# Patient Record
Sex: Female | Born: 1980 | Race: White | Hispanic: No | Smoking: Current every day smoker
Health system: Southern US, Community
[De-identification: ages and names within clinical notes are randomized; demographics above are authoritative.]

## PROBLEM LIST (undated history)

## (undated) DIAGNOSIS — E785 Hyperlipidemia, unspecified: Secondary | ICD-10-CM

## (undated) DIAGNOSIS — N920 Excessive and frequent menstruation with regular cycle: Secondary | ICD-10-CM

## (undated) DIAGNOSIS — T7840XA Allergy, unspecified, initial encounter: Secondary | ICD-10-CM

## (undated) DIAGNOSIS — F32A Depression, unspecified: Secondary | ICD-10-CM

## (undated) DIAGNOSIS — J45909 Unspecified asthma, uncomplicated: Secondary | ICD-10-CM

## (undated) DIAGNOSIS — K5792 Diverticulitis of intestine, part unspecified, without perforation or abscess without bleeding: Secondary | ICD-10-CM

## (undated) DIAGNOSIS — A630 Anogenital (venereal) warts: Secondary | ICD-10-CM

## (undated) DIAGNOSIS — F419 Anxiety disorder, unspecified: Secondary | ICD-10-CM

## (undated) DIAGNOSIS — F439 Reaction to severe stress, unspecified: Secondary | ICD-10-CM

## (undated) DIAGNOSIS — K649 Unspecified hemorrhoids: Secondary | ICD-10-CM

## (undated) DIAGNOSIS — Z713 Dietary counseling and surveillance: Secondary | ICD-10-CM

## (undated) DIAGNOSIS — N946 Dysmenorrhea, unspecified: Secondary | ICD-10-CM

## (undated) DIAGNOSIS — N83209 Unspecified ovarian cyst, unspecified side: Secondary | ICD-10-CM

## (undated) DIAGNOSIS — I10 Essential (primary) hypertension: Secondary | ICD-10-CM

## (undated) DIAGNOSIS — E669 Obesity, unspecified: Secondary | ICD-10-CM

## (undated) DIAGNOSIS — M62830 Muscle spasm of back: Secondary | ICD-10-CM

## (undated) DIAGNOSIS — Z6837 Body mass index (BMI) 37.0-37.9, adult: Secondary | ICD-10-CM

## (undated) DIAGNOSIS — S61219A Laceration without foreign body of unspecified finger without damage to nail, initial encounter: Secondary | ICD-10-CM

## (undated) DIAGNOSIS — K449 Diaphragmatic hernia without obstruction or gangrene: Secondary | ICD-10-CM

## (undated) DIAGNOSIS — Z8489 Family history of other specified conditions: Secondary | ICD-10-CM

## (undated) DIAGNOSIS — K219 Gastro-esophageal reflux disease without esophagitis: Secondary | ICD-10-CM

## (undated) DIAGNOSIS — M5431 Sciatica, right side: Secondary | ICD-10-CM

## (undated) DIAGNOSIS — F329 Major depressive disorder, single episode, unspecified: Secondary | ICD-10-CM

## (undated) DIAGNOSIS — Z6836 Body mass index (BMI) 36.0-36.9, adult: Secondary | ICD-10-CM

## (undated) HISTORY — DX: Unspecified ovarian cyst, unspecified side: N83.209

## (undated) HISTORY — DX: Excessive and frequent menstruation with regular cycle: N92.0

## (undated) HISTORY — DX: Dysmenorrhea, unspecified: N94.6

## (undated) HISTORY — DX: Muscle spasm of back: M62.830

## (undated) HISTORY — PX: TUBAL LIGATION: SHX77

## (undated) HISTORY — DX: Major depressive disorder, single episode, unspecified: F32.9

## (undated) HISTORY — DX: Dietary counseling and surveillance: Z71.3

## (undated) HISTORY — DX: Reaction to severe stress, unspecified: F43.9

## (undated) HISTORY — DX: Body mass index (bmi) 36.0-36.9, adult: Z68.36

## (undated) HISTORY — PX: ESOPHAGOGASTRODUODENOSCOPY ENDOSCOPY: SHX5814

## (undated) HISTORY — DX: Hyperlipidemia, unspecified: E78.5

## (undated) HISTORY — PX: COLONOSCOPY: SHX174

## (undated) HISTORY — DX: Gastro-esophageal reflux disease without esophagitis: K21.9

## (undated) HISTORY — DX: Allergy, unspecified, initial encounter: T78.40XA

## (undated) HISTORY — DX: Obesity, unspecified: E66.9

## (undated) HISTORY — DX: Anxiety disorder, unspecified: F41.9

## (undated) HISTORY — DX: Unspecified hemorrhoids: K64.9

## (undated) HISTORY — DX: Anogenital (venereal) warts: A63.0

## (undated) HISTORY — DX: Sciatica, right side: M54.31

## (undated) HISTORY — PX: ABLATION: SHX5711

## (undated) HISTORY — DX: Body mass index (bmi) 37.0-37.9, adult: Z68.37

## (undated) HISTORY — PX: CHOLECYSTECTOMY: SHX55

## (undated) HISTORY — DX: Depression, unspecified: F32.A

---

## 2003-08-16 DIAGNOSIS — J309 Allergic rhinitis, unspecified: Secondary | ICD-10-CM | POA: Insufficient documentation

## 2007-03-27 DIAGNOSIS — F3289 Other specified depressive episodes: Secondary | ICD-10-CM | POA: Insufficient documentation

## 2007-03-27 DIAGNOSIS — F329 Major depressive disorder, single episode, unspecified: Secondary | ICD-10-CM

## 2007-03-27 HISTORY — DX: Other specified depressive episodes: F32.89

## 2007-03-27 HISTORY — DX: Major depressive disorder, single episode, unspecified: F32.9

## 2011-04-09 DIAGNOSIS — E785 Hyperlipidemia, unspecified: Secondary | ICD-10-CM

## 2011-04-09 HISTORY — DX: Hyperlipidemia, unspecified: E78.5

## 2013-04-05 DIAGNOSIS — J4530 Mild persistent asthma, uncomplicated: Secondary | ICD-10-CM | POA: Insufficient documentation

## 2013-07-07 ENCOUNTER — Encounter (HOSPITAL_COMMUNITY): Payer: Self-pay | Admitting: Emergency Medicine

## 2013-07-07 ENCOUNTER — Emergency Department (HOSPITAL_COMMUNITY): Payer: Medicaid - Out of State

## 2013-07-07 ENCOUNTER — Emergency Department (HOSPITAL_COMMUNITY)
Admission: EM | Admit: 2013-07-07 | Discharge: 2013-07-07 | Disposition: A | Discharge status: Home / Self Care | Payer: Medicaid - Out of State | Attending: Emergency Medicine | Admitting: Emergency Medicine

## 2013-07-07 DIAGNOSIS — R0789 Other chest pain: Secondary | ICD-10-CM | POA: Insufficient documentation

## 2013-07-07 DIAGNOSIS — J029 Acute pharyngitis, unspecified: Secondary | ICD-10-CM | POA: Insufficient documentation

## 2013-07-07 DIAGNOSIS — R059 Cough, unspecified: Secondary | ICD-10-CM | POA: Insufficient documentation

## 2013-07-07 DIAGNOSIS — F17200 Nicotine dependence, unspecified, uncomplicated: Secondary | ICD-10-CM | POA: Insufficient documentation

## 2013-07-07 DIAGNOSIS — I10 Essential (primary) hypertension: Secondary | ICD-10-CM | POA: Insufficient documentation

## 2013-07-07 DIAGNOSIS — J3489 Other specified disorders of nose and nasal sinuses: Secondary | ICD-10-CM | POA: Insufficient documentation

## 2013-07-07 DIAGNOSIS — R05 Cough: Secondary | ICD-10-CM | POA: Insufficient documentation

## 2013-07-07 DIAGNOSIS — R5381 Other malaise: Secondary | ICD-10-CM | POA: Insufficient documentation

## 2013-07-07 DIAGNOSIS — R509 Fever, unspecified: Secondary | ICD-10-CM | POA: Insufficient documentation

## 2013-07-07 DIAGNOSIS — R6889 Other general symptoms and signs: Secondary | ICD-10-CM | POA: Insufficient documentation

## 2013-07-07 DIAGNOSIS — J45901 Unspecified asthma with (acute) exacerbation: Secondary | ICD-10-CM | POA: Insufficient documentation

## 2013-07-07 DIAGNOSIS — Z79899 Other long term (current) drug therapy: Secondary | ICD-10-CM | POA: Insufficient documentation

## 2013-07-07 HISTORY — DX: Essential (primary) hypertension: I10

## 2013-07-07 HISTORY — DX: Unspecified asthma, uncomplicated: J45.909

## 2013-07-07 MED ORDER — ALBUTEROL SULFATE (5 MG/ML) 0.5% IN NEBU
5.0000 mg | INHALATION_SOLUTION | Freq: Once | RESPIRATORY_TRACT | Status: AC
  Administered 2013-07-07: 5 mg via RESPIRATORY_TRACT
  Filled 2013-07-07: qty 1

## 2013-07-07 MED ORDER — ALBUTEROL SULFATE (5 MG/ML) 0.5% IN NEBU
10.0000 mg | INHALATION_SOLUTION | Freq: Once | RESPIRATORY_TRACT | Status: AC
  Administered 2013-07-07: 10 mg via RESPIRATORY_TRACT

## 2013-07-07 MED ORDER — ALBUTEROL SULFATE (5 MG/ML) 0.5% IN NEBU
INHALATION_SOLUTION | RESPIRATORY_TRACT | Status: AC
  Filled 2013-07-07: qty 2

## 2013-07-07 MED ORDER — ALBUTEROL SULFATE HFA 108 (90 BASE) MCG/ACT IN AERS: 2.0000 | INHALATION_SPRAY | Freq: Once | RESPIRATORY_TRACT | Status: DC

## 2013-07-07 MED ORDER — HYDROCOD POLST-CHLORPHEN POLST 10-8 MG/5ML PO LQCR
5.0000 mL | Freq: Once | ORAL | Status: AC
  Administered 2013-07-07: 5 mL via ORAL
  Filled 2013-07-07: qty 5

## 2013-07-07 MED ORDER — ALBUTEROL SULFATE HFA 108 (90 BASE) MCG/ACT IN AERS: 1.0000 | INHALATION_SPRAY | Freq: Four times a day (QID) | RESPIRATORY_TRACT | Status: DC | PRN

## 2013-07-07 MED ORDER — PREDNISONE 10 MG PO TABS: ORAL_TABLET | ORAL | Status: DC

## 2013-07-07 MED ORDER — PREDNISONE 50 MG PO TABS
60.0000 mg | ORAL_TABLET | Freq: Once | ORAL | Status: AC
  Administered 2013-07-07: 60 mg via ORAL
  Filled 2013-07-07: qty 1

## 2013-07-07 MED ORDER — IPRATROPIUM BROMIDE 0.02 % IN SOLN
0.5000 mg | Freq: Once | RESPIRATORY_TRACT | Status: AC
  Administered 2013-07-07: 0.5 mg via RESPIRATORY_TRACT
  Filled 2013-07-07: qty 2.5

## 2013-07-07 MED ORDER — HYDROCODONE-ACETAMINOPHEN 5-325 MG PO TABS: 1.0000 | ORAL_TABLET | ORAL | Status: DC | PRN

## 2013-07-07 NOTE — ED Notes (Signed)
Pt c/o sob and dry cough x 3-4 days now. Chills/fever yesterday. Nad. Insp/exp wheezing noted throughout. No resp distress noted.

## 2013-07-07 NOTE — ED Notes (Signed)
O2 sat with ambulation 96%.

## 2013-07-07 NOTE — ED Provider Notes (Signed)
CSN: [id number]     Arrival date & time 07/07/13  1613 History     First MD Initiated Contact with Patient 07/07/13 1628     Chief Complaint  Patient presents with  . Shortness of Breath   (Consider location/radiation/quality/duration/timing/severity/associated sxs/prior Treatment) HPI Comments: Destiny Petty is a 32 y.o. Female with a 3 day history of increased wheezing,  Shortness of breath,  Fever to 102.1 last night which improved after taking dayquill,  And cough which has been productive of a clear to sometimes yellow tinged sputum.  She has a history of asthma for which she uses daily qvar and another inhaler she cannot name.  She haas moved here from [place] and has not yet established pcp.  Her symptoms started with uri type symptoms including nasal congestion with clear rhinorrhea and mild sore throat and sneezing.  She is a smoker but has only been able to smoke 1 cigarette per day since she became ill.     The history is provided by the patient.    Past Medical History  Diagnosis Date  . Asthma   . Hypertension    Past Surgical History  Procedure Laterality Date  . Cesarean section    . Tubal ligation     History reviewed. No pertinent family history. History  Substance Use Topics  . Smoking status: Current Every Day Smoker  . Smokeless tobacco: Not on file  . Alcohol Use: No   OB History   Grav Para Term Preterm Abortions TAB SAB Ect Mult Living                 Review of Systems  Constitutional: Positive for fever, chills and fatigue.  HENT: Positive for congestion, sore throat and rhinorrhea. Negative for ear pain, neck pain and sinus pressure.   Eyes: Negative.   Respiratory: Positive for cough, chest tightness, shortness of breath and wheezing.   Cardiovascular: Negative for palpitations and leg swelling.  Gastrointestinal: Negative for nausea and abdominal pain.  Genitourinary: Negative.   Musculoskeletal: Negative for joint swelling and arthralgias.   Skin: Negative.  Negative for rash and wound.  Neurological: Negative for dizziness, weakness, light-headedness, numbness and headaches.  Psychiatric/Behavioral: Negative.     Allergies  Lisinopril  Home Medications   Current Outpatient Rx  Name  Route  Sig  Dispense  Refill  . albuterol (PROVENTIL HFA;VENTOLIN HFA) 108 (90 BASE) MCG/ACT inhaler   Inhalation   Inhale 1-2 puffs into the lungs every 6 (six) hours as needed for wheezing.   1 Inhaler   0   . HYDROcodone-acetaminophen (NORCO/VICODIN) 5-325 MG per tablet   Oral   Take 1 tablet by mouth every 4 (four) hours as needed (for cough suppression).   15 tablet   0   . predniSONE (DELTASONE) 10 MG tablet      6, 5, 4, 3, 2 then 1 tablet by mouth daily for 6 days total.   21 tablet   0    BP 114/89  Pulse 101  Temp(Src) 97.8 F (36.6 C) (Oral)  Resp 20  Ht 5\' 2"  (1.575 m)  Wt 240 lb (108.863 kg)  BMI 43.89 kg/m2  SpO2 100%  LMP 07/07/2013 Physical Exam  Nursing note and vitals reviewed. Constitutional: She is oriented to person, place, and time. She appears well-developed and well-nourished. No distress.  HENT:  Head: Normocephalic and atraumatic.  Eyes: Conjunctivae are normal.  Neck: Normal range of motion.  Cardiovascular: Normal rate, regular rhythm, normal heart  sounds and intact distal pulses.   Pulmonary/Chest: Effort normal. No respiratory distress. She has wheezes.  Inspiratory and expiratory wheeze throughout all lung fields.  Prolonged respirations.  Abdominal: Soft. Bowel sounds are normal. There is no tenderness. There is no rebound and no guarding.  Musculoskeletal: Normal range of motion.  Neurological: She is alert and oriented to person, place, and time.  Skin: Skin is warm and dry.  Psychiatric: She has a normal mood and affect.    ED Course   Procedures (including critical care time)   Medications  albuterol (PROVENTIL) (5 MG/ML) 0.5% nebulizer solution 5 mg (5 mg Nebulization  Given 07/07/13 1633)  ipratropium (ATROVENT) nebulizer solution 0.5 mg (0.5 mg Nebulization Given 07/07/13 1633)  predniSONE (DELTASONE) tablet 60 mg (60 mg Oral Given 07/07/13 1643)  albuterol (PROVENTIL) (5 MG/ML) 0.5% nebulizer solution 10 mg (10 mg Nebulization Given 07/07/13 1646)  albuterol (PROVENTIL) (5 MG/ML) 0.5% nebulizer solution (  Duplicate 07/07/13 1651)  chlorpheniramine-HYDROcodone (TUSSIONEX) 10-8 MG/5ML suspension 5 mL (5 mLs Oral Given 07/07/13 1805)   Pt was given albuterol 5 mg/atrovent 0.5 mg neb without improvement in aeration and wheezing. Prednisone 60 mg po also given.  Repeated albuterol 10 mg over 1 hour.  Pt with significant improvement after this second dose of neb albuterol - she ambulated in ed without sob or desaturation.  Wheezing almost resolved with increased aeration throughout all lung fields.     Labs Reviewed - No data to display Dg Chest 2 View  07/07/2013   *RADIOLOGY REPORT*  Clinical Data: Shortness of breath.  CHEST - 2 VIEW  Comparison: None.  Findings: Lungs are somewhat hypoinflated without consolidation or effusion.  The cardiomediastinal silhouette, bones and soft tissues are unremarkable.  IMPRESSION: No acute cardiopulmonary disease.   Original Report Authenticated By: Elberta Fortis, M.D.   1. Asthma attack     MDM  Wheezing almost resolved with increased aeration throughout all lung fields.  She felt comfortable  for dc home. She was advised return here immediately for any worsened sx.  Prescribed albuterol mdi,  Prednisone 6 day taper with next dose taken tomorrow.  Also prescribed hydrocodone for cough relief, was given dose of tussionex in ed with improvement in cough, but this is not covered by her insurance.   Patients labs and/or radiological studies were viewed and considered during the medical decision making and disposition process.  No pneumonia per xray - no indication for abx needed at this time.     [name], PA-C 07/08/13 0106

## 2013-07-07 NOTE — ED Notes (Signed)
Pt seen and evaluated by EDPa for initial assessment. 

## 2013-07-08 ENCOUNTER — Encounter (HOSPITAL_COMMUNITY): Payer: Self-pay

## 2013-07-08 ENCOUNTER — Inpatient Hospital Stay (HOSPITAL_COMMUNITY)
Admission: EM | Admit: 2013-07-08 | Discharge: 2013-07-10 | DRG: 203 | Disposition: A | Discharge status: Home / Self Care | Payer: Medicaid Other | Attending: Internal Medicine | Admitting: Internal Medicine

## 2013-07-08 DIAGNOSIS — J4541 Moderate persistent asthma with (acute) exacerbation: Secondary | ICD-10-CM

## 2013-07-08 DIAGNOSIS — R0602 Shortness of breath: Secondary | ICD-10-CM | POA: Diagnosis present

## 2013-07-08 DIAGNOSIS — Z23 Encounter for immunization: Secondary | ICD-10-CM

## 2013-07-08 DIAGNOSIS — Z720 Tobacco use: Secondary | ICD-10-CM | POA: Diagnosis present

## 2013-07-08 DIAGNOSIS — J45901 Unspecified asthma with (acute) exacerbation: Principal | ICD-10-CM | POA: Diagnosis present

## 2013-07-08 DIAGNOSIS — I10 Essential (primary) hypertension: Secondary | ICD-10-CM | POA: Diagnosis present

## 2013-07-08 DIAGNOSIS — J45902 Unspecified asthma with status asthmaticus: Secondary | ICD-10-CM

## 2013-07-08 DIAGNOSIS — K219 Gastro-esophageal reflux disease without esophagitis: Secondary | ICD-10-CM | POA: Diagnosis present

## 2013-07-08 DIAGNOSIS — F17200 Nicotine dependence, unspecified, uncomplicated: Secondary | ICD-10-CM | POA: Diagnosis present

## 2013-07-08 LAB — CBC WITH DIFFERENTIAL/PLATELET
Basophils Absolute: 0 10*3/uL (ref 0.0–0.1)
Basophils Relative: 0 % (ref 0–1)
Eosinophils Absolute: 0 10*3/uL (ref 0.0–0.7)
Eosinophils Relative: 0 % (ref 0–5)
HCT: 42.1 % (ref 36.0–46.0)
Hemoglobin: 14.5 g/dL (ref 12.0–15.0)
Lymphocytes Relative: 9 % — ABNORMAL LOW (ref 12–46)
Lymphs Abs: 1 10*3/uL (ref 0.7–4.0)
MCH: 32.7 pg (ref 26.0–34.0)
MCHC: 34.4 g/dL (ref 30.0–36.0)
MCV: 94.8 fL (ref 78.0–100.0)
Monocytes Absolute: 0.4 10*3/uL (ref 0.1–1.0)
Monocytes Relative: 3 % (ref 3–12)
Neutro Abs: 10.4 10*3/uL — ABNORMAL HIGH (ref 1.7–7.7)
Neutrophils Relative %: 88 % — ABNORMAL HIGH (ref 43–77)
Platelets: 224 10*3/uL (ref 150–400)
RBC: 4.44 MIL/uL (ref 3.87–5.11)
RDW: 12.9 % (ref 11.5–15.5)
WBC: 11.8 10*3/uL — ABNORMAL HIGH (ref 4.0–10.5)

## 2013-07-08 LAB — BASIC METABOLIC PANEL
BUN: 13 mg/dL (ref 6–23)
CO2: 19 mEq/L (ref 19–32)
Calcium: 9.1 mg/dL (ref 8.4–10.5)
Chloride: 107 mEq/L (ref 96–112)
Creatinine, Ser: 0.65 mg/dL (ref 0.50–1.10)
GFR calc Af Amer: >90 mL/min (ref >90)
GFR calc non Af Amer: >90 mL/min (ref >90)
Glucose, Bld: 113 mg/dL — ABNORMAL HIGH (ref 70–99)
Potassium: 3.4 mEq/L — ABNORMAL LOW (ref 3.5–5.1)
Sodium: 139 mEq/L (ref 135–145)

## 2013-07-08 MED ORDER — FLUTICASONE PROPIONATE 50 MCG/ACT NA SUSP
NASAL | Status: AC
  Filled 2013-07-08: qty 16

## 2013-07-08 MED ORDER — IPRATROPIUM BROMIDE 0.02 % IN SOLN
0.5000 mg | RESPIRATORY_TRACT | Status: DC
  Administered 2013-07-08 – 2013-07-10 (×9): 0.5 mg via RESPIRATORY_TRACT
  Filled 2013-07-08 (×9): qty 2.5

## 2013-07-08 MED ORDER — ALBUTEROL (5 MG/ML) CONTINUOUS INHALATION SOLN
15.0000 mg/h | INHALATION_SOLUTION | RESPIRATORY_TRACT | Status: DC
  Administered 2013-07-08: 15 mg/h via RESPIRATORY_TRACT
  Filled 2013-07-08: qty 20

## 2013-07-08 MED ORDER — PREDNISONE 20 MG PO TABS
50.0000 mg | ORAL_TABLET | Freq: Every day | ORAL | Status: DC
  Administered 2013-07-09: 50 mg via ORAL
  Filled 2013-07-08: qty 2

## 2013-07-08 MED ORDER — ALBUTEROL SULFATE (5 MG/ML) 0.5% IN NEBU
2.5000 mg | INHALATION_SOLUTION | RESPIRATORY_TRACT | Status: DC
  Administered 2013-07-08 – 2013-07-10 (×9): 2.5 mg via RESPIRATORY_TRACT
  Filled 2013-07-08 (×7): qty 0.5

## 2013-07-08 MED ORDER — PREDNISONE 50 MG PO TABS
60.0000 mg | ORAL_TABLET | Freq: Once | ORAL | Status: AC
  Administered 2013-07-08: 60 mg via ORAL
  Filled 2013-07-08: qty 1

## 2013-07-08 MED ORDER — SODIUM CHLORIDE 0.9 % IV SOLN
INTRAVENOUS | Status: DC
  Administered 2013-07-08: 21:00:00 via INTRAVENOUS

## 2013-07-08 MED ORDER — ACETAMINOPHEN 325 MG PO TABS
650.0000 mg | ORAL_TABLET | Freq: Four times a day (QID) | ORAL | Status: DC | PRN
  Administered 2013-07-08 – 2013-07-09 (×4): 650 mg via ORAL
  Filled 2013-07-08 (×4): qty 2

## 2013-07-08 MED ORDER — ALBUTEROL SULFATE (5 MG/ML) 0.5% IN NEBU
5.0000 mg | INHALATION_SOLUTION | Freq: Once | RESPIRATORY_TRACT | Status: AC
  Administered 2013-07-08: 5 mg via RESPIRATORY_TRACT
  Filled 2013-07-08: qty 1

## 2013-07-08 MED ORDER — ENOXAPARIN SODIUM 40 MG/0.4ML ~~LOC~~ SOLN
40.0000 mg | SUBCUTANEOUS | Status: DC
  Administered 2013-07-08 – 2013-07-09 (×2): 40 mg via SUBCUTANEOUS
  Filled 2013-07-08 (×2): qty 0.4

## 2013-07-08 MED ORDER — IPRATROPIUM BROMIDE 0.02 % IN SOLN
1.0000 mg | Freq: Once | RESPIRATORY_TRACT | Status: AC
  Administered 2013-07-08: 1 mg via RESPIRATORY_TRACT
  Filled 2013-07-08: qty 5

## 2013-07-08 MED ORDER — DM-GUAIFENESIN ER 30-600 MG PO TB12
1.0000 | ORAL_TABLET | Freq: Two times a day (BID) | ORAL | Status: DC
  Administered 2013-07-08: 1 via ORAL
  Filled 2013-07-08: qty 1

## 2013-07-08 MED ORDER — FLUTICASONE PROPIONATE 50 MCG/ACT NA SUSP
2.0000 | Freq: Every day | NASAL | Status: DC | PRN
Start: 2013-07-08 — End: 2013-07-10
  Filled 2013-07-08: qty 16

## 2013-07-08 MED ORDER — PNEUMOCOCCAL VAC POLYVALENT 25 MCG/0.5ML IJ INJ
0.5000 mL | INJECTION | INTRAMUSCULAR | Status: AC
  Administered 2013-07-09: 0.5 mL via INTRAMUSCULAR
  Filled 2013-07-08: qty 0.5

## 2013-07-08 MED ORDER — ALBUTEROL SULFATE (5 MG/ML) 0.5% IN NEBU: 2.5000 mg | INHALATION_SOLUTION | RESPIRATORY_TRACT | Status: DC | PRN

## 2013-07-08 MED ORDER — LOSARTAN POTASSIUM 50 MG PO TABS
100.0000 mg | ORAL_TABLET | Freq: Every day | ORAL | Status: DC
  Administered 2013-07-08 – 2013-07-10 (×3): 100 mg via ORAL
  Filled 2013-07-08: qty 2
  Filled 2013-07-08 (×2): qty 1
  Filled 2013-07-08: qty 2

## 2013-07-08 NOTE — ED Notes (Signed)
Pt reports was seen here last night for asthma.  Pt says was told she could be admitted but pt didn't want to stay because she had to work today.  Pt reports became very sob at work and had to leave.  Pt has been SOb x 4 days and nonproductive cough.  Pt has been using inhaler without relief.

## 2013-07-08 NOTE — ED Notes (Signed)
While ambulating in hallway pt states breathing became more difficult. Pt's O2 sat dropped to 89%. Dr. Juleen China witnessed episode.  Pt has agreed to stay in hospital.

## 2013-07-08 NOTE — H&P (Signed)
PCP:   No primary provider on file.   Chief Complaint:  Shortness of breath  HPI:  32 -year-old female with a history of asthma came to the ED with worsening of shortness of breath. Patient was seen in the ED yesterday and was sent home today came to the hospital as patient could not breathe at work she was getting short of breath on walking. Also complains of cough but no phlegm. Patient had fever of 102 2 days ago. Chest x-ray done yesterday was negative for pneumonia. She denies fever today. She has had some diarrhea but no nausea vomiting. Does complain of some abdominal pain due to coughing. Denies dysuria urgency or frequency of urination  Allergies:   Allergies  Allergen Reactions  . Lisinopril Other (See Comments)    Cough      Past Medical History  Diagnosis Date  . Asthma   . Hypertension     Past Surgical History  Procedure Laterality Date  . Cesarean section    . Tubal ligation      Prior to Admission medications   Medication Sig Start Date End Date Taking? Authorizing Provider  albuterol (PROVENTIL HFA;VENTOLIN HFA) 108 (90 BASE) MCG/ACT inhaler Inhale 1-2 puffs into the lungs every 6 (six) hours as needed for wheezing. 07/07/13  Yes Raynelle Fanning Idol, PA-C  fluticasone (FLONASE) 50 MCG/ACT nasal spray Place 2 sprays into the nose daily as needed for rhinitis.   Yes Historical Provider, MD  losartan (COZAAR) 100 MG tablet Take 100 mg by mouth daily.   Yes Historical Provider, MD    Social History:  reports that she has been smoking.  She does not have any smokeless tobacco history on file. She reports that she does not drink alcohol or use illicit drugs.   family history: Noncontributory  All the positives are listed in BOLD  Review of Systems:  HEENT: Headache, blurred vision, runny nose, sore throat Neck: Hypothyroidism, hyperthyroidism,,lymphadenopathy Chest : Shortness of breath, history of COPD, Asthma Heart : Chest pain, history of coronary arterey disease,  hypertension  GI:  Nausea, vomiting, diarrhea, constipation, GERD GU: Dysuria, urgency, frequency of urination, hematuria Neuro: Stroke, seizures, syncope Psych: Depression, anxiety, hallucinations   Physical Exam: Blood pressure 145/74, pulse 82, temperature 97.6 F (36.4 C), temperature source Oral, resp. rate 20, height 5\' 2"  (1.575 m), weight 108.863 kg (240 lb), last menstrual period 07/07/2013, SpO2 94.00%. Constitutional:   Patient is a well-developed and well-nourished female* in no acute distress and cooperative with exam. Head: Normocephalic and atraumatic Mouth: Mucus membranes moist Eyes: PERRL, EOMI, conjunctivae normal Neck: Supple, No Thyromegaly Cardiovascular: RRR, S1 normal, S2 normal Pulmonary/Chest: bilateral wheezing auscultated  Abdominal: Soft. Non-tender, non-distended, bowel sounds are normal, no masses, organomegaly, or guarding present.  Neurological: A&O x3, Strenght is normal and symmetric bilaterally, cranial nerve II-XII are grossly intact, no focal motor deficit, sensory intact to light touch bilaterally.  Extremities : No Cyanosis, Clubbing or Edema   Labs on Admission:  No results found for this or any previous visit (from the past 48 hour(s)).  Radiological Exams on Admission: Dg Chest 2 View  07/07/2013   *RADIOLOGY REPORT*  Clinical Data: Shortness of breath.  CHEST - 2 VIEW  Comparison: None.  Findings: Lungs are somewhat hypoinflated without consolidation or effusion.  The cardiomediastinal silhouette, bones and soft tissues are unremarkable.  IMPRESSION: No acute cardiopulmonary disease.   Original Report Authenticated By: Elberta Fortis, M.D.    Assessment/Plan Active Problems:   Asthma exacerbation  HTN (hypertension)  Asthma exacerbation Patient has received prednisone 60 mg in the ED. Will start prednisone 50 mg from tomorrow morning. DuoNeb nebulizers every 4 hours scheduled. Albuterol when necessary  q. 2Hours when  necessary  Hypertension Continue losartan  DVT prophylaxis Lovenox  Code status: Full code  Family discussion: no family at bedside   Time Spent on Admission: 65 min Alverto Shedd S [facility] Pager: [phone] 07/08/2013, 7:25 PM  If 7PM-7AM, please contact night-coverage  [email or web address]  Password [email or web address]

## 2013-07-08 NOTE — ED Notes (Signed)
Pt c/o difficulty breathing and has a hx of asthma. Pt was seen here yesterday and was recommended to stay in hopsital overnight due to asthma exacerbation. Pt was unable to stay due to work. However, pt is back today due to worsening symptoms which began while at work. Pt states "my breathing got so bad I though I was going to pass out".

## 2013-07-08 NOTE — ED Provider Notes (Signed)
CSN: [id number]     Arrival date & time 07/08/13  1505 History  This chart was scribed for [name], MD by Bennett Scrape, ED Scribe. This patient was seen in room [facility]15[facility] and the patient's care was started at 3:32 PM.   First MD Initiated Contact with Patient 07/08/13 1523     Chief Complaint  Patient presents with  . Shortness of Breath    The history is provided by the patient. No language interpreter was used.    HPI Comments: Destiny Petty is a 32 y.o. female with a h/o asthma who presents to the Emergency Department complaining of 4 days of gradual onset, gradually worsening SOB described as "gasping for air" with associated cough, chest tightness and wheezing. that started this morning. She was seen last night for the same and discharged with improved symptoms after a breathing treatment. CXR performed yesterday was negative. She states that admission was discussed, but she declined due to starting a new job. She reports that she is here for evaluation, because the symptoms returned this morning. She has a fever of 102.1 yesterday, but temperature in the ED today is normal. She reports that she has tried using her inhaler with no improvement. She denies having a nebulizer at home. She was prescribed steroids and antibiotics yesterday and states that she has filled them but has not taken any doses yet. She denies CP, leg swelling or pain as associated symptoms. She denies having a h/o PE/DVTs.  Past Medical History  Diagnosis Date  . Asthma   . Hypertension    Past Surgical History  Procedure Laterality Date  . Cesarean section    . Tubal ligation     No family history on file. History  Substance Use Topics  . Smoking status: Current Every Day Smoker  . Smokeless tobacco: Not on file  . Alcohol Use: No   No OB history provided.  Review of Systems  Constitutional: Negative for fever.  HENT: Positive for sore throat (started today from coughing). Negative for ear  pain.   Respiratory: Positive for cough, chest tightness, shortness of breath and wheezing.   Cardiovascular: Negative for chest pain and leg swelling.  All other systems reviewed and are negative.    Allergies  Lisinopril  Home Medications   Current Outpatient Rx  Name  Route  Sig  Dispense  Refill  . albuterol (PROVENTIL HFA;VENTOLIN HFA) 108 (90 BASE) MCG/ACT inhaler   Inhalation   Inhale 1-2 puffs into the lungs every 6 (six) hours as needed for wheezing.   1 Inhaler   0   . fluticasone (FLONASE) 50 MCG/ACT nasal spray   Nasal   Place 2 sprays into the nose daily as needed for rhinitis.          Triage Vitals: BP 145/74  Pulse 82  Temp(Src) 97.6 F (36.4 C) (Oral)  Resp 20  Ht 5\' 2"  (1.575 m)  Wt 240 lb (108.863 kg)  BMI 43.89 kg/m2  SpO2 96%  LMP 07/07/2013  Physical Exam  Nursing note and vitals reviewed. Constitutional: She is oriented to person, place, and time. She appears well-developed and well-nourished. No distress.  HENT:  Head: Normocephalic and atraumatic.  Eyes: Conjunctivae and EOM are normal.  Neck: Normal range of motion. Neck supple. No tracheal deviation present.  Cardiovascular: Normal rate, regular rhythm and normal heart sounds.   No murmur heard. Pulmonary/Chest: Effort normal. No respiratory distress. She has wheezes (diffuse expiratory wheezing). She has no rales.  Coarse sounding cough, mild tachypnea  Abdominal: Soft. Bowel sounds are normal. There is no tenderness.  Musculoskeletal: Normal range of motion. She exhibits no edema (no ankle swelling) and no tenderness (no calf tenderness).  Neurological: She is alert and oriented to person, place, and time. No cranial nerve deficit.  Skin: Skin is warm and dry.  Psychiatric: She has a normal mood and affect. Her behavior is normal.    ED Course   Procedures (including critical care time)  Medications  albuterol (PROVENTIL,VENTOLIN) solution continuous neb (15 mg/hr Nebulization  New Bag/Given 07/08/13 1549)  ipratropium (ATROVENT) nebulizer solution 1 mg (1 mg Nebulization Given 07/08/13 1550)  predniSONE (DELTASONE) tablet 60 mg (60 mg Oral Given 07/08/13 1541)  albuterol (PROVENTIL) (5 MG/ML) 0.5% nebulizer solution 5 mg (5 mg Nebulization Given 07/08/13 1840)    DIAGNOSTIC STUDIES: Oxygen Saturation is 96% on room air, normal by my interpretation.    COORDINATION OF CARE: 3:39 PM-Discussed treatment plan which includes breathing treatement with pt at bedside and pt agreed to plan.   Labs Reviewed - No data to display  Dg Chest 2 View  07/07/2013   *RADIOLOGY REPORT*  Clinical Data: Shortness of breath.  CHEST - 2 VIEW  Comparison: None.  Findings: Lungs are somewhat hypoinflated without consolidation or effusion.  The cardiomediastinal silhouette, bones and soft tissues are unremarkable.  IMPRESSION: No acute cardiopulmonary disease.   Original Report Authenticated By: Elberta Fortis, M.D.   1. Status asthmaticus, unspecified asthma severity     MDM  31yF with acute bronchospasm. Continued significant symptoms and desat to 89% when ambulated after 15mg  albuterol and 1mg  atrovent. Repeat visit for same symptoms and has already had dose of steroids yesterday. CXR from yesterday reviewed. No acute process. W/o acute change in symptoms, repeat CXR was deferred. Given steroids. More nebs.  Will obtain basic labs. Admit for further tx.   I personally preformed the services scribed in my presence. The recorded information has been reviewed is accurate. [name], MD.    [name], MD 07/13/13 [phone]

## 2013-07-09 DIAGNOSIS — I10 Essential (primary) hypertension: Secondary | ICD-10-CM

## 2013-07-09 DIAGNOSIS — J45902 Unspecified asthma with status asthmaticus: Secondary | ICD-10-CM

## 2013-07-09 LAB — CBC
MCHC: 33.7 g/dL (ref 30.0–36.0)
Platelets: 237 10*3/uL (ref 150–400)
RDW: 13 % (ref 11.5–15.5)

## 2013-07-09 LAB — COMPREHENSIVE METABOLIC PANEL
ALT: 13 U/L (ref 0–35)
Albumin: 3.4 g/dL — ABNORMAL LOW (ref 3.5–5.2)
Alkaline Phosphatase: 84 U/L (ref 39–117)
Potassium: 4.1 mEq/L (ref 3.5–5.1)
Sodium: 140 mEq/L (ref 135–145)
Total Protein: 6.9 g/dL (ref 6.0–8.3)

## 2013-07-09 MED ORDER — GUAIFENESIN-DM 100-10 MG/5ML PO SYRP
5.0000 mL | ORAL_SOLUTION | ORAL | Status: DC | PRN
  Administered 2013-07-09 – 2013-07-10 (×5): 5 mL via ORAL
  Filled 2013-07-09 (×5): qty 5

## 2013-07-09 MED ORDER — BENZONATATE 100 MG PO CAPS
200.0000 mg | ORAL_CAPSULE | Freq: Three times a day (TID) | ORAL | Status: DC
  Administered 2013-07-09 – 2013-07-10 (×3): 200 mg via ORAL
  Filled 2013-07-09: qty 2
  Filled 2013-07-09: qty 1
  Filled 2013-07-09 (×2): qty 2

## 2013-07-09 MED ORDER — IBUPROFEN 800 MG PO TABS
800.0000 mg | ORAL_TABLET | Freq: Three times a day (TID) | ORAL | Status: DC | PRN
  Administered 2013-07-09 – 2013-07-10 (×2): 800 mg via ORAL
  Filled 2013-07-09 (×2): qty 1

## 2013-07-09 MED ORDER — METHYLPREDNISOLONE SODIUM SUCC 125 MG IJ SOLR
60.0000 mg | Freq: Three times a day (TID) | INTRAMUSCULAR | Status: DC
  Administered 2013-07-09 – 2013-07-10 (×3): 60 mg via INTRAVENOUS
  Filled 2013-07-09 (×3): qty 2

## 2013-07-09 NOTE — Progress Notes (Signed)
UR Chart Review Completed  

## 2013-07-09 NOTE — Progress Notes (Signed)
Notified Dr. Kerry Hough due to the pts c/o a cough that has not been relieved by the ordered meds.  New meds ordered and followed.

## 2013-07-09 NOTE — Care Management Note (Unsigned)
    Page 1 of 1   07/09/2013     2:12:15 PM   CARE MANAGEMENT NOTE 07/09/2013  Patient:  [facility]   Account Number:  [id number]  Date Initiated:  07/09/2013  Documentation initiated by:  Sharrie Rothman  Subjective/Objective Assessment:   Pt admitted from home with asthma. Pt just relocated to [place] from [place] and is living with her sister. Pt needs PCP once Medicaid has been moved from [place] to [place].     Action/Plan:   CM gave pt list of PCP and numbers to call once Medicaid has been transferred.Will continue to follow for CM needs.   Anticipated DC Date:  07/12/2013   Anticipated DC Plan:  HOME/SELF CARE      DC Planning Services  CM consult      Choice offered to / List presented to:             Status of service:  Completed, signed off Medicare Important Message given?   (If response is "NO", the following Medicare IM given date fields will be blank) Date Medicare IM given:   Date Additional Medicare IM given:    Discharge Disposition:    Per UR Regulation:    If discussed at Long Length of Stay Meetings, dates discussed:    Comments:  07/09/13 1410 Arlyss Queen, RN BSN CM

## 2013-07-09 NOTE — Progress Notes (Signed)
Notified Dr. Kerry Hough that the patient had been ambulated approx 250 feet (her room and around the nursing station) and stated that she was slightly weak and still wheezing mildly.  I asked her if she felt well enough to go home and she said she did. She verbalized that she wanted to go home. MD verbalized understanding.  I will continue to monitor her until she goes.

## 2013-07-09 NOTE — Progress Notes (Signed)
[facility] PROGRESS NOTE  Destiny Petty HQI:[id number] DOB: September 17, 1981 DOA: 07/08/2013 PCP: No primary provider on file.  Assessment/Plan: 1. Asthma exacerbation.  Patient has improved since admission, but continues to wheeze and does not feel that she is ready to discharge home yet.  She becomes increasingly short of breath after ambulation.  Will change steroids to iv while she is in hospital and continue nebs.  Continue current treatments.  She will need formal pfts in the outpatient setting once her exacerbation has resolved. 2. Htn, stable 3. Tobacco abuse. She smokes 1 ppd.  She was extensively counseled on the importance of tobacco cessation.  Code Status: full code Family Communication: discussed with patient Disposition Plan: discharge home, probable tomorrow   Consultants:  none  Procedures:  none  Antibiotics:  none  HPI/Subjective: Feeling better than she did on admission, but still wheezing and short of breath  Objective: Filed Vitals:   07/09/13 0204 07/09/13 0615 07/09/13 0659 07/09/13 0758  BP:  126/58    Pulse:  65    Temp:  97.4 F (36.3 C)    TempSrc:  Oral    Resp:  18    Height:      Weight:      SpO2: 93% 96% 96% 96%   No intake or output data in the 24 hours ending 07/09/13 1140 Filed Weights   07/08/13 1511 07/08/13 2100  Weight: 108.863 kg (240 lb) 112.1 kg (247 lb 2.2 oz)    Exam:   General:  NAD, obese  Cardiovascular: s1, s2, rrr  Respiratory: bilateral exp wheeze  Abdomen: soft, nt, bs+  Musculoskeletal: no edema b/l   Data Reviewed: Basic Metabolic Panel:  Recent Labs Lab 07/08/13 1925 07/09/13 0514  NA 139 140  K 3.4* 4.1  CL 107 109  CO2 19 21  GLUCOSE 113* 110*  BUN 13 13  CREATININE 0.65 0.62  CALCIUM 9.1 9.0   Liver Function Tests:  Recent Labs Lab 07/09/13 0514  AST 14  ALT 13  ALKPHOS 84  BILITOT 0.1*  PROT 6.9  ALBUMIN 3.4*   No results found for this basename: LIPASE, AMYLASE,  in  the last 168 hours No results found for this basename: AMMONIA,  in the last 168 hours CBC:  Recent Labs Lab 07/08/13 1925 07/09/13 0514  WBC 11.8* 11.0*  NEUTROABS 10.4*  --   HGB 14.5 13.8  HCT 42.1 40.9  MCV 94.8 96.0  PLT 224 237   Cardiac Enzymes: No results found for this basename: CKTOTAL, CKMB, CKMBINDEX, TROPONINI,  in the last 168 hours BNP (last 3 results) No results found for this basename: PROBNP,  in the last 8760 hours CBG: No results found for this basename: GLUCAP,  in the last 168 hours  No results found for this or any previous visit (from the past 240 hour(s)).   Studies: Dg Chest 2 View  07/07/2013   *RADIOLOGY REPORT*  Clinical Data: Shortness of breath.  CHEST - 2 VIEW  Comparison: None.  Findings: Lungs are somewhat hypoinflated without consolidation or effusion.  The cardiomediastinal silhouette, bones and soft tissues are unremarkable.  IMPRESSION: No acute cardiopulmonary disease.   Original Report Authenticated By: Elberta Fortis, M.D.    Scheduled Meds: . albuterol  2.5 mg Nebulization Q4H  . enoxaparin (LOVENOX) injection  40 mg Subcutaneous Q24H  . ipratropium  0.5 mg Nebulization Q4H  . losartan  100 mg Oral Daily  . methylPREDNISolone (SOLU-MEDROL) injection  60 mg Intravenous Q8H  .  pneumococcal 23 valent vaccine  0.5 mL Intramuscular Tomorrow-1000   Continuous Infusions:   Active Problems:   Asthma exacerbation   HTN (hypertension)    Time spent:    MEMON,JEHANZEB  [facility] Pager [phone]. If 7PM-7AM, please contact night-coverage at [email or web address], password [email or web address] 07/09/2013, 11:40 AM  LOS: 1 day

## 2013-07-10 DIAGNOSIS — F17200 Nicotine dependence, unspecified, uncomplicated: Secondary | ICD-10-CM

## 2013-07-10 DIAGNOSIS — Z720 Tobacco use: Secondary | ICD-10-CM | POA: Diagnosis present

## 2013-07-10 DIAGNOSIS — K219 Gastro-esophageal reflux disease without esophagitis: Secondary | ICD-10-CM

## 2013-07-10 MED ORDER — VARENICLINE TARTRATE 0.5 MG PO TABS: ORAL_TABLET | ORAL | Status: DC

## 2013-07-10 MED ORDER — BENZONATATE 200 MG PO CAPS: 200.0000 mg | ORAL_CAPSULE | Freq: Three times a day (TID) | ORAL | Status: DC

## 2013-07-10 MED ORDER — FLUTICASONE PROPIONATE HFA 110 MCG/ACT IN AERO: 1.0000 | INHALATION_SPRAY | Freq: Two times a day (BID) | RESPIRATORY_TRACT | Status: DC

## 2013-07-10 MED ORDER — PREDNISONE 10 MG PO TABS: ORAL_TABLET | ORAL | Status: DC

## 2013-07-10 MED ORDER — OMEPRAZOLE 20 MG PO CPDR: 20.0000 mg | DELAYED_RELEASE_CAPSULE | Freq: Every day | ORAL | Status: DC

## 2013-07-10 NOTE — Discharge Summary (Signed)
Physician Discharge Summary  Destiny Petty NWG:[id number] DOB: [birth year] DOA: 07/08/2013  PCP: No primary provider on file.  Admit date: 07/08/2013 Discharge date: 07/10/2013  Time spent: 40 minutes  Recommendations for Outpatient Follow-up:  1. Follow up with primary care doctor in 2 weeks  Discharge Diagnoses:  Active Problems:   Asthma exacerbation   HTN (hypertension)   Tobacco use disorder   GERD (gastroesophageal reflux disease)   Discharge Condition:improved  Diet recommendation: low salt  Filed Weights   07/08/13 1511 07/08/13 2100  Weight: 108.863 kg (240 lb) 112.1 kg (247 lb 2.2 oz)    History of present illness:  32 -year-old female with a history of asthma came to the ED with worsening of shortness of breath. Patient was seen in the ED yesterday and was sent home today came to the hospital as patient could not breathe at work she was getting short of breath on walking. Also complains of cough but no phlegm. Patient had fever of 102 2 days ago. Chest x-ray done yesterday was negative for pneumonia. She denies fever today. She has had some diarrhea but no nausea vomiting. Does complain of some abdominal pain due to coughing. Denies dysuria urgency or frequency of urination   Hospital Course:  Patient was admitted with asthma exacerbation.  She was given IV steroids as well as nebulizer treatments.  She does feel significantly improved and is no longer having any wheezing. She will be transitioned to oral prednisone. She already has an albuterol inhaler at home. We will start her on Flovent to help control her asthma. She will need to followup with primary care physician. She strongly been advised to quit smoking since this will significantly improve her respiratory status. She wishes to try Chantix to help smoking. We discussed possible side effects including worsening depression, nightmares. She was advised to discontinue medications if she experiences any of this. The  patient verbalized understanding. She's also been complaining of chronic cough which is worse at night. She has a history of a hiatal hernia and with her body habitus, she is certainly at risk for acid reflux. We will start her on Prilosec and observe her response. If her coughing persists, it may be reasonable to discontinue her ARB and try her on a new antihypertensive. Patient is stable for discharge home.  Procedures:  none  Consultations:  none  Discharge Exam: Filed Vitals:   07/09/13 2139 07/09/13 2335 07/10/13 0630 07/10/13 0726  BP: 169/90 153/72 153/70   Pulse: 90 86 74   Temp: 98.2 F (36.8 C) 97.5 F (36.4 C) 97.9 F (36.6 C)   TempSrc: Oral Oral Oral   Resp: 20 20 20    Height:      Weight:      SpO2: 94% 92% 94% 95%    General: NAD Cardiovascular: S1, s2 RRR Respiratory: CTA B  Discharge Instructions  Discharge Orders   Future Orders Complete By Expires     Call MD for:  difficulty breathing, headache or visual disturbances  As directed     Call MD for:  temperature >100.4  As directed     Diet - low sodium heart healthy  As directed     Increase activity slowly  As directed         Medication List         albuterol 108 (90 BASE) MCG/ACT inhaler  Commonly known as:  PROVENTIL HFA;VENTOLIN HFA  Inhale 1-2 puffs into the lungs every 6 (six) hours as needed  for wheezing.     benzonatate 200 MG capsule  Commonly known as:  TESSALON  Take 1 capsule (200 mg total) by mouth 3 (three) times daily.     fluticasone 110 MCG/ACT inhaler  Commonly known as:  FLOVENT HFA  Inhale 1 puff into the lungs 2 (two) times daily.     fluticasone 50 MCG/ACT nasal spray  Commonly known as:  FLONASE  Place 2 sprays into the nose daily as needed for rhinitis.     losartan 100 MG tablet  Commonly known as:  COZAAR  Take 100 mg by mouth daily.     omeprazole 20 MG capsule  Commonly known as:  PRILOSEC  Take 1 capsule (20 mg total) by mouth daily.     predniSONE 10  MG tablet  Commonly known as:  DELTASONE  Take 40mg  po daily for 2 days then 30mg  po daily for 2 days then 20mg  po daily for 2 days then 10mg  po daily for 2 days then stop     varenicline 0.5 MG tablet  Commonly known as:  CHANTIX  Take 0.5mg  po daily for days 1-3, then take 0.5mg  po bid for 11 weeks       Allergies  Allergen Reactions  . Lisinopril Other (See Comments)    Cough       Follow-up Information   Follow up with follow up with primary care doctor in 2 weeks.       The results of significant diagnostics from this hospitalization (including imaging, microbiology, ancillary and laboratory) are listed below for reference.    Significant Diagnostic Studies: Dg Chest 2 View  07/07/2013   *RADIOLOGY REPORT*  Clinical Data: Shortness of breath.  CHEST - 2 VIEW  Comparison: None.  Findings: Lungs are somewhat hypoinflated without consolidation or effusion.  The cardiomediastinal silhouette, bones and soft tissues are unremarkable.  IMPRESSION: No acute cardiopulmonary disease.   Original Report Authenticated By: Elberta Fortis, M.D.    Microbiology: No results found for this or any previous visit (from the past 240 hour(s)).   Labs: Basic Metabolic Panel:  Recent Labs Lab 07/08/13 1925 07/09/13 0514  NA 139 140  K 3.4* 4.1  CL 107 109  CO2 19 21  GLUCOSE 113* 110*  BUN 13 13  CREATININE 0.65 0.62  CALCIUM 9.1 9.0   Liver Function Tests:  Recent Labs Lab 07/09/13 0514  AST 14  ALT 13  ALKPHOS 84  BILITOT 0.1*  PROT 6.9  ALBUMIN 3.4*   No results found for this basename: LIPASE, AMYLASE,  in the last 168 hours No results found for this basename: AMMONIA,  in the last 168 hours CBC:  Recent Labs Lab 07/08/13 1925 07/09/13 0514  WBC 11.8* 11.0*  NEUTROABS 10.4*  --   HGB 14.5 13.8  HCT 42.1 40.9  MCV 94.8 96.0  PLT 224 237   Cardiac Enzymes: No results found for this basename: CKTOTAL, CKMB, CKMBINDEX, TROPONINI,  in the last 168  hours BNP: BNP (last 3 results) No results found for this basename: PROBNP,  in the last 8760 hours CBG: No results found for this basename: GLUCAP,  in the last 168 hours     Signed:  MEMON,JEHANZEB  [facility] 07/10/2013, 9:49 AM

## 2013-07-10 NOTE — Progress Notes (Signed)
Pt discharged with instructions, prescriptions, and care notes.  She verbalized understanding.  The patient left the floor via w/c with staff and family in stable condition.

## 2013-07-10 NOTE — ED Provider Notes (Signed)
Medical screening examination/treatment/procedure(s) were performed by non-physician practitioner and as supervising physician I was immediately available for consultation/collaboration.   Christopher J. Pollina, MD 07/10/13 1729 

## 2014-04-09 ENCOUNTER — Emergency Department (HOSPITAL_COMMUNITY): Payer: Medicaid Other

## 2014-04-09 ENCOUNTER — Emergency Department (HOSPITAL_COMMUNITY)
Admission: EM | Admit: 2014-04-09 | Discharge: 2014-04-09 | Disposition: A | Discharge status: Home / Self Care | Payer: Medicaid Other | Attending: Emergency Medicine | Admitting: Emergency Medicine

## 2014-04-09 ENCOUNTER — Encounter (HOSPITAL_COMMUNITY): Payer: Self-pay | Admitting: Emergency Medicine

## 2014-04-09 DIAGNOSIS — F17200 Nicotine dependence, unspecified, uncomplicated: Secondary | ICD-10-CM | POA: Insufficient documentation

## 2014-04-09 DIAGNOSIS — Y9389 Activity, other specified: Secondary | ICD-10-CM | POA: Insufficient documentation

## 2014-04-09 DIAGNOSIS — S39012A Strain of muscle, fascia and tendon of lower back, initial encounter: Secondary | ICD-10-CM

## 2014-04-09 DIAGNOSIS — S335XXA Sprain of ligaments of lumbar spine, initial encounter: Secondary | ICD-10-CM | POA: Insufficient documentation

## 2014-04-09 DIAGNOSIS — X500XXA Overexertion from strenuous movement or load, initial encounter: Secondary | ICD-10-CM | POA: Insufficient documentation

## 2014-04-09 DIAGNOSIS — I10 Essential (primary) hypertension: Secondary | ICD-10-CM | POA: Insufficient documentation

## 2014-04-09 DIAGNOSIS — J45909 Unspecified asthma, uncomplicated: Secondary | ICD-10-CM | POA: Insufficient documentation

## 2014-04-09 DIAGNOSIS — Y929 Unspecified place or not applicable: Secondary | ICD-10-CM | POA: Insufficient documentation

## 2014-04-09 MED ORDER — OXYCODONE-ACETAMINOPHEN 5-325 MG PO TABS
1.0000 | ORAL_TABLET | Freq: Once | ORAL | Status: AC
  Administered 2014-04-09: 1 via ORAL
  Filled 2014-04-09: qty 1

## 2014-04-09 MED ORDER — CYCLOBENZAPRINE HCL 10 MG PO TABS
10.0000 mg | ORAL_TABLET | Freq: Once | ORAL | Status: AC
  Administered 2014-04-09: 10 mg via ORAL
  Filled 2014-04-09: qty 1

## 2014-04-09 MED ORDER — CYCLOBENZAPRINE HCL 10 MG PO TABS: 10.0000 mg | ORAL_TABLET | Freq: Three times a day (TID) | ORAL | Status: DC | PRN

## 2014-04-09 MED ORDER — NAPROXEN 500 MG PO TABS
500.0000 mg | ORAL_TABLET | Freq: Two times a day (BID) | ORAL | Status: DC
Start: 2014-04-09 — End: 2014-05-21

## 2014-04-09 MED ORDER — OXYCODONE-ACETAMINOPHEN 5-325 MG PO TABS: 1.0000 | ORAL_TABLET | ORAL | Status: DC | PRN

## 2014-04-09 NOTE — ED Notes (Signed)
Pt states she bent over to put mousse in her hair. Pt states she felt a pop in her lower back.

## 2014-04-09 NOTE — Discharge Instructions (Signed)
Lumbosacral Strain Lumbosacral strain is a strain of any of the parts that make up your lumbosacral vertebrae. Your lumbosacral vertebrae are the bones that make up the lower third of your backbone. Your lumbosacral vertebrae are held together by muscles and tough, fibrous tissue (ligaments).  CAUSES  A sudden blow to your back can cause lumbosacral strain. Also, anything that causes an excessive stretch of the muscles in the low back can cause this strain. This is typically seen when people exert themselves strenuously, fall, lift heavy objects, bend, or crouch repeatedly. RISK FACTORS  Physically demanding work.  Participation in pushing or pulling sports or sports that require sudden twist of the back (tennis, golf, baseball).  Weight lifting.  Excessive lower back curvature.  Forward-tilted pelvis.  Weak back or abdominal muscles or both.  Tight hamstrings. SIGNS AND SYMPTOMS  Lumbosacral strain may cause pain in the area of your injury or pain that moves (radiates) down your leg.  DIAGNOSIS Your health care provider can often diagnose lumbosacral strain through a physical exam. In some cases, you may need tests such as X-ray exams.  TREATMENT  Treatment for your lower back injury depends on many factors that your clinician will have to evaluate. However, most treatment will include the use of anti-inflammatory medicines. HOME CARE INSTRUCTIONS   Avoid hard physical activities (tennis, racquetball, waterskiing) if you are not in proper physical condition for it. This may aggravate or create problems.  If you have a back problem, avoid sports requiring sudden body movements. Swimming and walking are generally safer activities.  Maintain good posture.  Maintain a healthy weight.  For acute conditions, you may put ice on the injured area.  Put ice in a plastic bag.  Place a towel between your skin and the bag.  Leave the ice on for 20 minutes, 2 3 times a day.  When the  low back starts healing, stretching and strengthening exercises may be recommended. SEEK MEDICAL CARE IF:  Your back pain is getting worse.  You experience severe back pain not relieved with medicines. SEEK IMMEDIATE MEDICAL CARE IF:   You have numbness, tingling, weakness, or problems with the use of your arms or legs.  There is a change in bowel or bladder control.  You have increasing pain in any area of the body, including your belly (abdomen).  You notice shortness of breath, dizziness, or feel faint.  You feel sick to your stomach (nauseous), are throwing up (vomiting), or become sweaty.  You notice discoloration of your toes or legs, or your feet get very cold. MAKE SURE YOU:   Understand these instructions.  Will watch your condition.  Will get help right away if you are not doing well or get worse. Document Released: 09/04/2005 Document Revised: 09/15/2013 Document Reviewed: 07/14/2013 [facility] Patient Information 2014 [place], [place].   [facility] [facility]    Kari BaarsEdward Hawkins MD. Specialty: Pulmonary Disease Contact information: [address]  [address]  [place] [address]  [phone]   Syliva OvermanMargaret Simpson, MD. Specialty: [facility] Contact information: [address]  [place] [address]  [phone]   Lilyan PuntScott Luking, MD. Specialty: [facility] Contact information: [address]  [address]  [place] [address]  [phone]   Avon Gullyesfaye Fanta, MD Specialty: Internal Medicine Contact information: [address]  [place] [address]  [phone]   Catalina PizzaZach Hall, MD. Specialty: Internal Medicine Contact information: [address]  [place] [address]  [phone]  Butch PennyAngus Mcinnis, MD. Specialty: Family Medicine Contact information: [address]  [place] [address]  [phone]   John GiovanniStephen Knowlton, MD. Specialty: [facility] Contact information: [address]  [address]    [place] [address]  [phone]   Carylon Perchesoy Fagan, MD. Specialty: Internal Medicine Contact information: [address]  [address]  [place] [address]  [phone]

## 2014-04-09 NOTE — ED Notes (Signed)
Pt received discharge instructions and prescriptions, verbalized understanding and has no further questions. Pt ambulated to exit in stable condition.  Advised to follow up with primary care physician.

## 2014-04-10 NOTE — ED Provider Notes (Signed)
CSN: [id number]     Arrival date & time 04/09/14  2020 History   First MD Initiated Contact with Patient 04/09/14 2037     Chief Complaint  Patient presents with  . Back Pain     (Consider location/radiation/quality/duration/timing/severity/associated sxs/prior Treatment) Patient is a 33 y.o. female presenting with back pain. The history is provided by the patient.  Back Pain Location:  Lumbar spine Quality:  Aching and stabbing Radiates to:  L posterior upper leg and R posterior upper leg Pain severity:  Moderate Pain is:  Same all the time Onset quality:  Sudden Duration:  12 hours Timing:  Constant Progression:  Worsening Context: twisting   Context: not falling   Context comment:  Bending over, felt a "pop" in her lower back Relieved by:  Nothing Worsened by:  Twisting and standing Ineffective treatments:  None tried Associated symptoms: leg pain   Associated symptoms: no abdominal pain, no abdominal swelling, no bladder incontinence, no bowel incontinence, no chest pain, no dysuria, no fever, no headaches, no numbness, no paresthesias, no pelvic pain, no perianal numbness, no tingling and no weakness     Past Medical History  Diagnosis Date  . Asthma   . Hypertension    Past Surgical History  Procedure Laterality Date  . Cesarean section    . Tubal ligation     History reviewed. No pertinent family history. History  Substance Use Topics  . Smoking status: Current Every Day Smoker  . Smokeless tobacco: Not on file  . Alcohol Use: No   OB History   Grav Para Term Preterm Abortions TAB SAB Ect Mult Living                 Review of Systems  Constitutional: Negative for fever.  Respiratory: Negative for shortness of breath.   Cardiovascular: Negative for chest pain.  Gastrointestinal: Negative for vomiting, abdominal pain, constipation and bowel incontinence.  Genitourinary: Negative for bladder incontinence, dysuria, hematuria, flank pain, decreased urine  volume, difficulty urinating and pelvic pain.       No perineal numbness or incontinence of urine or feces  Musculoskeletal: Positive for back pain. Negative for joint swelling.  Skin: Negative for rash.  Neurological: Negative for tingling, weakness, numbness, headaches and paresthesias.  All other systems reviewed and are negative.     Allergies  Lisinopril  Home Medications   Prior to Admission medications   Medication Sig Start Date End Date Taking? Authorizing Provider  cyclobenzaprine (FLEXERIL) 10 MG tablet Take 1 tablet (10 mg total) by mouth 3 (three) times daily as needed. 04/09/14   Zalayah Pizzuto L. Jovanni Rash, PA-C  naproxen (NAPROSYN) 500 MG tablet Take 1 tablet (500 mg total) by mouth 2 (two) times daily. 04/09/14   Nikiah Goin L. Keval Nam, PA-C  oxyCODONE-acetaminophen (PERCOCET/ROXICET) 5-325 MG per tablet Take 1 tablet by mouth every 4 (four) hours as needed for severe pain. 04/09/14   Ebba Goll L. Grayer Sproles, PA-C   BP 136/74  Pulse 78  Temp(Src) 97.5 F (36.4 C) (Oral)  Resp 20  SpO2 98%  LMP 04/09/2014 Physical Exam  Nursing note and vitals reviewed. Constitutional: She is oriented to person, place, and time. She appears well-developed and well-nourished. No distress.  HENT:  Head: Normocephalic and atraumatic.  Neck: Normal range of motion. Neck supple.  Cardiovascular: Normal rate, regular rhythm, normal heart sounds and intact distal pulses.   No murmur heard. Pulmonary/Chest: Effort normal and breath sounds normal. No respiratory distress. She exhibits no tenderness.  Abdominal: Soft.  She exhibits no distension. There is no tenderness. There is no rebound and no guarding.  Musculoskeletal: She exhibits tenderness. She exhibits no edema.       Lumbar back: She exhibits tenderness and pain. She exhibits normal range of motion, no swelling, no deformity, no laceration and normal pulse.  Diffuse ttp of the lumbar spine and  paraspinal muscles.    DP pulses are brisk and symmetrical.   Distal sensation intact.  Hip Flexors/Extensors are intact.  Pt has normal strength against resistance of bilateral lower extremities.     Neurological: She is alert and oriented to person, place, and time. She has normal strength. No sensory deficit. She exhibits normal muscle tone. Coordination and gait normal.  Reflex Scores:      Patellar reflexes are 2+ on the right side and 2+ on the left side.      Achilles reflexes are 2+ on the right side and 2+ on the left side. Skin: Skin is warm and dry. No rash noted.    ED Course  Procedures (including critical care time) Labs Review Labs Reviewed - No data to display  Imaging Review Dg Lumbar Spine Complete  04/09/2014   CLINICAL DATA:  Low back pain following a bending injury.  EXAM: LUMBAR SPINE - COMPLETE 4+ VIEW  COMPARISON:  None.  FINDINGS: Transitional thoracolumbar and lumbosacral vertebrae with 4 intervening non-rib-bearing lumbar vertebrae. No fractures, pars defects or subluxations.  IMPRESSION: No fracture or subluxation.   Electronically Signed   By: Gordan PaymentSteve  Reid M.D.   On: 04/09/2014 21:22     EKG Interpretation None      MDM   Final diagnoses:  Lumbar strain    Pt is feeling better after medication.  No concerning sx's for emergent neurological or infectious process.  Gait is steady, no focal neuro deficits, VSS.  Likely muscular strain.  I have advised pt to close f/u with PMD if sx's are not improving with naprosyn, flexeril and percocet.  Pt verbalized understanding and agrees to plan.  She appear stable for discharge.      Serina Nichter L. Yicel Shannon, PA-C 04/11/14 0004

## 2014-04-11 NOTE — ED Provider Notes (Signed)
Medical screening examination/treatment/procedure(s) were performed by non-physician practitioner and as supervising physician I was immediately available for consultation/collaboration.   EKG Interpretation None        Laray AngerKathleen M Janya Eveland, DO 04/11/14 1400

## 2014-05-21 ENCOUNTER — Encounter (HOSPITAL_COMMUNITY): Payer: Self-pay | Admitting: Emergency Medicine

## 2014-05-21 ENCOUNTER — Emergency Department (HOSPITAL_COMMUNITY)
Admission: EM | Admit: 2014-05-21 | Discharge: 2014-05-21 | Disposition: A | Discharge status: Home / Self Care | Payer: Medicaid Other | Attending: Emergency Medicine | Admitting: Emergency Medicine

## 2014-05-21 DIAGNOSIS — Z791 Long term (current) use of non-steroidal anti-inflammatories (NSAID): Secondary | ICD-10-CM | POA: Insufficient documentation

## 2014-05-21 DIAGNOSIS — I10 Essential (primary) hypertension: Secondary | ICD-10-CM | POA: Insufficient documentation

## 2014-05-21 DIAGNOSIS — J45909 Unspecified asthma, uncomplicated: Secondary | ICD-10-CM | POA: Insufficient documentation

## 2014-05-21 DIAGNOSIS — F17200 Nicotine dependence, unspecified, uncomplicated: Secondary | ICD-10-CM | POA: Insufficient documentation

## 2014-05-21 DIAGNOSIS — Z79899 Other long term (current) drug therapy: Secondary | ICD-10-CM | POA: Insufficient documentation

## 2014-05-21 DIAGNOSIS — R112 Nausea with vomiting, unspecified: Secondary | ICD-10-CM | POA: Insufficient documentation

## 2014-05-21 DIAGNOSIS — J029 Acute pharyngitis, unspecified: Secondary | ICD-10-CM | POA: Insufficient documentation

## 2014-05-21 LAB — RAPID STREP SCREEN (MED CTR MEBANE ONLY): STREPTOCOCCUS, GROUP A SCREEN (DIRECT): NEGATIVE

## 2014-05-21 MED ORDER — HYDROCODONE-ACETAMINOPHEN 5-325 MG PO TABS: 1.0000 | ORAL_TABLET | ORAL | Status: DC | PRN

## 2014-05-21 MED ORDER — IBUPROFEN 400 MG PO TABS
600.0000 mg | ORAL_TABLET | Freq: Once | ORAL | Status: AC
  Administered 2014-05-21: 600 mg via ORAL
  Filled 2014-05-21: qty 2

## 2014-05-21 MED ORDER — IBUPROFEN 600 MG PO TABS: 600.0000 mg | ORAL_TABLET | Freq: Four times a day (QID) | ORAL | Status: DC | PRN

## 2014-05-21 MED ORDER — ONDANSETRON HCL 4 MG PO TABS: 4.0000 mg | ORAL_TABLET | Freq: Four times a day (QID) | ORAL | Status: DC

## 2014-05-21 NOTE — Discharge Instructions (Signed)
Your strep screen today is negative. We are treating your pain and nausea. Do not take the pain medication if you are driving as it will make you sleepy. Follow up with your doctor.  Return here as needed.

## 2014-05-21 NOTE — ED Notes (Signed)
Patient with no complaints at this time. Respirations even and unlabored. Skin warm/dry. Discharge instructions reviewed with patient at this time. Patient given opportunity to voice concerns/ask questions. Patient discharged at this time and left Emergency Department with steady gait.   

## 2014-05-21 NOTE — ED Provider Notes (Signed)
CSN: [id number]     Arrival date & time 05/21/14  1701 History   First MD Initiated Contact with Patient 05/21/14 1710     Chief Complaint  Patient presents with  . Sore Throat     (Consider location/radiation/quality/duration/timing/severity/associated sxs/prior Treatment) Patient is a 33 y.o. female presenting with pharyngitis. The history is provided by the patient.  Sore Throat This is a new problem. The current episode started in the past 7 days. The problem occurs constantly. The problem has been gradually worsening. Associated symptoms include chills, a fever, nausea, a sore throat and vomiting. Pertinent negatives include no congestion or coughing. The symptoms are aggravated by swallowing. She has tried nothing for the symptoms.   Destiny Petty is a 33 y.o. female who presents to the ED with a sore throat that started 3 days ago. She has had fever and chills and green nasal drainage. She vomited x 2 this am but none since then.   Past Medical History  Diagnosis Date  . Asthma   . Hypertension    Past Surgical History  Procedure Laterality Date  . Cesarean section    . Tubal ligation     History reviewed. No pertinent family history. History  Substance Use Topics  . Smoking status: Current Every Day Smoker  . Smokeless tobacco: Not on file  . Alcohol Use: No   OB History   Grav Para Term Preterm Abortions TAB SAB Ect Mult Living                 Review of Systems  Constitutional: Positive for fever and chills.  HENT: Positive for sore throat. Negative for congestion.   Respiratory: Negative for cough.   Gastrointestinal: Positive for nausea and vomiting.  All other systems negative    Allergies  Lisinopril  Home Medications   Prior to Admission medications   Medication Sig Start Date End Date Taking? Authorizing Provider  cyclobenzaprine (FLEXERIL) 10 MG tablet Take 1 tablet (10 mg total) by mouth 3 (three) times daily as needed. 04/09/14   Tammy L.  Triplett, PA-C  naproxen (NAPROSYN) 500 MG tablet Take 1 tablet (500 mg total) by mouth 2 (two) times daily. 04/09/14   Tammy L. Triplett, PA-C  oxyCODONE-acetaminophen (PERCOCET/ROXICET) 5-325 MG per tablet Take 1 tablet by mouth every 4 (four) hours as needed for severe pain. 04/09/14   Tammy L. Triplett, PA-C   BP 137/78  Pulse 90  Temp(Src) 98.1 F (36.7 C) (Oral)  Resp 18  Ht 5\' 2"  (1.575 m)  Wt 218 lb (98.884 kg)  BMI 39.86 kg/m2  SpO2 99%  LMP 04/27/2014 Physical Exam  Nursing note and vitals reviewed. Constitutional: She is oriented to person, place, and time. She appears well-developed and well-nourished. No distress.  HENT:  Head: Normocephalic.  Right Ear: Tympanic membrane normal.  Left Ear: Tympanic membrane normal.  Nose: Rhinorrhea present.  Mouth/Throat: Uvula is midline. Posterior oropharyngeal erythema present.  Eyes: Conjunctivae and EOM are normal.  Neck: Neck supple.  Pulmonary/Chest: Effort normal.  Abdominal: Soft. There is no tenderness.  Musculoskeletal: Normal range of motion.  Neurological: She is alert and oriented to person, place, and time. No cranial nerve deficit.  Skin: Skin is warm and dry.  Psychiatric: She has a normal mood and affect. Her behavior is normal.    ED Course  Procedures (including critical care time) Labs Review Results for orders placed during the hospital encounter of 05/21/14 (from the past 24 hour(s))  RAPID STREP SCREEN  Status: None   Collection Time    05/21/14  5:22 PM      Result Value Ref Range   Streptococcus, Group A Screen (Direct) NEGATIVE  NEGATIVE    MDM  33 y.o. female with sore throat and nausea. Rapid strep screen negative. Will treat as viral illness. Stable for discharge without further screening at this time.  Discussed with the patient and all questioned fully answered. She will return if any problems arise.    Medication List    STOP taking these medications       cyclobenzaprine 10 MG tablet   Commonly known as:  FLEXERIL     naproxen 500 MG tablet  Commonly known as:  NAPROSYN     oxyCODONE-acetaminophen 5-325 MG per tablet  Commonly known as:  PERCOCET/ROXICET      TAKE these medications       HYDROcodone-acetaminophen 5-325 MG per tablet  Commonly known as:  NORCO/VICODIN  Take 1 tablet by mouth every 4 (four) hours as needed.     ibuprofen 600 MG tablet  Commonly known as:  ADVIL,MOTRIN  Take 1 tablet (600 mg total) by mouth every 6 (six) hours as needed.     ondansetron 4 MG tablet  Commonly known as:  ZOFRAN  Take 1 tablet (4 mg total) by mouth every 6 (six) hours.           [facility] Orlene OchM Shin Lamour, [place]NP 05/22/14 [phone]

## 2014-05-21 NOTE — ED Notes (Signed)
sorethroat and fever since Thursday

## 2014-05-23 NOTE — ED Provider Notes (Signed)
Medical screening examination/treatment/procedure(s) were performed by non-physician practitioner and as supervising physician I was immediately available for consultation/collaboration.   EKG Interpretation None        Joya Gaskinsonald W Jaheem Hedgepath, MD 05/23/14 [phone]

## 2014-05-25 LAB — CULTURE, GROUP A STREP

## 2015-01-24 ENCOUNTER — Other Ambulatory Visit: Payer: Self-pay | Admitting: Obstetrics and Gynecology

## 2015-02-10 ENCOUNTER — Encounter: Payer: Self-pay | Admitting: Adult Health

## 2015-02-10 ENCOUNTER — Ambulatory Visit (INDEPENDENT_AMBULATORY_CARE_PROVIDER_SITE_OTHER): Payer: BLUE CROSS/BLUE SHIELD | Admitting: Adult Health

## 2015-02-10 ENCOUNTER — Other Ambulatory Visit (HOSPITAL_COMMUNITY)
Admission: RE | Admit: 2015-02-10 | Discharge: 2015-02-10 | Disposition: A | Discharge status: Home / Self Care | Payer: Medicaid Other | Source: Ambulatory Visit | Attending: Adult Health | Admitting: Adult Health

## 2015-02-10 VITALS — BP 120/70 | HR 87 | Ht 62.0 in | Wt 224.0 lb

## 2015-02-10 DIAGNOSIS — Z1212 Encounter for screening for malignant neoplasm of rectum: Secondary | ICD-10-CM

## 2015-02-10 DIAGNOSIS — Z01419 Encounter for gynecological examination (general) (routine) without abnormal findings: Secondary | ICD-10-CM

## 2015-02-10 DIAGNOSIS — Z1151 Encounter for screening for human papillomavirus (HPV): Secondary | ICD-10-CM | POA: Diagnosis present

## 2015-02-10 DIAGNOSIS — A630 Anogenital (venereal) warts: Secondary | ICD-10-CM

## 2015-02-10 DIAGNOSIS — Z0000 Encounter for general adult medical examination without abnormal findings: Secondary | ICD-10-CM

## 2015-02-10 DIAGNOSIS — K649 Unspecified hemorrhoids: Secondary | ICD-10-CM

## 2015-02-10 HISTORY — DX: Anogenital (venereal) warts: A63.0

## 2015-02-10 HISTORY — DX: Unspecified hemorrhoids: K64.9

## 2015-02-10 LAB — HEMOCCULT GUIAC POC 1CARD (OFFICE): FECAL OCCULT BLD: NEGATIVE

## 2015-02-10 MED ORDER — IMIQUIMOD 5 % EX CREA: TOPICAL_CREAM | CUTANEOUS | Status: DC

## 2015-02-10 NOTE — Progress Notes (Signed)
Patient ID: Destiny Petty, female   DOB: 07-Jan-1981, 34 y.o.   MRN: [id number] History of Present Illness: Destiny Petty is a  34 year old white female, new to this practice, in for well woman gyn exam and pap. She is sp tubal ligation.  Current Medications, Allergies, Past Medical History, Past Surgical History, Family History and Social History were reviewed in [facility] electronic medical record.     Review of Systems: Patient denies any daily headaches(has hx migraines), hearing loss,  blurred vision, shortness of breath, chest pain, abdominal pain, problems with bowel movements, urination, or intercourse. No joint pain or mood swings.Has chronic pain in low back.Has been tired and labs 2 weeks ago with Dr Felecia ShellingFanta.    Physical Exam:BP 120/70 mmHg  Pulse 87  Ht 5\' 2"  (1.575 m)  Wt 224 lb (101.606 kg)  BMI 40.96 kg/m2  LMP 02/07/2015 General:  Well developed, well nourished, no acute distress Skin:  Warm and dry,numerous tattoos Neck:  Midline trachea, normal thyroid, good ROM, no lymphadenopathy Lungs; Clear to auscultation bilaterally Breast:  No dominant palpable mass, retraction, or nipple discharge Cardiovascular: Regular rate and rhythm Abdomen:  Soft, non tender, no hepatosplenomegaly Pelvic:  External genitalia is normal in appearance, no lesions.  The vagina is normal in appearance,with good color, moisture and rugae.[name]. Urethra has no lesions or masses. The cervix is bulbous, pap with HPV performed.  Uterus is felt to be normal size, shape, and contour.  No adnexal masses or tenderness noted.Bladder is non tender, no masses felt. Rectal: Good sphincter tone, no polyps, has internal and external  hemorrhoids felt. Has wart on external hemorrhoid. Hemoccult negative. Extremities/musculoskeletal:  No swelling or varicosities noted, no clubbing or cyanosis, no pain today on low back with palpation,gait normal. Psych:  No mood changes, alert and cooperative,seems  happy   Impression: Well woman gyn exam with pap Wart Hemorrhoids     Plan: Rx aldara 5% cream to use 3 x weekly #12 with 3 refills Return in 8 weeks for recheck Physical in 1 year Mammogram at 38 Review handout on warts and hemorrhoids Call Dr Felecia ShellingFanta about labs and try ice on back and call him with back pain

## 2015-02-10 NOTE — Patient Instructions (Signed)
Physical in 1 year Follow up in 8 weeks for recheck Use aldara 3 x weekly Call dr Felecia ShellingFanta for back Hemorrhoids Hemorrhoids are swollen veins around the rectum or anus. There are two types of hemorrhoids:   Internal hemorrhoids. These occur in the veins just inside the rectum. They may poke through to the outside and become irritated and painful.  External hemorrhoids. These occur in the veins outside the anus and can be felt as a painful swelling or hard lump near the anus. CAUSES  Pregnancy.   Obesity.   Constipation or diarrhea.   Straining to have a bowel movement.   Sitting for long periods on the toilet.  Heavy lifting or other activity that caused you to strain.  Anal intercourse. SYMPTOMS   Pain.   Anal itching or irritation.   Rectal bleeding.   Fecal leakage.   Anal swelling.   One or more lumps around the anus.  DIAGNOSIS  Your caregiver may be able to diagnose hemorrhoids by visual examination. Other examinations or tests that may be performed include:   Examination of the rectal area with a gloved hand (digital rectal exam).   Examination of anal canal using a small tube (scope).   A blood test if you have lost a significant amount of blood.  A test to look inside the colon (sigmoidoscopy or colonoscopy). TREATMENT Most hemorrhoids can be treated at home. However, if symptoms do not seem to be getting better or if you have a lot of rectal bleeding, your caregiver may perform a procedure to help make the hemorrhoids get smaller or remove them completely. Possible treatments include:   Placing a rubber band at the base of the hemorrhoid to cut off the circulation (rubber band ligation).   Injecting a chemical to shrink the hemorrhoid (sclerotherapy).   Using a tool to burn the hemorrhoid (infrared light therapy).   Surgically removing the hemorrhoid (hemorrhoidectomy).   Stapling the hemorrhoid to block blood flow to the tissue  (hemorrhoid stapling).  HOME CARE INSTRUCTIONS   Eat foods with fiber, such as whole grains, beans, nuts, fruits, and vegetables. Ask your doctor about taking products with added fiber in them (fibersupplements).  Increase fluid intake. Drink enough water and fluids to keep your urine clear or pale yellow.   Exercise regularly.   Go to the bathroom when you have the urge to have a bowel movement. Do not wait.   Avoid straining to have bowel movements.   Keep the anal area dry and clean. Use wet toilet paper or moist towelettes after a bowel movement.   Medicated creams and suppositories may be used or applied as directed.   Only take over-the-counter or prescription medicines as directed by your caregiver.   Take warm sitz baths for 15-20 minutes, 3-4 times a day to ease pain and discomfort.   Place ice packs on the hemorrhoids if they are tender and swollen. Using ice packs between sitz baths may be helpful.   Put ice in a plastic bag.   Place a towel between your skin and the bag.   Leave the ice on for 15-20 minutes, 3-4 times a day.   Do not use a donut-shaped pillow or sit on the toilet for long periods. This increases blood pooling and pain.  SEEK MEDICAL CARE IF:  You have increasing pain and swelling that is not controlled by treatment or medicine.  You have uncontrolled bleeding.  You have difficulty or you are unable to have a  bowel movement.  You have pain or inflammation outside the area of the hemorrhoids. MAKE SURE YOU:  Understand these instructions.  Will watch your condition.  Will get help right away if you are not doing well or get worse. Document Released: 11/22/2000 Document Revised: 11/11/2012 Document Reviewed: 09/29/2012 [facility] Patient Information 2015 [place], [place]. This information is not intended to replace advice given to you by your health care provider. Make sure you discuss any questions you have with your health care  provider. Genital Warts Genital warts are a sexually transmitted infection. They may appear as small bumps on the tissues of the genital area. CAUSES  Genital warts are caused by a virus called human papillomavirus (HPV). HPV is the most common sexually transmitted disease (STD) and infection of the sex organs. This infection is spread by having unprotected sex with an infected person. It can be spread by vaginal, anal, and oral sex. Many people do not know they are infected. They may be infected for years without problems. However, even if they do not have problems, they can unknowingly pass the infection to their sexual partners. SYMPTOMS   Itching and irritation in the genital area.  Warts that bleed.  Painful sexual intercourse. DIAGNOSIS  Warts are usually recognized with the naked eye on the vagina, vulva, perineum, anus, and rectum. Certain tests can also diagnose genital warts, such as:  A Pap test.  A tissue sample (biopsy) exam.  Colposcopy. A magnifying tool is used to examine the vagina and cervix. The HPV cells will change color when certain solutions are used. TREATMENT  Warts can be removed by:  Applying certain chemicals, such as cantharidin or podophyllin.  Liquid nitrogen freezing (cryotherapy).  Immunotherapy with Candida or Trichophyton injections.  Laser treatment.  Burning with an electrified probe (electrocautery).  Interferon injections.  Surgery. PREVENTION  HPV vaccination can help prevent HPV infections that cause genital warts and that cause cancer of the cervix. It is recommended that the vaccination be given to people between the ages 25 to 56 years old. The vaccine might not work as well or might not work at all if you already have HPV. It should not be given to pregnant women. HOME CARE INSTRUCTIONS   It is important to follow your caregiver's instructions. The warts will not go away without treatment. Repeat treatments are often needed to get  rid of warts. Even after it appears that the warts are gone, the normal tissue underneath often remains infected.  Do not try to treat genital warts with medicine used to treat hand warts. This type of medicine is strong and can burn the skin in the genital area, causing more damage.  Tell your past and current sexual partner(s) that you have genital warts. They may be infected also and need treatment.  Avoid sexual contact while being treated.  Do not touch or scratch the warts. The infection may spread to other parts of your body.  Women with genital warts should have a cervical cancer check (Pap test) at least once a year. This type of cancer is slow-growing and can be cured if found early. Chances of developing cervical cancer are increased with HPV.  Inform your obstetrician about your warts in the event of pregnancy. This virus can be passed to the baby's respiratory tract. Discuss this with your caregiver.  Use a condom during sexual intercourse. Following treatment, the use of condoms will help prevent reinfection.  Ask your caregiver about using over-the-counter anti-itch creams. SEEK MEDICAL  CARE IF:   Your treated skin becomes red, swollen, or painful.  You have a fever.  You feel generally ill.  You feel little lumps in and around your genital area.  You are bleeding or have painful sexual intercourse. MAKE SURE YOU:   Understand these instructions.  Will watch your condition.  Will get help right away if you are not doing well or get worse. Document Released: 11/22/2000 Document Revised: 04/11/2014 Document Reviewed: 06/03/2011 [facility] Patient Information 2015 [place], [place]. This information is not intended to replace advice given to you by your health care provider. Make sure you discuss any questions you have with your health care provider.

## 2015-02-13 LAB — CYTOLOGY - PAP

## 2015-03-22 ENCOUNTER — Emergency Department (HOSPITAL_COMMUNITY)
Admission: EM | Admit: 2015-03-22 | Discharge: 2015-03-22 | Disposition: A | Discharge status: Home / Self Care | Payer: BLUE CROSS/BLUE SHIELD | Attending: Emergency Medicine | Admitting: Emergency Medicine

## 2015-03-22 ENCOUNTER — Encounter (HOSPITAL_COMMUNITY): Payer: Self-pay | Admitting: Emergency Medicine

## 2015-03-22 DIAGNOSIS — Z79899 Other long term (current) drug therapy: Secondary | ICD-10-CM | POA: Diagnosis not present

## 2015-03-22 DIAGNOSIS — J45909 Unspecified asthma, uncomplicated: Secondary | ICD-10-CM | POA: Diagnosis not present

## 2015-03-22 DIAGNOSIS — X58XXXA Exposure to other specified factors, initial encounter: Secondary | ICD-10-CM | POA: Diagnosis not present

## 2015-03-22 DIAGNOSIS — Z8659 Personal history of other mental and behavioral disorders: Secondary | ICD-10-CM | POA: Diagnosis not present

## 2015-03-22 DIAGNOSIS — I10 Essential (primary) hypertension: Secondary | ICD-10-CM | POA: Insufficient documentation

## 2015-03-22 DIAGNOSIS — Y929 Unspecified place or not applicable: Secondary | ICD-10-CM | POA: Diagnosis not present

## 2015-03-22 DIAGNOSIS — Z8719 Personal history of other diseases of the digestive system: Secondary | ICD-10-CM | POA: Insufficient documentation

## 2015-03-22 DIAGNOSIS — S39012A Strain of muscle, fascia and tendon of lower back, initial encounter: Secondary | ICD-10-CM | POA: Diagnosis not present

## 2015-03-22 DIAGNOSIS — Z8619 Personal history of other infectious and parasitic diseases: Secondary | ICD-10-CM | POA: Insufficient documentation

## 2015-03-22 DIAGNOSIS — Y9389 Activity, other specified: Secondary | ICD-10-CM | POA: Insufficient documentation

## 2015-03-22 DIAGNOSIS — Z720 Tobacco use: Secondary | ICD-10-CM | POA: Diagnosis not present

## 2015-03-22 DIAGNOSIS — E669 Obesity, unspecified: Secondary | ICD-10-CM | POA: Diagnosis not present

## 2015-03-22 DIAGNOSIS — S3992XA Unspecified injury of lower back, initial encounter: Secondary | ICD-10-CM | POA: Diagnosis present

## 2015-03-22 DIAGNOSIS — Y998 Other external cause status: Secondary | ICD-10-CM | POA: Insufficient documentation

## 2015-03-22 MED ORDER — NAPROXEN 250 MG PO TABS
500.0000 mg | ORAL_TABLET | Freq: Once | ORAL | Status: AC
  Administered 2015-03-22: 500 mg via ORAL
  Filled 2015-03-22: qty 2

## 2015-03-22 MED ORDER — HYDROCODONE-ACETAMINOPHEN 5-325 MG PO TABS: 1.0000 | ORAL_TABLET | ORAL | Status: DC | PRN

## 2015-03-22 MED ORDER — CYCLOBENZAPRINE HCL 10 MG PO TABS
10.0000 mg | ORAL_TABLET | Freq: Once | ORAL | Status: AC
  Administered 2015-03-22: 10 mg via ORAL
  Filled 2015-03-22: qty 1

## 2015-03-22 MED ORDER — CYCLOBENZAPRINE HCL 5 MG PO TABS: 5.0000 mg | ORAL_TABLET | Freq: Three times a day (TID) | ORAL | Status: DC | PRN

## 2015-03-22 MED ORDER — HYDROCODONE-ACETAMINOPHEN 5-325 MG PO TABS
1.0000 | ORAL_TABLET | Freq: Once | ORAL | Status: AC
  Administered 2015-03-22: 1 via ORAL
  Filled 2015-03-22: qty 1

## 2015-03-22 MED ORDER — NAPROXEN 500 MG PO TABS: 500.0000 mg | ORAL_TABLET | Freq: Two times a day (BID) | ORAL | Status: DC

## 2015-03-22 NOTE — Discharge Instructions (Signed)
Back Pain, Adult °Low back pain is very common. About 1 in 5 people have back pain. The cause of low back pain is rarely dangerous. The pain often gets better over time. About half of people with a sudden onset of back pain feel better in just 2 weeks. About 8 in 10 people feel better by 6 weeks.  °CAUSES °Some common causes of back pain include: °· Strain of the muscles or ligaments supporting the spine. °· Wear and tear (degeneration) of the spinal discs. °· Arthritis. °· Direct injury to the back. °DIAGNOSIS °Most of the time, the direct cause of low back pain is not known. However, back pain can be treated effectively even when the exact cause of the pain is unknown. Answering your caregiver's questions about your overall health and symptoms is one of the most accurate ways to make sure the cause of your pain is not dangerous. If your caregiver needs more information, he or she may order lab work or imaging tests (X-rays or MRIs). However, even if imaging tests show changes in your back, this usually does not require surgery. °HOME CARE INSTRUCTIONS °For many people, back pain returns. Since low back pain is rarely dangerous, it is often a condition that people can learn to manage on their own.  °· Remain active. It is stressful on the back to sit or stand in one place. Do not sit, drive, or stand in one place for more than 30 minutes at a time. Take short walks on level surfaces as soon as pain allows. Try to increase the length of time you walk each day. °· Do not stay in bed. Resting more than 1 or 2 days can delay your recovery. °· Do not avoid exercise or work. Your body is made to move. It is not dangerous to be active, even though your back may hurt. Your back will likely heal faster if you return to being active before your pain is gone. °· Pay attention to your body when you  bend and lift. Many people have less discomfort when lifting if they bend their knees, keep the load close to their bodies, and  avoid twisting. Often, the most comfortable positions are those that put less stress on your recovering back. °· Find a comfortable position to sleep. Use a firm mattress and lie on your side with your knees slightly bent. If you lie on your back, put a pillow under your knees. °· Only take over-the-counter or prescription medicines as directed by your caregiver. Over-the-counter medicines to reduce pain and inflammation are often the most helpful. Your caregiver may prescribe muscle relaxant drugs. These medicines help dull your pain so you can more quickly return to your normal activities and healthy exercise. °· Put ice on the injured area. °¨ Put ice in a plastic bag. °¨ Place a towel between your skin and the bag. °¨ Leave the ice on for 15-20 minutes, 03-04 times a day for the first 2 to 3 days. After that, ice and heat may be alternated to reduce pain and spasms. °· Ask your caregiver about trying back exercises and gentle massage. This may be of some benefit. °· Avoid feeling anxious or stressed. Stress increases muscle tension and can worsen back pain. It is important to recognize when you are anxious or stressed and learn ways to manage it. Exercise is a great option. °SEEK MEDICAL CARE IF: °· You have pain that is not relieved with rest or medicine. °· You have pain that does not improve in 1 week. °· You have new symptoms. °· You are generally not feeling well. °SEEK   IMMEDIATE MEDICAL CARE IF:   You have pain that radiates from your back into your legs.  You develop new bowel or bladder control problems.  You have unusual weakness or numbness in your arms or legs.  You develop nausea or vomiting.  You develop abdominal pain.  You feel faint. Document Released: 11/25/2005 Document Revised: 05/26/2012 Document Reviewed: 03/29/2014 [facility] Patient Information 2015 [place], [place]. This information is not intended to replace advice given to you by your health care provider. Make sure you  discuss any questions you have with your health care provider.    Do not drive within 4 hours of taking hydrocodone or flexeril as these will make you drowsy.  Avoid lifting,  Bending,  Twisting or any other activity that worsens your pain over the next week.  Apply an  icepack  to your lower back for 10-15 minutes every 2 hours for the next 2 days.  You should get rechecked if your symptoms are not better over the next 5 days,  Or you develop increased pain,  Weakness in your leg(s) or loss of bladder or bowel function - these are symptoms of a worse injury.

## 2015-03-22 NOTE — ED Notes (Signed)
Pt states was taking bp meds and was able to stop them after losing weight. Pt states has gained a bit of weight back.

## 2015-03-22 NOTE — ED Notes (Signed)
Pt states left hip and mid back pain for three weeks.

## 2015-03-24 NOTE — ED Provider Notes (Signed)
CSN: [id number]     Arrival date & time 03/22/15  2026 History   First MD Initiated Contact with Patient 03/22/15 2153     Chief Complaint  Patient presents with  . Back Pain     (Consider location/radiation/quality/duration/timing/severity/associated sxs/prior Treatment) The history is provided by the patient.   Destiny Petty is a 34 y.o. female with complaints of a 3 week history of slowly progressive low back pain with radiation into her left hip which constant but worsened with movement and lying on her left side.  She denies injury to her back.  There has been no weakness or numbness in the lower extremities and no urinary or bowel retention or incontinence.  Also denies fevers, chills, abdominal pain, urinary frequency. Patient does not have a history of cancer or IVDU.  The patient has tried ibuprofen without significant relief of symptoms. She reports similar episode occuring about 9 months ago, but only lasted a few days then resolved without intervention.      Past Medical History  Diagnosis Date  . Asthma   . Hypertension   . Anxiety   . Depression   . Obesity   . Perianal wart 02/10/2015  . Hemorrhoids 02/10/2015   Past Surgical History  Procedure Laterality Date  . Cesarean section    . Tubal ligation     Family History  Problem Relation Age of Onset  . Diabetes Mother   . Kidney failure Mother   . Depression Sister   . Hypertension Sister   . Anxiety disorder Sister   . Other Sister     back problems  . ADD / ADHD Daughter   . Other Daughter     behavioral issues  . ODD Daughter   . Asthma Son   . Cancer Maternal Grandmother   . Seizures Maternal Grandmother    History  Substance Use Topics  . Smoking status: Current Every Day Smoker -- 1.00 packs/day for 18 years    Types: Cigarettes  . Smokeless tobacco: Never Used  . Alcohol Use: Yes     Comment: occ   OB History    Gravida Para Term Preterm AB TAB SAB Ectopic Multiple Living   2 2        2      Review of Systems  Constitutional: Negative for fever.  Respiratory: Negative for shortness of breath.   Cardiovascular: Negative for chest pain and leg swelling.  Gastrointestinal: Negative for abdominal pain, constipation and abdominal distention.  Genitourinary: Negative for dysuria, urgency, frequency, flank pain and difficulty urinating.  Musculoskeletal: Positive for back pain. Negative for joint swelling and gait problem.  Skin: Negative for rash.  Neurological: Negative for weakness and numbness.      Allergies  Lisinopril  Home Medications   Prior to Admission medications   Medication Sig Start Date End Date Taking? Authorizing Provider  albuterol (PROVENTIL HFA;VENTOLIN HFA) 108 (90 BASE) MCG/ACT inhaler Inhale 2 puffs into the lungs every 4 (four) hours as needed.  11/25/12   Historical Provider, MD  albuterol (PROVENTIL) (2.5 MG/3ML) 0.083% nebulizer solution Inhale into the lungs as needed.  11/25/12   Historical Provider, MD  cyclobenzaprine (FLEXERIL) 5 MG tablet Take 1 tablet (5 mg total) by mouth 3 (three) times daily as needed for muscle spasms. 03/22/15   Burgess AmorJulie Natassia Guthridge, PA-C  HYDROcodone-acetaminophen (NORCO/VICODIN) 5-325 MG per tablet Take 1 tablet by mouth every 4 (four) hours as needed. 03/22/15   Burgess AmorJulie Lion Fernandez, PA-C  ibuprofen (ADVIL,MOTRIN) 800 MG  tablet 800 mg as needed.  01/16/15   Historical Provider, MD  imiquimod (ALDARA) 5 % cream Apply topically 3 (three) times a week. 02/10/15   [name], NP  naproxen (NAPROSYN) 500 MG tablet Take 1 tablet (500 mg total) by mouth 2 (two) times daily. 03/22/15   [name], PA-C   BP 137/79 mmHg  Pulse 78  Temp(Src) 97.8 F (36.6 C) (Oral)  Resp 20  Ht  (1.575 m)  Wt 228 lb 4 oz (103.534 kg)  BMI 41.74 kg/m2  SpO2 96%  LMP 03/04/2015 Physical Exam  Constitutional: She appears well-developed and well-nourished.  HENT:  Head: Normocephalic.  Eyes: Conjunctivae are normal.  Neck: Normal range of motion.  Neck supple.  Cardiovascular: Normal rate and intact distal pulses.   Pedal pulses normal.  Pulmonary/Chest: Effort normal.  Abdominal: Soft. Bowel sounds are normal. She exhibits no distension and no mass.  Musculoskeletal: Normal range of motion. She exhibits no edema.       Lumbar back: She exhibits tenderness. She exhibits no bony tenderness, no swelling, no edema and no spasm.  Left paralumbar ttp.  Neurological: She is alert. She has normal strength. She displays no atrophy and no tremor. No sensory deficit. Gait normal.  Reflex Scores:      Patellar reflexes are 2+ on the right side and 2+ on the left side.      Achilles reflexes are 2+ on the right side and 2+ on the left side. No strength deficit noted in hip and knee flexor and extensor muscle groups.  Ankle flexion and extension intact.  Skin: Skin is warm and dry.  Psychiatric: She has a normal mood and affect.  Nursing note and vitals reviewed.   ED Course  Procedures (including critical care time) Labs Review Labs Reviewed - No data to display  Imaging Review No results found.   EKG Interpretation None      MDM   Final diagnoses:  Lumbar strain, initial encounter    No neuro deficit on exam or by history to suggest emergent or surgical presentation.  Also discussed worsened sx that should prompt immediate re-evaluation including distal weakness, bowel/bladder retention/incontinence.  Pt prescribed flexeril, naproxen, hydrocodone.  Heat tx, activity as tolerated. F/u with pcp for recheck if not improved over the next week.        [name], PA-C 03/24/15 1315  Mancel Bale, MD 03/25/15 [phone]

## 2015-04-07 ENCOUNTER — Ambulatory Visit: Payer: BLUE CROSS/BLUE SHIELD | Admitting: Adult Health

## 2015-04-18 ENCOUNTER — Emergency Department (HOSPITAL_COMMUNITY): Payer: BLUE CROSS/BLUE SHIELD

## 2015-04-18 ENCOUNTER — Encounter (HOSPITAL_COMMUNITY): Payer: Self-pay | Admitting: *Deleted

## 2015-04-18 ENCOUNTER — Emergency Department (HOSPITAL_COMMUNITY)
Admission: EM | Admit: 2015-04-18 | Discharge: 2015-04-19 | Disposition: A | Discharge status: Home / Self Care | Payer: BLUE CROSS/BLUE SHIELD | Attending: Emergency Medicine | Admitting: Emergency Medicine

## 2015-04-18 DIAGNOSIS — Z79899 Other long term (current) drug therapy: Secondary | ICD-10-CM | POA: Diagnosis not present

## 2015-04-18 DIAGNOSIS — J45901 Unspecified asthma with (acute) exacerbation: Secondary | ICD-10-CM | POA: Insufficient documentation

## 2015-04-18 DIAGNOSIS — R05 Cough: Secondary | ICD-10-CM | POA: Insufficient documentation

## 2015-04-18 DIAGNOSIS — R079 Chest pain, unspecified: Secondary | ICD-10-CM | POA: Insufficient documentation

## 2015-04-18 DIAGNOSIS — E669 Obesity, unspecified: Secondary | ICD-10-CM | POA: Diagnosis not present

## 2015-04-18 DIAGNOSIS — Z8659 Personal history of other mental and behavioral disorders: Secondary | ICD-10-CM | POA: Diagnosis not present

## 2015-04-18 DIAGNOSIS — Z8719 Personal history of other diseases of the digestive system: Secondary | ICD-10-CM | POA: Diagnosis not present

## 2015-04-18 DIAGNOSIS — Z8619 Personal history of other infectious and parasitic diseases: Secondary | ICD-10-CM | POA: Diagnosis not present

## 2015-04-18 DIAGNOSIS — I10 Essential (primary) hypertension: Secondary | ICD-10-CM | POA: Diagnosis not present

## 2015-04-18 DIAGNOSIS — Z720 Tobacco use: Secondary | ICD-10-CM | POA: Insufficient documentation

## 2015-04-18 DIAGNOSIS — R509 Fever, unspecified: Secondary | ICD-10-CM | POA: Insufficient documentation

## 2015-04-18 DIAGNOSIS — R197 Diarrhea, unspecified: Secondary | ICD-10-CM | POA: Diagnosis not present

## 2015-04-18 LAB — BASIC METABOLIC PANEL
Anion gap: 8 (ref 5–15)
BUN: 12 mg/dL (ref 6–20)
CO2: 23 mmol/L (ref 22–32)
Calcium: 8.8 mg/dL — ABNORMAL LOW (ref 8.9–10.3)
Chloride: 108 mmol/L (ref 101–111)
Creatinine, Ser: 0.71 mg/dL (ref 0.44–1.00)
GFR calc Af Amer: >60 mL/min (ref >60)
GFR calc non Af Amer: >60 mL/min (ref >60)
Glucose, Bld: 111 mg/dL — ABNORMAL HIGH (ref 70–99)
Potassium: 3.7 mmol/L (ref 3.5–5.1)
Sodium: 139 mmol/L (ref 135–145)

## 2015-04-18 LAB — TROPONIN I: Troponin I: <0.03 ng/mL (ref <0.031)

## 2015-04-18 LAB — CBC
HCT: 39.9 % (ref 36.0–46.0)
Hemoglobin: 13.4 g/dL (ref 12.0–15.0)
MCH: 32.1 pg (ref 26.0–34.0)
MCHC: 33.6 g/dL (ref 30.0–36.0)
MCV: 95.5 fL (ref 78.0–100.0)
Platelets: 232 10*3/uL (ref 150–400)
RBC: 4.18 MIL/uL (ref 3.87–5.11)
RDW: 12.6 % (ref 11.5–15.5)
WBC: 9 10*3/uL (ref 4.0–10.5)

## 2015-04-18 LAB — PROTIME-INR
INR: 0.95 (ref 0.00–1.49)
Prothrombin Time: 12.8 seconds (ref 11.6–15.2)

## 2015-04-18 MED ORDER — TRAMADOL HCL 50 MG PO TABS: 50.0000 mg | ORAL_TABLET | Freq: Four times a day (QID) | ORAL | Status: DC | PRN

## 2015-04-18 MED ORDER — OXYCODONE-ACETAMINOPHEN 5-325 MG PO TABS
1.0000 | ORAL_TABLET | Freq: Once | ORAL | Status: AC
  Administered 2015-04-18: 1 via ORAL
  Filled 2015-04-18: qty 1

## 2015-04-18 MED ORDER — ALBUTEROL SULFATE HFA 108 (90 BASE) MCG/ACT IN AERS
2.0000 | INHALATION_SPRAY | Freq: Once | RESPIRATORY_TRACT | Status: AC
  Administered 2015-04-19: 2 via RESPIRATORY_TRACT
  Filled 2015-04-18: qty 6.7

## 2015-04-18 MED ORDER — IPRATROPIUM-ALBUTEROL 0.5-2.5 (3) MG/3ML IN SOLN
3.0000 mL | Freq: Once | RESPIRATORY_TRACT | Status: AC
  Administered 2015-04-18: 3 mL via RESPIRATORY_TRACT
  Filled 2015-04-18: qty 3

## 2015-04-18 MED ORDER — IBUPROFEN 400 MG PO TABS
600.0000 mg | ORAL_TABLET | Freq: Once | ORAL | Status: AC
  Administered 2015-04-18: 600 mg via ORAL
  Filled 2015-04-18: qty 2

## 2015-04-18 MED ORDER — DEXAMETHASONE 4 MG PO TABS
16.0000 mg | ORAL_TABLET | Freq: Once | ORAL | Status: AC
  Administered 2015-04-18: 16 mg via ORAL
  Filled 2015-04-18: qty 4

## 2015-04-18 NOTE — ED Notes (Signed)
Chest pain for 4 days with cough, green sputum, fever.  No sore throat.

## 2015-04-18 NOTE — ED Provider Notes (Signed)
CSN: [id number]     Arrival date & time 04/18/15  2131 History   None   This chart was scribed for Raeford RazorStephen Amariyon Maynes, MD by Marica OtterNusrat Rahman, ED Scribe. This patient was seen in room APA11/APA11 and the patient's care was started at 10:41 PM.  Chief Complaint  Patient presents with  . Chest Pain   HPI PCP: Avon GullyFANTA,TESFAYE, MD HPI Comments: Destiny Petty is a 34 y.o. female, with PMH noted below including asthma and daily tobacco use (1 ppd), who presents to the Emergency Department complaining of intermittent, atraumatic, worsening, 10/10, throbbing central chest pain with associated fever, chills, productive cough with white phlegm, diarrhea, SOB and wheezing onset 4 days ago. Pt notes her chest pain has been constant today and radiating to the left and right side of the chest. Pt denies sore throat, n/v, or any other Sx at this time. Pt notes any allergy to lisinopril only.   Pt reports a family Hx of heart problems noting that her father had open heart surgery.   Past Medical History  Diagnosis Date  . Asthma   . Hypertension   . Anxiety   . Depression   . Obesity   . Perianal wart 02/10/2015  . Hemorrhoids 02/10/2015   Past Surgical History  Procedure Laterality Date  . Cesarean section    . Tubal ligation     Family History  Problem Relation Age of Onset  . Diabetes Mother   . Kidney failure Mother   . Depression Sister   . Hypertension Sister   . Anxiety disorder Sister   . Other Sister     back problems  . ADD / ADHD Daughter   . Other Daughter     behavioral issues  . ODD Daughter   . Asthma Son   . Cancer Maternal Grandmother   . Seizures Maternal Grandmother    History  Substance Use Topics  . Smoking status: Current Every Day Smoker -- 1.00 packs/day for 18 years    Types: Cigarettes  . Smokeless tobacco: Never Used  . Alcohol Use: Yes     Comment: occ   OB History    Gravida Para Term Preterm AB TAB SAB Ectopic Multiple Living   2 2        2      Review of  Systems  Constitutional: Positive for fever and chills.  Respiratory: Positive for cough, shortness of breath and wheezing.   Cardiovascular: Positive for chest pain.  Gastrointestinal: Positive for diarrhea. Negative for nausea and vomiting.  Musculoskeletal: Negative for gait problem.  Neurological: Negative for speech difficulty.  Psychiatric/Behavioral: Negative for confusion.  All other systems reviewed and are negative.     Allergies  Lisinopril  Home Medications   Prior to Admission medications   Medication Sig Start Date End Date Taking? Authorizing Provider  albuterol (PROVENTIL HFA;VENTOLIN HFA) 108 (90 BASE) MCG/ACT inhaler Inhale 2 puffs into the lungs every 4 (four) hours as needed for wheezing or shortness of breath.  11/25/12  Yes Historical Provider, MD  albuterol (PROVENTIL) (2.5 MG/3ML) 0.083% nebulizer solution Inhale 2.5 mg into the lungs every 6 (six) hours as needed for wheezing or shortness of breath.  11/25/12  Yes Historical Provider, MD  HYDROcodone-acetaminophen (NORCO/VICODIN) 5-325 MG per tablet Take 1 tablet by mouth every 4 (four) hours as needed. Patient not taking: Reported on 04/18/2015 03/22/15   Burgess AmorJulie Idol, PA-C  imiquimod Mathis Dad(ALDARA) 5 % cream Apply topically 3 (three) times a week. Patient not taking:  Reported on 04/18/2015 02/10/15   Np, NP  naproxen (NAPROSYN) 500 MG tablet Take 1 tablet (500 mg total) by mouth 2 (two) times daily. Patient not taking: Reported on 04/18/2015 03/22/15   Burgess AmorJulie Idol, PA-C   Triage Vitals: BP 123/97 mmHg  Pulse 90  Temp(Src) 98.3 F (36.8 C) (Oral)  Resp 20  Ht 5\' 2"  (1.575 m)  Wt 220 lb (99.791 kg)  BMI 40.23 kg/m2  SpO2 96%  LMP 04/06/2015 Physical Exam  Constitutional: She is oriented to person, place, and time. She appears well-developed and well-nourished. No distress.  HENT:  Head: Normocephalic and atraumatic.  Eyes: Conjunctivae and EOM are normal.  Neck: Neck supple. No tracheal deviation present.   Cardiovascular: Normal rate.   Pulmonary/Chest: Effort normal. No respiratory distress. She has wheezes (mild expiratory wheezing). She exhibits tenderness (sternal tenderness to palpation).  Musculoskeletal: Normal range of motion. She exhibits no edema or tenderness.  No calf tenderness  Neurological: She is alert and oriented to person, place, and time.  Skin: Skin is warm and dry.  Psychiatric: She has a normal mood and affect. Her behavior is normal.  Nursing note and vitals reviewed.   ED Course  Procedures (including critical care time) DIAGNOSTIC STUDIES: Oxygen Saturation is 96% on RA, nl by my interpretation.    COORDINATION OF CARE: 10:45 PM-Discussed treatment plan which includes pain meds, labs, with pt at bedside and pt agreed to plan.   Labs Review Labs Reviewed  BASIC METABOLIC PANEL - Abnormal; Notable for the following:    Glucose, Bld 111 (*)    Calcium 8.8 (*)    All other components within normal limits  CBC  PROTIME-INR  TROPONIN I    Imaging Review Dg Chest 2 View  04/18/2015   CLINICAL DATA:  Sharp constant central chest pain for 4 days. Smoker.  EXAM: CHEST  2 VIEW  COMPARISON:  07/07/2013  FINDINGS: The heart size and mediastinal contours are within normal limits. Both lungs are clear. The visualized skeletal structures are unremarkable.  IMPRESSION: No active cardiopulmonary disease.   Electronically Signed   By: Burman NievesWilliam  Stevens M.D.   On: 04/18/2015 22:38     EKG Interpretation   Date/Time:  Tuesday Apr 18 2015 22:12:44 EDT Ventricular Rate:  81 PR Interval:  162 QRS Duration: 90 QT Interval:  391 QTC Calculation: 454 R Axis:   55 Text Interpretation:  Sinus rhythm Low voltage, precordial leads ED  PHYSICIAN INTERPRETATION AVAILABLE IN [facility] Confirmed by TEST,  Record ([id number]) on 04/20/2015 3:38:35 PM      MDM   Final diagnoses:  Chest pain, unspecified chest pain type   33yF with CP. Doubt ACS, dissection, PE or other  emergent process. It has been determined that no acute conditions requiring further emergency intervention are present at this time. The patient has been advised of the diagnosis and plan. I reviewed any labs and imaging including any potential incidental findings. We have discussed signs and symptoms that warrant return to the ED and they are listed in the discharge instructions.    I personally preformed the services scribed in my presence. The recorded information has been reviewed is accurate. Raeford RazorStephen Jaeleah Smyser, MD.    Raeford RazorStephen Ilija Maxim, MD 04/21/15 [phone]

## 2015-04-18 NOTE — Discharge Instructions (Signed)

## 2015-04-19 DIAGNOSIS — R079 Chest pain, unspecified: Secondary | ICD-10-CM | POA: Diagnosis not present

## 2015-04-19 NOTE — ED Notes (Signed)
Discharge instructions given, pt demonstrated teach back and verbal understanding. No concerns voiced.  

## 2015-05-04 ENCOUNTER — Emergency Department (HOSPITAL_COMMUNITY): Payer: Medicaid Other

## 2015-05-04 ENCOUNTER — Emergency Department (HOSPITAL_COMMUNITY)
Admission: EM | Admit: 2015-05-04 | Discharge: 2015-05-05 | Disposition: A | Discharge status: Home / Self Care | Payer: Medicaid Other | Attending: Emergency Medicine | Admitting: Emergency Medicine

## 2015-05-04 ENCOUNTER — Encounter (HOSPITAL_COMMUNITY): Payer: Self-pay

## 2015-05-04 DIAGNOSIS — Z79899 Other long term (current) drug therapy: Secondary | ICD-10-CM | POA: Diagnosis not present

## 2015-05-04 DIAGNOSIS — I10 Essential (primary) hypertension: Secondary | ICD-10-CM | POA: Diagnosis not present

## 2015-05-04 DIAGNOSIS — Z8719 Personal history of other diseases of the digestive system: Secondary | ICD-10-CM | POA: Diagnosis not present

## 2015-05-04 DIAGNOSIS — J45901 Unspecified asthma with (acute) exacerbation: Secondary | ICD-10-CM | POA: Insufficient documentation

## 2015-05-04 DIAGNOSIS — F419 Anxiety disorder, unspecified: Secondary | ICD-10-CM | POA: Insufficient documentation

## 2015-05-04 DIAGNOSIS — J159 Unspecified bacterial pneumonia: Secondary | ICD-10-CM | POA: Insufficient documentation

## 2015-05-04 DIAGNOSIS — Z720 Tobacco use: Secondary | ICD-10-CM | POA: Diagnosis not present

## 2015-05-04 DIAGNOSIS — R0602 Shortness of breath: Secondary | ICD-10-CM | POA: Diagnosis present

## 2015-05-04 DIAGNOSIS — F329 Major depressive disorder, single episode, unspecified: Secondary | ICD-10-CM | POA: Insufficient documentation

## 2015-05-04 DIAGNOSIS — Z8619 Personal history of other infectious and parasitic diseases: Secondary | ICD-10-CM | POA: Insufficient documentation

## 2015-05-04 DIAGNOSIS — E669 Obesity, unspecified: Secondary | ICD-10-CM | POA: Insufficient documentation

## 2015-05-04 DIAGNOSIS — J189 Pneumonia, unspecified organism: Secondary | ICD-10-CM

## 2015-05-04 MED ORDER — PREDNISONE 50 MG PO TABS
60.0000 mg | ORAL_TABLET | Freq: Once | ORAL | Status: AC
  Administered 2015-05-04: 60 mg via ORAL
  Filled 2015-05-04 (×2): qty 1

## 2015-05-04 MED ORDER — IPRATROPIUM-ALBUTEROL 0.5-2.5 (3) MG/3ML IN SOLN
3.0000 mL | Freq: Once | RESPIRATORY_TRACT | Status: AC
  Administered 2015-05-04: 3 mL via RESPIRATORY_TRACT

## 2015-05-04 MED ORDER — IPRATROPIUM-ALBUTEROL 0.5-2.5 (3) MG/3ML IN SOLN
3.0000 mL | Freq: Once | RESPIRATORY_TRACT | Status: DC
  Filled 2015-05-04: qty 3

## 2015-05-04 MED ORDER — HYDROCOD POLST-CPM POLST ER 10-8 MG/5ML PO SUER: 5.0000 mL | Freq: Two times a day (BID) | ORAL | Status: DC | PRN

## 2015-05-04 MED ORDER — PREDNISONE 50 MG PO TABS: ORAL_TABLET | ORAL | Status: DC

## 2015-05-04 MED ORDER — HYDROCOD POLST-CPM POLST ER 10-8 MG/5ML PO SUER
5.0000 mL | Freq: Once | ORAL | Status: AC
  Administered 2015-05-05: 5 mL via ORAL
  Filled 2015-05-04: qty 5

## 2015-05-04 MED ORDER — ALBUTEROL SULFATE HFA 108 (90 BASE) MCG/ACT IN AERS: 1.0000 | INHALATION_SPRAY | Freq: Four times a day (QID) | RESPIRATORY_TRACT | Status: DC | PRN

## 2015-05-04 MED ORDER — AZITHROMYCIN 250 MG PO TABS
500.0000 mg | ORAL_TABLET | Freq: Once | ORAL | Status: AC
  Administered 2015-05-04: 500 mg via ORAL
  Filled 2015-05-04: qty 2

## 2015-05-04 MED ORDER — AZITHROMYCIN 500 MG PO TABS: 500.0000 mg | ORAL_TABLET | Freq: Every day | ORAL | Status: DC

## 2015-05-04 NOTE — ED Provider Notes (Signed)
CSN: [id number]     Arrival date & time 05/04/15  1922 History  This chart was scribed for Destiny HutchingBrian [name], MD by Phillis HaggisGabriella Gaje, ED Scribe. This patient was seen in room APA11/APA11 and patient care was started at 10:26 PM.     Chief Complaint  Patient presents with  . Asthma   The history is provided by the patient. No language interpreter was used.    HPI Comments: Destiny Petty is a 34 y.o. female with a history of asthma who presents to the Emergency Department complaining of SOB onset 3 days ago. She states that she has associated wheezes and coughing; states that the wheezing will occur when she is "gasping for air." She reports right sided chest pain. She states that she has been to the hospital 3 times this year for the same problems. Patient states that she is a smoker. She states that she has not been prescribed any medications for her asthma. She states that she was given an inhaler upon her last visit less than a month ago and states that it did not help her.   Past Medical History  Diagnosis Date  . Asthma   . Hypertension   . Anxiety   . Depression   . Obesity   . Perianal wart 02/10/2015  . Hemorrhoids 02/10/2015   Past Surgical History  Procedure Laterality Date  . Cesarean section    . Tubal ligation     Family History  Problem Relation Age of Onset  . Diabetes Mother   . Kidney failure Mother   . Depression Sister   . Hypertension Sister   . Anxiety disorder Sister   . Other Sister     back problems  . ADD / ADHD Daughter   . Other Daughter     behavioral issues  . ODD Daughter   . Asthma Son   . Cancer Maternal Grandmother   . Seizures Maternal Grandmother    History  Substance Use Topics  . Smoking status: Current Every Day Smoker -- 1.00 packs/day for 18 years    Types: Cigarettes  . Smokeless tobacco: Never Used  . Alcohol Use: Yes     Comment: occ   OB History    Gravida Para Term Preterm AB TAB SAB Ectopic Multiple Living   2 2        2       Review of Systems  Respiratory: Positive for cough, shortness of breath and wheezing.   All other systems reviewed and are negative.  Allergies  Lisinopril  Home Medications   Prior to Admission medications   Medication Sig Start Date End Date Taking? Authorizing Provider  albuterol (PROVENTIL HFA;VENTOLIN HFA) 108 (90 BASE) MCG/ACT inhaler Inhale 1-2 puffs into the lungs every 6 (six) hours as needed for wheezing or shortness of breath. 05/04/15   Destiny HutchingBrian Elizabella Nolet, MD  azithromycin (ZITHROMAX) 500 MG tablet Take 1 tablet (500 mg total) by mouth daily. Start antibiotic Friday evening 05/04/15   Destiny HutchingBrian [name], MD  chlorpheniramine-HYDROcodone [facility](TUSSIONEX PENNKINETIC ER) 10-8 MG/5ML SUER Take 5 mLs by mouth every 12 (twelve) hours as needed for cough. 05/04/15   Destiny HutchingBrian Maykayla Highley, MD  HYDROcodone-acetaminophen (NORCO/VICODIN) 5-325 MG per tablet Take 1 tablet by mouth every 4 (four) hours as needed. Patient not taking: Reported on 04/18/2015 03/22/15   Burgess AmorJulie Idol, PA-C  imiquimod Mathis Dad(ALDARA) 5 % cream Apply topically 3 (three) times a week. Patient not taking: Reported on 04/18/2015 02/10/15   Adline PotterJennifer A Griffin, NP  naproxen (  NAPROSYN) 500 MG tablet Take 1 tablet (500 mg total) by mouth 2 (two) times daily. Patient not taking: Reported on 04/18/2015 03/22/15   [name], PA-C  predniSONE (DELTASONE) 50 MG tablet 1 tablet daily for 5 days, one half tablet daily for 5 days 05/04/15   Destiny Hutching, MD  traMADol (ULTRAM) 50 MG tablet Take 1 tablet (50 mg total) by mouth every 6 (six) hours as needed. Patient not taking: Reported on 05/04/2015 04/18/15   [name], MD   BP 121/72 mmHg  Pulse 85  Temp(Src) 98.2 F (36.8 C) (Oral)  Resp 22  Ht  (1.575 m)  Wt 226 lb (102.513 kg)  BMI 41.33 kg/m2  SpO2 97%  LMP 05/01/2015  Physical Exam  Constitutional: She is oriented to person, place, and time. She appears well-developed and well-nourished.  HENT:  Head: Normocephalic and atraumatic.  Eyes: Conjunctivae  and EOM are normal. Pupils are equal, round, and reactive to light.  Neck: Normal range of motion. Neck supple.  Cardiovascular: Normal rate and regular rhythm.   Pulmonary/Chest: Effort normal. She has wheezes.  Abdominal: Soft. Bowel sounds are normal.  Musculoskeletal: Normal range of motion.  Neurological: She is alert and oriented to person, place, and time.  Skin: Skin is warm and dry.  Psychiatric: She has a normal mood and affect. Her behavior is normal.  Nursing note and vitals reviewed.   ED Course  Procedures (including critical care time) DIAGNOSTIC STUDIES: Oxygen Saturation is 97% on room air, normal by my interpretation.    COORDINATION OF CARE: 10:29 PM-Discussed treatment plan which includes breathing treatment, cough medicine, anti-biotic and prednisone with pt at bedside and pt agreed to plan; discussed stopping tobacco use with patient   Labs Review Labs Reviewed - No data to display  Imaging Review Dg Chest 2 View (if Patient Has Fever And/or Copd)  05/04/2015   CLINICAL DATA:  Cough and shortness of breath for 3 days.  EXAM: CHEST  2 VIEW  COMPARISON:  04/18/2015 and 07/07/2013 radiographs  FINDINGS: The cardiomediastinal silhouette is unremarkable.  Mild peribronchial thickening again noted.  Subsegmental atelectasis within the anterior mid lung identified on lateral view.  Right middle lobe basilar opacity may represent atelectasis or early airspace disease.  There is no evidence of pulmonary edema, suspicious pulmonary nodule/mass, pleural effusion, or pneumothorax. No acute bony abnormalities are identified.  IMPRESSION: Right middle lobe basilar opacity -question atelectasis versus airspace disease/pneumonia.  Subsegmental atelectasis within the anterior mid lung on the lateral view.   Electronically Signed   By: Harmon Pier M.D.   On: 05/04/2015 19:58     EKG Interpretation None      MDM   Final diagnoses:  Community acquired pneumonia   History of  physical consistent with community-acquired pneumonia. Chest x-ray confirms same. She is oxygenating well. Discharge medications Zithromax, Tussionex, prednisone, albuterol inhaler  I personally performed the services described in this documentation, which was scribed in my presence. The recorded information has been reviewed and is accurate.     Destiny Hutching, MD 05/05/15 [phone]

## 2015-05-04 NOTE — ED Notes (Signed)
Patient states she starting having an "asthma flair up" starting 3 days ago. Patient states today got worse.

## 2015-05-04 NOTE — Discharge Instructions (Signed)
Quit smoking. Start anabiotic evening. Prescription for cough syrup, inhaler, prednisone. Increase fluids.

## 2016-01-15 DIAGNOSIS — F319 Bipolar disorder, unspecified: Secondary | ICD-10-CM

## 2016-01-15 DIAGNOSIS — F339 Major depressive disorder, recurrent, unspecified: Secondary | ICD-10-CM | POA: Insufficient documentation

## 2016-01-15 DIAGNOSIS — G43909 Migraine, unspecified, not intractable, without status migrainosus: Secondary | ICD-10-CM | POA: Insufficient documentation

## 2016-01-15 DIAGNOSIS — K449 Diaphragmatic hernia without obstruction or gangrene: Secondary | ICD-10-CM | POA: Insufficient documentation

## 2016-01-15 DIAGNOSIS — F3342 Major depressive disorder, recurrent, in full remission: Secondary | ICD-10-CM | POA: Insufficient documentation

## 2016-01-15 HISTORY — DX: Migraine, unspecified, not intractable, without status migrainosus: G43.909

## 2016-01-15 HISTORY — DX: Bipolar disorder, unspecified: F31.9

## 2016-01-21 ENCOUNTER — Emergency Department (HOSPITAL_COMMUNITY)
Admission: EM | Admit: 2016-01-21 | Discharge: 2016-01-21 | Disposition: A | Discharge status: Home / Self Care | Payer: BLUE CROSS/BLUE SHIELD | Attending: Emergency Medicine | Admitting: Emergency Medicine

## 2016-01-21 ENCOUNTER — Encounter (HOSPITAL_COMMUNITY): Payer: Self-pay | Admitting: Emergency Medicine

## 2016-01-21 DIAGNOSIS — I10 Essential (primary) hypertension: Secondary | ICD-10-CM | POA: Diagnosis not present

## 2016-01-21 DIAGNOSIS — K5900 Constipation, unspecified: Secondary | ICD-10-CM | POA: Diagnosis not present

## 2016-01-21 DIAGNOSIS — Z792 Long term (current) use of antibiotics: Secondary | ICD-10-CM | POA: Diagnosis not present

## 2016-01-21 DIAGNOSIS — R112 Nausea with vomiting, unspecified: Secondary | ICD-10-CM | POA: Insufficient documentation

## 2016-01-21 DIAGNOSIS — Z91040 Latex allergy status: Secondary | ICD-10-CM | POA: Insufficient documentation

## 2016-01-21 DIAGNOSIS — R1010 Upper abdominal pain, unspecified: Secondary | ICD-10-CM | POA: Diagnosis present

## 2016-01-21 DIAGNOSIS — F17210 Nicotine dependence, cigarettes, uncomplicated: Secondary | ICD-10-CM | POA: Diagnosis not present

## 2016-01-21 DIAGNOSIS — R079 Chest pain, unspecified: Secondary | ICD-10-CM | POA: Diagnosis not present

## 2016-01-21 DIAGNOSIS — R1013 Epigastric pain: Secondary | ICD-10-CM

## 2016-01-21 DIAGNOSIS — J45909 Unspecified asthma, uncomplicated: Secondary | ICD-10-CM | POA: Diagnosis not present

## 2016-01-21 DIAGNOSIS — Z8659 Personal history of other mental and behavioral disorders: Secondary | ICD-10-CM | POA: Insufficient documentation

## 2016-01-21 DIAGNOSIS — R509 Fever, unspecified: Secondary | ICD-10-CM | POA: Insufficient documentation

## 2016-01-21 DIAGNOSIS — E669 Obesity, unspecified: Secondary | ICD-10-CM | POA: Insufficient documentation

## 2016-01-21 DIAGNOSIS — Z79899 Other long term (current) drug therapy: Secondary | ICD-10-CM | POA: Insufficient documentation

## 2016-01-21 HISTORY — DX: Diaphragmatic hernia without obstruction or gangrene: K44.9

## 2016-01-21 LAB — URINALYSIS, ROUTINE W REFLEX MICROSCOPIC
BILIRUBIN URINE: NEGATIVE
Glucose, UA: NEGATIVE mg/dL
Hgb urine dipstick: NEGATIVE
KETONES UR: NEGATIVE mg/dL
LEUKOCYTES UA: NEGATIVE
NITRITE: NEGATIVE
Protein, ur: NEGATIVE mg/dL
pH: 6 (ref 5.0–8.0)

## 2016-01-21 LAB — COMPREHENSIVE METABOLIC PANEL
ALT: 13 U/L — AB (ref 14–54)
ANION GAP: 7 (ref 5–15)
AST: 18 U/L (ref 15–41)
Albumin: 4.5 g/dL (ref 3.5–5.0)
Alkaline Phosphatase: 67 U/L (ref 38–126)
BUN: 16 mg/dL (ref 6–20)
CHLORIDE: 109 mmol/L (ref 101–111)
CO2: 23 mmol/L (ref 22–32)
CREATININE: 0.88 mg/dL (ref 0.44–1.00)
Calcium: 9.2 mg/dL (ref 8.9–10.3)
Glucose, Bld: 87 mg/dL (ref 65–99)
Potassium: 4 mmol/L (ref 3.5–5.1)
SODIUM: 139 mmol/L (ref 135–145)
Total Bilirubin: 0.5 mg/dL (ref 0.3–1.2)
Total Protein: 7.3 g/dL (ref 6.5–8.1)

## 2016-01-21 LAB — CBC WITH DIFFERENTIAL/PLATELET
BASOS ABS: 0 10*3/uL (ref 0.0–0.1)
Basophils Relative: 0 %
Eosinophils Absolute: 0.3 10*3/uL (ref 0.0–0.7)
Eosinophils Relative: 3 %
HEMATOCRIT: 44 % (ref 36.0–46.0)
HEMOGLOBIN: 15 g/dL (ref 12.0–15.0)
LYMPHS PCT: 32 %
Lymphs Abs: 3 10*3/uL (ref 0.7–4.0)
MCH: 32.7 pg (ref 26.0–34.0)
MCHC: 34.1 g/dL (ref 30.0–36.0)
MCV: 95.9 fL (ref 78.0–100.0)
MONO ABS: 0.7 10*3/uL (ref 0.1–1.0)
Monocytes Relative: 8 %
NEUTROS PCT: 57 %
Neutro Abs: 5.5 10*3/uL (ref 1.7–7.7)
Platelets: 234 10*3/uL (ref 150–400)
RBC: 4.59 MIL/uL (ref 3.87–5.11)
RDW: 12.6 % (ref 11.5–15.5)
WBC: 9.6 10*3/uL (ref 4.0–10.5)

## 2016-01-21 LAB — LIPASE, BLOOD: Lipase: 25 U/L (ref 11–51)

## 2016-01-21 MED ORDER — ONDANSETRON HCL 4 MG/2ML IJ SOLN
4.0000 mg | Freq: Once | INTRAMUSCULAR | Status: AC
  Administered 2016-01-21: 4 mg via INTRAVENOUS
  Filled 2016-01-21: qty 2

## 2016-01-21 MED ORDER — GI COCKTAIL ~~LOC~~
30.0000 mL | Freq: Once | ORAL | Status: AC
  Administered 2016-01-21: 30 mL via ORAL
  Filled 2016-01-21: qty 30

## 2016-01-21 MED ORDER — TRAMADOL HCL 50 MG PO TABS: 50.0000 mg | ORAL_TABLET | Freq: Four times a day (QID) | ORAL | Status: DC | PRN

## 2016-01-21 MED ORDER — SODIUM CHLORIDE 0.9 % IV BOLUS (SEPSIS)
1000.0000 mL | Freq: Once | INTRAVENOUS | Status: AC
  Administered 2016-01-21: 1000 mL via INTRAVENOUS

## 2016-01-21 MED ORDER — HYDROMORPHONE HCL 1 MG/ML IJ SOLN
1.0000 mg | Freq: Once | INTRAMUSCULAR | Status: AC
  Administered 2016-01-21: 1 mg via INTRAVENOUS
  Filled 2016-01-21: qty 1

## 2016-01-21 MED ORDER — PROMETHAZINE HCL 25 MG PO TABS: 25.0000 mg | ORAL_TABLET | Freq: Four times a day (QID) | ORAL | Status: DC | PRN

## 2016-01-21 MED ORDER — FAMOTIDINE IN NACL 20-0.9 MG/50ML-% IV SOLN
20.0000 mg | Freq: Once | INTRAVENOUS | Status: AC
  Administered 2016-01-21: 20 mg via INTRAVENOUS
  Filled 2016-01-21: qty 50

## 2016-01-21 MED ORDER — FAMOTIDINE 20 MG PO TABS: 20.0000 mg | ORAL_TABLET | Freq: Two times a day (BID) | ORAL | Status: DC

## 2016-01-21 NOTE — ED Notes (Addendum)
Patient c/o upper abd pain with nausea and vomiting with eating. Per patient intermittent fevers and constipation. Patient states this pain has been x1 month. Patient reports hx of hiatal hernia. Patient is to have another endoscopy.

## 2016-01-21 NOTE — ED Provider Notes (Signed)
CSN: [id number]     Arrival date & time 01/21/16  1836 History  By signing my name below, I, Linus Galas, attest that this documentation has been prepared under the direction and in the presence of [name], MD. Electronically Signed: Linus Galas, ED Scribe. 01/21/2016. 8:33 PM.   Chief Complaint  Patient presents with  . Abdominal Pain   The history is provided by the patient. No language interpreter was used.   HPI Comments: Destiny Petty is a 35 y.o. female with a PMHx Hiatal Hernia, and HTN who presents to the Emergency Department complaining of constant worsening upper abdominal pain for the past one month. Pt reports worsening abdominal pain after eating. She denies worsening pain at night or any pain radiation. Pt states that GI preformed an endoscopy a few days ago who found a "hernia the size of her fist." Pt also reports nausea, vomiting, intermittent fevers, constipation, decrease in appetite, and chest pain.  Pt has been taking 800 mg of ibuprofen 2-3 times a day for the past three weeks for pain. Pt also takes antinausea medication and Prilosec. Pt denies having a bad taste in her mouth or any other symptoms at this time. Current smoker   Past Medical History  Diagnosis Date  . Asthma   . Hypertension   . Anxiety   . Depression   . Obesity   . Perianal wart 02/10/2015  . Hemorrhoids 02/10/2015  . Hiatal hernia    Past Surgical History  Procedure Laterality Date  . Cesarean section    . Tubal ligation    . Esophagogastroduodenoscopy endoscopy    . Colonoscopy     Family History  Problem Relation Age of Onset  . Diabetes Mother   . Kidney failure Mother   . Depression Sister   . Hypertension Sister   . Anxiety disorder Sister   . Other Sister     back problems  . ADD / ADHD Daughter   . Other Daughter     behavioral issues  . ODD Daughter   . Asthma Son   . Cancer Maternal Grandmother   . Seizures Maternal Grandmother    Social History  Substance  Use Topics  . Smoking status: Current Every Day Smoker -- 1.00 packs/day for 18 years    Types: Cigarettes  . Smokeless tobacco: Never Used  . Alcohol Use: Yes     Comment: occ   OB History    Gravida Para Term Preterm AB TAB SAB Ectopic Multiple Living   Review of Systems  Constitutional: Positive for fever and appetite change (decreased).  Cardiovascular: Positive for chest pain.  Gastrointestinal: Positive for nausea, vomiting, abdominal pain and constipation.  All other systems reviewed and are negative.   Allergies  Latex and Lisinopril  Home Medications   Prior to Admission medications   Medication Sig Start Date End Date Taking? Authorizing Provider  ibuprofen (ADVIL,MOTRIN) 800 MG tablet Take 800 mg by mouth every 8 (eight) hours as needed.   Yes Historical Provider, MD  Omega-3 Fatty Acids (FISH OIL PO) Take 1 capsule by mouth 3 (three) times daily.   Yes Historical Provider, MD  omeprazole (PRILOSEC) 20 MG capsule Take 60 mg by mouth daily.   Yes Historical Provider, MD  ondansetron (ZOFRAN-ODT) 8 MG disintegrating tablet Take 8 mg by mouth every 8 (eight) hours as needed for nausea or vomiting.   Yes Historical Provider, MD  phentermine 37.5 MG capsule Take 37.5 mg by mouth every morning.   Yes Historical Provider, MD  albuterol (PROVENTIL HFA;VENTOLIN HFA) 108 (90 BASE) MCG/ACT inhaler Inhale 1-2 puffs into the lungs every 6 (six) hours as needed for wheezing or shortness of breath. 05/04/15   Donnetta Hutching, MD  azithromycin (ZITHROMAX) 500 MG tablet Take 1 tablet (500 mg total) by mouth daily. Start antibiotic Friday evening 05/04/15   Donnetta Hutching, MD  chlorpheniramine-HYDROcodone [facility]) 10-8 MG/5ML SUER Take 5 mLs by mouth every 12 (twelve) hours as needed for cough. 05/04/15   Donnetta Hutching, MD  HYDROcodone-acetaminophen (NORCO/VICODIN) 5-325 MG per tablet Take 1 tablet by mouth every 4 (four) hours as needed. Patient not taking: Reported  on 04/18/2015 03/22/15   [name], PA-C  imiquimod Mathis Dad) 5 % cream Apply topically 3 (three) times a week. Patient not taking: Reported on 04/18/2015 02/10/15   [name], NP  naproxen (NAPROSYN) 500 MG tablet Take 1 tablet (500 mg total) by mouth 2 (two) times daily. Patient not taking: Reported on 04/18/2015 03/22/15   [name], PA-C  predniSONE (DELTASONE) 50 MG tablet 1 tablet daily for 5 days, one half tablet daily for 5 days 05/04/15   Donnetta Hutching, MD  traMADol (ULTRAM) 50 MG tablet Take 1 tablet (50 mg total) by mouth every 6 (six) hours as needed. Patient not taking: Reported on 05/04/2015 04/18/15   [name], MD   BP 139/96 mmHg  Pulse 79  Temp(Src) 98.2 F (36.8 C) (Oral)  Resp 16  Ht  (1.575 m)  SpO2 98%  LMP 01/14/2016   Physical Exam  Constitutional: She is oriented to person, place, and time. She appears well-developed and well-nourished. No distress.  HENT:  Head: Normocephalic and atraumatic.  Mouth/Throat: Oropharynx is clear and moist. No oropharyngeal exudate.  Eyes: Conjunctivae and EOM are normal. Pupils are equal, round, and reactive to light.  Neck: Normal range of motion. Neck supple.  No meningismus.  Cardiovascular: Normal rate, regular rhythm, normal heart sounds and intact distal pulses.   No murmur heard. Pulmonary/Chest: Effort normal and breath sounds normal. No respiratory distress.  Abdominal: Soft. There is tenderness (mild epigastrium). There is no rebound and no guarding.  Musculoskeletal: Normal range of motion. She exhibits no edema or tenderness.  Neurological: She is alert and oriented to person, place, and time. No cranial nerve deficit. She exhibits normal muscle tone. Coordination normal.  No ataxia on finger to nose bilaterally. No pronator drift. 5/5 strength throughout. CN 2-12 intact.Equal grip strength. Sensation intact.   Skin: Skin is warm.  Psychiatric: She has a normal mood and affect. Her behavior is normal.   Nursing note and vitals reviewed.   ED Course  Procedures  DIAGNOSTIC STUDIES: Oxygen Saturation is 98% on room air, normal by my interpretation.    COORDINATION OF CARE: 8:19 PM Will give fluids, GI cocktail, Pepcid, pain medication, and Zofran.Will order blood work and Urinalysis. Discussed treatment plan with pt at bedside and pt agreed to plan.   Labs Review Labs Reviewed  COMPREHENSIVE METABOLIC PANEL - Abnormal; Notable for the following:    ALT 13 (*)    All other components within normal limits  URINALYSIS, ROUTINE W REFLEX MICROSCOPIC (NOT AT [facility]) - Abnormal; Notable for the following:    Specific Gravity, Urine >1.030 (*)    All other components within normal limits  CBC WITH DIFFERENTIAL/PLATELET  LIPASE, BLOOD    MDM   Final diagnoses:  Epigastric pain    34yF with abdominal pain. Suspect gastritis or PUD. Overuses NSAIDs. Advised to stop or at least significantly limit. Has GI follow-up.   I personally preformed the services scribed in my presence. The recorded information has been reviewed is accurate. [name], MD.   [name], MD 01/27/16 [phone]

## 2016-01-21 NOTE — Discharge Instructions (Signed)
Follow-up with gastroenterology. You need to really limit the amount of NSAIDs (ibuprofen, aleve, naprosyn, advil, etc) you take. This is likely exacerbating your symptoms. Try to stop smoking. Follow-up with your GI doctor.   Abdominal Pain, Adult Many things can cause abdominal pain. Usually, abdominal pain is not caused by a disease and will improve without treatment. It can often be observed and treated at home. Your health care provider will do a physical exam and possibly order blood tests and X-rays to help determine the seriousness of your pain. However, in many cases, more time must pass before a clear cause of the pain can be found. Before that point, your health care provider may not know if you need more testing or further treatment. HOME CARE INSTRUCTIONS Monitor your abdominal pain for any changes. The following actions may help to alleviate any discomfort you are experiencing:  Only take over-the-counter or prescription medicines as directed by your health care provider.  Do not take laxatives unless directed to do so by your health care provider.  Try a clear liquid diet (broth, tea, or water) as directed by your health care provider. Slowly move to a bland diet as tolerated. SEEK MEDICAL CARE IF:  You have unexplained abdominal pain.  You have abdominal pain associated with nausea or diarrhea.  You have pain when you urinate or have a bowel movement.  You experience abdominal pain that wakes you in the night.  You have abdominal pain that is worsened or improved by eating food.  You have abdominal pain that is worsened with eating fatty foods.  You have a fever. SEEK IMMEDIATE MEDICAL CARE IF:  Your pain does not go away within 2 hours.  You keep throwing up (vomiting).  Your pain is felt only in portions of the abdomen, such as the right side or the left lower portion of the abdomen.  You pass bloody or black tarry stools. MAKE SURE YOU:  Understand these  instructions.  Will watch your condition.  Will get help right away if you are not doing well or get worse.   This information is not intended to replace advice given to you by your health care provider. Make sure you discuss any questions you have with your health care provider.   Document Released: 09/04/2005 Document Revised: 08/16/2015 Document Reviewed: 08/04/2013 Elsevier Interactive Patient Education [employer]

## 2016-01-23 ENCOUNTER — Other Ambulatory Visit: Payer: Self-pay | Admitting: Gastroenterology

## 2016-01-23 DIAGNOSIS — R112 Nausea with vomiting, unspecified: Secondary | ICD-10-CM

## 2016-01-24 ENCOUNTER — Ambulatory Visit (HOSPITAL_COMMUNITY)
Admission: RE | Admit: 2016-01-24 | Discharge: 2016-01-24 | Disposition: A | Discharge status: Home / Self Care | Payer: BLUE CROSS/BLUE SHIELD | Source: Ambulatory Visit | Attending: Gastroenterology | Admitting: Gastroenterology

## 2016-01-24 DIAGNOSIS — K8020 Calculus of gallbladder without cholecystitis without obstruction: Secondary | ICD-10-CM | POA: Diagnosis not present

## 2016-01-24 DIAGNOSIS — R112 Nausea with vomiting, unspecified: Secondary | ICD-10-CM | POA: Insufficient documentation

## 2016-01-24 DIAGNOSIS — R1011 Right upper quadrant pain: Secondary | ICD-10-CM | POA: Diagnosis not present

## 2016-01-27 ENCOUNTER — Encounter (HOSPITAL_COMMUNITY): Payer: Self-pay

## 2016-01-27 ENCOUNTER — Emergency Department (HOSPITAL_COMMUNITY)
Admission: EM | Admit: 2016-01-27 | Discharge: 2016-01-28 | Disposition: A | Discharge status: Home / Self Care | Payer: BLUE CROSS/BLUE SHIELD | Attending: Emergency Medicine | Admitting: Emergency Medicine

## 2016-01-27 DIAGNOSIS — R1013 Epigastric pain: Secondary | ICD-10-CM | POA: Diagnosis present

## 2016-01-27 DIAGNOSIS — Z8659 Personal history of other mental and behavioral disorders: Secondary | ICD-10-CM | POA: Insufficient documentation

## 2016-01-27 DIAGNOSIS — Z91040 Latex allergy status: Secondary | ICD-10-CM | POA: Diagnosis not present

## 2016-01-27 DIAGNOSIS — Z3202 Encounter for pregnancy test, result negative: Secondary | ICD-10-CM | POA: Diagnosis not present

## 2016-01-27 DIAGNOSIS — K8020 Calculus of gallbladder without cholecystitis without obstruction: Secondary | ICD-10-CM | POA: Insufficient documentation

## 2016-01-27 DIAGNOSIS — F17210 Nicotine dependence, cigarettes, uncomplicated: Secondary | ICD-10-CM | POA: Diagnosis not present

## 2016-01-27 DIAGNOSIS — J45909 Unspecified asthma, uncomplicated: Secondary | ICD-10-CM | POA: Diagnosis not present

## 2016-01-27 DIAGNOSIS — E669 Obesity, unspecified: Secondary | ICD-10-CM | POA: Insufficient documentation

## 2016-01-27 DIAGNOSIS — I10 Essential (primary) hypertension: Secondary | ICD-10-CM | POA: Diagnosis not present

## 2016-01-27 DIAGNOSIS — Z8619 Personal history of other infectious and parasitic diseases: Secondary | ICD-10-CM | POA: Diagnosis not present

## 2016-01-27 DIAGNOSIS — K5900 Constipation, unspecified: Secondary | ICD-10-CM | POA: Diagnosis not present

## 2016-01-27 LAB — URINE MICROSCOPIC-ADD ON

## 2016-01-27 LAB — URINALYSIS, ROUTINE W REFLEX MICROSCOPIC
Bilirubin Urine: NEGATIVE
GLUCOSE, UA: NEGATIVE mg/dL
HGB URINE DIPSTICK: NEGATIVE
LEUKOCYTES UA: NEGATIVE
Nitrite: NEGATIVE
PROTEIN: 100 mg/dL — AB
Specific Gravity, Urine: >1.03 — ABNORMAL HIGH (ref 1.005–1.030)
pH: 6 (ref 5.0–8.0)

## 2016-01-27 MED ORDER — SODIUM CHLORIDE 0.9 % IV BOLUS (SEPSIS)
1000.0000 mL | Freq: Once | INTRAVENOUS | Status: AC
  Administered 2016-01-28: 1000 mL via INTRAVENOUS

## 2016-01-27 MED ORDER — ONDANSETRON HCL 4 MG/2ML IJ SOLN
4.0000 mg | Freq: Once | INTRAMUSCULAR | Status: AC
  Administered 2016-01-28: 4 mg via INTRAVENOUS
  Filled 2016-01-27: qty 2

## 2016-01-27 MED ORDER — MORPHINE SULFATE (PF) 4 MG/ML IV SOLN
4.0000 mg | Freq: Once | INTRAVENOUS | Status: AC
  Administered 2016-01-28: 4 mg via INTRAVENOUS
  Filled 2016-01-27: qty 1

## 2016-01-27 NOTE — ED Notes (Signed)
Patient via [facility] c/o upper abdominal pain, dizziness and constipation. Patient denies LOC, negative orthostatic vitals with EMS. Patient is scheduled to have her gallbladder removed next week. A&OX4 at this time.

## 2016-01-27 NOTE — ED Provider Notes (Signed)
By signing my name below, I, Doreatha Martin, attest that this documentation has been prepared under the direction and in the presence of Roque Schill N Montserrat Shek, DO. Electronically Signed: Doreatha Martin, ED Scribe. 01/27/2016. 12:01 AM.   TIME SEEN: 11:49 PM   CHIEF COMPLAINT:  Chief Complaint  Patient presents with  . Abdominal Pain    HPI:  HPI Comments: Destiny Petty is a 35 y.o. female with h/o HTN, obesity, hiatal hernia who presents to the Emergency Department complaining of moderate, sharp epigastric and RUQ abdominal pain for 1.5 months with associated decreased appetite, nausea, emesis, intermittent fever (Tmax 102 3 days ago), constipation. Pt states she was seen by her PCP for her symptoms and was referred to Dr. Loreta Ave with general surgery. She states she followed up with Dr. Loreta Ave 5 days ago and had US showing gallstones. She notes her cholecystectomy is scheduled in 4 days. Pt states she has been taking Tylenol with mild relief of pain. She states her pain is worsened with eating. LMP 3 weeks ago. No h/o abdominal surgery. She denies diarrhea, dysuria, vaginal bleeding or discharge.   ROS: See HPI Constitutional: Positive for fever, decreased appetite  Eyes: no drainage  ENT: no runny nose   Cardiovascular:  no chest pain  Resp: no SOB  GI: no diarrhea. Positive for vomiting, nausea, abdominal pain, constipation  GU: no dysuria, vaginal bleeding or discharge Integumentary: no rash  Allergy: no hives  Musculoskeletal: no leg swelling  Neurological: no slurred speech ROS otherwise negative  PAST MEDICAL HISTORY/PAST SURGICAL HISTORY:  Past Medical History  Diagnosis Date  . Asthma   . Hypertension   . Anxiety   . Depression   . Obesity   . Perianal wart 02/10/2015  . Hemorrhoids 02/10/2015  . Hiatal hernia     MEDICATIONS:  Prior to Admission medications   Medication Sig Start Date End Date Taking? Authorizing Provider  famotidine (PEPCID) 20 MG tablet Take 1 tablet (20 mg total)  by mouth 2 (two) times daily. 01/21/16   [name], MD  ibuprofen (ADVIL,MOTRIN) 800 MG tablet Take 800 mg by mouth every 8 (eight) hours as needed.    Historical Provider, MD  Omega-3 Fatty Acids (FISH OIL PO) Take 1 capsule by mouth 3 (three) times daily.    Historical Provider, MD  omeprazole (PRILOSEC) 20 MG capsule Take 60 mg by mouth daily.    Historical Provider, MD  ondansetron (ZOFRAN-ODT) 8 MG disintegrating tablet Take 8 mg by mouth every 8 (eight) hours as needed for nausea or vomiting.    Historical Provider, MD  phentermine 37.5 MG capsule Take 37.5 mg by mouth every morning.    Historical Provider, MD  promethazine (PHENERGAN) 25 MG tablet Take 1 tablet (25 mg total) by mouth every 6 (six) hours as needed for nausea or vomiting. 01/21/16   [name], MD  traMADol (ULTRAM) 50 MG tablet Take 1 tablet (50 mg total) by mouth every 6 (six) hours as needed. 01/21/16   [name], MD    ALLERGIES:  Allergies  Allergen Reactions  . Latex Itching  . Lisinopril Other (See Comments)    coughing    SOCIAL HISTORY:  Social History  Substance Use Topics  . Smoking status: Current Every Day Smoker -- 1.00 packs/day for 18 years    Types: Cigarettes  . Smokeless tobacco: Never Used  . Alcohol Use: Yes     Comment: occ    FAMILY HISTORY: Family History  Problem Relation Age of  Onset  . Diabetes Mother   . Kidney failure Mother   . Depression Sister   . Hypertension Sister   . Anxiety disorder Sister   . Other Sister     back problems  . ADD / ADHD Daughter   . Other Daughter     behavioral issues  . ODD Daughter   . Asthma Son   . Cancer Maternal Grandmother   . Seizures Maternal Grandmother     EXAM: BP 114/73 mmHg  Pulse 87  Temp(Src) 97.6 F (36.4 C) (Oral)  Resp 20  Ht  (1.575 m)  Wt 205 lb (92.987 kg)  BMI 37.49 kg/m2  SpO2 98%  LMP 01/14/2016 CONSTITUTIONAL: Alert and oriented and responds appropriately to questions. Well-appearing;  well-nourished. Obese. Afebrile, non-toxic.  HEAD: Normocephalic EYES: Conjunctivae clear, PERRL ENT: normal nose; no rhinorrhea; moist mucous membranes; pharynx without lesions noted NECK: Supple, no meningismus, no LAD  CARD: RRR; S1 and S2 appreciated; no murmurs, no clicks, no rubs, no gallops RESP: Normal chest excursion without splinting or tachypnea; breath sounds clear and equal bilaterally; no wheezes, no rhonchi, no rales, no hypoxia or respiratory distress, speaking full sentences ABD/GI: Tender to palpation to epigastric region and RUQ with a negative Murphy's sign. Normal bowel sounds; non-distended; soft, no rebound, no guarding, no peritoneal signs; no tenderness at McBurney's point BACK:  The back appears normal and is non-tender to palpation, there is no CVA tenderness EXT: Normal ROM in all joints; non-tender to palpation; no edema; normal capillary refill; no cyanosis, no calf tenderness or swelling    SKIN: Normal color for age and race; warm NEURO: Moves all extremities equally, sensation to light touch intact diffusely, cranial nerves II through XII intact PSYCH: The patient's mood and manner are appropriate. Grooming and personal hygiene are appropriate.  MEDICAL DECISION MAKING: Pt here with biliary colic. States she was not discharged with any pain medication. Denies placed or melena. Has had nausea but no vomiting. States she did have fever but this was 3 days ago. On exam she is tender in the right upper corner has a negative Murphy sign. No tenderness at McBurney's point. Suspect biliary colic. Low suspicion for cholecystitis or choledocholithiasis. Will repeat labs. She last had an ultrasound of her gallbladder 3 days ago. At this time I do not feel this needs to be repeated emergently. She has cholecystectomy scheduled as an outpatient in 4 days.  ED PROGRESS: Patient does have a mild leukocytosis of 11.3 with left shift. LFTs, lipase normal. She is not pregnant. Pain is  well-controlled after IV morphine and allotted. She has not had any vomiting. Additional she is 58 discharged home with close outpatient follow-up with her surgeon and she agrees with this plan. We will discharge her Percocet and Zofran to take as needed. Discussed return precautions. She verbalizes understanding and is comfortable with this plan.    I personally performed the services described in this documentation, which was scribed in my presence. The recorded information has been reviewed and is accurate.   Layla Maw Oaklee Esther, DO 01/28/16 [phone]

## 2016-01-28 LAB — COMPREHENSIVE METABOLIC PANEL
ALBUMIN: 3.7 g/dL (ref 3.5–5.0)
ALK PHOS: 64 U/L (ref 38–126)
ALT: 13 U/L — AB (ref 14–54)
ANION GAP: 6 (ref 5–15)
AST: 15 U/L (ref 15–41)
BILIRUBIN TOTAL: 0.3 mg/dL (ref 0.3–1.2)
BUN: 14 mg/dL (ref 6–20)
CALCIUM: 8.5 mg/dL — AB (ref 8.9–10.3)
CO2: 24 mmol/L (ref 22–32)
CREATININE: 0.73 mg/dL (ref 0.44–1.00)
Chloride: 108 mmol/L (ref 101–111)
GFR calc Af Amer: >60 mL/min (ref >60)
GFR calc non Af Amer: >60 mL/min (ref >60)
GLUCOSE: 93 mg/dL (ref 65–99)
Potassium: 3.8 mmol/L (ref 3.5–5.1)
SODIUM: 138 mmol/L (ref 135–145)
TOTAL PROTEIN: 6.3 g/dL — AB (ref 6.5–8.1)

## 2016-01-28 LAB — CBC
HCT: 41.9 % (ref 36.0–46.0)
HEMOGLOBIN: 14.1 g/dL (ref 12.0–15.0)
MCH: 32.3 pg (ref 26.0–34.0)
MCHC: 33.7 g/dL (ref 30.0–36.0)
MCV: 95.9 fL (ref 78.0–100.0)
PLATELETS: 201 10*3/uL (ref 150–400)
RBC: 4.37 MIL/uL (ref 3.87–5.11)
RDW: 12.6 % (ref 11.5–15.5)
WBC: 11.3 10*3/uL — ABNORMAL HIGH (ref 4.0–10.5)

## 2016-01-28 LAB — DIFFERENTIAL
Basophils Absolute: 0 10*3/uL (ref 0.0–0.1)
Basophils Relative: 0 %
EOS ABS: 0.3 10*3/uL (ref 0.0–0.7)
EOS PCT: 2 %
Lymphocytes Relative: 17 %
Lymphs Abs: 2 10*3/uL (ref 0.7–4.0)
MONO ABS: 0.8 10*3/uL (ref 0.1–1.0)
Monocytes Relative: 7 %
NEUTROS PCT: 74 %
Neutro Abs: 8.4 10*3/uL — ABNORMAL HIGH (ref 1.7–7.7)

## 2016-01-28 LAB — PREGNANCY, URINE: PREG TEST UR: NEGATIVE

## 2016-01-28 LAB — LIPASE, BLOOD: Lipase: 36 U/L (ref 11–51)

## 2016-01-28 MED ORDER — OXYCODONE-ACETAMINOPHEN 5-325 MG PO TABS: 1.0000 | ORAL_TABLET | Freq: Four times a day (QID) | ORAL | Status: DC | PRN

## 2016-01-28 MED ORDER — HYDROMORPHONE HCL 1 MG/ML IJ SOLN
1.0000 mg | Freq: Once | INTRAMUSCULAR | Status: AC
  Administered 2016-01-28: 1 mg via INTRAVENOUS
  Filled 2016-01-28: qty 1

## 2016-01-28 MED ORDER — ONDANSETRON 4 MG PO TBDP: 4.0000 mg | ORAL_TABLET | Freq: Three times a day (TID) | ORAL | Status: DC | PRN

## 2016-01-28 NOTE — ED Notes (Signed)
Patient verbalizes understanding of discharge instructions, prescription medications, home care and follow up care. Patient ambulatory out of department at this time. 

## 2016-01-28 NOTE — Discharge Instructions (Signed)
Cholelithiasis °Cholelithiasis (also called gallstones) is a form of gallbladder disease in which gallstones form in your gallbladder. The gallbladder is an organ that stores bile made in the liver, which helps digest fats. Gallstones begin as small crystals and slowly grow into stones. Gallstone pain occurs when the gallbladder spasms and a gallstone is blocking the duct. Pain can also occur when a stone passes out of the duct.  °RISK FACTORS °· Being female.   °· Having multiple pregnancies. Health care providers sometimes advise removing diseased gallbladders before future pregnancies.   °· Being obese. °· Eating a diet heavy in fried foods and fat.   °· Being older than 60 years and increasing age.   °· Prolonged use of medicines containing female hormones.   °· Having diabetes mellitus.   °· Rapidly losing weight.   °· Having a family history of gallstones (heredity).   °SYMPTOMS °· Nausea.   °· Vomiting. °· Abdominal pain.   °· Yellowing of the skin (jaundice).   °· Sudden pain. It may persist from several minutes to several hours. °· Fever.   °· Tenderness to the touch.  °In some cases, when gallstones do not move into the bile duct, people have no pain or symptoms. These are called "silent" gallstones.  °TREATMENT °Silent gallstones do not need treatment. In severe cases, emergency surgery may be required. Options for treatment include: °· Surgery to remove the gallbladder. This is the most common treatment. °· Medicines. These do not always work and may take 6-12 months or more to work. °· Shock wave treatment (extracorporeal biliary lithotripsy). In this treatment an ultrasound machine sends shock waves to the gallbladder to break gallstones into smaller pieces that can pass into the intestines or be dissolved by medicine. °HOME CARE INSTRUCTIONS  °· Only take over-the-counter or prescription medicines for pain, discomfort, or fever as directed by your health care provider.   °· Follow a low-fat diet until  seen again by your health care provider. Fat causes the gallbladder to contract, which can result in pain.   °· Follow up with your health care provider as directed. Attacks are almost always recurrent and surgery is usually required for permanent treatment.   °SEEK IMMEDIATE MEDICAL CARE IF:  °· Your pain increases and is not controlled by medicines.   °· You have a fever or persistent symptoms for more than 2-3 days.   °· You have a fever and your symptoms suddenly get worse.   °· You have persistent nausea and vomiting.   °MAKE SURE YOU:  °· Understand these instructions. °· Will watch your condition. °· Will get help right away if you are not doing well or get worse. °  °This information is not intended to replace advice given to you by your health care provider. Make sure you discuss any questions you have with your health care provider. °  °Document Released: 11/21/2005 Document Revised: 07/28/2013 Document Reviewed: 05/19/2013 °Elsevier Interactive Patient Education ©2016 [employer] °Low-Fat Diet for Pancreatitis or Gallbladder Conditions °A low-fat diet can be helpful if you have pancreatitis or a gallbladder condition. With these conditions, your pancreas and gallbladder have trouble digesting fats. A healthy eating plan with less fat will help rest your pancreas and gallbladder and reduce your symptoms. °WHAT DO I NEED TO KNOW ABOUT THIS DIET? °· Eat a low-fat diet. °¨ Reduce your fat intake to less than 20-30% of your total daily calories. This is less than 50-60 g of fat per day. °¨ Remember that you need some fat in your diet. Ask your dietician what your daily goal should be. °¨ Choose   nonfat and low-fat healthy foods. Look for the words "nonfat," "low fat," or "fat free." °¨ As a guide, look on the label and choose foods with less than 3 g of fat per serving. Eat only one serving. °· Avoid alcohol. °· Do not smoke. If you need help quitting, talk with your health care provider. °· Eat small  frequent meals instead of three large heavy meals. °WHAT FOODS CAN I EAT? °Grains °Include healthy grains and starches such as potatoes, wheat bread, fiber-rich cereal, and brown rice. Choose whole grain options whenever possible. In adults, whole grains should account for 45-65% of your daily calories.  °Fruits and Vegetables °Eat plenty of fruits and vegetables. Fresh fruits and vegetables add fiber to your diet. °Meats and Other Protein Sources °Eat lean meat such as chicken and pork. Trim any fat off of meat before cooking it. Eggs, fish, and beans are other sources of protein. In adults, these foods should account for 10-35% of your daily calories. °Dairy °Choose low-fat milk and dairy options. Dairy includes fat and protein, as well as calcium.  °Fats and Oils °Limit high-fat foods such as fried foods, sweets, baked goods, sugary drinks.  °Other °Creamy sauces and condiments, such as mayonnaise, can add extra fat. Think about whether or not you need to use them, or use smaller amounts or low fat options. °WHAT FOODS ARE NOT RECOMMENDED? °· High fat foods, such as: °¨ Baked goods. °¨ Ice cream. °¨ French toast. °¨ Sweet rolls. °¨ Pizza. °¨ Cheese bread. °¨ Foods covered with batter, butter, creamy sauces, or cheese. °¨ Fried foods. °¨ Sugary drinks and desserts. °· Foods that cause gas or bloating °  °This information is not intended to replace advice given to you by your health care provider. Make sure you discuss any questions you have with your health care provider. °  °Document Released: 11/30/2013 Document Reviewed: 11/30/2013 °Elsevier Interactive Patient Education ©2016 Elsevier Inc. ° °

## 2016-01-29 ENCOUNTER — Ambulatory Visit (HOSPITAL_COMMUNITY): Payer: BLUE CROSS/BLUE SHIELD

## 2016-01-29 DIAGNOSIS — K8020 Calculus of gallbladder without cholecystitis without obstruction: Secondary | ICD-10-CM | POA: Insufficient documentation

## 2016-01-29 HISTORY — DX: Calculus of gallbladder without cholecystitis without obstruction: K80.20

## 2016-01-30 MED FILL — Oxycodone w/ Acetaminophen Tab 5-325 MG: ORAL | Qty: 6 | Status: AC

## 2016-02-14 ENCOUNTER — Ambulatory Visit (INDEPENDENT_AMBULATORY_CARE_PROVIDER_SITE_OTHER): Payer: BLUE CROSS/BLUE SHIELD | Admitting: Adult Health

## 2016-02-14 ENCOUNTER — Encounter: Payer: Self-pay | Admitting: Adult Health

## 2016-02-14 VITALS — BP 142/90 | HR 92 | Ht 61.4 in | Wt 198.0 lb

## 2016-02-14 DIAGNOSIS — Z01419 Encounter for gynecological examination (general) (routine) without abnormal findings: Secondary | ICD-10-CM | POA: Diagnosis not present

## 2016-02-14 DIAGNOSIS — A630 Anogenital (venereal) warts: Secondary | ICD-10-CM

## 2016-02-14 DIAGNOSIS — I10 Essential (primary) hypertension: Secondary | ICD-10-CM

## 2016-02-14 DIAGNOSIS — F439 Reaction to severe stress, unspecified: Secondary | ICD-10-CM

## 2016-02-14 HISTORY — DX: Reaction to severe stress, unspecified: F43.9

## 2016-02-14 MED ORDER — HYDROCHLOROTHIAZIDE 12.5 MG PO CAPS: 12.5000 mg | ORAL_CAPSULE | Freq: Every day | ORAL | Status: DC

## 2016-02-14 MED ORDER — BUPROPION HCL ER (SR) 150 MG PO TB12: 150.0000 mg | ORAL_TABLET | Freq: Every day | ORAL | Status: DC

## 2016-02-14 NOTE — Patient Instructions (Addendum)
Physical in 1 year, pap 2019 Mammogram at 38 Return for wart removal in 1 week  Then make appt for BP check in 4 weeks

## 2016-02-14 NOTE — Progress Notes (Signed)
Patient ID: Destiny Petty, female   DOB: 01-05-1981, 35 y.o.   MRN: [id number] History of Present Illness: Destiny Petty is a 35 year old white female, married in for a well woman gyn exam, she had a normal pap with negative HPV 02/10/15.She had GB surgery in February and has some discharge at navel, her husband had MVA in January and is out of work and going through immigration issues and she is stressed.Has been going to bariatric clinic and has lost 38 lbs since November.   Current Medications, Allergies, Past Medical History, Past Surgical History, Family History and Social History were reviewed in [facility] electronic medical record.     Review of Systems:  Patient denies any headaches, hearing loss, fatigue, blurred vision, shortness of breath, chest pain, abdominal pain, problems with bowel movements, urination, or intercourse. No joint pain or mood swings.See HPI for positives.   Physical Exam:BP 142/90 mmHg  Pulse 92  Ht 5' 1.4" (1.56 m)  Wt 198 lb (89.812 kg)  BMI 36.90 kg/m2  LMP 02/05/2016  BP was 140 /100 on arrival General:  Well developed, well nourished, no acute distress Skin:  Warm and dry Neck:  Midline trachea, normal thyroid, good ROM, no lymphadenopathy Lungs; Clear to auscultation bilaterally Breast:  No dominant palpable mass, retraction, or nipple discharge Cardiovascular: Regular rate and rhythm Abdomen:  Soft, non tender, no hepatosplenomegaly, has pin hole at navel and mildly red, has appt with surgeon in am Pelvic:  External genitalia is normal in appearance, no lesions.  The vagina is normal in appearance. Urethra has no lesions or masses. The cervix is bulbous.  Uterus is felt to be normal size, shape, and contour.  No adnexal masses or tenderness noted.Bladder is non tender, no masses felt. She has peri anal wart, on hemorrhoid, Dr Emelda FearFerguson in to co exam and he said he could remove, she says it hurts to wipe at times Extremities/musculoskeletal:  No swelling or  varicosities noted, no clubbing or cyanosis Psych:  No mood changes, alert and cooperative,seems happy   Impression: Well woman gyn exam no pap Hypertension Peri anal wart Stress     Plan: Rx Microzide 12.5 mg #30 take 1 daily with 11 refills Rx Wellbutrin 150 mg SR #30 take 1 daily with 3 refills Follow up in 1 week for wart removal with Dr Emelda FearFerguson Follow up with me in for weeks for BP check and talk about adipex Physical in 1 year, pap in 2019

## 2016-02-16 NOTE — ED Notes (Signed)
Called to ER wanting work notes for the dates she was in the ED in February. Notes provided to registration clerk and patient aware that she has to come pick them up.

## 2016-02-21 ENCOUNTER — Ambulatory Visit (INDEPENDENT_AMBULATORY_CARE_PROVIDER_SITE_OTHER): Payer: BLUE CROSS/BLUE SHIELD | Admitting: Obstetrics and Gynecology

## 2016-02-21 ENCOUNTER — Encounter: Payer: Self-pay | Admitting: Obstetrics and Gynecology

## 2016-02-21 ENCOUNTER — Other Ambulatory Visit: Payer: Self-pay | Admitting: Obstetrics and Gynecology

## 2016-02-21 ENCOUNTER — Encounter (HOSPITAL_COMMUNITY)
Admission: RE | Admit: 2016-02-21 | Discharge: 2016-02-21 | Disposition: A | Discharge status: Home / Self Care | Payer: BLUE CROSS/BLUE SHIELD | Source: Ambulatory Visit | Attending: Obstetrics and Gynecology | Admitting: Obstetrics and Gynecology

## 2016-02-21 VITALS — BP 122/80 | Ht 61.0 in | Wt 201.0 lb

## 2016-02-21 DIAGNOSIS — N390 Urinary tract infection, site not specified: Secondary | ICD-10-CM

## 2016-02-21 DIAGNOSIS — M545 Low back pain, unspecified: Secondary | ICD-10-CM

## 2016-02-21 LAB — POCT URINALYSIS DIPSTICK
GLUCOSE UA: NEGATIVE
Ketones, UA: NEGATIVE
Leukocytes, UA: NEGATIVE
NITRITE UA: NEGATIVE
PROTEIN UA: NEGATIVE

## 2016-02-21 LAB — CBC WITH DIFFERENTIAL/PLATELET
BASOS PCT: 0 %
Basophils Absolute: 0 10*3/uL (ref 0.0–0.1)
EOS ABS: 0.3 10*3/uL (ref 0.0–0.7)
Eosinophils Relative: 4 %
HCT: 47.3 % — ABNORMAL HIGH (ref 36.0–46.0)
HEMOGLOBIN: 16 g/dL — AB (ref 12.0–15.0)
Lymphocytes Relative: 31 %
Lymphs Abs: 2.8 10*3/uL (ref 0.7–4.0)
MCH: 32.4 pg (ref 26.0–34.0)
MCHC: 33.8 g/dL (ref 30.0–36.0)
MCV: 95.7 fL (ref 78.0–100.0)
MONOS PCT: 6 %
Monocytes Absolute: 0.6 10*3/uL (ref 0.1–1.0)
NEUTROS PCT: 59 %
Neutro Abs: 5.3 10*3/uL (ref 1.7–7.7)
Platelets: 243 10*3/uL (ref 150–400)
RBC: 4.94 MIL/uL (ref 3.87–5.11)
RDW: 12.8 % (ref 11.5–15.5)
WBC: 8.9 10*3/uL (ref 4.0–10.5)

## 2016-02-21 LAB — BASIC METABOLIC PANEL
Anion gap: 6 (ref 5–15)
BUN: 11 mg/dL (ref 6–20)
CALCIUM: 8.9 mg/dL (ref 8.9–10.3)
CHLORIDE: 106 mmol/L (ref 101–111)
CO2: 25 mmol/L (ref 22–32)
CREATININE: 0.78 mg/dL (ref 0.44–1.00)
GFR calc Af Amer: >60 mL/min (ref >60)
GFR calc non Af Amer: >60 mL/min (ref >60)
Glucose, Bld: 90 mg/dL (ref 65–99)
Potassium: 3.4 mmol/L — ABNORMAL LOW (ref 3.5–5.1)
SODIUM: 137 mmol/L (ref 135–145)

## 2016-02-21 MED ORDER — SODIUM CHLORIDE 0.9 % IV SOLN
INTRAVENOUS | Status: DC
  Administered 2016-02-21: 250 mL via INTRAVENOUS

## 2016-02-21 MED ORDER — HYDROMORPHONE HCL 1 MG/ML IJ SOLN
1.0000 mg | Freq: Once | INTRAMUSCULAR | Status: AC
Start: 2016-02-21 — End: 2016-02-21
  Administered 2016-02-21: 1 mg via INTRAVENOUS

## 2016-02-21 MED ORDER — DEXTROSE 5 % IV SOLN
INTRAVENOUS | Status: AC
  Filled 2016-02-21: qty 2

## 2016-02-21 MED ORDER — DEXTROSE 5 % IV SOLN: 2.0000 g | Freq: Once | INTRAVENOUS | Status: DC

## 2016-02-21 MED ORDER — DEXTROSE 5 % IV SOLN
2.0000 g | Freq: Once | INTRAVENOUS | Status: AC
  Administered 2016-02-21: 2 g via INTRAVENOUS

## 2016-02-21 MED ORDER — HYDROMORPHONE HCL 1 MG/ML IJ SOLN
INTRAMUSCULAR | Status: AC
  Filled 2016-02-21: qty 1

## 2016-02-21 NOTE — Progress Notes (Signed)
Patient ID: Destiny Petty, female   DOB: 06-03-1981, 35 y.o.   MRN: [id number] Patient ID: Destiny Petty, female   DOB: 1981-03-15, 35 y.o.   MRN: [id number]    [facility] Visit  Patient name: Destiny Petty MRN [id number]  Date of birth: [birth year]  CC & HPI:  Destiny Petty is a 35 y.o. female presenting today for moderate to severe, sharp, sudden onset right flank pain with radiation to the mid back onset last night with associated intermittent fever. Pt is not allergic to antibiotics. Pt has been taking Keflex for 1 week with no relief of symptoms. She reports laparoscopic cholecystectomy on 2/29/17. She denies vaginal bleeding.   ROS:  A complete 10 system review of systems was obtained and all systems are negative except as noted in the HPI and PMH.    Pertinent History Reviewed:   Reviewed: Significant for HTN, cesarean section, tubal ligation, cholecystectomy  Medical         Past Medical History  Diagnosis Date  . Asthma   . Hypertension   . Anxiety   . Depression   . Obesity   . Perianal wart 02/10/2015  . Hemorrhoids 02/10/2015  . Hiatal hernia   . Stress 02/14/2016                              Surgical Hx:    Past Surgical History  Procedure Laterality Date  . Cesarean section    . Tubal ligation    . Esophagogastroduodenoscopy endoscopy    . Colonoscopy    . Cholecystectomy     Medications: Reviewed & Updated - see associated section                       Current outpatient prescriptions:  .  buPROPion (WELLBUTRIN SR) 150 MG 12 hr tablet, Take 1 tablet (150 mg total) by mouth daily., Disp: 30 tablet, Rfl: 3 .  cephALEXin (KEFLEX) 500 MG capsule, , Disp: , Rfl: 0 .  hydrochlorothiazide (MICROZIDE) 12.5 MG capsule, Take 1 capsule (12.5 mg total) by mouth daily., Disp: 30 capsule, Rfl: 11 .  oxyCODONE-acetaminophen (PERCOCET/ROXICET) 5-325 MG tablet, Take 1-2 tablets by mouth every 6 (six) hours as needed., Disp: 20 tablet, Rfl: 0 .  [DISCONTINUED]  promethazine (PHENERGAN) 25 MG tablet, Take 1 tablet (25 mg total) by mouth every 6 (six) hours as needed for nausea or vomiting., Disp: 30 tablet, Rfl: 0   Social History: Reviewed -  reports that she has been smoking Cigarettes.  She has a 18 pack-year smoking history. She has never used smokeless tobacco.  Objective Findings:  Vitals: Blood pressure 122/80, height  (1.549 m), weight 201 lb (91.173 kg), last menstrual period 02/05/2016.  Physical Examination: General appearance - alert, anxious Abdomen - soft, nontender, nondistended, no masses or organomegaly, well healing surgical incision s/p lap choly Back exam - full range of motion, palpable spasm or pain on motion, Right CVA TTP.  Skin - normal coloration and turgor, no rashes, no suspicious skin lesions noted, warm and dry  CBC    Component Value Date/Time   WBC 8.9 02/21/2016 1600   RBC 4.94 02/21/2016 1600   HGB 16.0* 02/21/2016 1600   HCT 47.3* 02/21/2016 1600   PLT 243 02/21/2016 1600   MCV 95.7 02/21/2016 1600   MCH 32.4 02/21/2016 1600   MCHC 33.8 02/21/2016 1600   RDW 12.8 02/21/2016 1600  LYMPHSABS 2.8 02/21/2016 1600   MONOABS 0.6 02/21/2016 1600   EOSABS 0.3 02/21/2016 1600   BASOSABS 0.0 02/21/2016 1600    CMP Latest Ref Rng 02/21/2016 01/28/2016 01/21/2016  Glucose 65 - 99 mg/dL 90 93 87  BUN 6 - 20 mg/dL 11 14 16   Creatinine 0.44 - 1.00 mg/dL 1.610.78 0.960.73 0.450.88  Sodium 135 - 145 mmol/L 137 138 139  Potassium 3.5 - 5.1 mmol/L 3.4(L) 3.8 4.0  Chloride 101 - 111 mmol/L 106 108 109  CO2 22 - 32 mmol/L 25 24 23   Calcium 8.9 - 10.3 mg/dL 8.9 4.0(J8.5(L) 9.2  Total Protein 6.5 - 8.1 g/dL - 6.3(L) 7.3  Total Bilirubin 0.3 - 1.2 mg/dL - 0.3 0.5  Alkaline Phos 38 - 126 U/L - 64 67  AST 15 - 41 U/L - 15 18  ALT 14 - 54 U/L - 13(L) 13(L)     Assessment & Plan:   A:  1. Sharp right flank pain onset last night 2. UA +trace hematuria - despite being on keflex x1 week  3. Right CVA tenderness on exam  4. ? Right  pyelonephritis   P:  1. Urine culture  2. Reschedule wart removal  3. 2g IV Rocephin, CBC, CMET, [facility]      By signing my name below, I, Doreatha MartinEva Mathews, attest that this documentation has been prepared under the direction and in the presence of Tilda BurrowJohn Rasheed Welty V, MD. Electronically Signed: Doreatha MartinEva Mathews, ED Scribe. 02/21/2016. 3:03 PM.  I personally performed the services described in this documentation, which was SCRIBED in my presence. The recorded information has been reviewed and considered accurate. It has been edited as necessary during review. Tilda BurrowFERGUSON,Leatrice Parilla V, MD

## 2016-02-21 NOTE — Progress Notes (Signed)
Awake. Rates pain 10. C/O right flank pain. Dilaudid 1 mg IV given.

## 2016-02-21 NOTE — Progress Notes (Signed)
Patient ID: Destiny Petty, female   DOB: 1981/12/09, 35 y.o.   MRN: [id number] Pt here today for wart removal. Pt states that she is having terrible right side pain that radiates to her back. Urine dipped and showed just a trace of blood.

## 2016-02-21 NOTE — Progress Notes (Signed)
Arrived for labs and antibiotic infusion. Crying. C/O right flank pain. Rates pain 10. Husband at side.

## 2016-02-21 NOTE — Progress Notes (Signed)
CBC and B-met drawn and sent to lab for results.

## 2016-02-21 NOTE — Progress Notes (Signed)
Dr Emelda FearFerguson notified of pt having pain. Order given for dilaudid.

## 2016-02-21 NOTE — Progress Notes (Signed)
Resting quietly. No further crying. Rates pain 8.

## 2016-02-21 NOTE — Progress Notes (Signed)
Patient ID: Destiny Petty, female   DOB: 1981-10-26, [age] y.o.   MRN: [id number]    [facility]  Patient name: Destiny Petty MRN [id number]  Date of birth: 1981-10-26  CC & HPI:  Destiny Petty is a 35 y.o. female presenting today for moderate to severe, sharp, sudden onset right flank pain with radiation to the mid back onset last night with associated intermittent fever. Pt is not allergic to antibiotics. Pt has been taking Keflex for 1 week with no relief of symptoms. She reports laparoscopic cholecystectomy on 2/29/17. She denies vaginal bleeding.   ROS:  A complete 10 system review of systems was obtained and all systems are negative except as noted in the HPI and PMH.    Pertinent History Reviewed:   Reviewed: Significant for HTN, cesarean section, tubal ligation, cholecystectomy  Medical         Past Medical History  Diagnosis Date   Asthma    Hypertension    Anxiety    Depression    Obesity    Perianal wart 02/10/2015   Hemorrhoids 02/10/2015   Hiatal hernia    Stress 02/14/2016                              Surgical Hx:    Past Surgical History  Procedure Laterality Date   Cesarean section     Tubal ligation     Esophagogastroduodenoscopy endoscopy     Colonoscopy     Cholecystectomy     Medications: Reviewed & Updated - see associated section                       Current outpatient prescriptions:    buPROPion (WELLBUTRIN SR) 150 MG 12 hr tablet, Take 1 tablet (150 mg total) by mouth daily., Disp: 30 tablet, Rfl: 3   cephALEXin (KEFLEX) 500 MG capsule, , Disp: , Rfl: 0   hydrochlorothiazide (MICROZIDE) 12.5 MG capsule, Take 1 capsule (12.5 mg total) by mouth daily., Disp: 30 capsule, Rfl: 11   oxyCODONE-acetaminophen (PERCOCET/ROXICET) 5-325 MG tablet, Take 1-2 tablets by mouth every 6 (six) hours as needed., Disp: 20 tablet, Rfl: 0   [DISCONTINUED] promethazine (PHENERGAN) 25 MG tablet, Take 1 tablet (25 mg total) by mouth every 6 (six)  hours as needed for nausea or vomiting., Disp: 30 tablet, Rfl: 0   Social History: Reviewed -  reports that she has been smoking Cigarettes.  She has a 18 pack-year smoking history. She has never used smokeless tobacco.  Objective Findings:  Vitals: Blood pressure 122/80, height 5\' 1"  (1.549 m), weight 201 lb (91.173 kg), last menstrual period 02/05/2016.  Physical Examination: General appearance - alert, anxious Abdomen - soft, nontender, nondistended, no masses or organomegaly, well healing surgical incision s/p lap choly Back exam - full range of motion, palpable spasm or pain on motion, Right CVA TTP.  Skin - normal coloration and turgor, no rashes, no suspicious skin lesions noted, warm and dry  CBC    Component Value Date/Time   WBC 8.9 02/21/2016 1600   RBC 4.94 02/21/2016 1600   HGB 16.0* 02/21/2016 1600   HCT 47.3* 02/21/2016 1600   PLT 243 02/21/2016 1600   MCV 95.7 02/21/2016 1600   MCH 32.4 02/21/2016 1600   MCHC 33.8 02/21/2016 1600   RDW 12.8 02/21/2016 1600   LYMPHSABS 2.8 02/21/2016 1600   MONOABS 0.6 02/21/2016 1600   EOSABS 0.3  02/21/2016 1600   BASOSABS 0.0 02/21/2016 1600    CMP Latest Ref Rng 02/21/2016 01/28/2016 01/21/2016  Glucose 65 - 99 mg/dL 90 93 87  BUN 6 - 20 mg/dL Creatinine 0.44 - 1.00 mg/dL 1.61 0.96 0.45  Sodium 135 - 145 mmol/L 137 138 139  Potassium 3.5 - 5.1 mmol/L 3.4(L) 3.8 4.0  Chloride 101 - 111 mmol/L 106 108 109  CO2 22 - 32 mmol/L Calcium 8.9 - 10.3 mg/dL 8.9 4.0(J) 9.2  Total Protein 6.5 - 8.1 g/dL - 6.3(L) 7.3  Total Bilirubin 0.3 - 1.2 mg/dL - 0.3 0.5  Alkaline Phos 38 - 126 U/L - 64 67  AST 15 - 41 U/L - 15 18  ALT 14 - 54 U/L - 13(L) 13(L)     Assessment & Plan:   A:  1. Sharp right flank pain onset last night 2. UA +trace hematuria - despite being on keflex x1 week  3. Right CVA tenderness on exam  4. ? Right pyelonephritis   P:  1. Urine culture  2. Reschedule wart removal  3. 2g IV Rocephin,  CBC, CMET, observation in Short Stay at [facility]      By signing my name below, I, Doreatha Martin, attest that this documentation has been prepared under the direction and in the presence of Tilda Burrow, MD. Electronically Signed: Doreatha Martin, ED Scribe. 02/21/2016. 3:03 PM.  I personally performed the services described in this documentation, which was SCRIBED in my presence. The recorded information has been reviewed and considered accurate. It has been edited as necessary during review. Tilda Burrow, MD

## 2016-02-22 ENCOUNTER — Emergency Department (HOSPITAL_COMMUNITY): Payer: BLUE CROSS/BLUE SHIELD

## 2016-02-22 ENCOUNTER — Telehealth: Payer: Self-pay | Admitting: Obstetrics and Gynecology

## 2016-02-22 ENCOUNTER — Encounter (HOSPITAL_COMMUNITY): Payer: Self-pay | Admitting: Emergency Medicine

## 2016-02-22 ENCOUNTER — Emergency Department (HOSPITAL_COMMUNITY)
Admission: EM | Admit: 2016-02-22 | Discharge: 2016-02-22 | Disposition: A | Discharge status: Home / Self Care | Payer: BLUE CROSS/BLUE SHIELD | Attending: Emergency Medicine | Admitting: Emergency Medicine

## 2016-02-22 DIAGNOSIS — Z9049 Acquired absence of other specified parts of digestive tract: Secondary | ICD-10-CM | POA: Diagnosis not present

## 2016-02-22 DIAGNOSIS — J45909 Unspecified asthma, uncomplicated: Secondary | ICD-10-CM | POA: Insufficient documentation

## 2016-02-22 DIAGNOSIS — I10 Essential (primary) hypertension: Secondary | ICD-10-CM | POA: Diagnosis not present

## 2016-02-22 DIAGNOSIS — M549 Dorsalgia, unspecified: Secondary | ICD-10-CM | POA: Insufficient documentation

## 2016-02-22 DIAGNOSIS — F329 Major depressive disorder, single episode, unspecified: Secondary | ICD-10-CM | POA: Insufficient documentation

## 2016-02-22 DIAGNOSIS — R109 Unspecified abdominal pain: Secondary | ICD-10-CM | POA: Diagnosis present

## 2016-02-22 DIAGNOSIS — F17210 Nicotine dependence, cigarettes, uncomplicated: Secondary | ICD-10-CM | POA: Diagnosis not present

## 2016-02-22 DIAGNOSIS — Z9851 Tubal ligation status: Secondary | ICD-10-CM | POA: Insufficient documentation

## 2016-02-22 DIAGNOSIS — E669 Obesity, unspecified: Secondary | ICD-10-CM | POA: Diagnosis not present

## 2016-02-22 LAB — URINALYSIS, ROUTINE W REFLEX MICROSCOPIC
BILIRUBIN URINE: NEGATIVE
GLUCOSE, UA: NEGATIVE mg/dL
Ketones, ur: NEGATIVE mg/dL
Leukocytes, UA: NEGATIVE
Nitrite: NEGATIVE
Protein, ur: NEGATIVE mg/dL
SPECIFIC GRAVITY, URINE: 1.015 (ref 1.005–1.030)
pH: 5 (ref 5.0–8.0)

## 2016-02-22 LAB — URINE MICROSCOPIC-ADD ON

## 2016-02-22 LAB — CBC WITH DIFFERENTIAL/PLATELET
Basophils Absolute: 0 10*3/uL (ref 0.0–0.1)
Basophils Relative: 0 %
EOS ABS: 0.4 10*3/uL (ref 0.0–0.7)
EOS PCT: 5 %
HCT: 46.7 % — ABNORMAL HIGH (ref 36.0–46.0)
Hemoglobin: 15.5 g/dL — ABNORMAL HIGH (ref 12.0–15.0)
LYMPHS ABS: 3.2 10*3/uL (ref 0.7–4.0)
LYMPHS PCT: 37 %
MCH: 32 pg (ref 26.0–34.0)
MCHC: 33.2 g/dL (ref 30.0–36.0)
MCV: 96.5 fL (ref 78.0–100.0)
MONO ABS: 0.6 10*3/uL (ref 0.1–1.0)
Monocytes Relative: 7 %
Neutro Abs: 4.4 10*3/uL (ref 1.7–7.7)
Neutrophils Relative %: 51 %
PLATELETS: 246 10*3/uL (ref 150–400)
RBC: 4.84 MIL/uL (ref 3.87–5.11)
RDW: 12.8 % (ref 11.5–15.5)
WBC: 8.6 10*3/uL (ref 4.0–10.5)

## 2016-02-22 LAB — COMPREHENSIVE METABOLIC PANEL
ALT: 16 U/L (ref 14–54)
ANION GAP: 6 (ref 5–15)
AST: 18 U/L (ref 15–41)
Albumin: 4.6 g/dL (ref 3.5–5.0)
Alkaline Phosphatase: 84 U/L (ref 38–126)
BUN: 15 mg/dL (ref 6–20)
CO2: 29 mmol/L (ref 22–32)
Calcium: 9.2 mg/dL (ref 8.9–10.3)
Chloride: 105 mmol/L (ref 101–111)
Creatinine, Ser: 0.83 mg/dL (ref 0.44–1.00)
Glucose, Bld: 78 mg/dL (ref 65–99)
POTASSIUM: 3.8 mmol/L (ref 3.5–5.1)
SODIUM: 140 mmol/L (ref 135–145)
TOTAL PROTEIN: 7.6 g/dL (ref 6.5–8.1)
Total Bilirubin: 0.4 mg/dL (ref 0.3–1.2)

## 2016-02-22 LAB — I-STAT BETA HCG BLOOD, ED (MC, WL, AP ONLY)

## 2016-02-22 LAB — LIPASE, BLOOD: LIPASE: 21 U/L (ref 11–51)

## 2016-02-22 MED ORDER — HYDROMORPHONE HCL 1 MG/ML IJ SOLN
1.0000 mg | Freq: Once | INTRAMUSCULAR | Status: AC
  Administered 2016-02-22: 1 mg via INTRAVENOUS
  Filled 2016-02-22: qty 1

## 2016-02-22 MED ORDER — SODIUM CHLORIDE 0.9 % IV BOLUS (SEPSIS)
1000.0000 mL | Freq: Once | INTRAVENOUS | Status: AC
  Administered 2016-02-22: 1000 mL via INTRAVENOUS

## 2016-02-22 MED ORDER — NAPROXEN 500 MG PO TABS: 500.0000 mg | ORAL_TABLET | Freq: Two times a day (BID) | ORAL | Status: DC

## 2016-02-22 MED ORDER — CYCLOBENZAPRINE HCL 10 MG PO TABS: 10.0000 mg | ORAL_TABLET | Freq: Two times a day (BID) | ORAL | Status: DC | PRN

## 2016-02-22 MED ORDER — ONDANSETRON HCL 4 MG/2ML IJ SOLN
4.0000 mg | Freq: Once | INTRAMUSCULAR | Status: AC
  Administered 2016-02-22: 4 mg via INTRAVENOUS
  Filled 2016-02-22: qty 2

## 2016-02-22 MED ORDER — OXYCODONE-ACETAMINOPHEN 5-325 MG PO TABS: 1.0000 | ORAL_TABLET | Freq: Four times a day (QID) | ORAL | Status: DC | PRN

## 2016-02-22 MED ORDER — SODIUM CHLORIDE 0.9 % IV SOLN: INTRAVENOUS | Status: DC

## 2016-02-22 NOTE — ED Notes (Signed)
Pt states she had her gallbladder removed 2/21 and started having right flank pain a few days ago.  Was given Rocephin 2g IV yesterday and is currently on Keflex for possible early pyelonephritis.

## 2016-02-22 NOTE — Discharge Instructions (Signed)
Workup for the flank pain shows no evidence of any intra-abdominal abnormality. No kidney stone no urinary tract infection. Labs without any significant findings. May be musculoskeletal in nature. Take the Percocet as needed take the Naprosyn on a regular basis. Take Flexeril as a muscle relaxer. Make appointment to follow-up with your doctors. Work note provided.

## 2016-02-22 NOTE — Telephone Encounter (Signed)
Pt spoke directly to Dr. Emelda FearFerguson.

## 2016-02-22 NOTE — Telephone Encounter (Signed)
Patient calls office 24 hours after having received 2 g  Rocephin for suspected early pyelonephritis, due to right cvat, and trace hematuria ,no leukocytes or nitrites, in pt who had been begun on Keflex for uti several days ago. Pt is recently s/p [facility]  [facility] after several ED visits here locally and [facility]  Pt now complains of progressive right flank pain, no fever. Pain sufficiently severe, pt will be advised to be seen in ED. ED MD Dr Deretha EmoryZackowski given brief report. Pt to report to ED for eval.

## 2016-02-22 NOTE — ED Provider Notes (Signed)
CSN: [id number]     Arrival date & time 02/22/16  1537 History   First MD Initiated Contact with Patient 02/22/16 1626     Chief Complaint  Patient presents with  . Flank Pain     (Consider location/radiation/quality/duration/timing/severity/associated sxs/prior Treatment) Patient is a 35 y.o. female presenting with flank pain. The history is provided by the patient.  Flank Pain Associated symptoms include abdominal pain. Pertinent negatives include no chest pain and no shortness of breath.   patient with onset 3 days ago of right flank pain right CVA pain and now little bit of lower abdominal pain. No nausea no vomiting. [facility] concern was for pyelonephritis given 2 g of Rocephin did not get any better so referred in here today for evaluation. Patient taking Percocet for the pain without any improvement. Pain is 10 out of 10 right flank right CVA. Made worse by movements.  Past Medical History  Diagnosis Date  . Asthma   . Hypertension   . Anxiety   . Depression   . Obesity   . Perianal wart 02/10/2015  . Hemorrhoids 02/10/2015  . Hiatal hernia   . Stress 02/14/2016   Past Surgical History  Procedure Laterality Date  . Cesarean section    . Tubal ligation    . Esophagogastroduodenoscopy endoscopy    . Colonoscopy    . Cholecystectomy     Family History  Problem Relation Age of Onset  . Diabetes Mother   . Kidney failure Mother   . Depression Sister   . Hypertension Sister   . Anxiety disorder Sister   . Other Sister     back problems  . ADD / ADHD Daughter   . Other Daughter     behavioral issues  . ODD Daughter   . Asthma Son   . Cancer Maternal Grandmother   . Seizures Maternal Grandmother    Social History  Substance Use Topics  . Smoking status: Current Every Day Smoker -- 1.00 packs/day for 18 years    Types: Cigarettes  . Smokeless tobacco: Never Used  . Alcohol Use: Yes     Comment: occ   OB History    Gravida Para Term  Preterm AB TAB SAB Ectopic Multiple Living   Review of Systems  Constitutional: Negative for fever.  HENT: Negative for congestion.   Respiratory: Negative for shortness of breath.   Cardiovascular: Negative for chest pain.  Gastrointestinal: Positive for abdominal pain. Negative for nausea and vomiting.  Genitourinary: Positive for flank pain. Negative for dysuria and hematuria.  Musculoskeletal: Positive for back pain.  Skin: Negative for rash.  Neurological: Negative for seizures.  Hematological: Does not bruise/bleed easily.  Psychiatric/Behavioral: Negative for confusion.      Allergies  Latex and Lisinopril  Home Medications   Prior to Admission medications   Medication Sig Start Date End Date Taking? Authorizing Provider  buPROPion (WELLBUTRIN SR) 150 MG 12 hr tablet Take 1 tablet (150 mg total) by mouth daily. 02/14/16  Yes [name], NP  cephALEXin (KEFLEX) 500 MG capsule Take 500 mg by mouth 3 (three) times daily.  02/14/16  Yes Historical Provider, MD  hydrochlorothiazide (MICROZIDE) 12.5 MG capsule Take 1 capsule (12.5 mg total) by mouth daily. 02/14/16  Yes [name], NP  oxyCODONE-acetaminophen (PERCOCET/ROXICET) 5-325 MG tablet Take 1-2 tablets by mouth every 6 (six) hours as needed. Patient taking  differently: Take 1-2 tablets by mouth every 6 (six) hours as needed for moderate pain.  01/28/16  Yes Kristen N Ward, DO  cyclobenzaprine (FLEXERIL) 10 MG tablet Take 1 tablet (10 mg total) by mouth 2 (two) times daily as needed for muscle spasms. 02/22/16   Vanetta MuldersScott Avnoor Koury, MD  naproxen (NAPROSYN) 500 MG tablet Take 1 tablet (500 mg total) by mouth 2 (two) times daily. 02/22/16   Vanetta MuldersScott Emlyn Maves, MD  oxyCODONE-acetaminophen (PERCOCET/ROXICET) 5-325 MG tablet Take 1-2 tablets by mouth every 6 (six) hours as needed for severe pain. 02/22/16   Vanetta MuldersScott Ruchi Stoney, MD   BP 114/63 mmHg  Pulse 62  Temp(Src) 97.7 F (36.5 C) (Oral)  Resp 22  Ht 5\' 1"   (1.549 m)  Wt 90.266 kg  BMI 37.62 kg/m2  SpO2 93%  LMP 02/05/2016 Physical Exam  Constitutional: She is oriented to person, place, and time. She appears well-developed and well-nourished. No distress.  HENT:  Head: Normocephalic and atraumatic.  Mouth/Throat: Oropharynx is clear and moist.  Eyes: Conjunctivae and EOM are normal. Pupils are equal, round, and reactive to light.  Cardiovascular: Normal rate, regular rhythm and normal heart sounds.   Pulmonary/Chest: Effort normal. No respiratory distress.  Abdominal: She exhibits no distension. There is no tenderness.  Musculoskeletal: Normal range of motion. She exhibits no edema.  Neurological: She is alert and oriented to person, place, and time. No cranial nerve deficit. She exhibits normal muscle tone. Coordination normal.  Skin: Skin is warm. No rash noted.  Nursing note and vitals reviewed.   ED Course  Procedures (including critical care time) Labs Review Labs Reviewed  URINALYSIS, ROUTINE W REFLEX MICROSCOPIC (NOT AT [facility]) - Abnormal; Notable for the following:    Hgb urine dipstick TRACE (*)    All other components within normal limits  CBC WITH DIFFERENTIAL/PLATELET - Abnormal; Notable for the following:    Hemoglobin 15.5 (*)    HCT 46.7 (*)    All other components within normal limits  URINE MICROSCOPIC-ADD ON - Abnormal; Notable for the following:    Squamous Epithelial / LPF 6-30 (*)    Bacteria, UA FEW (*)    All other components within normal limits  COMPREHENSIVE METABOLIC PANEL  LIPASE, BLOOD  I-STAT BETA HCG BLOOD, ED ([facility], [facility], [facility] ONLY)   Results for orders placed or performed during the hospital encounter of 02/22/16  Urinalysis, Routine w reflex microscopic (not at [facility])  Result Value Ref Range   Color, Urine YELLOW YELLOW   APPearance CLEAR CLEAR   Specific Gravity, Urine 1.015 1.005 - 1.030   pH 5.0 5.0 - 8.0   Glucose, UA NEGATIVE NEGATIVE mg/dL   Hgb urine dipstick TRACE (A) NEGATIVE   Bilirubin  Urine NEGATIVE NEGATIVE   Ketones, ur NEGATIVE NEGATIVE mg/dL   Protein, ur NEGATIVE NEGATIVE mg/dL   Nitrite NEGATIVE NEGATIVE   Leukocytes, UA NEGATIVE NEGATIVE  CBC with Differential/Platelet  Result Value Ref Range   WBC 8.6 4.0 - 10.5 K/uL   RBC 4.84 3.87 - 5.11 MIL/uL   Hemoglobin 15.5 (H) 12.0 - 15.0 g/dL   HCT 16.146.7 (H) 09.636.0 - 04.546.0 %   MCV 96.5 78.0 - 100.0 fL   MCH 32.0 26.0 - 34.0 pg   MCHC 33.2 30.0 - 36.0 g/dL   RDW 40.912.8 81.111.5 - 91.415.5 %   Platelets 246 150 - 400 K/uL   Neutrophils Relative % 51 %   Neutro Abs 4.4 1.7 - 7.7 K/uL   Lymphocytes Relative 37 %  Lymphs Abs 3.2 0.7 - 4.0 K/uL   Monocytes Relative 7 %   Monocytes Absolute 0.6 0.1 - 1.0 K/uL   Eosinophils Relative 5 %   Eosinophils Absolute 0.4 0.0 - 0.7 K/uL   Basophils Relative 0 %   Basophils Absolute 0.0 0.0 - 0.1 K/uL  Comprehensive metabolic panel  Result Value Ref Range   Sodium 140 135 - 145 mmol/L   Potassium 3.8 3.5 - 5.1 mmol/L   Chloride 105 101 - 111 mmol/L   CO2 29 22 - 32 mmol/L   Glucose, Bld 78 65 - 99 mg/dL   BUN 15 6 - 20 mg/dL   Creatinine, Ser 1.61 0.44 - 1.00 mg/dL   Calcium 9.2 8.9 - 09.6 mg/dL   Total Protein 7.6 6.5 - 8.1 g/dL   Albumin 4.6 3.5 - 5.0 g/dL   AST 18 15 - 41 U/L   ALT 16 14 - 54 U/L   Alkaline Phosphatase 84 38 - 126 U/L   Total Bilirubin 0.4 0.3 - 1.2 mg/dL   GFR calc non Af Amer >60 >60 mL/min   GFR calc Af Amer >60 >60 mL/min   Anion gap 6 5 - 15  Lipase, blood  Result Value Ref Range   Lipase 21 11 - 51 U/L  Urine microscopic-add on  Result Value Ref Range   Squamous Epithelial / LPF 6-30 (A) NONE SEEN   WBC, UA 0-5 0 - 5 WBC/hpf   RBC / HPF 0-5 0 - 5 RBC/hpf   Bacteria, UA FEW (A) NONE SEEN  I-Stat Beta hCG blood, ED ([facility], [facility], [facility] only)  Result Value Ref Range   I-stat hCG, quantitative <5.0 <5 mIU/mL   Comment 3             Imaging Review Ct Renal Stone Study  02/22/2016  CLINICAL DATA:  Right flank pain for 3 days. Patient had cholecystectomy  on January 29, 2016. EXAM: CT ABDOMEN AND PELVIS WITHOUT CONTRAST TECHNIQUE: Multidetector CT imaging of the abdomen and pelvis was performed following the standard protocol without IV contrast. COMPARISON:  None. FINDINGS: Lower chest:  No acute findings. Hepatobiliary: The liver is normal. There is no focal liver lesion. The patient is status post prior cholecystectomy. Pancreas: No mass or inflammatory process identified on this un-enhanced exam. Spleen: Within normal limits in size. Adrenals/Urinary Tract: No evidence of urolithiasis or hydronephrosis. No definite mass visualized on this un-enhanced exam. Stomach/Bowel: No evidence of obstruction, inflammatory process, or abnormal fluid collections. The appendix is normal. Vascular/Lymphatic: No pathologically enlarged lymph nodes. No evidence of abdominal aortic aneurysm. Reproductive: No mass or other significant abnormality. Other: There is small umbilical herniation of mesenteric fat. Musculoskeletal:  Degenerative joint changes of the spine are noted. IMPRESSION: No nephrolithiasis or hydroureteronephrosis bilaterally. Normal appendix. No acute abnormality identified in the abdomen and pelvis. Electronically Signed   By: Sherian Rein M.D.   On: 02/22/2016 18:25   I have personally reviewed and evaluated these images and lab results as part of my medical decision-making.   EKG Interpretation None      MDM   Final diagnoses:  Right flank pain    Since seen by Dr. Emel[facility] yesterday felt to have pyelonephritis due to the right flank pain was sent in to short stay and was given 2 g of Rocephin. Patient without any improvement so is referred her in to the emergency department today. Patient also status post gallbladder removal February 21.  Workup here today CT  scan showing no evidence of kidney stone normal appendix no intra-abdominal process. Urinalysis is normal no leukocytosis basic labs including renal function all normal.  Clinically seems as if symptoms could be musculoskeletal in nature will treat as such. No evidence of pyelonephritis no evidence urinary tract infection brain CT is negative. No evidence of appendicitis.      Vanetta Mulders, MD 02/22/16 1924

## 2016-02-23 ENCOUNTER — Ambulatory Visit: Payer: BLUE CROSS/BLUE SHIELD | Admitting: Obstetrics and Gynecology

## 2016-02-23 ENCOUNTER — Telehealth: Payer: Self-pay | Admitting: *Deleted

## 2016-02-23 NOTE — Telephone Encounter (Signed)
No answer at number of record.

## 2016-03-13 ENCOUNTER — Ambulatory Visit (INDEPENDENT_AMBULATORY_CARE_PROVIDER_SITE_OTHER): Payer: BLUE CROSS/BLUE SHIELD | Admitting: Adult Health

## 2016-03-13 ENCOUNTER — Encounter: Payer: Self-pay | Admitting: Adult Health

## 2016-03-13 VITALS — BP 140/84 | HR 74 | Ht 61.0 in | Wt 200.0 lb

## 2016-03-13 DIAGNOSIS — I10 Essential (primary) hypertension: Secondary | ICD-10-CM | POA: Diagnosis not present

## 2016-03-13 DIAGNOSIS — A630 Anogenital (venereal) warts: Secondary | ICD-10-CM | POA: Diagnosis not present

## 2016-03-13 DIAGNOSIS — E6609 Other obesity due to excess calories: Secondary | ICD-10-CM | POA: Insufficient documentation

## 2016-03-13 DIAGNOSIS — M62830 Muscle spasm of back: Secondary | ICD-10-CM | POA: Diagnosis not present

## 2016-03-13 DIAGNOSIS — Z6837 Body mass index (BMI) 37.0-37.9, adult: Secondary | ICD-10-CM | POA: Diagnosis not present

## 2016-03-13 DIAGNOSIS — E669 Obesity, unspecified: Secondary | ICD-10-CM | POA: Insufficient documentation

## 2016-03-13 DIAGNOSIS — Z713 Dietary counseling and surveillance: Secondary | ICD-10-CM

## 2016-03-13 HISTORY — DX: Dietary counseling and surveillance: Z71.3

## 2016-03-13 HISTORY — DX: Muscle spasm of back: M62.830

## 2016-03-13 HISTORY — DX: Body mass index (BMI) 37.0-37.9, adult: Z68.37

## 2016-03-13 MED ORDER — PHENTERMINE HCL 37.5 MG PO CAPS: 37.5000 mg | ORAL_CAPSULE | ORAL | Status: DC

## 2016-03-13 MED ORDER — CYCLOBENZAPRINE HCL 10 MG PO TABS: 10.0000 mg | ORAL_TABLET | Freq: Three times a day (TID) | ORAL | Status: DC | PRN

## 2016-03-13 NOTE — Progress Notes (Signed)
Subjective:     Patient ID: Destiny Petty, female   DOB: June 25, 1981, 35 y.o.   MRN: [id number]  HPI [name] is a 35 year old white female in for BP check, started on microzide, and complains of back pain, has had in past but is worse with this period and feels knot in lower back.She was supposed to have wart removed with Dr Emelda FearFerguson but he did not do it last visit, she was having flank pain then with ?kidney infection. She wants to try adipex to get started on weight loss.   Review of Systems Patient denies any headaches, hearing loss, fatigue, blurred vision, shortness of breath, chest pain, abdominal pain, problems with bowel movements, urination, or intercourse. No joint pain or mood swings.See HPI for positives.  Reviewed past medical,surgical, social and family history. Reviewed medications and allergies.     Objective:   Physical Exam BP 140/84 mmHg  Pulse 74  Ht 5\' 1"  (1.549 m)  Wt 200 lb (90.719 kg)  BMI 37.81 kg/m2  LMP 03/11/2016 Skin warm and dry.  Lungs: clear to ausculation bilaterally. Cardiovascular: regular rate and rhythm.Has tenderness and spasm in lower back.Encouraged to use flexeril and ice, and will Rx adipex, but will need to cut portions, carbs and increase water and when feels better, walk. Face time 15 minutes with 50% counseling.     Assessment:    Hypertension  Muscle spasms of back  BMI 37.81 Weight loss counseling Obesity Perianal warts     Plan:    Return in 1 week for wart removal with Dr Emelda FearFerguson Refilled flexeril 10 mg #30 take 1 tid prn with 1 refill Use ice 10-20 minutes 2-3 x daily Rx adipex 37.5 mg #30 take 1 in am daily no refills Follow up in 4 weeks for weight and BP check Increase water and decrease carbs   Continue microzide

## 2016-03-13 NOTE — Patient Instructions (Signed)
Muscle Cramps and Spasms Muscle cramps and spasms occur when a muscle or muscles tighten and you have no control over this tightening (involuntary muscle contraction). They are a common problem and can develop in any muscle. The most common place is in the calf muscles of the leg. Both muscle cramps and muscle spasms are involuntary muscle contractions, but they also have differences:   Muscle cramps are sporadic and painful. They may last a few seconds to a quarter of an hour. Muscle cramps are often more forceful and last longer than muscle spasms.  Muscle spasms may or may not be painful. They may also last just a few seconds or much longer. CAUSES  It is uncommon for cramps or spasms to be due to a serious underlying problem. In many cases, the cause of cramps or spasms is unknown. Some common causes are:   Overexertion.   Overuse from repetitive motions (doing the same thing over and over).   Remaining in a certain position for a long period of time.   Improper preparation, form, or technique while performing a sport or activity.   Dehydration.   Injury.   Side effects of some medicines.   Abnormally low levels of the salts and ions in your blood (electrolytes), especially potassium and calcium. This could happen if you are taking water pills (diuretics) or you are pregnant.  Some underlying medical problems can make it more likely to develop cramps or spasms. These include, but are not limited to:   Diabetes.   Parkinson disease.   Hormone disorders, such as thyroid problems.   Alcohol abuse.   Diseases specific to muscles, joints, and bones.   Blood vessel disease where not enough blood is getting to the muscles.  HOME CARE INSTRUCTIONS   Stay well hydrated. Drink enough water and fluids to keep your urine clear or pale yellow.  It may be helpful to massage, stretch, and relax the affected muscle.  For tight or tense muscles, use a warm towel, heating  pad, or hot shower water directed to the affected area.  If you are sore or have pain after a cramp or spasm, applying ice to the affected area may relieve discomfort.  Put ice in a plastic bag.  Place a towel between your skin and the bag.  Leave the ice on for 15-20 minutes, 03-04 times a day.  Medicines used to treat a known cause of cramps or spasms may help reduce their frequency or severity. Only take over-the-counter or prescription medicines as directed by your caregiver. SEEK MEDICAL CARE IF:  Your cramps or spasms get more severe, more frequent, or do not improve over time.  MAKE SURE YOU:   Understand these instructions.  Will watch your condition.  Will get help right away if you are not doing well or get worse.   This information is not intended to replace advice given to you by your health care provider. Make sure you discuss any questions you have with your health care provider.   Document Released: 05/17/2002 Document Revised: 03/22/2013 Document Reviewed: 11/11/2012 Elsevier Interactive Patient Education 2016 [employer] Use ice 2-3 x daily for 10-20 minutes Use flexeril Return in 1 week for wart removal 4 weeks for weight and BP check Decrease carbs and increase water

## 2016-03-19 ENCOUNTER — Encounter: Payer: Self-pay | Admitting: Obstetrics and Gynecology

## 2016-03-19 ENCOUNTER — Other Ambulatory Visit: Payer: Self-pay | Admitting: Obstetrics and Gynecology

## 2016-03-19 ENCOUNTER — Ambulatory Visit (INDEPENDENT_AMBULATORY_CARE_PROVIDER_SITE_OTHER): Payer: BLUE CROSS/BLUE SHIELD | Admitting: Obstetrics and Gynecology

## 2016-03-19 VITALS — BP 140/90 | HR 66 | Ht 61.0 in | Wt 200.0 lb

## 2016-03-19 DIAGNOSIS — A630 Anogenital (venereal) warts: Secondary | ICD-10-CM | POA: Diagnosis not present

## 2016-03-19 HISTORY — DX: Anogenital (venereal) warts: A63.0

## 2016-03-19 MED ORDER — HYDROCODONE-ACETAMINOPHEN 5-325 MG PO TABS: 1.0000 | ORAL_TABLET | Freq: Four times a day (QID) | ORAL | Status: DC | PRN

## 2016-03-19 MED ORDER — ZOLPIDEM TARTRATE 10 MG PO TABS: 5.0000 mg | ORAL_TABLET | Freq: Every evening | ORAL | Status: DC | PRN

## 2016-03-19 NOTE — Addendum Note (Signed)
Addended by: Colen DarlingYOUNG, Denvil Canning S on: 03/19/2016 05:45 PM   Modules accepted: Orders

## 2016-03-19 NOTE — Progress Notes (Signed)
Patient ID: Destiny Petty, female   DOB: 06/25/1981, 35 y.o.   MRN: [id number]  Chief Complaint  Patient presents with  . wart removal    Consent signed    HPI Destiny Petty is a 35 y.o. female.  Large wart on anterior anal skin tag 12:00 , infiltrated and removed  HPI Indication: white lesion of the vulva Symptoms:   pruritic and not painful, occasional cracking and bleeding and none  Location:  anus  Past Medical History  Diagnosis Date  . Asthma   . Hypertension   . Anxiety   . Depression   . Obesity   . Perianal wart 02/10/2015  . Hemorrhoids 02/10/2015  . Hiatal hernia   . Stress 02/14/2016  . Muscle spasm of back 03/13/2016  . Body mass index 37.0-37.9, adult 03/13/2016  . Weight loss counseling, encounter for 03/13/2016    Past Surgical History  Procedure Laterality Date  . Cesarean section    . Tubal ligation    . Esophagogastroduodenoscopy endoscopy    . Colonoscopy    . Cholecystectomy      Family History  Problem Relation Age of Onset  . Diabetes Mother   . Kidney failure Mother   . Depression Sister   . Hypertension Sister   . Anxiety disorder Sister   . Other Sister     back problems  . ADD / ADHD Daughter   . Other Daughter     behavioral issues  . ODD Daughter   . Asthma Son   . Cancer Maternal Grandmother   . Seizures Maternal Grandmother     Social History Social History  Substance Use Topics  . Smoking status: Current Every Day Smoker -- 1.00 packs/day for 18 years    Types: Cigarettes  . Smokeless tobacco: Never Used  . Alcohol Use: Yes     Comment: occ    Allergies  Allergen Reactions  . Latex Itching  . Lisinopril Other (See Comments)    coughing    Current Outpatient Prescriptions  Medication Sig Dispense Refill  . buPROPion (WELLBUTRIN SR) 150 MG 12 hr tablet Take 1 tablet (150 mg total) by mouth daily. 30 tablet 3  . cyclobenzaprine (FLEXERIL) 10 MG tablet Take 1 tablet (10 mg total) by mouth 3 (three) times daily as needed  for muscle spasms. 30 tablet 1  . hydrochlorothiazide (MICROZIDE) 12.5 MG capsule Take 1 capsule (12.5 mg total) by mouth daily. 30 capsule 11  . naproxen (NAPROSYN) 500 MG tablet Take 1 tablet (500 mg total) by mouth 2 (two) times daily. 14 tablet 0  . phentermine 37.5 MG capsule Take 1 capsule (37.5 mg total) by mouth every morning. 30 capsule 0  . HYDROcodone-acetaminophen (NORCO/VICODIN) 5-325 MG tablet Take 1 tablet by mouth every 6 (six) hours as needed. 10 tablet 0  . [DISCONTINUED] promethazine (PHENERGAN) 25 MG tablet Take 1 tablet (25 mg total) by mouth every 6 (six) hours as needed for nausea or vomiting. 30 tablet 0   Current Facility-Administered Medications  Medication Dose Route Frequency Provider Last Rate Last Dose  . cefTRIAXone (ROCEPHIN) 2 g in dextrose 5 % 50 mL IVPB  2 g Intravenous Once Tilda BurrowJohn Cerenity Goshorn V, MD        Review of Systems Review of Systems  Blood pressure 140/90, pulse 66, height 5\' 1"  (1.549 m), weight 200 lb (90.719 kg), last menstrual period 03/11/2016.  Physical Exam Physical Exam  Data Reviewed Old chart   Assessment    Prepping with  Betadine Local anesthesia with 1% Buffered Lidocaine 1 cm excisional biopsy performed per protocol single stitch 4-0 vicryl subq to reduce bleeding. Silver nitrate inefetive. Silver Nitrate applied:  Yes Well tolerated  Specimen appropriately identified and sent to pathology    Plan    Follow-up:  2 weeks      Amarachukwu Lakatos V 03/19/2016, 5:04 PM

## 2016-03-27 ENCOUNTER — Ambulatory Visit: Payer: BLUE CROSS/BLUE SHIELD | Admitting: Obstetrics and Gynecology

## 2016-04-10 ENCOUNTER — Ambulatory Visit (INDEPENDENT_AMBULATORY_CARE_PROVIDER_SITE_OTHER): Payer: BLUE CROSS/BLUE SHIELD | Admitting: Adult Health

## 2016-04-10 ENCOUNTER — Encounter: Payer: Self-pay | Admitting: Adult Health

## 2016-04-10 VITALS — BP 128/70 | HR 68 | Ht 61.0 in | Wt 193.0 lb

## 2016-04-10 DIAGNOSIS — N920 Excessive and frequent menstruation with regular cycle: Secondary | ICD-10-CM

## 2016-04-10 DIAGNOSIS — Z6836 Body mass index (BMI) 36.0-36.9, adult: Secondary | ICD-10-CM

## 2016-04-10 DIAGNOSIS — N946 Dysmenorrhea, unspecified: Secondary | ICD-10-CM | POA: Insufficient documentation

## 2016-04-10 DIAGNOSIS — Z713 Dietary counseling and surveillance: Secondary | ICD-10-CM | POA: Diagnosis not present

## 2016-04-10 DIAGNOSIS — M62830 Muscle spasm of back: Secondary | ICD-10-CM

## 2016-04-10 HISTORY — DX: Dysmenorrhea, unspecified: N94.6

## 2016-04-10 HISTORY — DX: Body mass index (BMI) 36.0-36.9, adult: Z68.36

## 2016-04-10 HISTORY — DX: Excessive and frequent menstruation with regular cycle: N92.0

## 2016-04-10 MED ORDER — PHENTERMINE HCL 37.5 MG PO CAPS: 37.5000 mg | ORAL_CAPSULE | ORAL | Status: DC

## 2016-04-10 MED ORDER — IBUPROFEN 800 MG PO TABS: 800.0000 mg | ORAL_TABLET | Freq: Three times a day (TID) | ORAL | Status: DC | PRN

## 2016-04-10 NOTE — Patient Instructions (Signed)
Follow up in 4 weeks If not better will get US  Continue adipex and walk Take motrin use flexeril and ice

## 2016-04-10 NOTE — Progress Notes (Signed)
Subjective:     Patient ID: Destiny Petty, female   DOB: 1981-11-15, 35 y.o.   MRN: [id number]  HPI Destiny Petty is a 35 year old white female, in for weight and BP check.She is complaining of low back pain esp, with periods and that periods are heavier and changes every 2 hours or so and has pain with periods, since had Gallbladder removed.  Review of Systems Patient denies any headaches, hearing loss, fatigue, blurred vision, shortness of breath, chest pain,  problems with bowel movements, urination, or intercourse. No joint pain or mood swings. See HPI for positives. Reviewed past medical,surgical, social and family history. Reviewed medications and allergies.     Objective:   Physical Exam BP 128/70 mmHg  Pulse 68  Ht 5\' 1"  (1.549 m)  Wt 193 lb (87.544 kg)  BMI 36.49 kg/m2  LMP 04/09/2016 has lost 7 lbs this month, Skin warm and dry, no lesions. Lungs: clear to ausculation bilaterally. Cardiovascular: regular rate and rhythm. Has some tenderness lower mid back, muscles are tight, no change in gait.Will try some motrin and if not better will get US. Will continue adipex.    Assessment:     Weight loss counseling BMI 36.49 Menorrhagia Dysmenorrhea Back pain due to muscle spasms    Plan:     Rx adipex 37.5 mg #30 take 1 daily no refills Rx motrin 800 mg #60 tke 1 every 8 hours prn pain with 1 refill Continue flexeril and try ice Follow up in 4 weeks, may get US to assess uterus then

## 2016-05-08 ENCOUNTER — Encounter: Payer: Self-pay | Admitting: Adult Health

## 2016-05-08 ENCOUNTER — Ambulatory Visit (INDEPENDENT_AMBULATORY_CARE_PROVIDER_SITE_OTHER): Payer: BLUE CROSS/BLUE SHIELD | Admitting: Adult Health

## 2016-05-08 VITALS — BP 138/92 | HR 90 | Ht 62.0 in | Wt 193.0 lb

## 2016-05-08 DIAGNOSIS — Z713 Dietary counseling and surveillance: Secondary | ICD-10-CM | POA: Diagnosis not present

## 2016-05-08 DIAGNOSIS — Z6835 Body mass index (BMI) 35.0-35.9, adult: Secondary | ICD-10-CM | POA: Diagnosis not present

## 2016-05-08 MED ORDER — PHENTERMINE HCL 37.5 MG PO CAPS: 37.5000 mg | ORAL_CAPSULE | ORAL | Status: DC

## 2016-05-08 NOTE — Progress Notes (Signed)
Subjective:     Patient ID: Destiny Petty, female   DOB: 08/04/1981, 35 y.o.   MRN: [id number]  HPI Destiny Petty is a 35 year old white female in for weight and BP check, is on adipex for weight loss.  Review of Systems Patient denies any headaches, hearing loss, fatigue, blurred vision, shortness of breath, chest pain, abdominal pain, problems with bowel movements, urination, or intercourse. No joint pain or mood swings. Reviewed past medical,surgical, social and family history. Reviewed medications and allergies.     Objective:   Physical Exam BP 138/92 mmHg  Pulse 90  Ht 5\' 2"  (1.575 m)  Wt 193 lb (87.544 kg)  BMI 35.29 kg/m2  LMP 05/07/2016 Skin warm and dry.  Lungs: clear to ausculation bilaterally. Cardiovascular: regular rate and rhythm.   Discussed increasing exercise and more water. Has lost 7 since 03/13/16, but none this month.  Assessment:     Weight loss counseling BMI 35.29    Plan:    increase exercise and water Refilled adipex 37.5 mg #30 take 1 daily no refills Follow up in 4 weeks

## 2016-05-08 NOTE — Patient Instructions (Signed)
Increase water and exercise Follow up in 4 weeks 

## 2016-05-20 ENCOUNTER — Other Ambulatory Visit: Payer: Self-pay | Admitting: Adult Health

## 2016-05-27 ENCOUNTER — Other Ambulatory Visit: Payer: Self-pay | Admitting: Adult Health

## 2016-06-05 ENCOUNTER — Ambulatory Visit: Payer: BLUE CROSS/BLUE SHIELD | Admitting: Adult Health

## 2016-06-15 ENCOUNTER — Other Ambulatory Visit: Payer: Self-pay | Admitting: Adult Health

## 2016-06-18 ENCOUNTER — Ambulatory Visit (INDEPENDENT_AMBULATORY_CARE_PROVIDER_SITE_OTHER): Payer: BLUE CROSS/BLUE SHIELD | Admitting: Adult Health

## 2016-06-18 ENCOUNTER — Encounter: Payer: Self-pay | Admitting: Adult Health

## 2016-06-18 VITALS — BP 122/88 | HR 92 | Ht 62.0 in | Wt 189.0 lb

## 2016-06-18 DIAGNOSIS — M5431 Sciatica, right side: Secondary | ICD-10-CM | POA: Diagnosis not present

## 2016-06-18 DIAGNOSIS — Z6834 Body mass index (BMI) 34.0-34.9, adult: Secondary | ICD-10-CM | POA: Diagnosis not present

## 2016-06-18 DIAGNOSIS — Z713 Dietary counseling and surveillance: Secondary | ICD-10-CM

## 2016-06-18 DIAGNOSIS — E669 Obesity, unspecified: Secondary | ICD-10-CM | POA: Diagnosis not present

## 2016-06-18 HISTORY — DX: Sciatica, right side: M54.31

## 2016-06-18 MED ORDER — NAPROXEN SODIUM 275 MG PO TABS: ORAL_TABLET | ORAL | Status: DC

## 2016-06-18 MED ORDER — PHENTERMINE HCL 37.5 MG PO CAPS: 37.5000 mg | ORAL_CAPSULE | ORAL | Status: DC

## 2016-06-18 NOTE — Patient Instructions (Signed)
Continue weight loss Follow up in 4 weeks 

## 2016-06-18 NOTE — Progress Notes (Signed)
Subjective:     Patient ID: Destiny Petty, female   DOB: November 08, 1981, 35 y.o.   MRN: [id number]  HPI Destiny Petty is a 35 year old white female in for weight and BP check, she has lost 4 lbs this month,she has low back pain that is worse around time of period and now seems to radiate down right leg at times.She has had vein surgery on left leg and is wondering if needs on right, to talk with PCP about that.  Review of Systems + back pain radiating down right leg Patient denies any headaches, hearing loss, fatigue, blurred vision, shortness of breath, chest pain, abdominal pain, problems with bowel movements, urination, or intercourse. No joint pain or mood swings. Reviewed past medical,surgical, social and family history. Reviewed medications and allergies.     Objective:   Physical Exam BP 122/88 mmHg  Pulse 92  Ht 5\' 2"  (1.575 m)  Wt 189 lb (85.73 kg)  BMI 34.56 kg/m2  LMP 06/10/2016 Skin warm and dry.  Lungs: clear to ausculation bilaterally. Cardiovascular: regular rate and rhythm.No gait changes.   Has lost 4 more pounds and wants to continue adipex.  Assessment:     Weight loss counseling BMI 34.56 Back pain with right sciatica     Plan:     Rx adipex 37.5 mg #30 take 1 daily Rx anaprox 275 mg #60 take 2 bid with food with 1 refill Keep flexeril prn Use ice Follow up in 4 weeks

## 2016-06-20 ENCOUNTER — Ambulatory Visit: Payer: BLUE CROSS/BLUE SHIELD | Admitting: Adult Health

## 2016-06-24 ENCOUNTER — Other Ambulatory Visit: Payer: Self-pay | Admitting: Family Medicine

## 2016-06-24 DIAGNOSIS — N6452 Nipple discharge: Secondary | ICD-10-CM

## 2016-06-27 ENCOUNTER — Ambulatory Visit
Admission: RE | Admit: 2016-06-27 | Discharge: 2016-06-27 | Disposition: A | Discharge status: Home / Self Care | Payer: BLUE CROSS/BLUE SHIELD | Source: Ambulatory Visit | Attending: Family Medicine | Admitting: Family Medicine

## 2016-06-27 DIAGNOSIS — N6452 Nipple discharge: Secondary | ICD-10-CM

## 2016-07-03 ENCOUNTER — Telehealth: Payer: Self-pay | Admitting: Adult Health

## 2016-07-03 MED ORDER — FLUCONAZOLE 150 MG PO TABS
150.0000 mg | ORAL_TABLET | Freq: Once | ORAL | 2 refills | Status: AC
Start: 2016-07-03 — End: 2016-07-03

## 2016-07-03 NOTE — Telephone Encounter (Signed)
Will rx diflucan take 1 now and 2 refills

## 2016-07-03 NOTE — Telephone Encounter (Signed)
Pt called stating that she would like a call back from [place] as soon as possible. Pt did not state the reason why. Please contact pt

## 2016-07-03 NOTE — Telephone Encounter (Signed)
Spoke with pt. Pt was put on Keflex for a nipple infection from [facility]. Pt thinks she has a yeast infection. She states she always gets one with Keflex. Monistat has been no help. Can you prescribe Diflucan? Thanks!! JSY

## 2016-07-16 ENCOUNTER — Ambulatory Visit: Payer: BLUE CROSS/BLUE SHIELD | Admitting: Adult Health

## 2016-07-17 ENCOUNTER — Ambulatory Visit (INDEPENDENT_AMBULATORY_CARE_PROVIDER_SITE_OTHER): Payer: BLUE CROSS/BLUE SHIELD | Admitting: Adult Health

## 2016-07-17 ENCOUNTER — Encounter: Payer: Self-pay | Admitting: Adult Health

## 2016-07-17 VITALS — BP 134/80 | HR 82 | Ht 62.0 in | Wt 186.0 lb

## 2016-07-17 DIAGNOSIS — E669 Obesity, unspecified: Secondary | ICD-10-CM

## 2016-07-17 DIAGNOSIS — Z713 Dietary counseling and surveillance: Secondary | ICD-10-CM

## 2016-07-17 DIAGNOSIS — Z6834 Body mass index (BMI) 34.0-34.9, adult: Secondary | ICD-10-CM

## 2016-07-17 MED ORDER — PHENTERMINE HCL 37.5 MG PO CAPS: 37.5000 mg | ORAL_CAPSULE | ORAL | 0 refills | Status: DC

## 2016-07-17 NOTE — Patient Instructions (Signed)
Continue weight loss efforts Follow up in 4 weeks 

## 2016-07-17 NOTE — Progress Notes (Signed)
Subjective:     Patient ID: Destiny Petty, female   DOB: 1981/04/12, 35 y.o.   MRN: [id number]  HPI Destiny Petty is a 35 year old white female, married in for weight and BP check.She moved in her new house last week, and she is all smiles.  Review of Systems Patient denies any headaches, hearing loss, fatigue, blurred vision, shortness of breath, chest pain, abdominal pain, problems with bowel movements, urination, or intercourse. No joint pain or mood swings. Reviewed past medical,surgical, social and family history. Reviewed medications and allergies.     Objective:   Physical Exam BP 134/80 (BP Location: Left Arm, Patient Position: Sitting, Cuff Size: Normal)   Pulse 82   Ht 5\' 2"  (1.575 m)   Wt 186 lb (84.4 kg)   LMP 07/08/2016   BMI 34.02 kg/m  Skin warm and dry. Lungs: clear to ausculation bilaterally. Cardiovascular: regular rate and rhythm.   She lost 3 more pounds and wants to continue adipex, her goal is 165 lbs.  Assessment:     Weight loss counseling BMI 34.02     Plan:     Rx adipex 37.5 mg #30 take 1 daily Follow up in 4 weeks

## 2016-08-14 ENCOUNTER — Ambulatory Visit: Payer: BLUE CROSS/BLUE SHIELD | Admitting: Adult Health

## 2016-08-19 ENCOUNTER — Ambulatory Visit (INDEPENDENT_AMBULATORY_CARE_PROVIDER_SITE_OTHER): Payer: BLUE CROSS/BLUE SHIELD | Admitting: Adult Health

## 2016-08-19 ENCOUNTER — Encounter: Payer: Self-pay | Admitting: Adult Health

## 2016-08-19 VITALS — BP 120/80 | HR 92 | Ht 61.0 in | Wt 192.0 lb

## 2016-08-19 DIAGNOSIS — I8392 Asymptomatic varicose veins of left lower extremity: Secondary | ICD-10-CM

## 2016-08-19 DIAGNOSIS — Z713 Dietary counseling and surveillance: Secondary | ICD-10-CM | POA: Diagnosis not present

## 2016-08-19 DIAGNOSIS — R202 Paresthesia of skin: Secondary | ICD-10-CM | POA: Diagnosis not present

## 2016-08-19 DIAGNOSIS — M5431 Sciatica, right side: Secondary | ICD-10-CM | POA: Diagnosis not present

## 2016-08-19 DIAGNOSIS — R200 Anesthesia of skin: Secondary | ICD-10-CM

## 2016-08-19 DIAGNOSIS — R110 Nausea: Secondary | ICD-10-CM

## 2016-08-19 DIAGNOSIS — E669 Obesity, unspecified: Secondary | ICD-10-CM

## 2016-08-19 DIAGNOSIS — Z6836 Body mass index (BMI) 36.0-36.9, adult: Secondary | ICD-10-CM

## 2016-08-19 HISTORY — DX: Anesthesia of skin: R20.0

## 2016-08-19 HISTORY — DX: Paresthesia of skin: R20.2

## 2016-08-19 MED ORDER — PROMETHAZINE HCL 25 MG PO TABS: 25.0000 mg | ORAL_TABLET | Freq: Four times a day (QID) | ORAL | 1 refills | Status: DC | PRN

## 2016-08-19 MED ORDER — PHENTERMINE HCL 37.5 MG PO CAPS: 37.5000 mg | ORAL_CAPSULE | ORAL | 0 refills | Status: DC

## 2016-08-19 NOTE — Progress Notes (Signed)
Subjective:     Patient ID: Destiny Petty, female   DOB: 07/16/1981, 35 y.o.   MRN: [id number]  HPI Destiny Petty is a 35 year old white female in for weight and BP check has been on adipex.She is complaining of nausea for 3 days, no vomiting and has had Gallbladder removed.Also has pain in back and legs hurt, has varicose veins now more prominent and left hand feels numb and tingles and she will drop things can't she can't feel it.She said she fell many years ago.   Review of Systems + nausea for 3 days, no vomiting Back pain  Numbness and tingling left hand Pain in legs, has varicose veins  Reviewed past medical,surgical, social and family history. Reviewed medications and allergies.     Objective:   Physical Exam BP 120/80 (BP Location: Left Arm, Patient Position: Sitting, Cuff Size: Normal)   Pulse 92   Ht 5\' 1"  (1.549 m)   Wt 192 lb (87.1 kg)   LMP 08/04/2016   BMI 36.28 kg/m  Skin warm and dry. Lungs: clear to ausculation bilaterally. Cardiovascular: regular rate and rhythm.  Negative Tinel's sign and Phalen's test, has varicose veins in left leg.Abdomen is soft and non tender.She gained about 6 lbs but she has not been exercising as much esp since moving in new house. Will refer to orthopedic and will get her to call [facility].   Assessment:     1. Weight loss counseling, encounter for   2. Body mass index 36.0-36.9, adult   3. Back pain with right-sided sciatica   4. Nausea   5. Numbness and tingling in left hand   6.      Varicose veins left leg    Plan:     Rx adipex 37.5 mg #30 take 1 daily  Rx phenergan 25 mg #30 take 1 every 6 hours prn, with 1 refill, eat light  Referred to Dr Romeo AppleHarrison for back and left hand Pt to call [facility] specialist about veins in legs  Follow up in 4 weeks

## 2016-08-19 NOTE — Patient Instructions (Signed)
Call [facility] Refer to Dr Keeling/Harriosn Follow up in 4 weeks

## 2016-08-29 ENCOUNTER — Telehealth: Payer: Self-pay | Admitting: Adult Health

## 2016-08-29 NOTE — Telephone Encounter (Signed)
Pt informed Cyril MourningJennifer Griffin, NP completed referral in [facility] for [facility] [place] Harrison, [place], located in [place]. Pt was given phone number to call and f/u for an appt.

## 2016-09-03 ENCOUNTER — Encounter: Payer: Self-pay | Admitting: Orthopaedic Surgery

## 2016-09-03 ENCOUNTER — Ambulatory Visit (INDEPENDENT_AMBULATORY_CARE_PROVIDER_SITE_OTHER): Payer: BLUE CROSS/BLUE SHIELD | Admitting: Orthopaedic Surgery

## 2016-09-03 ENCOUNTER — Ambulatory Visit (INDEPENDENT_AMBULATORY_CARE_PROVIDER_SITE_OTHER): Payer: BLUE CROSS/BLUE SHIELD

## 2016-09-03 VITALS — BP 146/99 | HR 87 | Temp 97.5°F | Ht 63.0 in | Wt 190.0 lb

## 2016-09-03 DIAGNOSIS — M545 Low back pain, unspecified: Secondary | ICD-10-CM

## 2016-09-03 DIAGNOSIS — F17200 Nicotine dependence, unspecified, uncomplicated: Secondary | ICD-10-CM

## 2016-09-03 MED ORDER — CYCLOBENZAPRINE HCL 10 MG PO TABS: 10.0000 mg | ORAL_TABLET | Freq: Three times a day (TID) | ORAL | 1 refills | Status: DC | PRN

## 2016-09-03 MED ORDER — NAPROXEN 500 MG PO TABS: 500.0000 mg | ORAL_TABLET | Freq: Two times a day (BID) | ORAL | 5 refills | Status: DC

## 2016-09-03 MED ORDER — HYDROCODONE-ACETAMINOPHEN 5-325 MG PO TABS: 1.0000 | ORAL_TABLET | ORAL | 0 refills | Status: DC | PRN

## 2016-09-03 NOTE — Patient Instructions (Signed)
Smoking Cessation, Tips for Success If you are ready to quit smoking, congratulations! You have chosen to help yourself be healthier. Cigarettes bring nicotine, tar, carbon monoxide, and other irritants into your body. Your lungs, heart, and blood vessels will be able to work better without these poisons. There are many different ways to quit smoking. Nicotine gum, nicotine patches, a nicotine inhaler, or nicotine nasal spray can help with physical craving. Hypnosis, support groups, and medicines help break the habit of smoking. WHAT THINGS CAN I DO TO MAKE QUITTING EASIER?  Here are some tips to help you quit for good:  Pick a date when you will quit smoking completely. Tell all of your friends and family about your plan to quit on that date.  Do not try to slowly cut down on the number of cigarettes you are smoking. Pick a quit date and quit smoking completely starting on that day.  Throw away all cigarettes.   Clean and remove all ashtrays from your home, work, and car.  On a card, write down your reasons for quitting. Carry the card with you and read it when you get the urge to smoke.  Cleanse your body of nicotine. Drink enough water and fluids to keep your urine clear or pale yellow. Do this after quitting to flush the nicotine from your body.  Learn to predict your moods. Do not let a bad situation be your excuse to have a cigarette. Some situations in your life might tempt you into wanting a cigarette.  Never have "just one" cigarette. It leads to wanting another and another. Remind yourself of your decision to quit.  Change habits associated with smoking. If you smoked while driving or when feeling stressed, try other activities to replace smoking. Stand up when drinking your coffee. Brush your teeth after eating. Sit in a different chair when you read the paper. Avoid alcohol while trying to quit, and try to drink fewer caffeinated beverages. Alcohol and caffeine may urge you to  smoke.  Avoid foods and drinks that can trigger a desire to smoke, such as sugary or spicy foods and alcohol.  Ask people who smoke not to smoke around you.  Have something planned to do right after eating or having a cup of coffee. For example, plan to take a walk or exercise.  Try a relaxation exercise to calm you down and decrease your stress. Remember, you may be tense and nervous for the first 2 weeks after you quit, but this will pass.  Find new activities to keep your hands busy. Play with a pen, coin, or rubber band. Doodle or draw things on paper.  Brush your teeth right after eating. This will help cut down on the craving for the taste of tobacco after meals. You can also try mouthwash.   Use oral substitutes in place of cigarettes. Try using lemon drops, carrots, cinnamon sticks, or chewing gum. Keep them handy so they are available when you have the urge to smoke.  When you have the urge to smoke, try deep breathing.  Designate your home as a nonsmoking area.  If you are a heavy smoker, ask your health care provider about a prescription for nicotine chewing gum. It can ease your withdrawal from nicotine.  Reward yourself. Set aside the cigarette money you save and buy yourself something nice.  Look for support from others. Join a support group or smoking cessation program. Ask someone at home or at work to help you with your plan   to quit smoking.  Always ask yourself, "Do I need this cigarette or is this just a reflex?" Tell yourself, "Today, I choose not to smoke," or "I do not want to smoke." You are reminding yourself of your decision to quit.  Do not replace cigarette smoking with electronic cigarettes (commonly called e-cigarettes). The safety of e-cigarettes is unknown, and some may contain harmful chemicals.  If you relapse, do not give up! Plan ahead and think about what you will do the next time you get the urge to smoke. HOW WILL I FEEL WHEN I QUIT SMOKING? You  may have symptoms of withdrawal because your body is used to nicotine (the addictive substance in cigarettes). You may crave cigarettes, be irritable, feel very hungry, cough often, get headaches, or have difficulty concentrating. The withdrawal symptoms are only temporary. They are strongest when you first quit but will go away within 10-14 days. When withdrawal symptoms occur, stay in control. Think about your reasons for quitting. Remind yourself that these are signs that your body is healing and getting used to being without cigarettes. Remember that withdrawal symptoms are easier to treat than the major diseases that smoking can cause.  Even after the withdrawal is over, expect periodic urges to smoke. However, these cravings are generally short lived and will go away whether you smoke or not. Do not smoke! WHAT RESOURCES ARE AVAILABLE TO HELP ME QUIT SMOKING? Your health care provider can direct you to community resources or hospitals for support, which may include:  Group support.  Education.  Hypnosis.  Therapy.   This information is not intended to replace advice given to you by your health care provider. Make sure you discuss any questions you have with your health care provider.   Document Released: 08/23/2004 Document Revised: 12/16/2014 Document Reviewed: 05/13/2013 Elsevier Interactive Patient Education 2016 Elsevier Inc.  

## 2016-09-03 NOTE — Progress Notes (Signed)
Subjective:  My back hurts    Patient ID: Destiny Petty, female    DOB: Apr 28, 1981, 35 y.o.   MRN: [id number]  HPI She has long history of lower back pain worse over the last several months. She has pain in the lower back that radiates to both legs at times but is mainly localized to the lower back.  She has no trauma. She has no bowel or bladder problem. She has tried ibuprofen, Flexeril, rest and exercises. She has seen her OB/GYN for this.  They asked that I see her.  She had a fall about a year ago and had back pain for a brief time.  She is not getting better.   Review of Systems  HENT: Positive for congestion.   Respiratory: Positive for shortness of breath. Negative for cough.   Cardiovascular: Negative for chest pain and leg swelling.  Endocrine: Negative for cold intolerance.  Musculoskeletal: Positive for arthralgias and back pain.  Allergic/Immunologic: Negative for environmental allergies.   Past Medical History:  Diagnosis Date  . Anxiety   . Asthma   . Back pain with right-sided sciatica 06/18/2016  . Body mass index 36.0-36.9, adult 04/10/2016  . Body mass index 37.0-37.9, adult 03/13/2016  . Depression   . Dysmenorrhea 04/10/2016  . Hemorrhoids 02/10/2015  . Hiatal hernia   . Hypertension   . Menorrhagia with regular cycle 04/10/2016  . Muscle spasm of back 03/13/2016  . Obesity   . Perianal wart 02/10/2015  . Stress 02/14/2016  . Weight loss counseling, encounter for 03/13/2016    Past Surgical History:  Procedure Laterality Date  . CESAREAN SECTION    . CHOLECYSTECTOMY    . COLONOSCOPY    . ESOPHAGOGASTRODUODENOSCOPY ENDOSCOPY    . TUBAL LIGATION      Current Outpatient Prescriptions on File Prior to Visit  Medication Sig Dispense Refill  . ALBUTEROL IN Inhale into the lungs as needed.    [name]. buPROPion (WELLBUTRIN SR) 150 MG 12 hr tablet TAKE ONE TABLET BY MOUTH ONCE DAILY. 30 tablet 3  . fluticasone (FLONASE) 50 MCG/ACT nasal spray Place 2 sprays into both  nostrils daily.     . hydrochlorothiazide (MICROZIDE) 12.5 MG capsule Take 1 capsule (12.5 mg total) by mouth daily. 30 capsule 11  . phentermine 37.5 MG capsule Take 1 capsule (37.5 mg total) by mouth every morning. 30 capsule 0  . promethazine (PHENERGAN) 25 MG tablet Take 1 tablet (25 mg total) by mouth every 6 (six) hours as needed for nausea or vomiting. 30 tablet 1   No current facility-administered medications on file prior to visit.     Social History   Social History  . Marital status: Legally Separated    Spouse name: N/A  . Number of children: N/A  . Years of education: N/A   Occupational History  . Not on file.   Social History Main Topics  . Smoking status: Current Every Day Smoker    Packs/day: 1.00    Years: 18.00    Types: Cigarettes  . Smokeless tobacco: Never Used  . Alcohol use Yes     Comment: occ  . Drug use: No  . Sexual activity: Yes    Birth control/ protection: Surgical     Comment: tubal   Other Topics Concern  . Not on file   Social History Narrative  . No narrative on file    Family History  Problem Relation Age of Onset  . Diabetes Mother   .  Kidney failure Mother   . Depression Sister   . Hypertension Sister   . Anxiety disorder Sister   . Other Sister     back problems  . ADD / ADHD Daughter   . Other Daughter     behavioral issues  . ODD Daughter   . Asthma Son   . Cancer Maternal Grandmother   . Seizures Maternal Grandmother     BP (!) 146/99   Pulse 87   Temp 97.5 F (36.4 C)   Ht 5\' 3"  (1.6 m)   Wt 190 lb (86.2 kg)   LMP 08/04/2016 (Exact Date)   BMI 33.66 kg/m      Objective:   Physical Exam  Constitutional: She is oriented to person, place, and time. She appears well-developed and well-nourished.  HENT:  Head: Normocephalic and atraumatic.  Eyes: Conjunctivae and EOM are normal. Pupils are equal, round, and reactive to light.  Neck: Normal range of motion. Neck supple.  Cardiovascular: Normal rate, regular  rhythm and intact distal pulses.   Pulmonary/Chest: Effort normal.  Abdominal: Soft.  Musculoskeletal: She exhibits tenderness (Tender mid lower back with no paresthesias.  ROM full, NV intact, no spasm.).  Neurological: She is alert and oriented to person, place, and time. She displays normal reflexes. No cranial nerve deficit. She exhibits normal muscle tone. Coordination normal.  Skin: Skin is warm and dry.  Psychiatric: She has a normal mood and affect. Her behavior is normal. Judgment and thought content normal.     X-rays were done of the lumbar spine and reported separately.     Assessment & Plan:   Encounter Diagnoses  Name Primary?  . Midline low back pain without sciatica Yes  . Nicotine use disorder    I will have her begin PT.  I have ordered pain medicine, refilled Flexeril, stop ibuprofen and begin Naprosyn.  Return in three weeks.  She smokes and is willing to cut back or stop.  Call if any problem.  Precautions discussed.  Electronically Signed Darreld Mclean, MD 9/26/20171:30 PM

## 2016-09-10 ENCOUNTER — Ambulatory Visit (HOSPITAL_COMMUNITY): Payer: BLUE CROSS/BLUE SHIELD | Attending: Orthopaedic Surgery | Admitting: Physical Therapy

## 2016-09-10 DIAGNOSIS — M5416 Radiculopathy, lumbar region: Secondary | ICD-10-CM

## 2016-09-10 DIAGNOSIS — M79604 Pain in right leg: Secondary | ICD-10-CM | POA: Diagnosis present

## 2016-09-10 DIAGNOSIS — M6281 Muscle weakness (generalized): Secondary | ICD-10-CM

## 2016-09-10 NOTE — Therapy (Signed)
[facility] [address] , [place], [address] Phone: [phone]   Fax:  [phone]  Physical Therapy Evaluation  Patient Details  Name: Destiny Petty L Ramirez Garcia MRN: [id number] Date of Birth: 1981/02/18 Referring Provider: Darreld McleanWayne Keeling  Encounter Date: 09/10/2016      PT End of Session - 09/10/16 1613    Visit Number 1   Number of Visits 8   Date for PT Re-Evaluation 10/10/16   Authorization Type BCBS   Authorization - Visit Number 1   Authorization - Number of Visits 8   PT Start Time 1515   PT Stop Time 1600   PT Time Calculation (min) 45 min   Activity Tolerance Patient tolerated treatment well   Behavior During Therapy [facility] for tasks assessed/performed      Past Medical History:  Diagnosis Date  . Anxiety   . Asthma   . Back pain with right-sided sciatica 06/18/2016  . Body mass index 36.0-36.9, adult 04/10/2016  . Body mass index 37.0-37.9, adult 03/13/2016  . Depression   . Dysmenorrhea 04/10/2016  . Hemorrhoids 02/10/2015  . Hiatal hernia   . Hypertension   . Menorrhagia with regular cycle 04/10/2016  . Muscle spasm of back 03/13/2016  . Obesity   . Perianal wart 02/10/2015  . Stress 02/14/2016  . Weight loss counseling, encounter for 03/13/2016    Past Surgical History:  Procedure Laterality Date  . CESAREAN SECTION    . CHOLECYSTECTOMY    . COLONOSCOPY    . ESOPHAGOGASTRODUODENOSCOPY ENDOSCOPY    . TUBAL LIGATION      There were no vitals filed for this visit.       Subjective Assessment - 09/10/16 1507    Subjective Ms. [name] has had radicular back pain for several months which is progresive in nature.  She went to the MD who has referred her to physcial therapy.   She states that her main concern is the pain in her right leg.  She states that she gets a knot that swells up in her mid back.  [place] to [place], ([place] she has pain that goes up her right leg into her back.  She is  unable to go to the grocery store due to the pain and is having difficulty with going up and down steps.   She states that the pain is so severe that she is unable to sleep.  She is only getting 2-3 hours of sleep a night.    Pertinent History HTN, asthma ,    Limitations Sitting;Reading;Lifting;Standing;Walking   How long can you sit comfortably? Able sit 30 minutes    How long can you stand comfortably? 20 minutes    How long can you walk comfortably? an hour    Patient Stated Goals less pain, to be able to walk better, to ride in the car, go up and down steps    Currently in Pain? Yes   Pain Score 10-Worst pain ever  8/10 when compared to a bomb going off    Pain Location Back   Pain Orientation Mid;Lower   Pain Descriptors / Indicators Aching;Burning;Throbbing   Pain Type Chronic pain   Pain Radiating Towards Rt foot    Pain Onset More than a month ago   Pain Frequency Constant   Aggravating Factors  car riding; weight bearing    Pain Relieving Factors lying supine    Effect of Pain on Daily Activities increases  [facility] PT Assessment - 09/10/16 0001      Assessment   Medical Diagnosis Rt radiculopathy    Referring Provider Darreld Mclean   Onset Date/Surgical Date 07/09/16   Next MD Visit 09/24/2016   Prior Therapy none      Precautions   Precautions None     Restrictions   Weight Bearing Restrictions No     Balance Screen   Has the patient fallen in the past 6 months No   Has the patient had a decrease in activity level because of a fear of falling?  Yes   Is the patient reluctant to leave their home because of a fear of falling?  No     Home Environment   Living Environment Private residence   Type of Home House   Home Access Stairs to enter   Entrance Stairs-Number of Steps 3     Prior Function   Level of Independence Independent   Vocation Full time employment   Vocation Requirements paper work, sitting computer work    Leisure spend time with  children      Cognition   Overall Cognitive Status Within Functional Limits for tasks assessed     Observation/Other Assessments   Focus on Therapeutic Outcomes (FOTO)  37     Functional Tests   Functional tests Single leg stance;Sit to Stand     Single Leg Stance   Comments 8 Lt; 14 RT      Sit to Stand   Comments 5x in 24.86 using hands on thigh      ROM / Strength   AROM / PROM / Strength Strength     Strength   Strength Assessment Site Hip;Knee;Ankle   Right/Left Hip Right;Left   Right Hip Extension 4/5   Right Hip ABduction 5/5   Left Hip Extension 4/5   Left Hip ABduction 5/5   Right/Left Knee Right;Left   Right Knee Extension 5/5   Left Knee Extension 5/5   Right/Left Ankle Right;Left   Right Ankle Dorsiflexion 5/5   Left Ankle Dorsiflexion 5/5     Flexibility   Soft Tissue Assessment /Muscle Length yes   Hamstrings bilateral 170    Quadriceps normal      Palpation   SI assessment  no issues   Palpation comment no spasms but tender to palpation at L45                    [facility] Adult PT Treatment/Exercise - 09/10/16 0001      Exercises   Exercises Lumbar     Lumbar Exercises: Stretches   Single Knee to Chest Stretch 2 reps;30 seconds     Lumbar Exercises: Supine   Ab Set 5 reps     Lumbar Exercises: Prone   Single Arm Raise 10 reps   Other Prone Lumbar Exercises heel squeeze, glut squeeze x10                 PT Education - 09/10/16 1601    Education provided Yes   Education Details HEP   Person(s) Educated Patient   Methods Explanation   Comprehension Verbalized understanding;Verbal cues required          PT Short Term Goals - 09/10/16 1632      PT SHORT TERM GOAL #1   Title Pt radicular pain to be no greater than a 5/10 to allow pt to sleep for 4 hours a night.    Time 2   Period Weeks  Status New     PT SHORT TERM GOAL #2   Title Pt LE and core strength to be increased by 1/2 grade to allow pt to be able to  come sit to stand with ease    Time 2   Period Weeks   Status New     PT SHORT TERM GOAL #3   Title Pt to be able to complete a 15 minute shopping trip with no increase back or leg pain    Time 2   Period Weeks           PT Long Term Goals - 09/10/16 1634      PT LONG TERM GOAL #1   Title Pt back and leg  pain to be decreased to no greater than a 3/10 to allow pt to drive to [place] in comfort    Time 4   Period Weeks   Status New     PT LONG TERM GOAL #2   Title Pt core and LE strength increased by one grade to allow pt to go up and down steps without difficulty.    Time 4   Period Weeks   Status New     PT LONG TERM GOAL #3   Title Pt to be walking 30 minutes a day for continued maintance of self back care.    Time 4   Period Weeks   Status New     PT LONG TERM GOAL #4   Title Pt to demonstrate proper sitting posture including the use of a towel roll for decreased stress on low back during traveling and work duties    Time 4   Period Weeks               Plan - 09/10/16 1615    Clinical Impression Statement Ms Derrell Lolling is a 35 yo female who has had chronic rt radicular back pain.  She states that her pain has increased significantly in the recent months to where she is unable to sleep or do normal tasks such as grocery shopping.   She has been referred to skilled  physical therapy to address these issues to maximize her functional ability.   Examination demonstrated decreased core and LE strength, decreased balance,improper body mechanics.      Rehab Potential Good   PT Frequency 2x / week   PT Duration 4 weeks   PT Treatment/Interventions ADLs/Self Care Home Management;Traction;Patient/family education;Manual techniques;Therapeutic exercise;Therapeutic activities;Functional mobility training;Stair training   PT Next Visit Plan continue with stabilization exercises including clam, bridging, and bent knee raise, begin sciatic nerve flossing. Education in Software engineer and posture.    Consulted and Agree with Plan of Care Patient      Patient will benefit from skilled therapeutic intervention in order to improve the following deficits and impairments:  Decreased activity tolerance, Decreased balance, Decreased strength, Difficulty walking, Obesity, Improper body mechanics, Pain  Visit Diagnosis: Radiculopathy, lumbar region - Plan: PT plan of care cert/re-cert  Muscle weakness (generalized) - Plan: PT plan of care cert/re-cert  Pain in right leg - Plan: PT plan of care cert/re-cert     Problem List Patient Active Problem List   Diagnosis Date Noted  . 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 Numbness and tingling in left hand 08/19/2016  . Nausea 08/19/2016  . Back pain with right-sided sciatica 06/18/2016  . Menorrhagia with regular cycle 04/10/2016  . Dysmenorrhea 04/10/2016  . Body mass index 36.0-36.9, adult 04/10/2016  .  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 Muscle spasm of back 03/13/2016  . Obesity 03/13/2016  . Body mass index 37.0-37.9, adult 03/13/2016  . Weight loss counseling, encounter for 03/13/2016  . Stress 02/14/2016  . Perianal wart 02/10/2015  . Hemorrhoids 02/10/2015  . Tobacco use disorder 07/10/2013  . GERD (gastroesophageal reflux disease) 07/10/2013  . Asthma exacerbation 07/08/2013  . HTN (hypertension) 07/08/2013    Virgina Organ, PT CLT [phone] 09/10/2016, 4:40 PM  [facility] [address] [place], [place], [address] Phone: [phone]   Fax:  [phone]  Name: Landen Breeland MRN: [id number] Date of Birth: 1981/11/13

## 2016-09-10 NOTE — Patient Instructions (Addendum)
Heel Squeeze (Prone)    Abdomen supported, bend knees and gently squeeze heels together. Hold _5___ seconds. Repeat ___5-10_ times per set. Do __1__ sets per session. Do _2___ sessions per day.  http://orth.exer.us/1080   Copyright  VHI. All rights reserved.  Gluteal Sets    Tighten buttocks while pressing pelvis to floor. Hold _5__ seconds. Repeat ___5_ times per set. Do __1__ sets per session. Do ____ sessions per day. 2 http://orth.exer.us/104   Copyright  VHI. All rights reserved.  Hip Extension (Prone)    Lift left leg _2___ inches from floor, keeping knee locked. Repeat _10___ times per set. Do __1__ sets per session. Do __2__ sessions per day.  http://orth.exer.us/98   Copyright  VHI. All rights reserved.

## 2016-09-12 ENCOUNTER — Ambulatory Visit (HOSPITAL_COMMUNITY): Payer: BLUE CROSS/BLUE SHIELD

## 2016-09-12 ENCOUNTER — Telehealth (HOSPITAL_COMMUNITY): Payer: Self-pay

## 2016-09-12 NOTE — Telephone Encounter (Signed)
09/12/16 she called to cx the 10/9 appt... Said she couldn't come in twice that week

## 2016-09-16 ENCOUNTER — Ambulatory Visit (INDEPENDENT_AMBULATORY_CARE_PROVIDER_SITE_OTHER): Payer: BLUE CROSS/BLUE SHIELD | Admitting: Adult Health

## 2016-09-16 ENCOUNTER — Encounter: Payer: Self-pay | Admitting: Adult Health

## 2016-09-16 ENCOUNTER — Ambulatory Visit (HOSPITAL_COMMUNITY): Payer: BLUE CROSS/BLUE SHIELD | Admitting: Physical Therapy

## 2016-09-16 VITALS — BP 140/80 | HR 86 | Ht 62.0 in | Wt 194.5 lb

## 2016-09-16 DIAGNOSIS — F329 Major depressive disorder, single episode, unspecified: Secondary | ICD-10-CM

## 2016-09-16 DIAGNOSIS — F32A Depression, unspecified: Secondary | ICD-10-CM

## 2016-09-16 DIAGNOSIS — R200 Anesthesia of skin: Secondary | ICD-10-CM | POA: Diagnosis not present

## 2016-09-16 DIAGNOSIS — M5431 Sciatica, right side: Secondary | ICD-10-CM

## 2016-09-16 DIAGNOSIS — R202 Paresthesia of skin: Secondary | ICD-10-CM | POA: Diagnosis not present

## 2016-09-16 DIAGNOSIS — Z713 Dietary counseling and surveillance: Secondary | ICD-10-CM | POA: Diagnosis not present

## 2016-09-16 MED ORDER — BUPROPION HCL ER (SR) 150 MG PO TB12: 150.0000 mg | ORAL_TABLET | Freq: Two times a day (BID) | ORAL | 3 refills | Status: DC

## 2016-09-16 NOTE — Patient Instructions (Signed)
Increase wellbutrin to bid Follow up in 8 weeks Follow up with Dr Hilda LiasKeeling Call the vein doctor back

## 2016-09-16 NOTE — Progress Notes (Signed)
Subjective:     Patient ID: [facility]iny Petty, female   DOB: Feb 07, 1981, 35 y.o.   MRN: [id number]  HPI Destiny Petty is a 35 year old white female in for weight and BP and ROS.She has gained 4.5 lbs, but says she can't exercise due to pain in back and legs and left hand tingles.  Review of Systems +pain in back and right leg Left hand tingles Reviewed past medical,surgical, social and family history. Reviewed medications and allergies.     Objective:   Physical Exam BP 140/80 (BP Location: Left Arm, Patient Position: Sitting, Cuff Size: Normal)   Pulse 86   Ht 5\' 2"  (1.575 m)   Wt 194 lb 8 oz (88.2 kg)   LMP 08/25/2016 (Exact Date)   BMI 35.57 kg/m  Skin warm and dry. Neck: mid line trachea, normal thyroid, good ROM, no lymphadenopathy noted. Lungs: clear to ausculation bilaterally. Cardiovascular: regular rate and rhythm.   PHQ 9 score 11, she says she is a little better but thinks Wellbutrin should be increased.Will stop adipex for now.  Assessment:     1. Back pain with right-sided sciatica   2. Depression, unspecified depression type   3. Weight loss counseling, encounter for   4. Numbness and tingling in left hand       Plan:   Stop adipex Rx Wellbutrin 150 mg SR #60 take 1 bid with 3 refills  Continue weight loss efforts, like cutting carbs and calories  Follow up in 8 weeks regarding increasing Wellbutrin  Follow up with Dr Hilda LiasKeeling about back pain, and can't afford PT, and tell him about left hand  Call the vein doctor back about legs hurting

## 2016-09-19 ENCOUNTER — Telehealth (HOSPITAL_COMMUNITY): Payer: Self-pay

## 2016-09-19 NOTE — Telephone Encounter (Signed)
09/19/16 cx - there is a funeral that she is going to

## 2016-09-20 ENCOUNTER — Ambulatory Visit (HOSPITAL_COMMUNITY): Payer: BLUE CROSS/BLUE SHIELD

## 2016-09-24 ENCOUNTER — Ambulatory Visit (INDEPENDENT_AMBULATORY_CARE_PROVIDER_SITE_OTHER): Payer: Self-pay | Admitting: Orthopaedic Surgery

## 2016-09-24 ENCOUNTER — Ambulatory Visit (HOSPITAL_COMMUNITY): Payer: BLUE CROSS/BLUE SHIELD | Admitting: Physical Therapy

## 2016-09-24 ENCOUNTER — Encounter: Payer: Self-pay | Admitting: Orthopaedic Surgery

## 2016-09-24 VITALS — BP 129/89 | HR 90 | Ht 62.0 in | Wt 188.0 lb

## 2016-09-24 DIAGNOSIS — M545 Low back pain: Secondary | ICD-10-CM

## 2016-09-24 DIAGNOSIS — M79604 Pain in right leg: Secondary | ICD-10-CM

## 2016-09-24 DIAGNOSIS — M5416 Radiculopathy, lumbar region: Secondary | ICD-10-CM

## 2016-09-24 DIAGNOSIS — M6281 Muscle weakness (generalized): Secondary | ICD-10-CM

## 2016-09-24 DIAGNOSIS — G5602 Carpal tunnel syndrome, left upper limb: Secondary | ICD-10-CM

## 2016-09-24 DIAGNOSIS — F17200 Nicotine dependence, unspecified, uncomplicated: Secondary | ICD-10-CM

## 2016-09-24 DIAGNOSIS — G8929 Other chronic pain: Secondary | ICD-10-CM

## 2016-09-24 MED ORDER — HYDROCODONE-ACETAMINOPHEN 5-325 MG PO TABS: 1.0000 | ORAL_TABLET | Freq: Four times a day (QID) | ORAL | 0 refills | Status: DC | PRN

## 2016-09-24 NOTE — Therapy (Signed)
[facility] [address] [place], [place], [address] Phone: [phone]   Fax:  [phone]  Physical Therapy Treatment  Patient Details  Name: Destiny Petty MRN: [id number] Date of Birth: Apr 04, 1981 Referring Provider: Darreld Mclean  Encounter Date: 09/24/2016      PT End of Session - 09/24/16 0948    Visit Number 2   Number of Visits 8   Date for PT Re-Evaluation 10/10/16   Authorization Type BCBS   Authorization - Visit Number 2   Authorization - Number of Visits 8   PT Start Time 0825   PT Stop Time 0905   PT Time Calculation (min) 40 min   Activity Tolerance Patient tolerated treatment well   Behavior During Therapy [facility] for tasks assessed/performed      Past Medical History:  Diagnosis Date  . Anxiety   . Asthma   . Back pain with right-sided sciatica 06/18/2016  . Body mass index 36.0-36.9, adult 04/10/2016  . Body mass index 37.0-37.9, adult 03/13/2016  . Depression   . Dysmenorrhea 04/10/2016  . Hemorrhoids 02/10/2015  . Hiatal hernia   . Hypertension   . Menorrhagia with regular cycle 04/10/2016  . Muscle spasm of back 03/13/2016  . Obesity   . Perianal wart 02/10/2015  . Stress 02/14/2016  . Weight loss counseling, encounter for 03/13/2016    Past Surgical History:  Procedure Laterality Date  . CESAREAN SECTION    . CHOLECYSTECTOMY    . COLONOSCOPY    . ESOPHAGOGASTRODUODENOSCOPY ENDOSCOPY    . TUBAL LIGATION      There were no vitals filed for this visit.      Subjective Assessment - 09/24/16 0831    Subjective Pt states she is having central LBP.  States she believes the knot on her back may be related to her menstrual cycle as it swells more when she is fixing to start her monthly.  Pt states she is concerned about this and her radiating pain down her Rt LE that may also be vein issues.  Had an MRI on both her her LE's last week and is waiting on results.  Currently 7/10   Currently in Pain? Yes   Pain  Score 7    Pain Location Back   Pain Orientation Mid;Lower;Right   Pain Descriptors / Indicators Aching;Throbbing;Burning   Pain Type Chronic pain                         [facility] Adult PT Treatment/Exercise - 09/24/16 0001      Lumbar Exercises: Stretches   Single Knee to Chest Stretch 2 reps;30 seconds   Piriformis Stretch 2 reps;30 seconds   Piriformis Stretch Limitations seated     Lumbar Exercises: Supine   Ab Set 5 reps   AB Set Limitations each direction   Clam 10 reps   Bent Knee Raise 10 reps   Bridge 10 reps   Other Supine Lumbar Exercises sciatic flossing Rt LE                PT Education - 09/24/16 1016    Education provided Yes   Education Details given copy of intial evaluation, reviewed goals and updated HEP   Person(s) Educated Patient   Methods Explanation;Demonstration;Tactile cues;Verbal cues;Handout   Comprehension Verbalized understanding;Returned demonstration;Verbal cues required;Tactile cues required;Need further instruction          PT Short Term Goals - 09/10/16 6962  PT SHORT TERM GOAL #1   Title Pt radicular pain to be no greater than a 5/10 to allow pt to sleep for 4 hours a night.    Time 2   Period Weeks   Status New     PT SHORT TERM GOAL #2   Title Pt LE and core strength to be increased by 1/2 grade to allow pt to be able to come sit to stand with ease    Time 2   Period Weeks   Status New     PT SHORT TERM GOAL #3   Title Pt to be able to complete a 15 minute shopping trip with no increase back or leg pain    Time 2   Period Weeks           PT Long Term Goals - 09/10/16 1634      PT LONG TERM GOAL #1   Title Pt back and leg  pain to be decreased to no greater than a 3/10 to allow pt to drive to [place] in comfort    Time 4   Period Weeks   Status New     PT LONG TERM GOAL #2   Title Pt core and LE strength increased by one grade to allow pt to go up and down steps without difficulty.     Time 4   Period Weeks   Status New     PT LONG TERM GOAL #3   Title Pt to be walking 30 minutes a day for continued maintance of self back care.    Time 4   Period Weeks   Status New     PT LONG TERM GOAL #4   Title Pt to demonstrate proper sitting posture including the use of a towel roll for decreased stress on low back during traveling and work duties    Time 4   Period Weeks               Plan - 09/24/16 0948    Clinical Impression Statement Reviewed HEP and initial evaluation goals.  Given a copy of initial evaluation and updated HEP. Good results with sciatic flossing and addition of piriformis stretch.  Pt requires cues to complete therex more slowly and controlled.  Cues with core stability also.     Rehab Potential Good   PT Frequency 2x / week   PT Duration 4 weeks   PT Treatment/Interventions ADLs/Self Care Home Management;Traction;Patient/family education;Manual techniques;Therapeutic exercise;Therapeutic activities;Functional mobility training;Stair training   PT Next Visit Plan continue with core stabilization and stretching exercises.     PT Home Exercise Plan updated 10/17:  bridge, clams, hamstring stretch, piriformis stretch   Consulted and Agree with Plan of Care Patient      Patient will benefit from skilled therapeutic intervention in order to improve the following deficits and impairments:  Decreased activity tolerance, Decreased balance, Decreased strength, Difficulty walking, Obesity, Improper body mechanics, Pain  Visit Diagnosis: Radiculopathy, lumbar region  Muscle weakness (generalized)  Pain in right leg     Problem List Patient Active Problem List   Diagnosis Date Noted  . 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 Numbness and tingling in left hand 08/19/2016  . Nausea 08/19/2016  . Back pain with right-sided sciatica 06/18/2016  . Menorrhagia with regular cycle 04/10/2016  . Dysmenorrhea 04/10/2016  . Body mass  index 36.0-36.9, adult 04/10/2016  . 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 Muscle spasm of back 03/13/2016  . Obesity  03/13/2016  . Body mass index 37.0-37.9, adult 03/13/2016  . Weight loss counseling, encounter for 03/13/2016  . Stress 02/14/2016  . Perianal wart 02/10/2015  . Hemorrhoids 02/10/2015  . Tobacco use disorder 07/10/2013  . GERD (gastroesophageal reflux disease) 07/10/2013  . Asthma exacerbation 07/08/2013  . HTN (hypertension) 07/08/2013    Lurena Nidamy B Ivorie Uplinger, PTA/CLT [phone]  09/24/2016, 10:18 AM  [facility] [address] [place], [place], [address] Phone: [phone]   Fax:  [phone]  Name: Destiny Petty MRN: [id number] Date of Birth: 01/10/1981

## 2016-09-24 NOTE — Patient Instructions (Signed)
Piriformis Stretch, Sitting    Sit, one ankle on opposite knee, same-side hand on crossed knee. Push down on knee, keeping spine straight. Lean torso forward, with flat back, until tension is felt in hamstrings and gluteals of crossed-leg side. Hold _30__ seconds. Repeat _3__ times per session. Do _2__ sessions per day.   Bridge    Lie back, legs bent. Inhale, pressing hips up. Keeping ribs in, lengthen lower back. Exhale, rolling down along spine from top.Repeat _10_ times. Do _2___ sessions per day.    Hip Abduction / Adduction: with Knee Flexion (Supine)    With right knee bent, gently lower knee to side and return. Repeat _10__ times per set.  Do __2__ sessions per day.    Hamstring Stretch    With other leg bent, foot flat, grasp right leg and slowly try to straighten knee. Hold __30__ seconds.Repeat _2-3___ times. Do _2___ sessions per day. **Can take a towel and pull foot down to get deeper stretch into nerve**

## 2016-09-24 NOTE — Progress Notes (Signed)
Patient [name]:Destiny Petty, female DOB:[birth year], 35 y.o. VWU:[id number]  Chief Complaint  Patient presents with  . Follow-up    Low back pain    HPI  Destiny Petty is a 35 y.o. female who has chronic lower back pain.  She is going to PT and is making good progress.  She has less pain.  She has been doing her exercises at home.  She has developed pain in the left hand with nocturnal pain and numbness in median nerve distribution that is getting worse.  I will get EMGs.  She continues to smoke and is at the present unwilling to quit but is willing to try to cut back.  HPI  Body mass index is 34.39 kg/m.  ROS  Review of Systems  HENT: Positive for congestion.   Respiratory: Positive for shortness of breath. Negative for cough.   Cardiovascular: Negative for chest pain and leg swelling.  Endocrine: Negative for cold intolerance.  Musculoskeletal: Positive for arthralgias and back pain.  Allergic/Immunologic: Negative for environmental allergies.    Past Medical History:  Diagnosis Date  . Anxiety   . Asthma   . Back pain with right-sided sciatica 06/18/2016  . Body mass index 36.0-36.9, adult 04/10/2016  . Body mass index 37.0-37.9, adult 03/13/2016  . Depression   . Dysmenorrhea 04/10/2016  . Hemorrhoids 02/10/2015  . Hiatal hernia   . Hypertension   . Menorrhagia with regular cycle 04/10/2016  . Muscle spasm of back 03/13/2016  . Obesity   . Perianal wart 02/10/2015  . Stress 02/14/2016  . Weight loss counseling, encounter for 03/13/2016    Past Surgical History:  Procedure Laterality Date  . CESAREAN SECTION    . CHOLECYSTECTOMY    . COLONOSCOPY    . ESOPHAGOGASTRODUODENOSCOPY ENDOSCOPY    . TUBAL LIGATION      Family History  Problem Relation Age of Onset  . Diabetes Mother   . Kidney failure Mother   . Depression Sister   . Hypertension Sister   . Anxiety disorder Sister   . Other Sister     back problems  . ADD / ADHD Daughter   . Other Daughter      behavioral issues  . ODD Daughter   . Asthma Son   . Cancer Maternal Grandmother   . Seizures Maternal Grandmother   . Lupus Other     Social History Social History  Substance Use Topics  . Smoking status: Current Every Day Smoker    Packs/day: 1.00    Years: 20.00    Types: Cigarettes  . Smokeless tobacco: Never Used  . Alcohol use Yes     Comment: occ    Allergies  Allergen Reactions  . Latex Itching  . Lisinopril Other (See Comments)    coughing    Current Outpatient Prescriptions  Medication Sig Dispense Refill  . ALBUTEROL IN Inhale into the lungs as needed.    [name]. buPROPion (WELLBUTRIN SR) 150 MG 12 hr tablet Take 1 tablet (150 mg total) by mouth 2 (two) times daily. 60 tablet 3  . cyclobenzaprine (FLEXERIL) 10 MG tablet Take 1 tablet (10 mg total) by mouth 3 (three) times daily as needed. for muscle spams 30 tablet 1  . fluticasone (FLONASE) 50 MCG/ACT nasal spray Place 2 sprays into both nostrils daily.     . hydrochlorothiazide (MICROZIDE) 12.5 MG capsule Take 1 capsule (12.5 mg total) by mouth daily. 30 capsule 11  . HYDROcodone-acetaminophen (NORCO/VICODIN) 5-325 MG tablet Take  1 tablet by mouth every 6 (six) hours as needed for moderate pain (Must last 14 days.Do not take and drive a car or use machinery.). 50 tablet 0  . naproxen (NAPROSYN) 500 MG tablet Take 1 tablet (500 mg total) by mouth 2 (two) times daily with a meal. 60 tablet 5  . promethazine (PHENERGAN) 25 MG tablet Take 1 tablet (25 mg total) by mouth every 6 (six) hours as needed for nausea or vomiting. 30 tablet 1   No current facility-administered medications for this visit.      Physical Exam  Blood pressure 129/89, pulse 90, height 5\' 2"  (1.575 m), weight 188 lb (85.3 kg), last menstrual period 08/25/2016.  Constitutional: overall normal hygiene, normal nutrition, well developed, normal grooming, normal body habitus. Assistive device:none  Musculoskeletal: gait and station Limp none,  muscle tone and strength are normal, no tremors or atrophy is present.  .  Neurological: coordination overall normal.  Deep tendon reflex/nerve stretch intact.  Sensation normal.  Cranial nerves II-XII intact.   Skin:   Normal overall no scars, lesions, ulcers or rashes. No psoriasis.  Psychiatric: Alert and oriented x 3.  Recent memory intact, remote memory unclear.  Normal mood and affect. Well groomed.  Good eye contact.  Cardiovascular: overall no swelling, no varicosities, no edema bilaterally, normal temperatures of the legs and arms, no clubbing, cyanosis and good capillary refill.  Lymphatic: palpation is normal.  Spine/Pelvis examination:  Inspection:  Overall, sacoiliac joint benign and hips nontender; without crepitus or defects.   Thoracic spine inspection: Alignment normal without kyphosis present   Lumbar spine inspection:  Alignment  with normal lumbar lordosis, without scoliosis apparent.   Thoracic spine palpation:  without tenderness of spinal processes   Lumbar spine palpation: with tenderness of lumbar area; without tightness of lumbar muscles    Range of Motion:   Lumbar flexion, forward flexion is 45 without pain or tenderness    Lumbar extension is 10 without pain or tenderness   Left lateral bend is Normal  without pain or tenderness   Right lateral bend is Normal without pain or tenderness   Straight leg raising is Normal   Strength & tone: Normal   Stability overall normal stability   She has pain in the left hand with positive Tinel and phalen on the left.  She has decreased sensation in median nerve. Grip is OK, ROM is full.  The patient has been educated about the nature of the problem(s) and counseled on treatment options.  The patient appeared to understand what I have discussed and is in agreement with it.  Encounter Diagnoses  Name Primary?  . Chronic midline low back pain without sciatica Yes  . Nicotine use disorder   . Carpal tunnel  syndrome on left     PLAN Call if any problems.  Precautions discussed.  Continue current medications.   Return to clinic after EMG of hands, continue PT for back   Electronically Signed Darreld Mclean, MD 10/17/201710:22 AM

## 2016-09-27 ENCOUNTER — Ambulatory Visit (HOSPITAL_COMMUNITY): Payer: BLUE CROSS/BLUE SHIELD

## 2016-10-03 ENCOUNTER — Telehealth (HOSPITAL_COMMUNITY): Payer: Self-pay

## 2016-10-03 NOTE — Telephone Encounter (Signed)
Patient's daughter was put in the hospital.

## 2016-10-04 ENCOUNTER — Ambulatory Visit (HOSPITAL_COMMUNITY): Payer: BLUE CROSS/BLUE SHIELD | Admitting: Physical Therapy

## 2016-10-07 ENCOUNTER — Ambulatory Visit (HOSPITAL_COMMUNITY): Payer: BLUE CROSS/BLUE SHIELD | Admitting: Physical Therapy

## 2016-10-07 DIAGNOSIS — M5416 Radiculopathy, lumbar region: Secondary | ICD-10-CM

## 2016-10-07 DIAGNOSIS — M6281 Muscle weakness (generalized): Secondary | ICD-10-CM

## 2016-10-07 DIAGNOSIS — M79604 Pain in right leg: Secondary | ICD-10-CM

## 2016-10-07 NOTE — Therapy (Addendum)
[place], [place], [address] Phone: [phone]   Fax:  [phone]  Physical Therapy Treatment  Patient Details  Name: Destiny Petty MRN: [id number] Date of Birth: September 27, 1981 Referring Provider: Sanjuana Kava  Encounter Date: 10/07/2016      PT End of Session - 10/07/16 1605    Visit Number 3   Number of Visits 8   Date for PT Re-Evaluation 10/10/16   Authorization Type [employer]   Authorization - Visit Number 3   Authorization - Number of Visits 8   PT Start Time 1522   PT Stop Time 1603   PT Time Calculation (min) 41 min   Activity Tolerance Patient tolerated treatment well   Behavior During Therapy [facility] for tasks assessed/performed      Past Medical History:  Diagnosis Date  . Anxiety   . Asthma   . Back pain with right-sided sciatica 06/18/2016  . Body mass index 36.0-36.9, adult 04/10/2016  . Body mass index 37.0-37.9, adult 03/13/2016  . Depression   . Dysmenorrhea 04/10/2016  . Hemorrhoids 02/10/2015  . Hiatal hernia   . Hypertension   . Menorrhagia with regular cycle 04/10/2016  . Muscle spasm of back 03/13/2016  . Obesity   . Perianal wart 02/10/2015  . Stress 02/14/2016  . Weight loss counseling, encounter for 03/13/2016    Past Surgical History:  Procedure Laterality Date  . CESAREAN SECTION    . CHOLECYSTECTOMY    . COLONOSCOPY    . ESOPHAGOGASTRODUODENOSCOPY ENDOSCOPY    . TUBAL LIGATION      There were no vitals filed for this visit.      Subjective Assessment - 10/07/16 1523    Subjective Pt has no questions on the exercises.  Her pain is better but increases after being on her feet for two to three hours. Pt states she has increased leg pain after she has been sittiing for a few hours.     Pertinent History HTN, asthma ,    Limitations Sitting;Reading;Lifting;Standing;Walking   How long can you sit comfortably? Able sit 30 minutes    How long can you stand comfortably? 20 minutes     How long can you walk comfortably? an hour    Patient Stated Goals less pain, to be able to walk better, to ride in the car, go up and down steps    Currently in Pain? Yes   Pain Score 8    Pain Location Back   Pain Orientation Right;Left;Lower   Pain Descriptors / Indicators Discomfort   Pain Onset More than a month ago   Pain Frequency Constant                         [facility] Adult PT Treatment/Exercise - 10/07/16 0001      Lumbar Exercises: Stretches   Passive Hamstring Stretch 2 reps;30 seconds   Single Knee to Chest Stretch 2 reps;30 seconds   Piriformis Stretch Limitations child pose 30" x 3      Lumbar Exercises: Standing   Heel Raises 10 reps   Functional Squats 10 reps   Other Standing Lumbar Exercises wall push up x10     Lumbar Exercises: Seated   Sit to Stand 10 reps     Lumbar Exercises: Supine   Bridge 15 reps   Other Supine Lumbar Exercises sciatic flossing Rt LE   Other Supine Lumbar Exercises dead bug x 10  Lumbar Exercises: Prone   Straight Leg Raise 10 reps   Opposite Arm/Leg Raise Right arm/Left leg;Left arm/Right leg;10 reps   Other Prone Lumbar Exercises double arm raise x 10      Lumbar Exercises: Quadruped   [place] 5 reps                  PT Short Term Goals - 09/10/16 1632      PT SHORT TERM GOAL #1   Title Pt radicular pain to be no greater than a 5/10 to allow pt to sleep for 4 hours a night.    Time 2   Period Weeks   Status New     PT SHORT TERM GOAL #2   Title Pt LE and core strength to be increased by 1/2 grade to allow pt to be able to come sit to stand with ease    Time 2   Period Weeks   Status New     PT SHORT TERM GOAL #3   Title Pt to be able to complete a 15 minute shopping trip with no increase back or leg pain    Time 2   Period Weeks           PT Long Term Goals - 09/10/16 1634      PT LONG TERM GOAL #1   Title Pt back and leg  pain to be decreased to no greater than a  3/10 to allow pt to drive to [place] in comfort    Time 4   Period Weeks   Status New     PT LONG TERM GOAL #2   Title Pt core and LE strength increased by one grade to allow pt to go up and down steps without difficulty.    Time 4   Period Weeks   Status New     PT LONG TERM GOAL #3   Title Pt to be walking 30 minutes a day for continued maintance of self back care.    Time 4   Period Weeks   Status New     PT LONG TERM GOAL #4   Title Pt to demonstrate proper sitting posture including the use of a towel roll for decreased stress on low back during traveling and work duties    Time 4   Period Weeks               Plan - 10/07/16 1606    Clinical Impression Statement Added standing stabilization to improve core strength.  Pt needs verbal cuing to maintain stability of lumbar region.  Pt complains of Lt sided L4-S1 pain but no spasms noted with palpation.    Rehab Potential Good   PT Frequency 2x / week   PT Duration 4 weeks   PT Treatment/Interventions ADLs/Self Care Home Management;Traction;Patient/family education;Manual techniques;Therapeutic exercise;Therapeutic activities;Functional mobility training;Stair training   PT Next Visit Plan continue with core stabilization and stretching exercises.     PT Home Exercise Plan updated 10/17:  bridge, clams, hamstring stretch, piriformis stretch   Consulted and Agree with Plan of Care Patient      Patient will benefit from skilled therapeutic intervention in order to improve the following deficits and impairments:  Decreased activity tolerance, Decreased balance, Decreased strength, Difficulty walking, Obesity, Improper body mechanics, Pain  Visit Diagnosis: Radiculopathy, lumbar region  Muscle weakness (generalized)  Pain in right leg     Problem List Patient Active Problem List   Diagnosis Date Noted  .  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 Numbness and tingling in left hand 08/19/2016  .  Nausea 08/19/2016  . Back pain with right-sided sciatica 06/18/2016  . Menorrhagia with regular cycle 04/10/2016  . Dysmenorrhea 04/10/2016  . Body mass index 36.0-36.9, adult 04/10/2016  . 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 Muscle spasm of back 03/13/2016  . Obesity 03/13/2016  . Body mass index 37.0-37.9, adult 03/13/2016  . Weight loss counseling, encounter for 03/13/2016  . Stress 02/14/2016  . Perianal wart 02/10/2015  . Hemorrhoids 02/10/2015  . Tobacco use disorder 07/10/2013  . GERD (gastroesophageal reflux disease) 07/10/2013  . Asthma exacerbation 07/08/2013  . HTN (hypertension) 07/08/2013   Rayetta Humphrey, PT CLT [phone] 10/07/2016, 4:09 PM  [facility] [address] [place], [place], [address] Phone: [phone]   Fax:  [phone]  Name: Destiny Petty MRN: [id number] Date of Birth: 27-Apr-1981  07/21/2017  PHYSICAL THERAPY DISCHARGE SUMMARY  Visits from Start of Care: 3 Current functional level related to goals / functional outcomes: No change pt did not return    Remaining deficits:   No change pt did not return  Education / Equipment: HEP Plan: Patient agrees to discharge.  Patient goals were not met. Patient is being discharged due to not returning since the last visit.  ?????       Rayetta Humphrey, [place] CLT [phone]

## 2016-10-11 ENCOUNTER — Ambulatory Visit (HOSPITAL_COMMUNITY): Payer: BLUE CROSS/BLUE SHIELD

## 2016-10-11 ENCOUNTER — Telehealth (HOSPITAL_COMMUNITY): Payer: Self-pay | Admitting: *Deleted

## 2016-10-11 NOTE — Telephone Encounter (Signed)
10/11/16 pt called to cx but just said she couldn't come in today, no specific reason was given

## 2016-10-14 ENCOUNTER — Telehealth (HOSPITAL_COMMUNITY): Payer: Self-pay | Admitting: *Deleted

## 2016-10-14 ENCOUNTER — Ambulatory Visit (HOSPITAL_COMMUNITY): Payer: BLUE CROSS/BLUE SHIELD | Admitting: Physical Therapy

## 2016-10-14 NOTE — Telephone Encounter (Signed)
10/14/16 pt left a messge that she was cx because she wasn't feeling well today

## 2016-10-16 ENCOUNTER — Encounter: Payer: Self-pay | Admitting: Radiology

## 2016-10-16 ENCOUNTER — Telehealth: Payer: Self-pay | Admitting: Radiology

## 2016-10-16 NOTE — Progress Notes (Unsigned)
Patient does not have insurance coverage at this time.  She will let us know when she does and we can schedule it.

## 2016-10-16 NOTE — Telephone Encounter (Signed)
error 

## 2016-10-18 ENCOUNTER — Telehealth: Payer: Self-pay | Admitting: Orthopaedic Surgery

## 2016-10-28 NOTE — Telephone Encounter (Signed)
Routing to dr Hilda Liaskeeling

## 2016-10-29 MED ORDER — HYDROCODONE-ACETAMINOPHEN 5-325 MG PO TABS: 1.0000 | ORAL_TABLET | Freq: Four times a day (QID) | ORAL | 0 refills | Status: DC | PRN

## 2016-10-29 MED ORDER — CYCLOBENZAPRINE HCL 10 MG PO TABS: 10.0000 mg | ORAL_TABLET | Freq: Every day | ORAL | 0 refills | Status: DC

## 2016-11-05 ENCOUNTER — Telehealth: Payer: Self-pay

## 2016-11-05 NOTE — Telephone Encounter (Signed)
Left message for pt to call back to make new to est appt with dr Delton Seenelson.

## 2016-11-07 ENCOUNTER — Emergency Department (HOSPITAL_COMMUNITY)
Admission: EM | Admit: 2016-11-07 | Discharge: 2016-11-08 | Disposition: A | Discharge status: Home / Self Care | Payer: BLUE CROSS/BLUE SHIELD | Attending: Emergency Medicine | Admitting: Emergency Medicine

## 2016-11-07 ENCOUNTER — Encounter (HOSPITAL_COMMUNITY): Payer: Self-pay | Admitting: *Deleted

## 2016-11-07 DIAGNOSIS — F17210 Nicotine dependence, cigarettes, uncomplicated: Secondary | ICD-10-CM | POA: Insufficient documentation

## 2016-11-07 DIAGNOSIS — Z791 Long term (current) use of non-steroidal anti-inflammatories (NSAID): Secondary | ICD-10-CM | POA: Diagnosis not present

## 2016-11-07 DIAGNOSIS — R05 Cough: Secondary | ICD-10-CM | POA: Diagnosis present

## 2016-11-07 DIAGNOSIS — E860 Dehydration: Secondary | ICD-10-CM

## 2016-11-07 DIAGNOSIS — I10 Essential (primary) hypertension: Secondary | ICD-10-CM | POA: Insufficient documentation

## 2016-11-07 DIAGNOSIS — Z79899 Other long term (current) drug therapy: Secondary | ICD-10-CM | POA: Insufficient documentation

## 2016-11-07 DIAGNOSIS — J45901 Unspecified asthma with (acute) exacerbation: Secondary | ICD-10-CM

## 2016-11-07 DIAGNOSIS — R110 Nausea: Secondary | ICD-10-CM

## 2016-11-07 DIAGNOSIS — J40 Bronchitis, not specified as acute or chronic: Secondary | ICD-10-CM

## 2016-11-07 LAB — CBC WITH DIFFERENTIAL/PLATELET
BASOS ABS: 0.1 10*3/uL (ref 0.0–0.1)
BASOS PCT: 1 %
EOS ABS: 0.3 10*3/uL (ref 0.0–0.7)
EOS PCT: 3 %
HCT: 43.8 % (ref 36.0–46.0)
Hemoglobin: 14.6 g/dL (ref 12.0–15.0)
Lymphocytes Relative: 45 %
Lymphs Abs: 4.6 10*3/uL — ABNORMAL HIGH (ref 0.7–4.0)
MCH: 32.2 pg (ref 26.0–34.0)
MCHC: 33.3 g/dL (ref 30.0–36.0)
MCV: 96.7 fL (ref 78.0–100.0)
MONO ABS: 0.6 10*3/uL (ref 0.1–1.0)
Monocytes Relative: 6 %
NEUTROS ABS: 4.6 10*3/uL (ref 1.7–7.7)
Neutrophils Relative %: 45 %
PLATELETS: 247 10*3/uL (ref 150–400)
RBC: 4.53 MIL/uL (ref 3.87–5.11)
RDW: 12.5 % (ref 11.5–15.5)
WBC: 10.2 10*3/uL (ref 4.0–10.5)

## 2016-11-07 LAB — URINALYSIS, ROUTINE W REFLEX MICROSCOPIC
BILIRUBIN URINE: NEGATIVE
Glucose, UA: NEGATIVE mg/dL
Hgb urine dipstick: NEGATIVE
Ketones, ur: NEGATIVE mg/dL
Leukocytes, UA: NEGATIVE
NITRITE: NEGATIVE
PROTEIN: NEGATIVE mg/dL
Specific Gravity, Urine: 1.02 (ref 1.005–1.030)
pH: 6 (ref 5.0–8.0)

## 2016-11-07 LAB — COMPREHENSIVE METABOLIC PANEL
ALBUMIN: 4.4 g/dL (ref 3.5–5.0)
ALT: 14 U/L (ref 14–54)
ANION GAP: 7 (ref 5–15)
AST: 19 U/L (ref 15–41)
Alkaline Phosphatase: 68 U/L (ref 38–126)
BUN: 16 mg/dL (ref 6–20)
CHLORIDE: 103 mmol/L (ref 101–111)
CO2: 24 mmol/L (ref 22–32)
Calcium: 9.2 mg/dL (ref 8.9–10.3)
Creatinine, Ser: 0.66 mg/dL (ref 0.44–1.00)
GFR calc Af Amer: >60 mL/min (ref >60)
GFR calc non Af Amer: >60 mL/min (ref >60)
GLUCOSE: 87 mg/dL (ref 65–99)
POTASSIUM: 3.7 mmol/L (ref 3.5–5.1)
Sodium: 134 mmol/L — ABNORMAL LOW (ref 135–145)
Total Bilirubin: 0.3 mg/dL (ref 0.3–1.2)
Total Protein: 7.2 g/dL (ref 6.5–8.1)

## 2016-11-07 NOTE — ED Provider Notes (Signed)
[facility]Y DEPT Provider Note   CSN: [id number] Arrival date & time: 11/07/16  2036  By signing my name below, I, Majel HomerPeyton Lee, attest that this documentation has been prepared under the direction and in the presence of Devoria AlbeIva Brylie Sneath, MD . Electronically Signed: Majel HomerPeyton Lee, Scribe. 11/08/2016. 12:29 AM.  Time seen 12:05 AM  History   Chief Complaint Chief Complaint  Patient presents with  . Weakness   The history is provided by the patient. No language interpreter was used.   HPI Comments: Destiny Petty is a [age] y.o. female with PMhx of asthma, who presents to the Emergency Department complaining of gradually worsening, cough productive of green sputum and nausea that began 4 days ago before she started a zpak. Pt reports she has not been drinking fluids or eating food due to her nausea. She states associated diarrhea that is mild and only once today, fever (TMAX 102.1 this evening), wheezing, and intermittent chills that last for ~2 hours. Pt reports she also experienced similar symptoms of fever, diaphoresis, chills and generalized body aches 3 weeks ago and thought she had the flu but states her symptoms "improved until they worsened 4 days ago." She notes she had her relatives over for Thanksgiving who were also experiencing symptoms of cough and rhinorrhea. She states she did not receive her influenza vaccination this year. Pt reports she visited Urgent Care a few days ago for her symptoms in which she was diagnosed with bronchitis and prescribed a Zpak; she notes she last  took her medication at ~6:30 AM this morning and has her last dose will be in the morning. She states she has also used her inhaler for her symptoms with mild relief; however, they are only relieved for ~1 hour. Pt reports her asthma is usually exacerbated "in the fall during seasonal changes." She states she has been given steroids for her asthma in the past but has never needed hospitalization. Pt denies abdominal  pain and vomiting. She states she smokes 1 pack of cigarettes a day.   PCP: [facility]   Past Medical History:  Diagnosis Date  . Anxiety   . Asthma   . Back pain with right-sided sciatica 06/18/2016  . Body mass index 36.0-36.9, adult 04/10/2016  . Body mass index 37.0-37.9, adult 03/13/2016  . Depression   . Dysmenorrhea 04/10/2016  . Hemorrhoids 02/10/2015  . Hiatal hernia   . Hypertension   . Menorrhagia with regular cycle 04/10/2016  . Muscle spasm of back 03/13/2016  . Obesity   . Perianal wart 02/10/2015  . Stress 02/14/2016  . Weight loss counseling, encounter for 03/13/2016    Patient Active Problem List   Diagnosis Date Noted  . 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 Numbness and tingling in left hand 08/19/2016  . Nausea 08/19/2016  . Back pain with right-sided sciatica 06/18/2016  . Menorrhagia with regular cycle 04/10/2016  . Dysmenorrhea 04/10/2016  . Body mass index 36.0-36.9, adult 04/10/2016  . 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 Muscle spasm of back 03/13/2016  . Obesity 03/13/2016  . Body mass index 37.0-37.9, adult 03/13/2016  . Weight loss counseling, encounter for 03/13/2016  . Stress 02/14/2016  . Perianal wart 02/10/2015  . Hemorrhoids 02/10/2015  . Tobacco use disorder 07/10/2013  . GERD (gastroesophageal reflux disease) 07/10/2013  . Asthma exacerbation 07/08/2013  . HTN (hypertension) 07/08/2013    Past Surgical History:  Procedure Laterality Date  . CESAREAN SECTION    .  CHOLECYSTECTOMY    . COLONOSCOPY    . ESOPHAGOGASTRODUODENOSCOPY ENDOSCOPY    . TUBAL LIGATION      OB History    Gravida Para Term Preterm AB Living   2 2       2    SAB TAB Ectopic Multiple Live Births                 Home Medications    Prior to Admission medications   Medication Sig Start Date End Date Taking? Authorizing Provider  albuterol (PROVENTIL HFA;VENTOLIN HFA) 108 (90 Base) MCG/ACT inhaler Inhale 2 puffs into the lungs every 4  (four) hours as needed. 11/08/16   Devoria Albe, MD  ALBUTEROL IN Inhale into the lungs as needed.    Historical Provider, MD  buPROPion (WELLBUTRIN SR) 150 MG 12 hr tablet Take 1 tablet (150 mg total) by mouth 2 (two) times daily. 09/16/16   [name], NP  cyclobenzaprine (FLEXERIL) 10 MG tablet Take 1 tablet (10 mg total) by mouth at bedtime. for muscle spams 10/29/16   Darreld Mclean, MD  fluticasone [facility]) 50 MCG/ACT nasal spray Place 2 sprays into both nostrils daily.  04/04/16   Historical Provider, MD  hydrochlorothiazide (MICROZIDE) 12.5 MG capsule Take 1 capsule (12.5 mg total) by mouth daily. 02/14/16   [name], NP  HYDROcodone-acetaminophen (NORCO/VICODIN) 5-325 MG tablet Take 1 tablet by mouth every 6 (six) hours as needed for moderate pain (Must last 14 days.Do not take and drive a car or use machinery.). 10/29/16   Darreld Mclean, MD  naproxen (NAPROSYN) 500 MG tablet Take 1 tablet (500 mg total) by mouth 2 (two) times daily with a meal. 09/03/16   Darreld Mclean, MD  ondansetron (ZOFRAN) 4 MG tablet Take 1 tablet (4 mg total) by mouth every 8 (eight) hours as needed for nausea or vomiting. 11/08/16   Devoria Albe, MD  predniSONE (DELTASONE) 20 MG tablet Take 3 po QD x 3d , then 2 po QD x 3d then 1 po QD x 3d 11/08/16   Devoria Albe, MD  promethazine (PHENERGAN) 25 MG tablet Take 1 tablet (25 mg total) by mouth every 6 (six) hours as needed for nausea or vomiting. 08/19/16   [name], NP    Family History Family History  Problem Relation Age of Onset  . Diabetes Mother   . Kidney failure Mother   . Depression Sister   . Hypertension Sister   . Anxiety disorder Sister   . Other Sister     back problems  . ADD / ADHD Daughter   . Other Daughter     behavioral issues  . ODD Daughter   . Asthma Son   . Cancer Maternal Grandmother   . Seizures Maternal Grandmother   . Lupus Other     Social History Social History  Substance Use Topics  . Smoking status:  Current Every Day Smoker    Packs/day: 1.00    Years: 20.00    Types: Cigarettes  . Smokeless tobacco: Never Used  . Alcohol use Yes     Comment: occ  employed   Allergies   Latex and Lisinopril   Review of Systems Review of Systems  Constitutional: Positive for fever.  HENT: Positive for ear pain.   Respiratory: Positive for cough and wheezing.   Gastrointestinal: Positive for diarrhea and nausea. Negative for abdominal pain and vomiting.  All other systems reviewed and are negative.  Physical Exam Updated Vital Signs BP 112/74 (  BP Location: Left Arm)   Pulse 82   Temp 97.4 F (36.3 C) (Oral)   Resp 20   Ht 5\' 2"  (1.575 m)   Wt 190 lb 4.8 oz (86.3 kg)   LMP 10/29/2016   SpO2 98%   BMI 34.81 kg/m   Vital signs normal    Physical Exam  Constitutional: She is oriented to person, place, and time. She appears well-developed and well-nourished.  Non-toxic appearance. She does not appear ill. No distress.  HENT:  Head: Normocephalic and atraumatic.  Right Ear: External ear normal.  Left Ear: External ear normal.  Nose: Nose normal. No mucosal edema or rhinorrhea.  Mouth/Throat: Mucous membranes are normal. No dental abscesses or uvula swelling.  Mucous membranes are dry.   Eyes: Conjunctivae and EOM are normal. Pupils are equal, round, and reactive to light.  Neck: Normal range of motion and full passive range of motion without pain. Neck supple.  Cardiovascular: Normal rate, regular rhythm and normal heart sounds.  Exam reveals no gallop and no friction rub.   No murmur heard. Pulmonary/Chest: No respiratory distress. She has decreased breath sounds. She has wheezes. She has rhonchi. She has no rales. She exhibits no tenderness and no crepitus.  Diffuse wheezing and rhonchi.   Abdominal: Soft. Normal appearance and bowel sounds are normal. She exhibits no distension. There is no tenderness. There is no rebound and no guarding.  Musculoskeletal: Normal range of  motion. She exhibits no edema or tenderness.  Moves all extremities well.   Neurological: She is alert and oriented to person, place, and time. She has normal strength. No cranial nerve deficit.  Skin: Skin is warm, dry and intact. No rash noted. No erythema. No pallor.  Psychiatric: She has a normal mood and affect. Her speech is normal and behavior is normal. Her mood appears not anxious.  Nursing note and vitals reviewed.  ED Treatments / Results  Labs (all labs ordered are listed, but only abnormal results are displayed) Results for orders placed or performed during the hospital encounter of 11/07/16  CBC with Differential  Result Value Ref Range   WBC 10.2 4.0 - 10.5 K/uL   RBC 4.53 3.87 - 5.11 MIL/uL   Hemoglobin 14.6 12.0 - 15.0 g/dL   HCT 81.143.8 91.436.0 - 78.246.0 %   MCV 96.7 78.0 - 100.0 fL   MCH 32.2 26.0 - 34.0 pg   MCHC 33.3 30.0 - 36.0 g/dL   RDW 95.612.5 21.311.5 - 08.615.5 %   Platelets 247 150 - 400 K/uL   Neutrophils Relative % 45 %   Neutro Abs 4.6 1.7 - 7.7 K/uL   Lymphocytes Relative 45 %   Lymphs Abs 4.6 (H) 0.7 - 4.0 K/uL   Monocytes Relative 6 %   Monocytes Absolute 0.6 0.1 - 1.0 K/uL   Eosinophils Relative 3 %   Eosinophils Absolute 0.3 0.0 - 0.7 K/uL   Basophils Relative 1 %   Basophils Absolute 0.1 0.0 - 0.1 K/uL  Comprehensive metabolic panel  Result Value Ref Range   Sodium 134 (L) 135 - 145 mmol/L   Potassium 3.7 3.5 - 5.1 mmol/L   Chloride 103 101 - 111 mmol/L   CO2 24 22 - 32 mmol/L   Glucose, Bld 87 65 - 99 mg/dL   BUN 16 6 - 20 mg/dL   Creatinine, Ser 5.780.66 0.44 - 1.00 mg/dL   Calcium 9.2 8.9 - 46.910.3 mg/dL   Total Protein 7.2 6.5 - 8.1 g/dL   Albumin  4.4 3.5 - 5.0 g/dL   AST 19 15 - 41 U/L   ALT 14 14 - 54 U/L   Alkaline Phosphatase 68 38 - 126 U/L   Total Bilirubin 0.3 0.3 - 1.2 mg/dL   GFR calc non Af Amer >60 >60 mL/min   GFR calc Af Amer >60 >60 mL/min   Anion gap 7 5 - 15  Urinalysis, Routine w reflex microscopic (not at [facility])  Result Value Ref  Range   Color, Urine YELLOW YELLOW   APPearance CLEAR CLEAR   Specific Gravity, Urine 1.020 1.005 - 1.030   pH 6.0 5.0 - 8.0   Glucose, UA NEGATIVE NEGATIVE mg/dL   Hgb urine dipstick NEGATIVE NEGATIVE   Bilirubin Urine NEGATIVE NEGATIVE   Ketones, ur NEGATIVE NEGATIVE mg/dL   Protein, ur NEGATIVE NEGATIVE mg/dL   Nitrite NEGATIVE NEGATIVE   Leukocytes, UA NEGATIVE NEGATIVE   Laboratory interpretation all normal     EKG  EKG Interpretation None       Radiology Dg Chest 2 View  Result Date: 11/08/2016 CLINICAL DATA:  Acute onset of productive cough and generalized weakness. Low-grade fever. Initial encounter. EXAM: CHEST  2 VIEW COMPARISON:  Chest radiograph performed 05/04/2015 FINDINGS: The lungs are well-aerated and clear. There is no evidence of focal opacification, pleural effusion or pneumothorax. The heart is normal in size; the mediastinal contour is within normal limits. No acute osseous abnormalities are seen. IMPRESSION: No acute cardiopulmonary process seen. Electronically Signed   By: Roanna Raider M.D.   On: 11/08/2016 01:40    Procedures Procedures (including critical care time)  Medications Ordered in ED Medications  sodium chloride 0.9 % bolus 1,000 mL (1,000 mLs Intravenous New Bag/Given 11/08/16 0021)  sodium chloride 0.9 % bolus 1,000 mL (1,000 mLs Intravenous New Bag/Given 11/08/16 0021)  ondansetron (ZOFRAN) injection 4 mg (4 mg Intravenous Given 11/08/16 0022)  albuterol (PROVENTIL,VENTOLIN) solution continuous neb (10 mg/hr Nebulization Given 11/08/16 0036)  ipratropium (ATROVENT) nebulizer solution 0.5 mg (0.5 mg Nebulization Given 11/08/16 0036)  methylPREDNISolone sodium succinate (SOLU-MEDROL) 125 mg/2 mL injection 125 mg (125 mg Intravenous Given 11/08/16 0021)    Initial Impression / Assessment and Plan / ED Course  I have reviewed the triage vital signs and the nursing notes.  Pertinent labs & imaging results that were available during my care of  the patient were reviewed by me and considered in my medical decision making (see chart for details).  Clinical Course   DIAGNOSTIC STUDIES:  Oxygen Saturation is 98% on RA, normal by my interpretation.    COORDINATION OF CARE:  12:16 AM Discussed treatment plan with pt at bedside and pt agreed to plan.Patient was given IV fluids and IV Zofran for her nausea and dehydration. Was started on IV steroids or her exacerbation of asthma. She was given a continuous albuterol/Atrovent nebulizer of 10 mg per hour.  Recheck at 2:45 AM patient's feeling much better. She said her IV fluids. Her nausea is gone. On reexam her wheezing and rhonchi are gone. She has improved air movement. I'm going to have nurses ambulate patient and monitor pulse ox.  Patient was able to ambulate in her pulse ox did not get lower than the mid 90s however she did not feel more short of breath for felt like her wheezing got worse. She was discharged home with nausea medication, steroids, she can finish her Z-Pak. She also was given a prescription for another inhaler.   Final Clinical Impressions(s) / ED Diagnoses  Final diagnoses:  Exacerbation of asthma, unspecified asthma severity, unspecified whether persistent  Bronchitis  Nausea  Dehydration    New Prescriptions New Prescriptions   ALBUTEROL (PROVENTIL HFA;VENTOLIN HFA) 108 (90 BASE) MCG/ACT INHALER    Inhale 2 puffs into the lungs every 4 (four) hours as needed.   ONDANSETRON (ZOFRAN) 4 MG TABLET    Take 1 tablet (4 mg total) by mouth every 8 (eight) hours as needed for nausea or vomiting.   PREDNISONE (DELTASONE) 20 MG TABLET    Take 3 po QD x 3d , then 2 po QD x 3d then 1 po QD x 3d   Plan discharge  Devoria Albe, MD, Concha Pyo, MD 11/08/16 [phone]

## 2016-11-07 NOTE — ED Triage Notes (Addendum)
Pt reports feeling like she came down with the flu on Thanksgiving day. Pt reports generalized weakness, fever, nausea, and decreased appetite. Pt states she went to an urgent care on Monday and was given a z-pac and prednisone and pt states she feels like she is getting worse. Pt states she is so nauseated she can't eat or drink.

## 2016-11-08 ENCOUNTER — Emergency Department (HOSPITAL_COMMUNITY): Payer: BLUE CROSS/BLUE SHIELD

## 2016-11-08 MED ORDER — IPRATROPIUM BROMIDE 0.02 % IN SOLN
0.5000 mg | Freq: Once | RESPIRATORY_TRACT | Status: AC
  Administered 2016-11-08: 0.5 mg via RESPIRATORY_TRACT
  Filled 2016-11-08: qty 2.5

## 2016-11-08 MED ORDER — METHYLPREDNISOLONE SODIUM SUCC 125 MG IJ SOLR
125.0000 mg | Freq: Once | INTRAMUSCULAR | Status: AC
  Administered 2016-11-08: 125 mg via INTRAVENOUS
  Filled 2016-11-08: qty 2

## 2016-11-08 MED ORDER — ONDANSETRON HCL 4 MG PO TABS: 4.0000 mg | ORAL_TABLET | Freq: Three times a day (TID) | ORAL | 0 refills | Status: DC | PRN

## 2016-11-08 MED ORDER — PREDNISONE 20 MG PO TABS: ORAL_TABLET | ORAL | 0 refills | Status: DC

## 2016-11-08 MED ORDER — SODIUM CHLORIDE 0.9 % IV BOLUS (SEPSIS)
1000.0000 mL | Freq: Once | INTRAVENOUS | Status: AC
  Administered 2016-11-08: 1000 mL via INTRAVENOUS

## 2016-11-08 MED ORDER — ONDANSETRON HCL 4 MG/2ML IJ SOLN
4.0000 mg | Freq: Once | INTRAMUSCULAR | Status: AC
  Administered 2016-11-08: 4 mg via INTRAVENOUS
  Filled 2016-11-08: qty 2

## 2016-11-08 MED ORDER — ALBUTEROL SULFATE HFA 108 (90 BASE) MCG/ACT IN AERS: 2.0000 | INHALATION_SPRAY | RESPIRATORY_TRACT | 0 refills | Status: DC | PRN

## 2016-11-08 MED ORDER — ALBUTEROL (5 MG/ML) CONTINUOUS INHALATION SOLN
10.0000 mg/h | INHALATION_SOLUTION | Freq: Once | RESPIRATORY_TRACT | Status: AC
  Administered 2016-11-08: 10 mg/h via RESPIRATORY_TRACT
  Filled 2016-11-08: qty 20

## 2016-11-08 NOTE — ED Notes (Signed)
Pt O2 started out at 99 and began dropping halfway through walk (94 lowest). Went back up to 99/100 closer to room.

## 2016-11-08 NOTE — Discharge Instructions (Signed)
Try to drink plenty of fluids so you don't get dehydrated. Use the zofran for nausea or vomiting. Finish your antibiotics. Take the prednisone until gone. Use your inhaler for wheezing or shortness of breath.  Recheck if you get worse again.

## 2016-11-11 ENCOUNTER — Ambulatory Visit: Payer: BLUE CROSS/BLUE SHIELD | Admitting: Adult Health

## 2016-11-18 ENCOUNTER — Other Ambulatory Visit: Payer: Self-pay | Admitting: Adult Health

## 2016-11-25 ENCOUNTER — Other Ambulatory Visit: Payer: BLUE CROSS/BLUE SHIELD | Admitting: Adult Health

## 2016-12-04 ENCOUNTER — Ambulatory Visit (INDEPENDENT_AMBULATORY_CARE_PROVIDER_SITE_OTHER): Payer: BLUE CROSS/BLUE SHIELD | Admitting: Family Medicine

## 2016-12-04 ENCOUNTER — Encounter: Payer: Self-pay | Admitting: Family Medicine

## 2016-12-04 VITALS — BP 134/80 | HR 100 | Temp 98.0°F | Resp 16 | Ht 62.0 in | Wt 196.0 lb

## 2016-12-04 DIAGNOSIS — I10 Essential (primary) hypertension: Secondary | ICD-10-CM

## 2016-12-04 DIAGNOSIS — K219 Gastro-esophageal reflux disease without esophagitis: Secondary | ICD-10-CM | POA: Diagnosis not present

## 2016-12-04 DIAGNOSIS — E6609 Other obesity due to excess calories: Secondary | ICD-10-CM

## 2016-12-04 DIAGNOSIS — Z23 Encounter for immunization: Secondary | ICD-10-CM | POA: Diagnosis not present

## 2016-12-04 DIAGNOSIS — Z7689 Persons encountering health services in other specified circumstances: Secondary | ICD-10-CM

## 2016-12-04 DIAGNOSIS — Z6835 Body mass index (BMI) 35.0-35.9, adult: Secondary | ICD-10-CM

## 2016-12-04 DIAGNOSIS — J4520 Mild intermittent asthma, uncomplicated: Secondary | ICD-10-CM

## 2016-12-04 DIAGNOSIS — M1990 Unspecified osteoarthritis, unspecified site: Secondary | ICD-10-CM

## 2016-12-04 DIAGNOSIS — E785 Hyperlipidemia, unspecified: Secondary | ICD-10-CM | POA: Diagnosis not present

## 2016-12-04 HISTORY — DX: Unspecified osteoarthritis, unspecified site: M19.90

## 2016-12-04 MED ORDER — PANTOPRAZOLE SODIUM 40 MG PO TBEC: 40.0000 mg | DELAYED_RELEASE_TABLET | Freq: Every day | ORAL | 3 refills | Status: DC

## 2016-12-04 MED ORDER — ONDANSETRON HCL 4 MG PO TABS: 4.0000 mg | ORAL_TABLET | Freq: Three times a day (TID) | ORAL | 0 refills | Status: DC | PRN

## 2016-12-04 NOTE — Patient Instructions (Signed)
I have ordered blood work  Continue to exercise Congratulations on losing weight  Try to cut down or quit smoking  Take the pantoprazole once a day on an empty stomach This is to reduce stomach acid I have placed a referral to GI specialist  See me in a month

## 2016-12-04 NOTE — Progress Notes (Signed)
Chief Complaint  Patient presents with  . Establish Care   New to establish Her complaints are abdominal pain and nausea and heartburn.  Known hiatal hernia and GERD but is not on a PPI, says omeprazole "did not work".  She takes ibuprofen 800 mg for back pain and smokes, which likely contributes. Requests referral GI. She has chronic back pain.  Went to a couple of visits to Pt.  Normal x rays in 2015.  Has seen Dr Hilda LiasKeeling in orthopedics. States she was previously diagnosed with bipolar illness, depression and anxiety.  Not currently on medicine. Feels well. I have discussed the multiple health risks associated with cigarette smoking including, but not limited to, cardiovascular disease, lung disease and cancer.  I have strongly recommended that smoking be stopped.  I have reviewed the various methods of quitting including cold [place], classes, nicotine replacements and prescription medications.  I have offered assistance in this difficult process.  The patient is not interested in assistance at this time.  Is up to date with PAP and immunizations. Flu shot today    Patient Active Problem List   Diagnosis Date Noted  . 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 Numbness and tingling in left hand 08/19/2016  . Back pain with right-sided sciatica 06/18/2016  . Dysmenorrhea 04/10/2016  . Muscle spasm of back 03/13/2016  . Obesity 03/13/2016  . Weight loss counseling, encounter for 03/13/2016  . Stress 02/14/2016  . Bipolar disorder (HCC) 01/15/2016  . Hiatal hernia 01/15/2016  . Migraine 01/15/2016  . Tobacco use disorder 07/10/2013  . GERD (gastroesophageal reflux disease) 07/10/2013  . HTN (hypertension) 07/08/2013  . Asthma, mild persistent 04/05/2013  . Hyperlipidemia 04/09/2011  . Depressive disorder, not elsewhere classified 03/27/2007  . Allergic rhinitis 08/16/2003    Outpatient Encounter Prescriptions as of 12/04/2016  Medication Sig  . albuterol (PROVENTIL  HFA;VENTOLIN HFA) 108 (90 Base) MCG/ACT inhaler Inhale 2 puffs into the lungs every 4 (four) hours as needed.  [name]. buPROPion (WELLBUTRIN SR) 150 MG 12 hr tablet Take 1 tablet (150 mg total) by mouth 2 (two) times daily.  . cyclobenzaprine (FLEXERIL) 10 MG tablet Take 1 tablet (10 mg total) by mouth at bedtime. for muscle spams  . fluticasone (FLONASE) 50 MCG/ACT nasal spray Place 2 sprays into both nostrils daily.   . hydrochlorothiazide (MICROZIDE) 12.5 MG capsule Take 1 capsule (12.5 mg total) by mouth daily.  [name]. ibuprofen (ADVIL,MOTRIN) 800 MG tablet TAKE (1) TABLET BY MOUTH EVERY EIGHT HOURS AS NEEDED.  [name]. ondansetron (ZOFRAN) 4 MG tablet Take 1 tablet (4 mg total) by mouth every 8 (eight) hours as needed for nausea or vomiting.   No facility-administered encounter medications on file as of 12/04/2016.     Past Medical History:  Diagnosis Date  . Allergy    SEASONAL  . 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 Anxiety   . Asthma   . Back pain with right-sided sciatica 06/18/2016  . Body mass index 36.0-36.9, adult 04/10/2016  . Body mass index 37.0-37.9, adult 03/13/2016  . Depression   . Dysmenorrhea 04/10/2016  . GERD (gastroesophageal reflux disease)   . Hemorrhoids 02/10/2015  . Hiatal hernia   . Hyperlipidemia 04/09/2011  . Hypertension   . Menorrhagia with regular cycle 04/10/2016  . Muscle spasm of back 03/13/2016  . Obesity   . Perianal wart 02/10/2015  . Stress 02/14/2016  . Weight loss counseling, encounter for 03/13/2016    Past Surgical History:  Procedure Laterality Date  .  CESAREAN SECTION    . CHOLECYSTECTOMY    . COLONOSCOPY    . ESOPHAGOGASTRODUODENOSCOPY ENDOSCOPY    . TUBAL LIGATION      Social History   Social History  . Marital status: Married    Spouse name: Lars MageJuan  . Number of children: 2  . Years of education: 14   Occupational History  . leasing agent    Social History Main Topics  . Smoking status: Current Every Day Smoker    Packs/day: 1.00    Years: 20.00    Types:  Cigarettes  . Smokeless tobacco: Never Used     Comment: trying to quit  . Alcohol use Yes     Comment: occ  . Drug use: No  . Sexual activity: Yes    Birth control/ protection: Surgical     Comment: tubal   Other Topics Concern  . Not on file   Social History Narrative   Married   3 children at home   Step son is 7       Family History  Problem Relation Age of Onset  . Diabetes Mother   . Kidney failure Mother   . Early death Mother [id number]    diabetes complications  . Cancer Father [id number]    colon cancer  . Depression Sister   . Hypertension Sister   . Anxiety disorder Sister   . Other Sister     back problems  . ADD / ADHD Daughter   . Other Daughter     behavioral issues  . ODD Daughter   . Asthma Son   . Cancer Maternal Grandmother     breast  . Seizures Maternal Grandmother   . Lupus Other     Review of Systems  Constitutional: Negative for chills, fever and weight loss.  HENT: Negative for congestion and hearing loss.   Eyes: Negative for blurred vision and pain.  Respiratory: Negative for cough and shortness of breath.   Cardiovascular: Negative for chest pain and leg swelling.  Gastrointestinal: Positive for abdominal pain, heartburn and nausea. Negative for constipation and diarrhea.  Genitourinary: Negative for dysuria and frequency.  Musculoskeletal: Positive for back pain. Negative for falls, joint pain and myalgias.  Neurological: Negative for dizziness, seizures and headaches.  Psychiatric/Behavioral: Negative for depression. The patient is not nervous/anxious and does not have insomnia.     BP 134/80   Pulse 100   Temp 98 F (36.7 C) (Oral)   Resp 16   Ht 5\' 2"  (1.575 m)   Wt 196 lb 0.6 oz (88.9 kg)   LMP 11/26/2016 (Exact Date)   SpO2 100%   BMI 35.86 kg/m   Physical Exam  Constitutional: She is oriented to person, place, and time. She appears well-developed and well-nourished.  HENT:  Head: Normocephalic and atraumatic.  Right Ear:  External ear normal.  Left Ear: External ear normal.  Mouth/Throat: Oropharynx is clear and moist.  Eyes: Conjunctivae are normal. Pupils are equal, round, and reactive to light.  Neck: Normal range of motion. Neck supple. No thyromegaly present.  Cardiovascular: Normal rate, regular rhythm and normal heart sounds.   Pulmonary/Chest: Effort normal and breath sounds normal. No respiratory distress.  Abdominal: Soft. Bowel sounds are normal.  Musculoskeletal: Normal range of motion. She exhibits no edema.  Lymphadenopathy:    She has no cervical adenopathy.  Neurological: She is alert and oriented to person, place, and time. She displays normal reflexes.  Gait normal  Skin: Skin is warm and  dry.  Psychiatric: She has a normal mood and affect. Her behavior is normal. Thought content normal.  Nursing note and vitals reviewed.   1. Asthma in adult, mild intermittent, uncomplicated   2. Hyperlipidemia, unspecified hyperlipidemia type  - COMPLETE METABOLIC PANEL WITH GFR - Hemoglobin A1c - CBC - VITAMIN D 25 Hydroxy (Vit-D Deficiency, Fractures) - TSH - Urinalysis, Routine w reflex microscopic - Lipid panel  3. Essential hypertension   4. Class 2 obesity due to excess calories with body mass index (BMI) of 35.0 to 35.9 in adult, unspecified whether serious comorbidity present   5. Gastroesophageal reflux disease, esophagitis presence not specified  - Ambulatory referral to Gastroenterology  6. Encounter to establish care with new doctor    Patient Instructions  I have ordered blood work  Continue to exercise Congratulations on losing weight  Try to cut down or quit smoking  Take the pantoprazole once a day on an empty stomach This is to reduce stomach acid I have placed a referral to GI specialist  See me in a month       Eustace Moore, MD

## 2016-12-06 LAB — COMPLETE METABOLIC PANEL WITH GFR
ALBUMIN: 4.5 g/dL (ref 3.6–5.1)
ALK PHOS: 78 U/L (ref 33–115)
ALT: 17 U/L (ref 6–29)
AST: 18 U/L (ref 10–30)
BILIRUBIN TOTAL: 0.4 mg/dL (ref 0.2–1.2)
BUN: 11 mg/dL (ref 7–25)
CO2: 26 mmol/L (ref 20–31)
Calcium: 9.6 mg/dL (ref 8.6–10.2)
Chloride: 107 mmol/L (ref 98–110)
Creat: 0.79 mg/dL (ref 0.50–1.10)
GFR, Est Non African American: >89 mL/min (ref >=60)
Glucose, Bld: 83 mg/dL (ref 65–99)
POTASSIUM: 4.6 mmol/L (ref 3.5–5.3)
Sodium: 143 mmol/L (ref 135–146)
TOTAL PROTEIN: 7 g/dL (ref 6.1–8.1)

## 2016-12-06 LAB — URINALYSIS, ROUTINE W REFLEX MICROSCOPIC
BILIRUBIN URINE: NEGATIVE
Glucose, UA: NEGATIVE
HGB URINE DIPSTICK: NEGATIVE
KETONES UR: NEGATIVE
Leukocytes, UA: NEGATIVE
Nitrite: NEGATIVE
PROTEIN: NEGATIVE
Specific Gravity, Urine: 1.019 (ref 1.001–1.035)
pH: 6 (ref 5.0–8.0)

## 2016-12-06 LAB — CBC
HCT: 44.4 % (ref 35.0–45.0)
Hemoglobin: 14.9 g/dL (ref 11.7–15.5)
MCH: 32.2 pg (ref 27.0–33.0)
MCHC: 33.6 g/dL (ref 32.0–36.0)
MCV: 95.9 fL (ref 80.0–100.0)
MPV: 9.7 fL (ref 7.5–12.5)
PLATELETS: 263 10*3/uL (ref 140–400)
RBC: 4.63 MIL/uL (ref 3.80–5.10)
RDW: 13.2 % (ref 11.0–15.0)
WBC: 6.1 10*3/uL (ref 3.8–10.8)

## 2016-12-06 LAB — LIPID PANEL
CHOL/HDL RATIO: 3.5 ratio (ref <5.0)
Cholesterol: 208 mg/dL — ABNORMAL HIGH (ref <200)
HDL: 59 mg/dL (ref >50)
LDL Cholesterol: 128 mg/dL — ABNORMAL HIGH (ref <100)
Triglycerides: 106 mg/dL (ref <150)
VLDL: 21 mg/dL (ref <30)

## 2016-12-06 LAB — TSH: TSH: 2.34 m[IU]/L

## 2016-12-07 LAB — HEMOGLOBIN A1C
Hgb A1c MFr Bld: 4.9 % (ref <5.7)
MEAN PLASMA GLUCOSE: 94 mg/dL

## 2016-12-07 LAB — VITAMIN D 25 HYDROXY (VIT D DEFICIENCY, FRACTURES): VIT D 25 HYDROXY: 42 ng/mL (ref 30–100)

## 2016-12-10 ENCOUNTER — Other Ambulatory Visit (HOSPITAL_COMMUNITY)
Admission: RE | Admit: 2016-12-10 | Discharge: 2016-12-10 | Disposition: A | Discharge status: Home / Self Care | Payer: BLUE CROSS/BLUE SHIELD | Source: Ambulatory Visit | Attending: Adult Health | Admitting: Adult Health

## 2016-12-10 ENCOUNTER — Encounter: Payer: Self-pay | Admitting: Adult Health

## 2016-12-10 ENCOUNTER — Ambulatory Visit (INDEPENDENT_AMBULATORY_CARE_PROVIDER_SITE_OTHER): Payer: BLUE CROSS/BLUE SHIELD | Admitting: Adult Health

## 2016-12-10 ENCOUNTER — Encounter: Payer: Self-pay | Admitting: Family Medicine

## 2016-12-10 VITALS — BP 142/94 | HR 89 | Ht 62.0 in | Wt 196.0 lb

## 2016-12-10 DIAGNOSIS — E6609 Other obesity due to excess calories: Secondary | ICD-10-CM

## 2016-12-10 DIAGNOSIS — Z6835 Body mass index (BMI) 35.0-35.9, adult: Secondary | ICD-10-CM | POA: Diagnosis not present

## 2016-12-10 DIAGNOSIS — Z01419 Encounter for gynecological examination (general) (routine) without abnormal findings: Secondary | ICD-10-CM

## 2016-12-10 DIAGNOSIS — Z1151 Encounter for screening for human papillomavirus (HPV): Secondary | ICD-10-CM | POA: Diagnosis not present

## 2016-12-10 DIAGNOSIS — N946 Dysmenorrhea, unspecified: Secondary | ICD-10-CM

## 2016-12-10 DIAGNOSIS — N920 Excessive and frequent menstruation with regular cycle: Secondary | ICD-10-CM | POA: Diagnosis not present

## 2016-12-10 NOTE — Progress Notes (Signed)
Patient ID: Destiny Petty, female   DOB: [birth year], 36 y.o.   MRN: [id number] History of Present Illness: Destiny Petty is a 36 year old white female,married in for well woman gyn exam and pap.She is complaining of heavy periods,they last 4-5 days and are heavy first 3-4 days and may change tampon and pad every 3-45 minutes and back and legs hurt. Had labs recently with Dr Delton SeeNelson.She is interested in endometrial ablation and wants to lose more weight and then have tummy tuck. PCP Dr Delton SeeNelson.   Current Medications, Allergies, Past Medical History, Past Surgical History, Family History and Social History were reviewed in [facility] electronic medical record.     Review of Systems: Patient denies any headaches, hearing loss, fatigue, blurred vision, shortness of breath, chest pain, abdominal pain, problems with bowel movements, urination, or intercourse. No joint pain or mood swings.See HPI for positives.    Physical Exam:BP (!) 142/94 (BP Location: Left Arm, Patient Position: Sitting, Cuff Size: Normal)   Pulse 89   Ht 5\' 2"  (1.575 m)   Wt 196 lb (88.9 kg)   LMP 11/26/2016 (Exact Date)   BMI 35.85 kg/m  General:  Well developed, well nourished, no acute distress Skin:  Warm and dry Neck:  Midline trachea, normal thyroid, good ROM, no lymphadenopathy Lungs; Clear to auscultation bilaterally Breast:  No dominant palpable mass, retraction, or nipple discharge Cardiovascular: Regular rate and rhythm Abdomen:  Soft, non tender, no hepatosplenomegaly, has panniculus where weight loss Pelvic:  External genitalia is normal in appearance, no lesions.  The vagina is normal in appearance. Urethra has no lesions or masses. The cervix is bulbous.Pap with HPV performed.  Uterus is felt to be normal size, shape, and contour.  No adnexal masses or tenderness noted.Bladder is non tender, no masses felt. Extremities/musculoskeletal:  No swelling or varicosities noted, no clubbing or cyanosis Psych:   No mood changes, alert and cooperative,seems happy PHQ 2 score 0. Will get GYN US to assess uterus.Will talk more about weight loss after US and if gets ablation.   Impression: 1. Encounter for gynecological examination with Papanicolaou smear of cervix   2. Menorrhagia with regular cycle   3. Dysmenorrhea   4. Body mass index 35.0-35.9, adult       Plan: Return in 1 week for GYN US,will talk when result back Physical in 1 year Review handout on menorrhagia and endometrial ablation

## 2016-12-10 NOTE — Patient Instructions (Signed)
Menorrhagia °Menorrhagia is the medical term for when your menstrual periods are heavy or last longer than usual. With menorrhagia, every period you have may cause enough blood loss and cramping that you are unable to maintain your usual activities. °CAUSES  °In some cases, the cause of heavy periods is unknown, but a number of conditions may cause menorrhagia. Common causes include: °· A problem with the hormone-producing thyroid gland (hypothyroid). °· Noncancerous growths in the uterus (polyps or fibroids). °· An imbalance of the estrogen and progesterone hormones. °· One of your ovaries not releasing an egg during one or more months. °· Side effects of having an intrauterine device (IUD). °· Side effects of some medicines, such as anti-inflammatory medicines or blood thinners. °· A bleeding disorder that stops your blood from clotting normally. °SIGNS AND SYMPTOMS  °During a normal period, bleeding lasts between 4 and 8 days. Signs that your periods are too heavy include: °· You routinely have to change your pad or tampon every 1 or 2 hours because it is completely soaked. °· You pass blood clots larger than 1 inch (2.5 cm) in size. °· You have bleeding for more than 7 days. °· You need to use pads and tampons at the same time because of heavy bleeding. °· You need to wake up to change your pads or tampons during the night. °· You have symptoms of anemia, such as tiredness, fatigue, or shortness of breath.  °DIAGNOSIS  °Your health care provider will perform a physical exam and ask you questions about your symptoms and menstrual history. Other tests may be ordered based on what the health care provider finds during the exam. These tests can include: °· Blood tests. Blood tests are used to check if you are pregnant or have hormonal changes, a bleeding or thyroid disorder, low iron levels (anemia), or other problems. °· Endometrial biopsy. Your health care provider takes a sample of tissue from the inside of your  uterus to be examined under a microscope. °· Pelvic ultrasound. This test uses sound waves to make a picture of your uterus, ovaries, and vagina. The pictures can show if you have fibroids or other growths. °· Hysteroscopy. For this test, your health care provider will use a small telescope to look inside your uterus. °Based on the results of your initial tests, your health care provider may recommend further testing. °TREATMENT  °Treatment may not be needed. If it is needed, your health care provider may recommend treatment with one or more medicines first. If these do not reduce bleeding enough, a surgical treatment might be an option. The best treatment for you will depend on:  °· Whether you need to prevent pregnancy.   °· Your desire to have children in the future. °· The cause and severity of your bleeding. °· Your opinion and personal preference.   °Medicines for menorrhagia may include: °· Birth control methods that use hormones. These include the pill, skin patch, vaginal ring, shots that you get every 3 months, hormonal IUD, and implant. These treatments reduce bleeding during your menstrual period. °· Medicines that thicken blood and slow bleeding. °· Medicines that reduce swelling, such as ibuprofen.  °· Medicines that contain a synthetic hormone called progestin.   °· Medicines that make the ovaries stop working for a short time.   °You may need surgical treatment for menorrhagia if the medicines are unsuccessful. Treatment options include: °· Dilation and curettage (D&C). In this procedure, your health care provider opens (dilates) your cervix and then scrapes or suctions tissue from   the lining of your uterus to reduce menstrual bleeding. °· Operative hysteroscopy. This procedure uses a tiny tube with a light (hysteroscope) to view your uterine cavity and can help in the surgical removal of a polyp that may be causing heavy periods. °· Endometrial ablation. Through various techniques, your health care  provider permanently destroys the entire lining of your uterus (endometrium). After endometrial ablation, most women have little or no menstrual flow. Endometrial ablation reduces your ability to become pregnant. °· Endometrial resection. This surgical procedure uses an electrosurgical wire loop to remove the lining of the uterus. This procedure also reduces your ability to become pregnant. °· Hysterectomy. Surgical removal of the uterus and cervix is a permanent procedure that stops menstrual periods. Pregnancy is not possible after a hysterectomy. This procedure requires anesthesia and hospitalization. °HOME CARE INSTRUCTIONS  °· Only take over-the-counter or prescription medicines as directed by your health care provider. Take prescribed medicines exactly as directed. Do not change or switch medicines without consulting your health care provider. °· Take any prescribed iron pills exactly as directed by your health care provider. Long-term heavy bleeding may result in low iron levels. Iron pills help replace the iron your body lost from heavy bleeding. Iron may cause constipation. If this becomes a problem, increase the bran, fruits, and roughage in your diet. °· Do not take aspirin or medicines that contain aspirin 1 week before or during your menstrual period. Aspirin may make the bleeding worse. °· If you need to change your sanitary pad or tampon more than once every 2 hours, stay in bed and rest as much as possible until the bleeding stops. °· Eat well-balanced meals. Eat foods high in iron. Examples are leafy green vegetables, meat, liver, eggs, and whole grain breads and cereals. Do not try to lose weight until the abnormal bleeding has stopped and your blood iron level is back to normal. °SEEK MEDICAL CARE IF:  °· You soak through a pad or tampon every 1 or 2 hours, and this happens every time you have a period. °· You need to use pads and tampons at the same time because you are bleeding so much. °· You  need to change your pad or tampon during the night. °· You have a period that lasts for more than 8 days. °· You pass clots bigger than 1 inch wide. °· You have irregular periods that happen more or less often than once a month. °· You feel dizzy or faint. °· You feel very weak or tired. °· You feel short of breath or feel your heart is beating too fast when you exercise. °· You have nausea and vomiting or diarrhea while you are taking your medicine. °· You have any problems that may be related to the medicine you are taking. °SEEK IMMEDIATE MEDICAL CARE IF:  °· You soak through 4 or more pads or tampons in 2 hours. °· You have any bleeding while you are pregnant. °MAKE SURE YOU:  °· Understand these instructions. °· Will watch your condition. °· Will get help right away if you are not doing well or get worse. °This information is not intended to replace advice given to you by your health care provider. Make sure you discuss any questions you have with your health care provider. °Document Released: 11/25/2005 Document Revised: 03/18/2016 Document Reviewed: 05/16/2013 °Elsevier Interactive Patient Education © 2017 [employer] ° °

## 2016-12-11 ENCOUNTER — Encounter (INDEPENDENT_AMBULATORY_CARE_PROVIDER_SITE_OTHER): Payer: Self-pay | Admitting: Internal Medicine

## 2016-12-13 LAB — CYTOLOGY - PAP
Diagnosis: NEGATIVE
HPV (WINDOPATH): NOT DETECTED

## 2016-12-17 ENCOUNTER — Ambulatory Visit (INDEPENDENT_AMBULATORY_CARE_PROVIDER_SITE_OTHER): Payer: BLUE CROSS/BLUE SHIELD

## 2016-12-17 DIAGNOSIS — N946 Dysmenorrhea, unspecified: Secondary | ICD-10-CM

## 2016-12-17 DIAGNOSIS — N854 Malposition of uterus: Secondary | ICD-10-CM | POA: Diagnosis not present

## 2016-12-17 DIAGNOSIS — N83202 Unspecified ovarian cyst, left side: Secondary | ICD-10-CM | POA: Diagnosis not present

## 2016-12-17 DIAGNOSIS — N920 Excessive and frequent menstruation with regular cycle: Secondary | ICD-10-CM | POA: Diagnosis not present

## 2016-12-17 NOTE — Progress Notes (Signed)
PELVIC US TA/TV: Homogenous anteverted uterus,wnl,EEC 14.5 mm,complex left ovarian cyst 2.5 x 1.2 x 1.9 cm,normal right ov,no free fluid,no pain during ultrasound,ov's appear mobile

## 2016-12-18 ENCOUNTER — Telehealth: Payer: Self-pay | Admitting: Adult Health

## 2016-12-18 ENCOUNTER — Encounter: Payer: Self-pay | Admitting: Adult Health

## 2016-12-18 DIAGNOSIS — N83209 Unspecified ovarian cyst, unspecified side: Secondary | ICD-10-CM

## 2016-12-18 DIAGNOSIS — N83202 Unspecified ovarian cyst, left side: Secondary | ICD-10-CM

## 2016-12-18 HISTORY — DX: Unspecified ovarian cyst, unspecified side: N83.209

## 2016-12-18 NOTE — Telephone Encounter (Signed)
Pt aware US showed cyst left ovary, make appt with Dr Emelda FearFerguson to discuss options for pain and periods

## 2016-12-25 ENCOUNTER — Ambulatory Visit: Payer: BLUE CROSS/BLUE SHIELD | Admitting: Obstetrics and Gynecology

## 2017-01-01 ENCOUNTER — Encounter (INDEPENDENT_AMBULATORY_CARE_PROVIDER_SITE_OTHER): Payer: Self-pay | Admitting: Internal Medicine

## 2017-01-01 ENCOUNTER — Ambulatory Visit (INDEPENDENT_AMBULATORY_CARE_PROVIDER_SITE_OTHER): Payer: BLUE CROSS/BLUE SHIELD | Admitting: Internal Medicine

## 2017-01-01 VITALS — BP 132/70 | HR 64 | Resp 18 | Ht 62.0 in | Wt 195.3 lb

## 2017-01-01 DIAGNOSIS — R634 Abnormal weight loss: Secondary | ICD-10-CM

## 2017-01-01 DIAGNOSIS — R112 Nausea with vomiting, unspecified: Secondary | ICD-10-CM
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
## 2017-01-01 NOTE — Patient Instructions (Addendum)
Will get an emptying study. Increase Protonix to twice a day

## 2017-01-03 ENCOUNTER — Encounter (HOSPITAL_COMMUNITY)
Admission: RE | Admit: 2017-01-03 | Discharge: 2017-01-03 | Disposition: A | Discharge status: Home / Self Care | Payer: BLUE CROSS/BLUE SHIELD | Source: Ambulatory Visit | Attending: Internal Medicine | Admitting: Internal Medicine

## 2017-01-03 ENCOUNTER — Encounter (HOSPITAL_COMMUNITY): Payer: Self-pay

## 2017-01-03 DIAGNOSIS — R112 Nausea with vomiting, unspecified: Secondary | ICD-10-CM | POA: Insufficient documentation

## 2017-01-03 DIAGNOSIS — R634 Abnormal weight loss: Secondary | ICD-10-CM | POA: Diagnosis not present

## 2017-01-03 MED ORDER — TECHNETIUM TC 99M SULFUR COLLOID
2.0000 | Freq: Once | INTRAVENOUS | Status: AC | PRN
  Administered 2017-01-03: 2 via INTRAVENOUS

## 2017-01-06 ENCOUNTER — Ambulatory Visit: Payer: BLUE CROSS/BLUE SHIELD | Admitting: Obstetrics and Gynecology

## 2017-01-06 ENCOUNTER — Ambulatory Visit: Payer: BLUE CROSS/BLUE SHIELD | Admitting: Family Medicine

## 2017-01-07 ENCOUNTER — Encounter (INDEPENDENT_AMBULATORY_CARE_PROVIDER_SITE_OTHER): Payer: Self-pay

## 2017-01-09 ENCOUNTER — Telehealth: Payer: Self-pay | Admitting: Family Medicine

## 2017-01-09 NOTE — Telephone Encounter (Signed)
Destiny Petty is calling asking if Dr[facility] As to whats going on with her, please advise?

## 2017-01-10 NOTE — Telephone Encounter (Signed)
I got the initial consult that confirmed gastric reflux, plus the need to do additional testing.  No final diagnosis.

## 2017-01-10 NOTE — Telephone Encounter (Signed)
Pt aware.

## 2017-01-15 ENCOUNTER — Ambulatory Visit: Payer: BLUE CROSS/BLUE SHIELD | Admitting: Obstetrics and Gynecology

## 2017-01-20 ENCOUNTER — Encounter: Payer: Self-pay | Admitting: Obstetrics and Gynecology

## 2017-01-20 ENCOUNTER — Ambulatory Visit (INDEPENDENT_AMBULATORY_CARE_PROVIDER_SITE_OTHER): Payer: BLUE CROSS/BLUE SHIELD | Admitting: Family Medicine

## 2017-01-20 ENCOUNTER — Encounter: Payer: Self-pay | Admitting: Family Medicine

## 2017-01-20 ENCOUNTER — Ambulatory Visit (INDEPENDENT_AMBULATORY_CARE_PROVIDER_SITE_OTHER): Payer: BLUE CROSS/BLUE SHIELD | Admitting: Obstetrics and Gynecology

## 2017-01-20 VITALS — BP 130/78 | HR 100 | Temp 98.5°F | Resp 18 | Ht 62.0 in | Wt 202.0 lb

## 2017-01-20 VITALS — BP 132/90 | HR 74 | Wt 200.0 lb

## 2017-01-20 DIAGNOSIS — N921 Excessive and frequent menstruation with irregular cycle: Secondary | ICD-10-CM | POA: Diagnosis not present

## 2017-01-20 DIAGNOSIS — N920 Excessive and frequent menstruation with regular cycle: Secondary | ICD-10-CM

## 2017-01-20 DIAGNOSIS — N946 Dysmenorrhea, unspecified: Secondary | ICD-10-CM

## 2017-01-20 DIAGNOSIS — F17200 Nicotine dependence, unspecified, uncomplicated: Secondary | ICD-10-CM

## 2017-01-20 DIAGNOSIS — K219 Gastro-esophageal reflux disease without esophagitis: Secondary | ICD-10-CM

## 2017-01-20 DIAGNOSIS — I10 Essential (primary) hypertension: Secondary | ICD-10-CM

## 2017-01-20 MED ORDER — PANTOPRAZOLE SODIUM 40 MG PO TBEC: 40.0000 mg | DELAYED_RELEASE_TABLET | Freq: Every day | ORAL | 3 refills | Status: DC

## 2017-01-20 NOTE — Progress Notes (Signed)
Chief Complaint  Patient presents with  . Follow-up    1 month  Patient is here for follow-up She is under the care of GYN for dysfunctional bleeding. She saw Dr. Emelda FearFerguson today. She had an ultrasound. She is going to be scheduled for an ablation. She is under the care of GI for her GERD nausea and vomiting. She had a motility study that was normal. She states her symptoms are better taking Protonix. She has a weight problem. She's gained weight and is now back over 200 pounds. We discussed the importance of diet and exercise. Her blood pressure is well controlled on her current medication. She continues to smoke cigarettes. She states she has been able to cut down on her cigarette smoking. She is encouraged to quit. No new problems are identified.  Patient Active Problem List   Diagnosis Date Noted  . Ovarian cyst 12/18/2016  . 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 Numbness and tingling in left hand 08/19/2016  . Back pain with right-sided sciatica 06/18/2016  . Dysmenorrhea 04/10/2016  . Muscle spasm of back 03/13/2016  . Obesity 03/13/2016  . Weight loss counseling, encounter for 03/13/2016  . Stress 02/14/2016  . Bipolar disorder (HCC) 01/15/2016  . Hiatal hernia 01/15/2016  . Migraine 01/15/2016  . Tobacco use disorder 07/10/2013  . GERD (gastroesophageal reflux disease) 07/10/2013  . HTN (hypertension) 07/08/2013  . Asthma, mild persistent 04/05/2013  . Hyperlipidemia 04/09/2011  . Depressive disorder, not elsewhere classified 03/27/2007  . Allergic rhinitis 08/16/2003    Outpatient Encounter Prescriptions as of 01/20/2017  Medication Sig  . albuterol (PROVENTIL HFA;VENTOLIN HFA) 108 (90 Base) MCG/ACT inhaler Inhale 2 puffs into the lungs every 4 (four) hours as needed.  [name]. buPROPion (WELLBUTRIN SR) 150 MG 12 hr tablet Take 1 tablet (150 mg total) by mouth 2 (two) times daily.  . cyclobenzaprine (FLEXERIL) 10 MG tablet Take 1 tablet (10 mg total) by  mouth at bedtime. for muscle spams (Patient taking differently: Take 10 mg by mouth as needed. for muscle spams)  . fluticasone (FLONASE) 50 MCG/ACT nasal spray Place 2 sprays into both nostrils as needed.   . hydrochlorothiazide (MICROZIDE) 12.5 MG capsule Take 1 capsule (12.5 mg total) by mouth daily.  [name]. ibuprofen (ADVIL,MOTRIN) 800 MG tablet TAKE (1) TABLET BY MOUTH EVERY EIGHT HOURS AS NEEDED.  [name]. ondansetron (ZOFRAN) 4 MG tablet Take 1 tablet (4 mg total) by mouth every 8 (eight) hours as needed for nausea or vomiting.  . pantoprazole (PROTONIX) 40 MG tablet Take 1 tablet (40 mg total) by mouth daily.  . [DISCONTINUED] pantoprazole (PROTONIX) 40 MG tablet Take 1 tablet (40 mg total) by mouth daily.   No facility-administered encounter medications on file as of 01/20/2017.     Allergies  Allergen Reactions  . Latex Itching  . Lisinopril Cough    coughing    Review of Systems  Constitutional: Positive for unexpected weight change. Negative for fatigue.       Weight gain  HENT: Negative for congestion and dental problem.   Eyes: Negative.  Negative for visual disturbance.  Respiratory: Negative for cough and shortness of breath.   Cardiovascular: Negative for chest pain, palpitations and leg swelling.  Gastrointestinal: Positive for abdominal pain. Negative for constipation and diarrhea.  Genitourinary: Positive for menstrual problem and vaginal bleeding. Negative for difficulty urinating.       Dysmenorrhea/dysfunctional bleeding  Musculoskeletal: Positive for back pain.       Patient  states that back in both legs.  Neurological: Negative for dizziness and headaches.  Psychiatric/Behavioral: Positive for dysphoric mood. The patient is nervous/anxious.        At times due to home and job stress    BP 130/78 (BP Location: Right Arm, Patient Position: Sitting, Cuff Size: Normal)   Pulse 100   Temp 98.5 F (36.9 C) (Temporal)   Resp 18   Ht 5\' 2"  (1.575 m)   Wt 202 lb (91.6 kg)    LMP 01/03/2017 (Exact Date)   SpO2 98%   BMI 36.95 kg/m   Physical Exam  Constitutional: She is oriented to person, place, and time. She appears well-developed and well-nourished.  Looks older than stated age  HENT:  Head: Normocephalic and atraumatic.  Mouth/Throat: Oropharynx is clear and moist.  Eyes: Conjunctivae are normal. Pupils are equal, round, and reactive to light.  Neck: Normal range of motion.  Cardiovascular: Normal rate, regular rhythm and normal heart sounds.   Pulmonary/Chest: Effort normal and breath sounds normal.  Few scattered wheeze  Abdominal:  Truncal obesity  Musculoskeletal: She exhibits no edema.  Lymphadenopathy:    She has no cervical adenopathy.  Neurological: She is alert and oriented to person, place, and time.  Skin: Skin is warm and dry.  Psychiatric: She has a normal mood and affect. Her behavior is normal.    ASSESSMENT/PLAN:  1. Essential hypertension **Controlled  2. Tobacco use disorder **Improving  3. Dysmenorrhea **Under care of GYN  4. Gastroesophageal reflux disease, esophagitis presence not specified **Symptoms improving   Patient Instructions  Drink lots of water Take the protonix as directed Continue to try to quit smoking Blood pressure is good Try to stay active  See me in 3 months       Eustace Moore, MD

## 2017-01-20 NOTE — Progress Notes (Signed)
Patient ID: Destiny Petty, female   DOB: 15-Oct-1981, 36 y.o.   MRN: [id number]   [facility]  @DATE @            Patient name: Destiny Petty MRN [id number]  Date of birth: 15-Oct-1981  CC & HPI:   Chief Complaint  Patient presents with  . Follow-up    GYN U/S done 01/08/2017     [name] [name] is a 36 y.o. female presenting today for f/u of US results and discussion of endometrial ablation. Pt had pelvic/transvaginal US for menorrhagia and dysmenorrhea. She states she has 3 very heavy bleeding and cramping and 2 days of lighter bleeding. She states her periods are not regular. Menarche at age 36. LMP 01/05/17. Pt is married with one sexual partner.   Discussion: 1. Discussed with pt risks and benefits of endometrial ablation. Details of periods as noted above.   2. IUDs tried in the past. Pt reports she experienced cramping and declines this option.   At end of discussion, pt had opportunity to ask questions and has no further questions at this time.   Specific discussion of endometrial ablation as noted above. Greater than 50% was spent in counseling and coordination of care with the patient.   Total time greater than: 15 minutes.     ROS:  ROS +menorrhagia and dysmenorrhea   Pertinent History Reviewed:   Reviewed: Significant for BTL, cesarean section  Medical         Past Medical History:  Diagnosis Date  . Allergy    SEASONAL  . 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 Anxiety   . Asthma   . Back pain with right-sided sciatica 06/18/2016  . Body mass index 36.0-36.9, adult 04/10/2016  . Body mass index 37.0-37.9, adult 03/13/2016  . Depression   . Dysmenorrhea 04/10/2016  . GERD (gastroesophageal reflux disease)   . Hemorrhoids 02/10/2015  . Hiatal hernia   . Hyperlipidemia 04/09/2011  . Hypertension   . Menorrhagia with regular cycle 04/10/2016  . Muscle spasm of back 03/13/2016  . Obesity   . Ovarian cyst 12/18/2016  . Perianal wart 02/10/2015  .  Stress 02/14/2016  . Weight loss counseling, encounter for 03/13/2016                              Surgical Hx:    Past Surgical History:  Procedure Laterality Date  . CESAREAN SECTION    . CHOLECYSTECTOMY    . COLONOSCOPY    . ESOPHAGOGASTRODUODENOSCOPY ENDOSCOPY    . TUBAL LIGATION     Medications: Reviewed & Updated - see associated section                       Current Outpatient Prescriptions:  .  albuterol (PROVENTIL HFA;VENTOLIN HFA) 108 (90 Base) MCG/ACT inhaler, Inhale 2 puffs into the lungs every 4 (four) hours as needed., Disp: 6.7 g, Rfl: 0 .  buPROPion (WELLBUTRIN SR) 150 MG 12 hr tablet, Take 1 tablet (150 mg total) by mouth 2 (two) times daily., Disp: 60 tablet, Rfl: 3 .  cyclobenzaprine (FLEXERIL) 10 MG tablet, Take 1 tablet (10 mg total) by mouth at bedtime. for muscle spams (Patient taking differently: Take 10 mg by mouth as needed. for muscle spams), Disp: 30 tablet, Rfl: 0 .  fluticasone (FLONASE) 50 MCG/ACT nasal spray, Place 2 sprays into both nostrils as needed. ,  Disp: , Rfl:  .  hydrochlorothiazide (MICROZIDE) 12.5 MG capsule, Take 1 capsule (12.5 mg total) by mouth daily., Disp: 30 capsule, Rfl: 11 .  ibuprofen (ADVIL,MOTRIN) 800 MG tablet, TAKE (1) TABLET BY MOUTH EVERY EIGHT HOURS AS NEEDED., Disp: 60 tablet, Rfl: 1 .  ondansetron (ZOFRAN) 4 MG tablet, Take 1 tablet (4 mg total) by mouth every 8 (eight) hours as needed for nausea or vomiting., Disp: 20 tablet, Rfl: 0 .  pantoprazole (PROTONIX) 40 MG tablet, Take 1 tablet (40 mg total) by mouth daily., Disp: 30 tablet, Rfl: 3   Social History: Reviewed -  reports that she has been smoking Cigarettes.  She has a 20.00 pack-year smoking history. She has never used smokeless tobacco.  Objective Findings:  Vitals: Blood pressure 132/90, pulse 74, weight 200 lb (90.7 kg), last menstrual period 01/03/2017.  Physical Examination: Discussion only   Transvaginal US 12/17/16  Uterus                      8.8 x 5.6 x 4.6 cm,  homogeneous anteverted uterus,wnl  Endometrium          14.5 mm, symmetrical, wnl  Right ovary             3 x 3.4 x 1.4 cm, wnl  Left ovary                2.4 x 2.3 x 4 cm, complex left ovarian cyst 2.5 x 1.2 x 1.9 cm    Technician Comments:  PELVIC US TA/TV: Homogenous anteverted uterus,wnl,EEC 14.5 mm,complex left ovarian cyst 2.5 x 1.2 x 1.9 cm,normal right ov,no free fluid,no pain during ultrasound,ov's appear mobile   Assessment & Plan:   A:  1. F/u for [place] results and discussion of endometrial ablation   P:  1. F/u for pre-op  2. GC/CHL prior to procedure  3. Schedule endometrial ablation     By signing my name below, I, Doreatha Martin, attest that this documentation has been prepared under the direction and in the presence of Tilda Burrow, MD. Electronically Signed: Doreatha Martin, ED Scribe. 01/20/17. 12:52 PM.  I personally performed the services described in this documentation, which was SCRIBED in my presence. The recorded information has been reviewed and considered accurate. It has been edited as necessary during review. Tilda Burrow, MD

## 2017-01-20 NOTE — Patient Instructions (Signed)
Drink lots of water Take the protonix as directed Continue to try to quit smoking Blood pressure is good Try to stay active  See me in 3 months

## 2017-01-24 ENCOUNTER — Encounter (INDEPENDENT_AMBULATORY_CARE_PROVIDER_SITE_OTHER): Payer: Self-pay

## 2017-02-11 ENCOUNTER — Encounter (INDEPENDENT_AMBULATORY_CARE_PROVIDER_SITE_OTHER): Payer: Self-pay | Admitting: Internal Medicine

## 2017-02-11 NOTE — Progress Notes (Signed)
Patient was given an appointment for 04/08/17 at 10:15am with Dorene Arerri Setzer, NP

## 2017-02-13 ENCOUNTER — Encounter: Payer: BLUE CROSS/BLUE SHIELD | Admitting: Obstetrics and Gynecology

## 2017-02-13 ENCOUNTER — Inpatient Hospital Stay (HOSPITAL_COMMUNITY): Admission: RE | Admit: 2017-02-13 | Payer: BLUE CROSS/BLUE SHIELD | Source: Ambulatory Visit

## 2017-02-17 ENCOUNTER — Other Ambulatory Visit: Payer: Self-pay | Admitting: Adult Health

## 2017-02-18 ENCOUNTER — Ambulatory Visit (HOSPITAL_COMMUNITY)
Admission: RE | Admit: 2017-02-18 | Payer: BLUE CROSS/BLUE SHIELD | Source: Ambulatory Visit | Admitting: Obstetrics and Gynecology

## 2017-02-18 ENCOUNTER — Encounter (HOSPITAL_COMMUNITY): Admission: RE | Payer: Self-pay | Source: Ambulatory Visit

## 2017-02-18 SURGERY — DILATATION & CURETTAGE/HYSTEROSCOPY WITH NOVASURE ABLATION
Anesthesia: General

## 2017-02-21 ENCOUNTER — Other Ambulatory Visit: Payer: Self-pay | Admitting: Adult Health

## 2017-03-03 ENCOUNTER — Encounter: Payer: BLUE CROSS/BLUE SHIELD | Admitting: Obstetrics and Gynecology

## 2017-03-25 ENCOUNTER — Telehealth: Payer: Self-pay | Admitting: Family Medicine

## 2017-03-25 NOTE — Telephone Encounter (Signed)
Probably. Please and

## 2017-03-25 NOTE — Telephone Encounter (Signed)
Is she to take this bid?

## 2017-03-25 NOTE — Telephone Encounter (Signed)
Patient calling on the status of her script for protonix . She was told by the pharmacist that it was too early and would need authorization from Dr. Delton See to fill.

## 2017-03-25 NOTE — Telephone Encounter (Signed)
Patient came in requesting Rx Protonix.  Patient was told to increase her dosage to 2x per day last month. She now has only 2 left.  Please call in @ [address].

## 2017-03-28 ENCOUNTER — Other Ambulatory Visit: Payer: Self-pay

## 2017-03-28 MED ORDER — PANTOPRAZOLE SODIUM 40 MG PO TBEC
40.0000 mg | DELAYED_RELEASE_TABLET | Freq: Two times a day (BID) | ORAL | 3 refills | Status: DC
Start: 2017-03-28 — End: 2018-09-16

## 2017-03-28 NOTE — Telephone Encounter (Signed)
rx done and sent to pharmacy.

## 2017-03-28 NOTE — Telephone Encounter (Signed)
Patient desperate for her Rx before the weekend.  Please call in @ [place] and let her know the status if there is an issue. [phone]

## 2017-04-08 ENCOUNTER — Ambulatory Visit (INDEPENDENT_AMBULATORY_CARE_PROVIDER_SITE_OTHER): Payer: BLUE CROSS/BLUE SHIELD | Admitting: Internal Medicine

## 2017-04-21 ENCOUNTER — Ambulatory Visit: Payer: BLUE CROSS/BLUE SHIELD | Admitting: Family Medicine

## 2017-04-22 ENCOUNTER — Ambulatory Visit: Payer: BLUE CROSS/BLUE SHIELD | Admitting: Family Medicine

## 2017-08-30 ENCOUNTER — Encounter (HOSPITAL_COMMUNITY): Payer: Self-pay | Admitting: Emergency Medicine

## 2017-08-30 ENCOUNTER — Emergency Department (HOSPITAL_COMMUNITY)
Admission: EM | Admit: 2017-08-30 | Discharge: 2017-08-30 | Disposition: A | Discharge status: Home / Self Care | Payer: Self-pay | Attending: Emergency Medicine | Admitting: Emergency Medicine

## 2017-08-30 DIAGNOSIS — N760 Acute vaginitis: Secondary | ICD-10-CM | POA: Insufficient documentation

## 2017-08-30 DIAGNOSIS — Z91040 Latex allergy status: Secondary | ICD-10-CM | POA: Insufficient documentation

## 2017-08-30 DIAGNOSIS — F17210 Nicotine dependence, cigarettes, uncomplicated: Secondary | ICD-10-CM | POA: Insufficient documentation

## 2017-08-30 DIAGNOSIS — Z79899 Other long term (current) drug therapy: Secondary | ICD-10-CM | POA: Insufficient documentation

## 2017-08-30 DIAGNOSIS — B9689 Other specified bacterial agents as the cause of diseases classified elsewhere: Secondary | ICD-10-CM | POA: Insufficient documentation

## 2017-08-30 DIAGNOSIS — I10 Essential (primary) hypertension: Secondary | ICD-10-CM | POA: Insufficient documentation

## 2017-08-30 DIAGNOSIS — J45909 Unspecified asthma, uncomplicated: Secondary | ICD-10-CM | POA: Insufficient documentation

## 2017-08-30 LAB — URINALYSIS, ROUTINE W REFLEX MICROSCOPIC
Bilirubin Urine: NEGATIVE
GLUCOSE, UA: NEGATIVE mg/dL
Hgb urine dipstick: NEGATIVE
Ketones, ur: NEGATIVE mg/dL
Leukocytes, UA: NEGATIVE
Nitrite: NEGATIVE
PH: 5 (ref 5.0–8.0)
Protein, ur: NEGATIVE mg/dL
SPECIFIC GRAVITY, URINE: 1.008 (ref 1.005–1.030)

## 2017-08-30 LAB — WET PREP, GENITAL
SPERM: NONE SEEN
TRICH WET PREP: NONE SEEN
Yeast Wet Prep HPF POC: NONE SEEN

## 2017-08-30 LAB — PREGNANCY, URINE: Preg Test, Ur: NEGATIVE

## 2017-08-30 MED ORDER — METRONIDAZOLE 500 MG PO TABS: 500.0000 mg | ORAL_TABLET | Freq: Two times a day (BID) | ORAL | 0 refills | Status: DC

## 2017-08-30 MED ORDER — STERILE WATER FOR INJECTION IJ SOLN
INTRAMUSCULAR | Status: AC
  Administered 2017-08-30: 19:00:00
  Filled 2017-08-30: qty 10

## 2017-08-30 MED ORDER — CEFTRIAXONE SODIUM 250 MG IJ SOLR
250.0000 mg | Freq: Once | INTRAMUSCULAR | Status: AC
  Administered 2017-08-30: 250 mg via INTRAMUSCULAR
  Filled 2017-08-30: qty 250

## 2017-08-30 MED ORDER — AZITHROMYCIN 250 MG PO TABS
1000.0000 mg | ORAL_TABLET | Freq: Once | ORAL | Status: AC
  Administered 2017-08-30: 1000 mg via ORAL
  Filled 2017-08-30: qty 4

## 2017-08-30 NOTE — Discharge Instructions (Signed)
Follow-up with your doctor for recheck.  Return here if needed 

## 2017-08-30 NOTE — ED Provider Notes (Signed)
[facility] DEPT Provider Note   CSN: [id number] Arrival date & time: 08/30/17  1655     History   Chief Complaint Chief Complaint  Patient presents with  . Vaginal Discharge    HPI Destiny Petty is a 36 y.o. female.  HPI   Destiny Petty is a 36 y.o. female who presents to the Emergency Department complaining of vaginal itching, discharge and odor for two weeks.  She admits to unprotected sex with her husband, but states they have been having marital issues and she is concerned that he may be cheating on her. She has self medicated with an over-the-counter 5 day treatment for yeast without relief. She is concerned that she may have an STD. She denies increased frequency or burning with urination, abdominal pain, fever, chills, genital lesions, and abnormal vaginal bleeding.  Past Medical History:  Diagnosis Date  . Allergy    SEASONAL  . 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 Anxiety   . Asthma   . Back pain with right-sided sciatica 06/18/2016  . Body mass index 36.0-36.9, adult 04/10/2016  . Body mass index 37.0-37.9, adult 03/13/2016  . Depression   . Dysmenorrhea 04/10/2016  . GERD (gastroesophageal reflux disease)   . Hemorrhoids 02/10/2015  . Hiatal hernia   . Hyperlipidemia 04/09/2011  . Hypertension   . Menorrhagia with regular cycle 04/10/2016  . Muscle spasm of back 03/13/2016  . Obesity   . Ovarian cyst 12/18/2016  . Perianal wart 02/10/2015  . Stress 02/14/2016  . Weight loss counseling, encounter for 03/13/2016    Patient Active Problem List   Diagnosis Date Noted  . Ovarian cyst 12/18/2016  . 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 Numbness and tingling in left hand 08/19/2016  . Back pain with right-sided sciatica 06/18/2016  . Menorrhagia 04/10/2016  . Dysmenorrhea 04/10/2016  . Muscle spasm of back 03/13/2016  . Obesity 03/13/2016  . Weight loss counseling, encounter for 03/13/2016  . Stress 02/14/2016  . Bipolar disorder (HCC)  01/15/2016  . Hiatal hernia 01/15/2016  . Migraine 01/15/2016  . Tobacco use disorder 07/10/2013  . GERD (gastroesophageal reflux disease) 07/10/2013  . HTN (hypertension) 07/08/2013  . Asthma, mild persistent 04/05/2013  . Hyperlipidemia 04/09/2011  . Depressive disorder, not elsewhere classified 03/27/2007  . Allergic rhinitis 08/16/2003    Past Surgical History:  Procedure Laterality Date  . CESAREAN SECTION    . CHOLECYSTECTOMY    . COLONOSCOPY    . ESOPHAGOGASTRODUODENOSCOPY ENDOSCOPY    . TUBAL LIGATION      OB History    Gravida Para Term Preterm AB Living   SAB TAB Ectopic Multiple Live Births           2       Home Medications    Prior to Admission medications   Medication Sig Start Date End Date Taking? Authorizing Provider  albuterol (PROVENTIL HFA;VENTOLIN HFA) 108 (90 Base) MCG/ACT inhaler Inhale 2 puffs into the lungs every 4 (four) hours as needed. 11/08/16   Devoria Albe, MD  buPROPion (WELLBUTRIN SR) 150 MG 12 hr tablet TAKE ONE TABLET BY MOUTH TWICE DAILY. 02/21/17   [name], NP  cyclobenzaprine (FLEXERIL) 10 MG tablet Take 1 tablet (10 mg total) by mouth at bedtime. for muscle spams Patient taking differently: Take 10 mg by mouth as needed. for muscle spams 10/29/16   Darreld Mclean, MD  fluticasone [facility]) 50 MCG/ACT nasal spray  Place 2 sprays into both nostrils as needed.  04/04/16   [provider]  hydrochlorothiazide (MICROZIDE) 12.5 MG capsule TAKE ONE CAPSULE BY MOUTH ONCE DAILY. 02/17/17   [name], NP  ibuprofen (ADVIL,MOTRIN) 800 MG tablet TAKE (1) TABLET BY MOUTH EVERY EIGHT HOURS AS NEEDED. 11/19/16   Cyril Mourning A, NP  ondansetron (ZOFRAN) 4 MG tablet Take 1 tablet (4 mg total) by mouth every 8 (eight) hours as needed for nausea or vomiting. 12/04/16   Eustace Moore, MD  pantoprazole (PROTONIX) 40 MG tablet Take 1 tablet (40 mg total) by mouth 2 (two) times daily. 03/28/17   Eustace Moore, MD    Family History Family History  Problem Relation Age of Onset  . Diabetes Mother   . Kidney failure Mother   . Early death Mother 71       diabetes complications  . Cancer Father 2       colon cancer  . Depression Sister   . Hypertension Sister   . Anxiety disorder Sister   . Other Sister        back problems  . ADD / ADHD Daughter   . Other Daughter        behavioral issues  . ODD Daughter   . Asthma Son   . Cancer Maternal Grandmother        breast  . Seizures Maternal Grandmother   . Lupus Other     Social History Social History  Substance Use Topics  . Smoking status: Current Every Day Smoker    Packs/day: 1.00    Years: 20.00    Types: Cigarettes  . Smokeless tobacco: Never Used     Comment: trying to quit  . Alcohol use Yes     Comment: occ     Allergies   Latex and Lisinopril   Review of Systems Review of Systems  Constitutional: Negative for activity change, appetite change, chills and fever.  Respiratory: Negative for chest tightness and shortness of breath.   Gastrointestinal: Negative for abdominal pain, nausea and vomiting.  Genitourinary: Positive for vaginal discharge. Negative for decreased urine volume, difficulty urinating, dysuria, flank pain, frequency, genital sores, hematuria, menstrual problem and urgency.  Musculoskeletal: Negative for back pain.  Skin: Negative for rash.  Neurological: Negative for dizziness, weakness and numbness.  Hematological: Negative for adenopathy.  Psychiatric/Behavioral: Negative for confusion.  All other systems reviewed and are negative.    Physical Exam Updated Vital Signs BP 139/64 (BP Location: Left Arm)   Pulse (!) 103   Temp 97.8 F (36.6 C) (Oral)   Resp 18   Ht  (1.549 m)   Wt 94.8 kg (209 lb)   LMP 08/02/2017   SpO2 100%   BMI 39.49 kg/m   Physical Exam  Constitutional: She is oriented to person, place, and time. She appears well-developed and well-nourished. No  distress.  HENT:  Head: Atraumatic.  Mouth/Throat: Oropharynx is clear and moist.  Cardiovascular: Normal rate and intact distal pulses.   No murmur heard. Pulmonary/Chest: Effort normal and breath sounds normal. No respiratory distress.  Abdominal: Soft. She exhibits no distension and no mass. There is no tenderness. There is no guarding.  Genitourinary: Uterus normal. There is no rash or lesion on the right labia. There is no rash or lesion on the left labia. Cervix exhibits no motion tenderness. Right adnexum displays no mass and no tenderness. Left adnexum displays no mass and no tenderness. No bleeding in the  vagina. No foreign body in the vagina. No signs of injury around the vagina. Vaginal discharge found.  Genitourinary Comments: Exam assisted by nursing.  Minimal, milky white vaginal discharge.  No genital lesion.  No CMT, no adnexal tenderness or masses.  No bleeding  Musculoskeletal: Normal range of motion.  Lymphadenopathy:       Right: No inguinal adenopathy present.       Left: No inguinal adenopathy present.  Neurological: She is alert and oriented to person, place, and time. No sensory deficit.  Skin: Skin is warm. Capillary refill takes less than 2 seconds. No rash noted.  Psychiatric: She has a normal mood and affect.  Nursing note and vitals reviewed.    ED Treatments / Results  Labs (all labs ordered are listed, but only abnormal results are displayed) Labs Reviewed  WET PREP, GENITAL - Abnormal; Notable for the following:       Result Value   Clue Cells Wet Prep HPF POC PRESENT (*)    WBC, Wet Prep HPF POC MODERATE (*)    All other components within normal limits  URINALYSIS, ROUTINE W REFLEX MICROSCOPIC - Abnormal; Notable for the following:    Color, Urine STRAW (*)    All other components within normal limits  PREGNANCY, URINE  GC/CHLAMYDIA PROBE AMP ([place]) NOT AT [facility]    EKG  EKG Interpretation None       Radiology No results  found.  Procedures Procedures (including critical care time)  Medications Ordered in ED Medications  sterile water (preservative free) injection (not administered)  cefTRIAXone (ROCEPHIN) injection 250 mg (250 mg Intramuscular Given 08/30/17 1845)  azithromycin (ZITHROMAX) tablet 1,000 mg (1,000 mg Oral Given 08/30/17 1845)     Initial Impression / Assessment and Plan / ED Course  I have reviewed the triage vital signs and the nursing notes.  Pertinent labs & imaging results that were available during my care of the patient were reviewed by me and considered in my medical decision making (see chart for details).     abd is soft, NT.  No pelvic masses or tenderness.  Symptomatic vaginal d/c and likely BV.  No concerning sx's for TOA or PID.  Treated here with rocephin and zithromax.  rx for flagyl.  return precautions discussed.  Final Clinical Impressions(s) / ED Diagnoses   Final diagnoses:  Bacterial vaginosis    New Prescriptions New Prescriptions   No medications on file     Rosey Bath 09/01/17 1754    Tilden Fossa, MD 09/03/17 1219

## 2017-08-30 NOTE — ED Triage Notes (Signed)
Pt reports having vaginal discharge with itching and slight odor x 2 weeks.  Used OTC 5 day yeast treatment with no change.  Thinks she may have an std.

## 2017-09-01 LAB — GC/CHLAMYDIA PROBE AMP (~~LOC~~) NOT AT ARMC
CHLAMYDIA, DNA PROBE: NEGATIVE
Neisseria Gonorrhea: NEGATIVE

## 2017-09-07 ENCOUNTER — Emergency Department (HOSPITAL_COMMUNITY)
Admission: EM | Admit: 2017-09-07 | Discharge: 2017-09-07 | Disposition: A | Discharge status: Home / Self Care | Payer: Self-pay | Attending: Emergency Medicine | Admitting: Emergency Medicine

## 2017-09-07 ENCOUNTER — Emergency Department (HOSPITAL_COMMUNITY): Payer: Self-pay

## 2017-09-07 ENCOUNTER — Encounter (HOSPITAL_COMMUNITY): Payer: Self-pay | Admitting: *Deleted

## 2017-09-07 DIAGNOSIS — I10 Essential (primary) hypertension: Secondary | ICD-10-CM | POA: Insufficient documentation

## 2017-09-07 DIAGNOSIS — F17210 Nicotine dependence, cigarettes, uncomplicated: Secondary | ICD-10-CM | POA: Insufficient documentation

## 2017-09-07 DIAGNOSIS — Z79899 Other long term (current) drug therapy: Secondary | ICD-10-CM | POA: Insufficient documentation

## 2017-09-07 DIAGNOSIS — J4521 Mild intermittent asthma with (acute) exacerbation: Secondary | ICD-10-CM | POA: Insufficient documentation

## 2017-09-07 MED ORDER — IPRATROPIUM-ALBUTEROL 0.5-2.5 (3) MG/3ML IN SOLN
3.0000 mL | Freq: Once | RESPIRATORY_TRACT | Status: AC
  Administered 2017-09-07: 3 mL via RESPIRATORY_TRACT
  Filled 2017-09-07: qty 3

## 2017-09-07 MED ORDER — ALBUTEROL SULFATE HFA 108 (90 BASE) MCG/ACT IN AERS: 2.0000 | INHALATION_SPRAY | Freq: Four times a day (QID) | RESPIRATORY_TRACT | 0 refills | Status: DC | PRN

## 2017-09-07 MED ORDER — PREDNISONE 50 MG PO TABS
60.0000 mg | ORAL_TABLET | Freq: Once | ORAL | Status: AC
  Administered 2017-09-07: 60 mg via ORAL
  Filled 2017-09-07: qty 1

## 2017-09-07 MED ORDER — AZITHROMYCIN 250 MG PO TABS: 250.0000 mg | ORAL_TABLET | Freq: Every day | ORAL | 0 refills | Status: DC

## 2017-09-07 MED ORDER — PREDNISONE 50 MG PO TABS: ORAL_TABLET | ORAL | 0 refills | Status: DC

## 2017-09-07 NOTE — Discharge Instructions (Signed)
Take the antibiotics and steroids as prescribed.  Stop smoking.  Follow-up with your doctor.  Return to the ED if you develop new or worsening symptoms. °

## 2017-09-07 NOTE — ED Provider Notes (Signed)
[facility]-EMERGENCY DEPT Provider Note   CSN: [id number] Arrival date & time: 09/07/17  0605     History   Chief Complaint Chief Complaint  Patient presents with  . Fever    HPI Destiny Petty is a 36 y.o. female.  Patient with history of asthma who continues to smoke presenting with a three-day history of nonproductive cough, rhinorrhea, fullness feeling in her chest with chest tightness as well as sinus pressure. States she gets this every year at this time. States she's had fevers up to 102 over the past 3 days with decreased appetite. No nausea vomiting or diarrhea. No abdominal pain. Has had sick contacts at home. Complains of facial pressure, sore throat, rhinorrhea, postnasal drip. Denies any body aches or myalgias. She's been using her albuterol inhaler every 6 hours without relief.   The history is provided by the patient.  Fever   Associated symptoms include congestion, sore throat and cough. Pertinent negatives include no vomiting and no headaches.    Past Medical History:  Diagnosis Date  . Allergy    SEASONAL  . 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 Anxiety   . Asthma   . Back pain with right-sided sciatica 06/18/2016  . Body mass index 36.0-36.9, adult 04/10/2016  . Body mass index 37.0-37.9, adult 03/13/2016  . Depression   . Dysmenorrhea 04/10/2016  . GERD (gastroesophageal reflux disease)   . Hemorrhoids 02/10/2015  . Hiatal hernia   . Hyperlipidemia 04/09/2011  . Hypertension   . Menorrhagia with regular cycle 04/10/2016  . Muscle spasm of back 03/13/2016  . Obesity   . Ovarian cyst 12/18/2016  . Perianal wart 02/10/2015  . Stress 02/14/2016  . Weight loss counseling, encounter for 03/13/2016    Patient Active Problem List   Diagnosis Date Noted  . Ovarian cyst 12/18/2016  . 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 Numbness and tingling in left hand 08/19/2016  . Back pain with right-sided sciatica 06/18/2016  . Menorrhagia 04/10/2016  . Dysmenorrhea  04/10/2016  . Muscle spasm of back 03/13/2016  . Obesity 03/13/2016  . Weight loss counseling, encounter for 03/13/2016  . Stress 02/14/2016  . Bipolar disorder (HCC) 01/15/2016  . Hiatal hernia 01/15/2016  . Migraine 01/15/2016  . Tobacco use disorder 07/10/2013  . GERD (gastroesophageal reflux disease) 07/10/2013  . HTN (hypertension) 07/08/2013  . Asthma, mild persistent 04/05/2013  . Hyperlipidemia 04/09/2011  . Depressive disorder, not elsewhere classified 03/27/2007  . Allergic rhinitis 08/16/2003    Past Surgical History:  Procedure Laterality Date  . CESAREAN SECTION    . CHOLECYSTECTOMY    . COLONOSCOPY    . ESOPHAGOGASTRODUODENOSCOPY ENDOSCOPY    . TUBAL LIGATION      OB History    Gravida Para Term Preterm AB Living   SAB TAB Ectopic Multiple Live Births           2       Home Medications    Prior to Admission medications   Medication Sig Start Date End Date Taking? Authorizing Provider  albuterol (PROVENTIL HFA;VENTOLIN HFA) 108 (90 Base) MCG/ACT inhaler Inhale 2 puffs into the lungs every 4 (four) hours as needed. 11/08/16  Yes Devoria Albe, MD  buPROPion (WELLBUTRIN SR) 150 MG 12 hr tablet TAKE ONE TABLET BY MOUTH TWICE DAILY. 02/21/17  Yes Cyril Mourning A, NP  cyclobenzaprine (FLEXERIL) 10 MG tablet Take 1 tablet (10 mg total) by mouth at bedtime.  for muscle spams Patient taking differently: Take 10 mg by mouth as needed. for muscle spams 10/29/16  Yes Darreld Mclean, MD  fluticasone [facility]) 50 MCG/ACT nasal spray Place 2 sprays into both nostrils as needed.  04/04/16  Yes [provider]  hydrochlorothiazide (MICROZIDE) 12.5 MG capsule TAKE ONE CAPSULE BY MOUTH ONCE DAILY. 02/17/17  Yes Cyril Mourning A, NP  ibuprofen (ADVIL,MOTRIN) 800 MG tablet TAKE (1) TABLET BY MOUTH EVERY EIGHT HOURS AS NEEDED. 11/19/16  Yes Cyril Mourning A, NP  ondansetron (ZOFRAN) 4 MG tablet Take 1 tablet (4 mg total) by mouth every 8 (eight) hours as  needed for nausea or vomiting. 12/04/16  Yes Eustace Moore, MD  pantoprazole (PROTONIX) 40 MG tablet Take 1 tablet (40 mg total) by mouth 2 (two) times daily. 03/28/17  Yes Eustace Moore, MD  metroNIDAZOLE (FLAGYL) 500 MG tablet Take 1 tablet (500 mg total) by mouth 2 (two) times daily. 08/30/17   Pauline Aus, PA-C    Family History Family History  Problem Relation Age of Onset  . Diabetes Mother   . Kidney failure Mother   . Early death Mother 16       diabetes complications  . Cancer Father 58       colon cancer  . Depression Sister   . Hypertension Sister   . Anxiety disorder Sister   . Other Sister        back problems  . ADD / ADHD Daughter   . Other Daughter        behavioral issues  . ODD Daughter   . Asthma Son   . Cancer Maternal Grandmother        breast  . Seizures Maternal Grandmother   . Lupus Other     Social History Social History  Substance Use Topics  . Smoking status: Current Every Day Smoker    Packs/day: 1.00    Years: 20.00    Types: Cigarettes  . Smokeless tobacco: Never Used     Comment: trying to quit  . Alcohol use Yes     Comment: occ     Allergies   Latex and Lisinopril   Review of Systems Review of Systems  Constitutional: Positive for fever. Negative for appetite change.  HENT: Positive for congestion, ear pain, rhinorrhea and sore throat.   Eyes: Negative for visual disturbance.  Respiratory: Positive for cough, chest tightness and shortness of breath.   Cardiovascular: Negative for leg swelling.  Gastrointestinal: Negative for abdominal pain, nausea and vomiting.  Genitourinary: Negative for dysuria, vaginal bleeding and vaginal discharge.  Musculoskeletal: Negative for arthralgias and myalgias.  Neurological: Negative for dizziness, weakness and headaches.  Hematological: Negative for adenopathy.    all other systems are negative except as noted in the HPI and PMH.    Physical Exam Updated Vital Signs BP (!)  134/102   Pulse 69   Resp 19   Ht  (1.549 m)   Wt 94.8 kg (209 lb)   LMP 08/05/2017   SpO2 99%   BMI 39.49 kg/m   Physical Exam  Constitutional: She is oriented to person, place, and time. She appears well-developed and well-nourished. No distress.  HENT:  Head: Normocephalic and atraumatic.  Mouth/Throat: Oropharynx is clear and moist. No oropharyngeal exudate.  Hoarse voice Frontal and maxillary sinus tenderness  Eyes: Pupils are equal, round, and reactive to light. Conjunctivae and EOM are normal.  Neck: Normal range of motion. Neck supple.  No meningismus.  Cardiovascular:  Normal rate, regular rhythm, normal heart sounds and intact distal pulses.   No murmur heard. Pulmonary/Chest: Effort normal. No respiratory distress. She has wheezes.  Scattered expiratory wheezing  Abdominal: Soft. There is no tenderness. There is no rebound and no guarding.  Musculoskeletal: Normal range of motion. She exhibits no edema or tenderness.  Neurological: She is alert and oriented to person, place, and time. No cranial nerve deficit. She exhibits normal muscle tone. Coordination normal.   5/5 strength throughout. CN 2-12 intact.Equal grip strength.   Skin: Skin is warm. Capillary refill takes less than 2 seconds.  Psychiatric: She has a normal mood and affect. Her behavior is normal.  Nursing note and vitals reviewed.    ED Treatments / Results  Labs (all labs ordered are listed, but only abnormal results are displayed) Labs Reviewed - No data to display  EKG  EKG Interpretation None       Radiology Dg Chest 2 View  Result Date: 09/07/2017 CLINICAL DATA:  Pt states she has been running a fever off AND on for the past 3 days. Non productive cough. Fills like chest is full but nothing comes up. Has been taking OTC Mucinex. EXAM: CHEST  2 VIEW COMPARISON:  11/08/2016 FINDINGS: The heart size and mediastinal contours are within normal limits. Both lungs are clear. The visualized  skeletal structures are unremarkable. IMPRESSION: No active cardiopulmonary disease. Electronically Signed   By: Norva Pavlov M.D.   On: 09/07/2017 07:16    Procedures Procedures (including critical care time)  Medications Ordered in ED Medications  ipratropium-albuterol (DUONEB) 0.5-2.5 (3) MG/3ML nebulizer solution 3 mL (not administered)  predniSONE (DELTASONE) tablet 60 mg (60 mg Oral Given 09/07/17 1610)     Initial Impression / Assessment and Plan / ED Course  I have reviewed the triage vital signs and the nursing notes.  Pertinent labs & imaging results that were available during my care of the patient were reviewed by me and considered in my medical decision making (see chart for details).     Patient with 3 days of URI symptoms including coughing, wheezing, rhinorrhea, and sinus pressure. She's wheezing on exam. Given prednisone and DuoNeb.  Patient is improved after nebulizer and steroids. Chest x-ray is negative for infiltrate.  Patient will be treated for asthma exacerbation with URI. Smoking cessation encouraged. She will be given steroid burst as well as refill of her bronchodilators. Follow-up with PCP. Return precautions discussed.  Final Clinical Impressions(s) / ED Diagnoses   Final diagnoses:  Mild intermittent asthma with exacerbation    New Prescriptions New Prescriptions   No medications on file     Glynn Octave, MD 09/07/17 [phone]

## 2017-09-07 NOTE — ED Triage Notes (Signed)
Pt states she has been running a fever off & on for the past 3 days. Non productive cough. Fills like chest is full but nothing comes up. Has been taking OTC mucinex.

## 2017-10-29 ENCOUNTER — Other Ambulatory Visit: Payer: Self-pay

## 2017-10-29 ENCOUNTER — Emergency Department (HOSPITAL_COMMUNITY)
Admission: EM | Admit: 2017-10-29 | Discharge: 2017-10-30 | Disposition: A | Discharge status: Home / Self Care | Payer: Self-pay | Attending: Emergency Medicine | Admitting: Emergency Medicine

## 2017-10-29 ENCOUNTER — Encounter (HOSPITAL_COMMUNITY): Payer: Self-pay | Admitting: Emergency Medicine

## 2017-10-29 DIAGNOSIS — Z79899 Other long term (current) drug therapy: Secondary | ICD-10-CM | POA: Insufficient documentation

## 2017-10-29 DIAGNOSIS — F17210 Nicotine dependence, cigarettes, uncomplicated: Secondary | ICD-10-CM | POA: Insufficient documentation

## 2017-10-29 DIAGNOSIS — J189 Pneumonia, unspecified organism: Secondary | ICD-10-CM

## 2017-10-29 DIAGNOSIS — Z91040 Latex allergy status: Secondary | ICD-10-CM | POA: Insufficient documentation

## 2017-10-29 DIAGNOSIS — J4521 Mild intermittent asthma with (acute) exacerbation: Secondary | ICD-10-CM | POA: Insufficient documentation

## 2017-10-29 DIAGNOSIS — J181 Lobar pneumonia, unspecified organism: Secondary | ICD-10-CM | POA: Insufficient documentation

## 2017-10-29 DIAGNOSIS — I10 Essential (primary) hypertension: Secondary | ICD-10-CM | POA: Insufficient documentation

## 2017-10-29 MED ORDER — ALBUTEROL SULFATE (2.5 MG/3ML) 0.083% IN NEBU
5.0000 mg | INHALATION_SOLUTION | Freq: Once | RESPIRATORY_TRACT | Status: AC
  Administered 2017-10-30: 5 mg via RESPIRATORY_TRACT
  Filled 2017-10-29: qty 6

## 2017-10-29 NOTE — ED Triage Notes (Signed)
Pt c/o cough, runny nose, wheezing, right ear pain x 2 weeks.

## 2017-10-30 ENCOUNTER — Emergency Department (HOSPITAL_COMMUNITY): Payer: Self-pay

## 2017-10-30 LAB — RAPID STREP SCREEN (MED CTR MEBANE ONLY): STREPTOCOCCUS, GROUP A SCREEN (DIRECT): NEGATIVE

## 2017-10-30 MED ORDER — AMOXICILLIN-POT CLAVULANATE 875-125 MG PO TABS: 1.0000 | ORAL_TABLET | Freq: Two times a day (BID) | ORAL | 0 refills | Status: DC

## 2017-10-30 MED ORDER — IPRATROPIUM-ALBUTEROL 0.5-2.5 (3) MG/3ML IN SOLN
3.0000 mL | Freq: Once | RESPIRATORY_TRACT | Status: AC
  Administered 2017-10-30: 3 mL via RESPIRATORY_TRACT
  Filled 2017-10-30: qty 3

## 2017-10-30 MED ORDER — AMOXICILLIN-POT CLAVULANATE 875-125 MG PO TABS
1.0000 | ORAL_TABLET | Freq: Once | ORAL | Status: AC
  Administered 2017-10-30: 1 via ORAL
  Filled 2017-10-30: qty 1

## 2017-10-30 MED ORDER — IBUPROFEN 800 MG PO TABS
800.0000 mg | ORAL_TABLET | Freq: Once | ORAL | Status: AC
  Administered 2017-10-30: 800 mg via ORAL

## 2017-10-30 MED ORDER — ALBUTEROL SULFATE HFA 108 (90 BASE) MCG/ACT IN AERS: 2.0000 | INHALATION_SPRAY | Freq: Four times a day (QID) | RESPIRATORY_TRACT | 0 refills | Status: DC | PRN

## 2017-10-30 MED ORDER — IBUPROFEN 800 MG PO TABS
ORAL_TABLET | ORAL | Status: AC
  Filled 2017-10-30: qty 1

## 2017-10-30 MED ORDER — PREDNISONE 50 MG PO TABS
60.0000 mg | ORAL_TABLET | Freq: Once | ORAL | Status: AC
  Administered 2017-10-30: 60 mg via ORAL
  Filled 2017-10-30: qty 1

## 2017-10-30 MED ORDER — DOXYCYCLINE HYCLATE 100 MG PO CAPS: 100.0000 mg | ORAL_CAPSULE | Freq: Two times a day (BID) | ORAL | 0 refills | Status: DC

## 2017-10-30 MED ORDER — PREDNISONE 50 MG PO TABS: ORAL_TABLET | ORAL | 0 refills | Status: DC

## 2017-10-30 MED ORDER — DOXYCYCLINE HYCLATE 100 MG PO TABS
100.0000 mg | ORAL_TABLET | Freq: Once | ORAL | Status: AC
  Administered 2017-10-30: 100 mg via ORAL
  Filled 2017-10-30: qty 1

## 2017-10-30 NOTE — ED Notes (Signed)
Patient transported to X-ray 

## 2017-10-30 NOTE — Discharge Instructions (Signed)
Take the antibiotics and steroids as prescribed.  Stop smoking.  Follow-up with your doctor.  Return to the ED if you develop new or worsening symptoms. °

## 2017-10-30 NOTE — ED Notes (Signed)
Pt ambulated halls with oxygen saturation 94-96% on room air, no increased shortness of breath.

## 2017-10-30 NOTE — ED Notes (Signed)
ED Provider at bedside. 

## 2017-10-30 NOTE — ED Provider Notes (Signed)
[facility] Provider Note   CSN: [id number] Arrival date & time: 10/29/17  2300     History   Chief Complaint Chief Complaint  Patient presents with  . Cough    HPI Destiny Petty is a 36 y.o. female.  Patient with history of asthma who continues to smoke presenting with persistent cough, runny nose, sinus pressure, facial pressure, wheezing, shortness of breath and right ear pain for 2 weeks.  States she was treated for bronchitis with antibiotics and prednisone month ago and never improved.  She reports subjective fevers up to 100.  She has been using albuterol at home without relief.  She has not been able to see her doctor.  She reports pressure in her sinuses as well as green discharge from her nose.  Her cough is nonproductive.  She has some chest tightness from coughing.  No abdominal pain, vomiting or diarrhea.  No sick contacts.  Good p.o. intake and urine output.   The history is provided by the patient.  Cough  Associated symptoms include ear pain, rhinorrhea, sore throat, myalgias and shortness of breath. Pertinent negatives include no chest pain and no headaches.    Past Medical History:  Diagnosis Date  . Allergy    SEASONAL  . 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 Anxiety   . Asthma   . Back pain with right-sided sciatica 06/18/2016  . Body mass index 36.0-36.9, adult 04/10/2016  . Body mass index 37.0-37.9, adult 03/13/2016  . Depression   . Dysmenorrhea 04/10/2016  . GERD (gastroesophageal reflux disease)   . Hemorrhoids 02/10/2015  . Hiatal hernia   . Hyperlipidemia 04/09/2011  . Hypertension   . Menorrhagia with regular cycle 04/10/2016  . Muscle spasm of back 03/13/2016  . Obesity   . Ovarian cyst 12/18/2016  . Perianal wart 02/10/2015  . Stress 02/14/2016  . Weight loss counseling, encounter for 03/13/2016    Patient Active Problem List   Diagnosis Date Noted  . Ovarian cyst 12/18/2016  . 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  Numbness and tingling in left hand 08/19/2016  . Back pain with right-sided sciatica 06/18/2016  . Menorrhagia 04/10/2016  . Dysmenorrhea 04/10/2016  . Muscle spasm of back 03/13/2016  . Obesity 03/13/2016  . Weight loss counseling, encounter for 03/13/2016  . Stress 02/14/2016  . Bipolar disorder (HCC) 01/15/2016  . Hiatal hernia 01/15/2016  . Migraine 01/15/2016  . Tobacco use disorder 07/10/2013  . GERD (gastroesophageal reflux disease) 07/10/2013  . HTN (hypertension) 07/08/2013  . Asthma, mild persistent 04/05/2013  . Hyperlipidemia 04/09/2011  . Depressive disorder, not elsewhere classified 03/27/2007  . Allergic rhinitis 08/16/2003    Past Surgical History:  Procedure Laterality Date  . CESAREAN SECTION    . CHOLECYSTECTOMY    . COLONOSCOPY    . ESOPHAGOGASTRODUODENOSCOPY ENDOSCOPY    . TUBAL LIGATION      OB History    Gravida Para Term Preterm AB Living   2 2 2     2    SAB TAB Ectopic Multiple Live Births           2       Home Medications    Prior to Admission medications   Medication Sig Start Date End Date Taking? Authorizing Provider  albuterol (PROVENTIL HFA;VENTOLIN HFA) 108 (90 Base) MCG/ACT inhaler Inhale 2 puffs into the lungs every 6 (six) hours as needed. 09/07/17   Jaanai Salemi, Jeannett SeniorStephen, MD  azithromycin (ZITHROMAX) 250 MG tablet Take  1 tablet (250 mg total) by mouth daily. Take first 2 tablets together, then 1 every day until finished. 09/07/17   Richerd Grime, Jeannett SeniorStephen, MD  buPROPion (WELLBUTRIN SR) 150 MG 12 hr tablet TAKE ONE TABLET BY MOUTH TWICE DAILY. 02/21/17   Adline PotterGriffin, Jennifer A, NP  cyclobenzaprine (FLEXERIL) 10 MG tablet Take 1 tablet (10 mg total) by mouth at bedtime. for muscle spams Patient taking differently: Take 10 mg by mouth as needed. for muscle spams 10/29/16   Darreld McleanKeeling, Wayne, MD  fluticasone [facility](FLONASE) 50 MCG/ACT nasal spray Place 2 sprays into both nostrils as needed.  04/04/16   [provider]  hydrochlorothiazide (MICROZIDE) 12.5  MG capsule TAKE ONE CAPSULE BY MOUTH ONCE DAILY. 02/17/17   Adline PotterGriffin, Jennifer A, NP  ibuprofen (ADVIL,MOTRIN) 800 MG tablet TAKE (1) TABLET BY MOUTH EVERY EIGHT HOURS AS NEEDED. 11/19/16   Adline PotterGriffin, Jennifer A, NP  metroNIDAZOLE (FLAGYL) 500 MG tablet Take 1 tablet (500 mg total) by mouth 2 (two) times daily. 08/30/17   Triplett, Tammy, PA-C  ondansetron (ZOFRAN) 4 MG tablet Take 1 tablet (4 mg total) by mouth every 8 (eight) hours as needed for nausea or vomiting. 12/04/16   Eustace MooreNelson, Yvonne Sue, MD  pantoprazole (PROTONIX) 40 MG tablet Take 1 tablet (40 mg total) by mouth 2 (two) times daily. 03/28/17   Eustace MooreNelson, Yvonne Sue, MD  predniSONE (DELTASONE) 50 MG tablet 1 tablet PO daily 09/07/17   Glynn Octaveancour, Saudia Smyser, MD    Family History Family History  Problem Relation Age of Onset  . Diabetes Mother   . Kidney failure Mother   . Early death Mother [id number]       diabetes complications  . Cancer Father [id number]       colon cancer  . Depression Sister   . Hypertension Sister   . Anxiety disorder Sister   . Other Sister        back problems  . ADD / ADHD Daughter   . Other Daughter        behavioral issues  . ODD Daughter   . Asthma Son   . Cancer Maternal Grandmother        breast  . Seizures Maternal Grandmother   . Lupus Other     Social History Social History   Tobacco Use  . Smoking status: Current Every Day Smoker    Packs/day: 1.00    Years: 20.00    Pack years: 20.00    Types: Cigarettes  . Smokeless tobacco: Never Used  . Tobacco comment: trying to quit  Substance Use Topics  . Alcohol use: Yes    Comment: occ  . Drug use: No     Allergies   Latex and Lisinopril   Review of Systems Review of Systems  Constitutional: Positive for fever. Negative for activity change and appetite change.  HENT: Positive for congestion, ear pain, facial swelling, postnasal drip, rhinorrhea, sinus pressure and sore throat.   Respiratory: Positive for cough, chest tightness and shortness of breath.    Cardiovascular: Negative for chest pain.  Gastrointestinal: Negative for abdominal pain, nausea and vomiting.  Genitourinary: Negative for dysuria and hematuria.  Musculoskeletal: Positive for arthralgias and myalgias.  Skin: Negative for rash.  Neurological: Negative for dizziness, weakness and headaches.   all other systems are negative except as noted in the HPI and PMH.     Physical Exam Updated Vital Signs BP (!) 156/92   Pulse (!) 102   Temp 98.5 F (36.9 C)   Resp 20  Ht 5\' 1"  (1.549 m)   Wt 95.3 kg (210 lb)   LMP 10/05/2017   SpO2 98%   BMI 39.68 kg/m   Physical Exam  Constitutional: She is oriented to person, place, and time. She appears well-developed and well-nourished. No distress.  Hoarse voice  HENT:  Head: Normocephalic and atraumatic.  Mouth/Throat: Oropharynx is clear and moist. No oropharyngeal exudate.  Mild OP erythema R frontal and maxillary sinus tenderness  Eyes: Conjunctivae and EOM are normal. Pupils are equal, round, and reactive to light.  Neck: Normal range of motion. Neck supple.  No meningismus.  Cardiovascular: Normal rate, regular rhythm, normal heart sounds and intact distal pulses.  No murmur heard. Pulmonary/Chest: Effort normal. No respiratory distress. She has wheezes. She exhibits no tenderness.  Scattered expiratory wheezing  Abdominal: Soft. There is no tenderness. There is no rebound and no guarding.  Musculoskeletal: Normal range of motion. She exhibits no edema or tenderness.  Lymphadenopathy:    She has cervical adenopathy.  Neurological: She is alert and oriented to person, place, and time. No cranial nerve deficit. She exhibits normal muscle tone. Coordination normal.   5/5 strength throughout. CN 2-12 intact.Equal grip strength.   Skin: Skin is warm. Capillary refill takes less than 2 seconds.  Psychiatric: She has a normal mood and affect. Her behavior is normal.  Nursing note and vitals reviewed.    ED Treatments  / Results  Labs (all labs ordered are listed, but only abnormal results are displayed) Labs Reviewed  RAPID STREP SCREEN (NOT AT [facility])  CULTURE, GROUP A STREP [facility])    EKG  EKG Interpretation None       Radiology Dg Chest 2 View  Result Date: 10/30/2017 CLINICAL DATA:  Worsening shortness of breath and cough. Facial swelling and headaches. History of asthma, hypertension, current smoker. EXAM: CHEST  2 VIEW COMPARISON:  09/07/2017 FINDINGS: Developing infiltration in the left lingula suggesting focal pneumonia. Right lung is clear and expanded. Heart size and pulmonary vascularity are normal. No pleural effusions. No pneumothorax. Mild degenerative changes in the spine. IMPRESSION: Developing infiltration in the left lingula consistent with focal pneumonia. Electronically Signed   By: Burman Nieves M.D.   On: 10/30/2017 01:48    Procedures Procedures (including critical care time)  Medications Ordered in ED Medications  ipratropium-albuterol (DUONEB) 0.5-2.5 (3) MG/3ML nebulizer solution 3 mL (not administered)  albuterol (PROVENTIL) (2.5 MG/3ML) 0.083% nebulizer solution 5 mg (5 mg Nebulization Given 10/30/17 0016)     Initial Impression / Assessment and Plan / ED Course  I have reviewed the triage vital signs and the nursing notes.  Pertinent labs & imaging results that were available during my care of the patient were reviewed by me and considered in my medical decision making (see chart for details).    Several weeks of ongoing cough, facial pain, rhinorrhea, post nasal drip. No distress.  Wheezing on exam. Nebs and steroids given.  CXR with developing lingula infiltrate. Will treat for CAP.   Wheezing and air exchange improved after treatments. Able to ambulate without desaturation.  Treat for asthma exacerbation and CAP. Antibiotics, steroids, bronchodilators. Smoking cessation encouraged. Follow up with PCP. Return precautions discussed.  Final Clinical  Impressions(s) / ED Diagnoses   Final diagnoses:  Community acquired pneumonia of left lower lobe of lung (HCC)  Mild intermittent asthma with exacerbation    ED Discharge Orders        Ordered    amoxicillin-clavulanate (AUGMENTIN) 875-125 MG tablet  Every  12 hours     10/30/17 0247    doxycycline (VIBRAMYCIN) 100 MG capsule  2 times daily     10/30/17 0247    albuterol (PROVENTIL HFA;VENTOLIN HFA) 108 (90 Base) MCG/ACT inhaler  Every 6 hours PRN     10/30/17 0247    predniSONE (DELTASONE) 50 MG tablet     10/30/17 0247       Glynn Octave, MD 10/30/17 [phone]

## 2017-11-01 LAB — CULTURE, GROUP A STREP (THRC)

## 2018-01-13 DIAGNOSIS — Z5321 Procedure and treatment not carried out due to patient leaving prior to being seen by health care provider: Secondary | ICD-10-CM | POA: Insufficient documentation

## 2018-01-13 DIAGNOSIS — R51 Headache: Secondary | ICD-10-CM | POA: Insufficient documentation

## 2018-01-14 ENCOUNTER — Other Ambulatory Visit: Payer: Self-pay

## 2018-01-14 ENCOUNTER — Emergency Department (HOSPITAL_COMMUNITY): Payer: Self-pay

## 2018-01-14 ENCOUNTER — Emergency Department (HOSPITAL_COMMUNITY)
Admission: EM | Admit: 2018-01-14 | Discharge: 2018-01-14 | Disposition: A | Discharge status: Home / Self Care | Payer: Self-pay | Attending: Emergency Medicine | Admitting: Emergency Medicine

## 2018-01-14 ENCOUNTER — Encounter (HOSPITAL_COMMUNITY): Payer: Self-pay | Admitting: Emergency Medicine

## 2018-01-14 ENCOUNTER — Encounter (HOSPITAL_COMMUNITY): Payer: Self-pay

## 2018-01-14 DIAGNOSIS — M1712 Unilateral primary osteoarthritis, left knee: Secondary | ICD-10-CM

## 2018-01-14 DIAGNOSIS — F17210 Nicotine dependence, cigarettes, uncomplicated: Secondary | ICD-10-CM | POA: Insufficient documentation

## 2018-01-14 DIAGNOSIS — Z91040 Latex allergy status: Secondary | ICD-10-CM | POA: Insufficient documentation

## 2018-01-14 DIAGNOSIS — I10 Essential (primary) hypertension: Secondary | ICD-10-CM | POA: Insufficient documentation

## 2018-01-14 DIAGNOSIS — J4530 Mild persistent asthma, uncomplicated: Secondary | ICD-10-CM | POA: Insufficient documentation

## 2018-01-14 DIAGNOSIS — R55 Syncope and collapse: Secondary | ICD-10-CM

## 2018-01-14 DIAGNOSIS — Z79899 Other long term (current) drug therapy: Secondary | ICD-10-CM | POA: Insufficient documentation

## 2018-01-14 LAB — COMPREHENSIVE METABOLIC PANEL
ALK PHOS: 74 U/L (ref 38–126)
ALT: 12 U/L — AB (ref 14–54)
ANION GAP: 8 (ref 5–15)
AST: 18 U/L (ref 15–41)
Albumin: 3.6 g/dL (ref 3.5–5.0)
BILIRUBIN TOTAL: 0.3 mg/dL (ref 0.3–1.2)
BUN: 12 mg/dL (ref 6–20)
CALCIUM: 8.7 mg/dL — AB (ref 8.9–10.3)
CO2: 24 mmol/L (ref 22–32)
CREATININE: 1 mg/dL (ref 0.44–1.00)
Chloride: 107 mmol/L (ref 101–111)
GFR calc non Af Amer: >60 mL/min (ref >60)
Glucose, Bld: 82 mg/dL (ref 65–99)
Potassium: 3.8 mmol/L (ref 3.5–5.1)
SODIUM: 139 mmol/L (ref 135–145)
Total Protein: 6.6 g/dL (ref 6.5–8.1)

## 2018-01-14 LAB — URINALYSIS, ROUTINE W REFLEX MICROSCOPIC
BILIRUBIN URINE: NEGATIVE
GLUCOSE, UA: NEGATIVE mg/dL
HGB URINE DIPSTICK: NEGATIVE
Ketones, ur: NEGATIVE mg/dL
Leukocytes, UA: NEGATIVE
Nitrite: NEGATIVE
PH: 6 (ref 5.0–8.0)
Protein, ur: NEGATIVE mg/dL
SPECIFIC GRAVITY, URINE: 1.023 (ref 1.005–1.030)

## 2018-01-14 LAB — CBC WITH DIFFERENTIAL/PLATELET
Basophils Absolute: 0 10*3/uL (ref 0.0–0.1)
Basophils Relative: 0 %
EOS ABS: 0.5 10*3/uL (ref 0.0–0.7)
Eosinophils Relative: 6 %
HEMATOCRIT: 41.9 % (ref 36.0–46.0)
HEMOGLOBIN: 13.4 g/dL (ref 12.0–15.0)
LYMPHS ABS: 2.3 10*3/uL (ref 0.7–4.0)
LYMPHS PCT: 30 %
MCH: 31.9 pg (ref 26.0–34.0)
MCHC: 32 g/dL (ref 30.0–36.0)
MCV: 99.8 fL (ref 78.0–100.0)
MONOS PCT: 9 %
Monocytes Absolute: 0.7 10*3/uL (ref 0.1–1.0)
NEUTROS PCT: 55 %
Neutro Abs: 4.3 10*3/uL (ref 1.7–7.7)
Platelets: 236 10*3/uL (ref 150–400)
RBC: 4.2 MIL/uL (ref 3.87–5.11)
RDW: 12.8 % (ref 11.5–15.5)
WBC: 7.8 10*3/uL (ref 4.0–10.5)

## 2018-01-14 LAB — POC URINE PREG, ED: Preg Test, Ur: NEGATIVE

## 2018-01-14 MED ORDER — METHYLPREDNISOLONE SODIUM SUCC 125 MG IJ SOLR
125.0000 mg | Freq: Once | INTRAMUSCULAR | Status: AC
  Administered 2018-01-14: 125 mg via INTRAVENOUS
  Filled 2018-01-14: qty 2

## 2018-01-14 MED ORDER — ONDANSETRON HCL 4 MG/2ML IJ SOLN
4.0000 mg | Freq: Once | INTRAMUSCULAR | Status: AC
  Administered 2018-01-14: 4 mg via INTRAVENOUS
  Filled 2018-01-14: qty 2

## 2018-01-14 MED ORDER — SODIUM CHLORIDE 0.9 % IV BOLUS (SEPSIS)
1000.0000 mL | Freq: Once | INTRAVENOUS | Status: AC
Start: 2018-01-14 — End: 2018-01-14
  Administered 2018-01-14: 1000 mL via INTRAVENOUS

## 2018-01-14 MED ORDER — DEXAMETHASONE 4 MG PO TABS: 4.0000 mg | ORAL_TABLET | Freq: Two times a day (BID) | ORAL | 0 refills | Status: DC

## 2018-01-14 MED ORDER — KETOROLAC TROMETHAMINE 30 MG/ML IJ SOLN
30.0000 mg | Freq: Once | INTRAMUSCULAR | Status: AC
  Administered 2018-01-14: 30 mg via INTRAVENOUS
  Filled 2018-01-14: qty 1

## 2018-01-14 MED ORDER — SODIUM CHLORIDE 0.9 % IV SOLN: 1000.0000 mL | INTRAVENOUS | Status: DC

## 2018-01-14 NOTE — ED Notes (Signed)
Pt also c/o bilateral ear pain and pressure.

## 2018-01-14 NOTE — Discharge Instructions (Signed)
Your workup for the passing out today showed mild changes in your blood pressure from lying and sitting to standing.  He was given IV fluids in the emergency room today. Please increase NON-CAFFEINE liquids.  Please see Dr. Delton SeeNelson to complete the workup.  The x-ray of your knee shows arthritis and spurs/osteophytes in multiple compartments of your knee.  Please use your knee sleeve and crutches until seen by Dr. Romeo AppleHarrison, or the orthopedic specialist of your choice.  There is no fracture and no dislocation.  Please use 600mg  of ibuprofen with breakfast, lunch, dinner, and at bedtime.  Please use Decadron 2 times daily with food.

## 2018-01-14 NOTE — ED Provider Notes (Signed)
[facility] EMERGENCY DEPARTMENT Provider Note   CSN: [id number] Arrival date & time: 01/14/18  4098     History   Chief Complaint Chief Complaint  Patient presents with  . Loss of Consciousness  . Knee Pain    HPI Destiny Petty is a 37 y.o. female.  Patient is a 37 year old female who presents to the emergency department with complaint of passing out and knee pain.  Patient has a history of hypertension, hyperlipidemia, anxiety, and depression.  Patient states that back in October she had an episode in which she "passed out".  She did not see a primary physician because of her insurance status.  She states that 2 days ago she had come in from work and was cooking in the kitchen.  She began to not feel well, and felt as though she would pass out again.  She was attempting to walk to her door to get some fresh air when she passed out.  She states that she fell on her knee.  She had been having pains in her knee from time to time, but the pain was worse after this fall.  She feels nauseated from time to time and is not sure if it is related to the knee pain or not.  She has not had any injury to the head or neck.  She has been eating regularly.  She has not had excessive diarrhea or vomiting.  She has not noticed any blood loss in the stools.  She has not had any recent changes in her medications, and she is not been out of the country recently.  There is been no recent fever or chills to be reported and she does not feel like she has the flu.   The history is provided by the patient.  Loss of Consciousness   Pertinent negatives include abdominal pain, back pain, chest pain, confusion, dizziness, fever, palpitations and seizures.  Knee Pain      Past Medical History:  Diagnosis Date  . Allergy    SEASONAL  . 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 Anxiety   . Asthma   . Back pain with right-sided sciatica 06/18/2016  . Body mass index 36.0-36.9, adult 04/10/2016  . Body mass index  37.0-37.9, adult 03/13/2016  . Depression   . Dysmenorrhea 04/10/2016  . GERD (gastroesophageal reflux disease)   . Hemorrhoids 02/10/2015  . Hiatal hernia   . Hyperlipidemia 04/09/2011  . Hypertension   . Menorrhagia with regular cycle 04/10/2016  . Muscle spasm of back 03/13/2016  . Obesity   . Ovarian cyst 12/18/2016  . Perianal wart 02/10/2015  . Stress 02/14/2016  . Weight loss counseling, encounter for 03/13/2016    Patient Active Problem List   Diagnosis Date Noted  . Ovarian cyst 12/18/2016  . 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 Numbness and tingling in left hand 08/19/2016  . Back pain with right-sided sciatica 06/18/2016  . Menorrhagia 04/10/2016  . Dysmenorrhea 04/10/2016  . Muscle spasm of back 03/13/2016  . Obesity 03/13/2016  . Weight loss counseling, encounter for 03/13/2016  . Stress 02/14/2016  . Bipolar disorder (HCC) 01/15/2016  . Hiatal hernia 01/15/2016  . Migraine 01/15/2016  . Tobacco use disorder 07/10/2013  . GERD (gastroesophageal reflux disease) 07/10/2013  . HTN (hypertension) 07/08/2013  . Asthma, mild persistent 04/05/2013  . Hyperlipidemia 04/09/2011  . Depressive disorder, not elsewhere classified 03/27/2007  . Allergic rhinitis 08/16/2003    Past Surgical History:  Procedure Laterality Date  .  CESAREAN SECTION    . CHOLECYSTECTOMY    . COLONOSCOPY    . ESOPHAGOGASTRODUODENOSCOPY ENDOSCOPY    . TUBAL LIGATION      OB History    Gravida Para Term Preterm AB Living   2 2 2     2    SAB TAB Ectopic Multiple Live Births           2       Home Medications    Prior to Admission medications   Medication Sig Start Date End Date Taking? Authorizing Provider  albuterol (PROVENTIL HFA;VENTOLIN HFA) 108 (90 Base) MCG/ACT inhaler Inhale 2 puffs into the lungs every 6 (six) hours as needed. 10/30/17   Rancour, Jeannett Senior, MD  amoxicillin-clavulanate (AUGMENTIN) 875-125 MG tablet Take 1 tablet by mouth every 12 (twelve) hours. 10/30/17    Rancour, Jeannett Senior, MD  azithromycin (ZITHROMAX) 250 MG tablet Take 1 tablet (250 mg total) by mouth daily. Take first 2 tablets together, then 1 every day until finished. 09/07/17   Rancour, Jeannett Senior, MD  buPROPion (WELLBUTRIN SR) 150 MG 12 hr tablet TAKE ONE TABLET BY MOUTH TWICE DAILY. 02/21/17   [name], NP  cyclobenzaprine (FLEXERIL) 10 MG tablet Take 1 tablet (10 mg total) by mouth at bedtime. for muscle spams Patient taking differently: Take 10 mg by mouth as needed. for muscle spams 10/29/16   Darreld Mclean, MD  doxycycline (VIBRAMYCIN) 100 MG capsule Take 1 capsule (100 mg total) by mouth 2 (two) times daily. 10/30/17   Rancour, Jeannett Senior, MD  fluticasone (FLONASE) 50 MCG/ACT nasal spray Place 2 sprays into both nostrils as needed.  04/04/16   [provider]  hydrochlorothiazide (MICROZIDE) 12.5 MG capsule TAKE ONE CAPSULE BY MOUTH ONCE DAILY. 02/17/17   [name], NP  ibuprofen (ADVIL,MOTRIN) 800 MG tablet TAKE (1) TABLET BY MOUTH EVERY EIGHT HOURS AS NEEDED. 11/19/16   [name], NP  metroNIDAZOLE (FLAGYL) 500 MG tablet Take 1 tablet (500 mg total) by mouth 2 (two) times daily. 08/30/17   Triplett, Tammy, PA-C  ondansetron (ZOFRAN) 4 MG tablet Take 1 tablet (4 mg total) by mouth every 8 (eight) hours as needed for nausea or vomiting. 12/04/16   Eustace Moore, MD  pantoprazole (PROTONIX) 40 MG tablet Take 1 tablet (40 mg total) by mouth 2 (two) times daily. 03/28/17   Eustace Moore, MD  predniSONE (DELTASONE) 50 MG tablet 1 tablet PO daily 10/30/17   Glynn Octave, MD    Family History Family History  Problem Relation Age of Onset  . Diabetes Mother   . Kidney failure Mother   . Early death Mother 72       diabetes complications  . Cancer Father 38       colon cancer  . Depression Sister   . Hypertension Sister   . Anxiety disorder Sister   . Other Sister        back problems  . ADD / ADHD Daughter   . Other Daughter         behavioral issues  . ODD Daughter   . Asthma Son   . Cancer Maternal Grandmother        breast  . Seizures Maternal Grandmother   . Lupus Other     Social History Social History   Tobacco Use  . Smoking status: Current Every Day Smoker    Packs/day: 1.00    Years: 20.00    Pack years: 20.00    Types: Cigarettes  .  Smokeless tobacco: Never Used  . Tobacco comment: trying to quit  Substance Use Topics  . Alcohol use: Yes    Comment: occ  . Drug use: No     Allergies   Latex and Lisinopril   Review of Systems Review of Systems  Constitutional: Negative for activity change, appetite change, chills and fever.       All ROS Neg except as noted in HPI  HENT: Negative for nosebleeds.   Eyes: Negative for photophobia and discharge.  Respiratory: Negative for cough, shortness of breath and wheezing.   Cardiovascular: Positive for syncope. Negative for chest pain and palpitations.  Gastrointestinal: Negative for abdominal pain and blood in stool.  Genitourinary: Negative for dysuria, frequency and hematuria.  Musculoskeletal: Positive for arthralgias. Negative for back pain and neck pain.  Skin: Negative.   Neurological: Positive for syncope. Negative for dizziness, seizures and speech difficulty.  Psychiatric/Behavioral: Negative for confusion and hallucinations.     Physical Exam Updated Vital Signs BP 132/90 (BP Location: Left Arm)   Pulse 89   Temp 98.4 F (36.9 C) (Oral)   Resp 18   Ht 5\' 1"  (1.549 m)   Wt 88.5 kg (195 lb)   LMP 12/25/2017   SpO2 99%   BMI 36.84 kg/m   Physical Exam  Constitutional: She appears well-developed and well-nourished. No distress.  HENT:  Head: Normocephalic and atraumatic.  Right Ear: External ear normal.  Left Ear: External ear normal.  Eyes: Conjunctivae are normal. Right eye exhibits no discharge. Left eye exhibits no discharge. No scleral icterus.  Neck: Neck supple. No tracheal deviation present.  Cardiovascular: Normal  rate.  Pulmonary/Chest: Effort normal. No stridor. No respiratory distress.  Abdominal: She exhibits no distension.  Musculoskeletal: She exhibits tenderness. She exhibits no edema.       Left knee: She exhibits decreased range of motion. She exhibits no swelling and no effusion. Tenderness found. Medial joint line tenderness noted.  Pain with attempted flexion or extension of the left knee.  No effusion appreciated.  Pain with palpation and attempted movement of the patella.  No hematoma noted.  No deformity of the tibial area.  Achilles tendon is intact.  Dorsalis pedis pulses 2+ bilaterally.  Neurological: She is alert. She has normal strength. No cranial nerve deficit (no gross deficits) or sensory deficit. Coordination normal. GCS eye subscore is 4. GCS verbal subscore is 5. GCS motor subscore is 6.  Skin: Skin is warm and dry. No rash noted.  Psychiatric: She has a normal mood and affect.  Nursing note and vitals reviewed.    ED Treatments / Results  Labs (all labs ordered are listed, but only abnormal results are displayed) Labs Reviewed  CBC WITH DIFFERENTIAL/PLATELET  COMPREHENSIVE METABOLIC PANEL  URINALYSIS, ROUTINE W REFLEX MICROSCOPIC  POC URINE PREG, ED    EKG  EKG Interpretation None       Radiology No results found.  Procedures Procedures (including critical care time)  Medications Ordered in ED Medications - No data to display   Initial Impression / Assessment and Plan / ED Course  I have reviewed the triage vital signs and the nursing notes.  Pertinent labs & imaging results that were available during my care of the patient were reviewed by me and considered in my medical decision making (see chart for details).       Final Clinical Impressions(s) / ED Diagnoses mdm Vital signs reviewed.  Patient is awake and alert and in no distress at this  time.  No syncopal episodes noted during the emergency department visit.  There are mild orthostatic  changes noted between sitting and supine blood pressures versus standing blood pressure.  Patient will be given a liter of IV fluids.  Electrocardiogram shows a normal sinus rhythm.  There are no high degree blocks or life-threatening arrhythmias appreciated.  The complete blood count is well within normal limits.  The comprehensive metabolic panel is well within normal limits.  Urine pregnancy test is negative.  Urine urinalysis is negative for infection or signs of kidney stone. X-ray of the left knee shows no evidence of fracture, dislocation, or joint effusion present.  There are osteophytes noted in the medial femoral tibial area as well as the lateral patella femoral area.  I have instructed the patient's on the findings on examination, as well as the findings on the lab tests and x-rays.  The plan going forward is for the patient to see Dr. Delton See for continuation of the workup concerning her passing out episodes.  The patient is referred to Dr. Romeo Apple for orthopedic evaluation concerning the advancing arthritis of her knee.  The patient is fitted with a knee sleeve and crutches.  She will use these until seen by Dr. Romeo Apple, or the orthopedic specialist of her choice.  Questions were answered.  Patient is in agreement with this plan.   Final diagnoses:  Syncope and collapse  Primary osteoarthritis of left knee    ED Discharge Orders        Ordered    dexamethasone (DECADRON) 4 MG tablet  2 times daily with meals     01/14/18 1026       Ivery Quale, PA-C 01/14/18 1054    [name], MD 01/14/18 1245

## 2018-01-14 NOTE — ED Triage Notes (Signed)
Pt reports has had 2 syncopal episodes over the past 4 months.  Reports last episode was Monday.  Pt says she passed out in the kitchen and fell on knees. c/o pain and swelling in left knee.  Pt says feels nauseated.

## 2018-01-14 NOTE — ED Triage Notes (Signed)
Pt states she had syncopal episode 01/04/18 and since then c/o headaches and knee pain.

## 2018-02-16 ENCOUNTER — Encounter: Payer: Self-pay | Admitting: Family Medicine

## 2018-03-20 ENCOUNTER — Encounter (HOSPITAL_COMMUNITY): Payer: Self-pay | Admitting: Emergency Medicine

## 2018-03-20 ENCOUNTER — Other Ambulatory Visit: Payer: Self-pay

## 2018-03-20 ENCOUNTER — Emergency Department (HOSPITAL_COMMUNITY)
Admission: EM | Admit: 2018-03-20 | Discharge: 2018-03-20 | Disposition: A | Discharge status: Home / Self Care | Payer: Self-pay | Attending: Emergency Medicine | Admitting: Emergency Medicine

## 2018-03-20 DIAGNOSIS — M25562 Pain in left knee: Secondary | ICD-10-CM | POA: Insufficient documentation

## 2018-03-20 DIAGNOSIS — J45909 Unspecified asthma, uncomplicated: Secondary | ICD-10-CM | POA: Insufficient documentation

## 2018-03-20 DIAGNOSIS — I10 Essential (primary) hypertension: Secondary | ICD-10-CM | POA: Insufficient documentation

## 2018-03-20 DIAGNOSIS — Z79899 Other long term (current) drug therapy: Secondary | ICD-10-CM | POA: Insufficient documentation

## 2018-03-20 DIAGNOSIS — Z91040 Latex allergy status: Secondary | ICD-10-CM | POA: Insufficient documentation

## 2018-03-20 DIAGNOSIS — F17210 Nicotine dependence, cigarettes, uncomplicated: Secondary | ICD-10-CM | POA: Insufficient documentation

## 2018-03-20 MED ORDER — CYCLOBENZAPRINE HCL 10 MG PO TABS: 10.0000 mg | ORAL_TABLET | Freq: Two times a day (BID) | ORAL | 0 refills | Status: DC | PRN

## 2018-03-20 MED ORDER — HYDROCODONE-ACETAMINOPHEN 5-325 MG PO TABS
1.0000 | ORAL_TABLET | Freq: Once | ORAL | Status: AC
  Administered 2018-03-20: 1 via ORAL
  Filled 2018-03-20: qty 1

## 2018-03-20 MED ORDER — PREDNISONE 10 MG PO TABS: 40.0000 mg | ORAL_TABLET | Freq: Every day | ORAL | 0 refills | Status: AC

## 2018-03-20 MED ORDER — PREDNISONE 20 MG PO TABS
60.0000 mg | ORAL_TABLET | Freq: Once | ORAL | Status: AC
  Administered 2018-03-20: 60 mg via ORAL
  Filled 2018-03-20: qty 3

## 2018-03-20 NOTE — ED Provider Notes (Signed)
[facility]-EMERGENCY DEPT Provider Note   CSN: [id number] Arrival date & time: 03/20/18  1951     History   Chief Complaint Chief Complaint  Patient presents with  . Knee Pain    HPI Destiny Petty is a 37 y.o. female with history of seasonal allergies, asthma, HLD, HTN, muscle spasms presents today for evaluation of acute onset, waxing and waning and progressively worsening left knee pain.  She states that 2 months ago she fell on her left knee.  She states that she has been experiencing pain in that left knee intermittently for some time but it significantly worsened after the fall.  The pain is constant and throbbing with occasional shooting pains.  Pain will sometimes radiate down the leg and up the thigh.  Pain worsens with ambulation, prolonged standing, and flexion of the knee.  She states that 3 days ago the pain acutely worsened.  She notes intermittent swelling of the knee anteriorly and laterally.  She has been applying BenGay, icy hot, heat, and ice without significant relief of her symptoms.  She was seen and evaluated for her symptoms on 01/14/18 and underwent x-ray which showed medial femorotibial compartment and lateral patellofemoral compartment osteophytes.  She was given a knee sleeve and crutches and referral to an orthopedist.  She states she has not followed up with orthopedist due to financial constraints.  She denies any recent travel or surgeries, no prior history of DVT or PE, she is not on OCPs or hormone replacement therapy.  HPI  Past Medical History:  Diagnosis Date  . Allergy    SEASONAL  . 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 Anxiety   . Asthma   . Back pain with right-sided sciatica 06/18/2016  . Body mass index 36.0-36.9, adult 04/10/2016  . Body mass index 37.0-37.9, adult 03/13/2016  . Depression   . Dysmenorrhea 04/10/2016  . GERD (gastroesophageal reflux disease)   . Hemorrhoids 02/10/2015  . Hiatal hernia   . Hyperlipidemia 04/09/2011    . Hypertension   . Menorrhagia with regular cycle 04/10/2016  . Muscle spasm of back 03/13/2016  . Obesity   . Ovarian cyst 12/18/2016  . Perianal wart 02/10/2015  . Stress 02/14/2016  . Weight loss counseling, encounter for 03/13/2016    Patient Active Problem List   Diagnosis Date Noted  . Ovarian cyst 12/18/2016  . 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 Numbness and tingling in left hand 08/19/2016  . Back pain with right-sided sciatica 06/18/2016  . Menorrhagia 04/10/2016  . Dysmenorrhea 04/10/2016  . Muscle spasm of back 03/13/2016  . Obesity 03/13/2016  . Weight loss counseling, encounter for 03/13/2016  . Stress 02/14/2016  . Bipolar disorder (HCC) 01/15/2016  . Hiatal hernia 01/15/2016  . Migraine 01/15/2016  . Tobacco use disorder 07/10/2013  . GERD (gastroesophageal reflux disease) 07/10/2013  . HTN (hypertension) 07/08/2013  . Asthma, mild persistent 04/05/2013  . Hyperlipidemia 04/09/2011  . Depressive disorder, not elsewhere classified 03/27/2007  . Allergic rhinitis 08/16/2003    Past Surgical History:  Procedure Laterality Date  . CESAREAN SECTION    . CHOLECYSTECTOMY    . COLONOSCOPY    . ESOPHAGOGASTRODUODENOSCOPY ENDOSCOPY    . TUBAL LIGATION       OB History    Gravida  2   Para  2   Term  2   Preterm      AB      Living  2  SAB      TAB      Ectopic      Multiple      Live Births  2            Home Medications    Prior to Admission medications   Medication Sig Start Date End Date Taking? Authorizing Provider  albuterol (PROVENTIL HFA;VENTOLIN HFA) 108 (90 Base) MCG/ACT inhaler Inhale 2 puffs into the lungs every 6 (six) hours as needed. 10/30/17   Rancour, Jeannett Senior, MD  amoxicillin-clavulanate (AUGMENTIN) 875-125 MG tablet Take 1 tablet by mouth every 12 (twelve) hours. 10/30/17   Rancour, Jeannett Senior, MD  azithromycin (ZITHROMAX) 250 MG tablet Take 1 tablet (250 mg total) by mouth daily. Take first 2 tablets  together, then 1 every day until finished. 09/07/17   Rancour, Jeannett Senior, MD  buPROPion (WELLBUTRIN SR) 150 MG 12 hr tablet TAKE ONE TABLET BY MOUTH TWICE DAILY. 02/21/17   [name], NP  cyclobenzaprine (FLEXERIL) 10 MG tablet Take 1 tablet (10 mg total) by mouth 2 (two) times daily as needed for muscle spasms. 03/20/18   Taran Hable A, PA-C  dexamethasone (DECADRON) 4 MG tablet Take 1 tablet (4 mg total) by mouth 2 (two) times daily with a meal. 01/14/18   Ivery Quale, PA-C  doxycycline (VIBRAMYCIN) 100 MG capsule Take 1 capsule (100 mg total) by mouth 2 (two) times daily. 10/30/17   Rancour, Jeannett Senior, MD  fluticasone (FLONASE) 50 MCG/ACT nasal spray Place 2 sprays into both nostrils as needed.  04/04/16   [provider]  hydrochlorothiazide (MICROZIDE) 12.5 MG capsule TAKE ONE CAPSULE BY MOUTH ONCE DAILY. 02/17/17   [name], NP  ibuprofen (ADVIL,MOTRIN) 800 MG tablet TAKE (1) TABLET BY MOUTH EVERY EIGHT HOURS AS NEEDED. 11/19/16   [name], NP  metroNIDAZOLE (FLAGYL) 500 MG tablet Take 1 tablet (500 mg total) by mouth 2 (two) times daily. 08/30/17   Triplett, Tammy, PA-C  ondansetron (ZOFRAN) 4 MG tablet Take 1 tablet (4 mg total) by mouth every 8 (eight) hours as needed for nausea or vomiting. 12/04/16   Eustace Moore, MD  pantoprazole (PROTONIX) 40 MG tablet Take 1 tablet (40 mg total) by mouth 2 (two) times daily. 03/28/17   Eustace Moore, MD  predniSONE (DELTASONE) 10 MG tablet Take 4 tablets (40 mg total) by mouth daily with breakfast for 5 days. 03/20/18 03/25/18  Jeanie Sewer, PA-C    Family History Family History  Problem Relation Age of Onset  . Diabetes Mother   . Kidney failure Mother   . Early death Mother 38       diabetes complications  . Cancer Father 69       colon cancer  . Depression Sister   . Hypertension Sister   . Anxiety disorder Sister   . Other Sister        back problems  . ADD / ADHD Daughter   . Other Daughter         behavioral issues  . ODD Daughter   . Asthma Son   . Cancer Maternal Grandmother        breast  . Seizures Maternal Grandmother   . Lupus Other     Social History Social History   Tobacco Use  . Smoking status: Current Every Day Smoker    Packs/day: 1.00    Years: 20.00    Pack years: 20.00    Types: Cigarettes  . Smokeless tobacco: Never Used  .  Tobacco comment: trying to quit  Substance Use Topics  . Alcohol use: Yes    Comment: occ  . Drug use: No     Allergies   Latex and Lisinopril   Review of Systems Review of Systems  Constitutional: Negative for chills and fever.  Musculoskeletal: Positive for arthralgias.  Neurological: Positive for numbness (intermittent, none today). Negative for syncope and weakness.     Physical Exam Updated Vital Signs BP 119/76 (BP Location: Left Arm)   Pulse 83   Temp 98.4 F (36.9 C) (Oral)   Resp 16   Ht 5\' 1"  (1.549 m)   Wt 81.6 kg (180 lb)   SpO2 99%   BMI 34.01 kg/m   Physical Exam  Constitutional: She appears well-developed and well-nourished. No distress.  HENT:  Head: Normocephalic and atraumatic.  Eyes: Conjunctivae are normal. Right eye exhibits no discharge. Left eye exhibits no discharge.  Neck: No JVD present. No tracheal deviation present.  Cardiovascular: Normal rate and intact distal pulses.  2+ DP/PT pulses bilaterally, Homans sign absent bilaterally, no lower extremity edema.  No palpable cords.  Calves measure 36 cm bilaterally at the widest point.  Pulmonary/Chest: Effort normal.  Abdominal: She exhibits no distension.  Musculoskeletal: She exhibits tenderness. She exhibits no edema.  Mildly decreased range of motion of the left knee with flexion but able to flex and extend the extremity against gravity without difficulty.  Negative anterior/posterior drawer test, no varus or valgus instability.  There is some soft tissue swelling overlying the left knee laterally and anteriorly.  No erythema or  significant warmth as compared to the right side.  No tendinous abnormalities/deformity.  Maximally tender to palpation overlying the patella and the medial and lateral joint lines.  5/5 strength of BLE major muscle groups  Neurological: She is alert.  Fluent speech with no evidence of dysarthria or aphasia, no facial droop, sensation intact to soft touch of bilateral lower extremities.  Antalgic gait and patient is able to Heel Walk and Toe Walk although it is painful.  Skin: Skin is warm and dry. No erythema.  Psychiatric: She has a normal mood and affect. Her behavior is normal.  Nursing note and vitals reviewed.    ED Treatments / Results  Labs (all labs ordered are listed, but only abnormal results are displayed) Labs Reviewed - No data to display  EKG None  Radiology No results found.  Procedures Procedures (including critical care time)  Medications Ordered in ED Medications  HYDROcodone-acetaminophen (NORCO/VICODIN) 5-325 MG per tablet 1 tablet (1 tablet Oral Given 03/20/18 2117)  predniSONE (DELTASONE) tablet 60 mg (60 mg Oral Given 03/20/18 2117)     Initial Impression / Assessment and Plan / ED Course  I have reviewed the triage vital signs and the nursing notes.  Pertinent labs & imaging results that were available during my care of the patient were reviewed by me and considered in my medical decision making (see chart for details).     Patient with complaint of ongoing left knee pain after fall in February.  She is afebrile, vital signs are stable (initially mildly hypertensive in the ED with resolution on reevaluation.  I think this is likely in response to her pain).  She is nontoxic in appearance.  Pain acutely worsened 3 days ago.  No focal neurologic deficits on examination.  She is ambulatory without difficulty although it is painful.  X-rays done in February showed osteoarthritis and bone spurs.  Examination is not concerning for  septic joint, gout,  osteomyelitis, or DVT.  Suspect flare of her osteoarthritis.  Will discharge with prednisone burst and muscle relaxer to help her sleep at night.  RICE therapy indicated and discussed. She was given 1 tablet of pain medicine in the ED with improvement in her pain.  I encouraged her to follow-up with an orthopedic physician for reevaluation and possible intra-articular joint injection versus potential arthroscopy at some point.  She states that she has difficulty following up with specialists due to not having health insurance but states she will be eligible to apply for health insurance in November.  I encouraged her to follow-up with [facility] in the meantime to establish further primary care and follow-up of her knee pain.  Discussed strict ED return precautions.  Patient and patient's friend verbalized understanding of and agreement with plan and patient is stable for discharge home at this time.  Final Clinical Impressions(s) / ED Diagnoses   Final diagnoses:  Acute pain of left knee    ED Discharge Orders        Ordered    predniSONE (DELTASONE) 10 MG tablet  Daily with breakfast     03/20/18 2038    cyclobenzaprine (FLEXERIL) 10 MG tablet  2 times daily PRN     03/20/18 2040       Jeanie Sewer, PA-C 03/20/18 2145    Arby Barrette, MD 03/23/18 [phone]

## 2018-03-20 NOTE — Discharge Instructions (Addendum)
1. Medications: Start taking prednisone as prescribed beginning tomorrow.  You received the first dose in the emergency department today.  You may take [phone] mg of Tylenol every 6 hours while taking the prednisone for additional pain relief.  Do not take ibuprofen, Motrin, Aleve, or Advil while taking the prednisone.  When you are done taking the prednisone if you are still having pain you may alternate 600 mg of ibuprofen and [phone] mg of Tylenol every 3 hours as needed for pain. Do not exceed 4000 mg of Tylenol daily.  You may take Flexeril as needed for muscle relaxation but do not drive, drink alcohol, or operate heavy machinery while taking this medication as it may make you drowsy.  I typically recommend only taking this medication at night to help you sleep.  You may also cut these tablets in half if you find that they make you very sleepy. 2. Treatment: rest, ice, elevate and use brace/ace wrap, use crutches when walking to avoid weight bearing, drink plenty of fluids, gentle stretching (I have attached exercises) 3. Follow Up: Please followup with orthopedics as directed or your PCP in 1 week if no improvement for discussion of your diagnoses and further evaluation after today's visit; if you do not have a primary care doctor use the resource guide provided to find one; I have attached information for [facility], I recommend that you call them on Monday and tell them you were referred from the emergency department to schedule a follow-up appointment.  Please return to the ER for worsening symptoms or other concerns such as fevers, severe swelling, weakness, or loss of pulses.

## 2018-03-20 NOTE — ED Triage Notes (Signed)
Patient is complaining of left leg and knee pain. Patient states she went to [facility] a few weeks ago and they told her that she had bone spurs touching each other and arthritis. Patient states it is more than arthritis. She is having a lot pain in her knee and leg.

## 2018-04-03 ENCOUNTER — Other Ambulatory Visit: Payer: Self-pay

## 2018-04-03 ENCOUNTER — Emergency Department (HOSPITAL_COMMUNITY)
Admission: EM | Admit: 2018-04-03 | Discharge: 2018-04-03 | Disposition: A | Discharge status: Home / Self Care | Payer: Self-pay | Attending: Emergency Medicine | Admitting: Emergency Medicine

## 2018-04-03 ENCOUNTER — Encounter (HOSPITAL_COMMUNITY): Payer: Self-pay | Admitting: Emergency Medicine

## 2018-04-03 ENCOUNTER — Emergency Department (HOSPITAL_COMMUNITY): Payer: Self-pay

## 2018-04-03 DIAGNOSIS — J209 Acute bronchitis, unspecified: Secondary | ICD-10-CM | POA: Insufficient documentation

## 2018-04-03 DIAGNOSIS — R05 Cough: Secondary | ICD-10-CM | POA: Insufficient documentation

## 2018-04-03 DIAGNOSIS — Z79899 Other long term (current) drug therapy: Secondary | ICD-10-CM | POA: Insufficient documentation

## 2018-04-03 DIAGNOSIS — J069 Acute upper respiratory infection, unspecified: Secondary | ICD-10-CM | POA: Insufficient documentation

## 2018-04-03 DIAGNOSIS — I10 Essential (primary) hypertension: Secondary | ICD-10-CM | POA: Insufficient documentation

## 2018-04-03 DIAGNOSIS — F17210 Nicotine dependence, cigarettes, uncomplicated: Secondary | ICD-10-CM | POA: Insufficient documentation

## 2018-04-03 DIAGNOSIS — J45909 Unspecified asthma, uncomplicated: Secondary | ICD-10-CM | POA: Insufficient documentation

## 2018-04-03 MED ORDER — CEPHALEXIN 500 MG PO CAPS
500.0000 mg | ORAL_CAPSULE | Freq: Once | ORAL | Status: AC
  Administered 2018-04-03: 500 mg via ORAL
  Filled 2018-04-03: qty 1

## 2018-04-03 MED ORDER — ONDANSETRON HCL 4 MG PO TABS
4.0000 mg | ORAL_TABLET | Freq: Once | ORAL | Status: AC
  Administered 2018-04-03: 4 mg via ORAL
  Filled 2018-04-03: qty 1

## 2018-04-03 MED ORDER — AZITHROMYCIN 250 MG PO TABS
500.0000 mg | ORAL_TABLET | Freq: Once | ORAL | Status: AC
  Administered 2018-04-03: 500 mg via ORAL
  Filled 2018-04-03: qty 2

## 2018-04-03 MED ORDER — LORATADINE-PSEUDOEPHEDRINE ER 5-120 MG PO TB12: 1.0000 | ORAL_TABLET | Freq: Two times a day (BID) | ORAL | 0 refills | Status: DC

## 2018-04-03 MED ORDER — ALBUTEROL SULFATE (2.5 MG/3ML) 0.083% IN NEBU
5.0000 mg | INHALATION_SOLUTION | Freq: Once | RESPIRATORY_TRACT | Status: AC
  Administered 2018-04-03: 5 mg via RESPIRATORY_TRACT
  Filled 2018-04-03: qty 6

## 2018-04-03 MED ORDER — PREDNISONE 20 MG PO TABS: 40.0000 mg | ORAL_TABLET | Freq: Every day | ORAL | 0 refills | Status: DC

## 2018-04-03 MED ORDER — PREDNISONE 50 MG PO TABS
60.0000 mg | ORAL_TABLET | Freq: Once | ORAL | Status: AC
  Administered 2018-04-03: 60 mg via ORAL
  Filled 2018-04-03: qty 1

## 2018-04-03 MED ORDER — AZITHROMYCIN 250 MG PO TABS: ORAL_TABLET | ORAL | 0 refills | Status: DC

## 2018-04-03 NOTE — Discharge Instructions (Signed)
Your chest x-ray is negative for acute changes.  Your oxygen level is 99% on room air.  I suspect that you have a bronchitis and an upper respiratory infection with nasal congestion.  Please use Zithromax daily, Decadron 2 times daily, and Claritin-D 2 times daily for congestion and cough.  Please increase fluids.  Please wash hands frequently.  Please see the physicians at the local health department, or [facility] for follow-up and recheck if any changes or problems.  Return to the emergency department if any emergent changes in your condition, problems, or concerns.

## 2018-04-03 NOTE — ED Notes (Signed)
HB in to assess 

## 2018-04-03 NOTE — ED Triage Notes (Signed)
Cough x 4 days with malaise  No flu shot  No PCP  Does not use local clinics  Smoke 1 PPD

## 2018-04-03 NOTE — ED Provider Notes (Signed)
[facility] EMERGENCY DEPARTMENT Provider Note   CSN: [id number] Arrival date & time: 04/03/18  1042     History   Chief Complaint Chief Complaint  Patient presents with  . Cough    x 4 days    HPI Destiny Petty is a 37 y.o. female.  With patient is a 37 year old female who presents to the emergency department with a complaint of cough and congestion.  The patient states that she has been having a problem with cough and body aches over the past 4 days.  She is also had sinus congestion and nasal congestion.  The patient states that she has had this illness in the past.  She is concerned because in the past the problem has progressed to cause pneumonias and other illnesses related to the upper respiratory system.  The patient is not sure of any high fevers.  There is been no vomiting or unusual diarrhea.  There is been no rash appreciated.  The history is provided by the patient.  Cough  Associated symptoms include headaches and wheezing. Pertinent negatives include no chest pain and no shortness of breath.    Past Medical History:  Diagnosis Date  . Allergy    SEASONAL  . 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 Anxiety   . Asthma   . Back pain with right-sided sciatica 06/18/2016  . Body mass index 36.0-36.9, adult 04/10/2016  . Body mass index 37.0-37.9, adult 03/13/2016  . Depression   . Dysmenorrhea 04/10/2016  . GERD (gastroesophageal reflux disease)   . Hemorrhoids 02/10/2015  . Hiatal hernia   . Hyperlipidemia 04/09/2011  . Hypertension   . Menorrhagia with regular cycle 04/10/2016  . Muscle spasm of back 03/13/2016  . Obesity   . Ovarian cyst 12/18/2016  . Perianal wart 02/10/2015  . Stress 02/14/2016  . Weight loss counseling, encounter for 03/13/2016    Patient Active Problem List   Diagnosis Date Noted  . Ovarian cyst 12/18/2016  . 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 Numbness and tingling in left hand 08/19/2016  . Back pain with right-sided sciatica  06/18/2016  . Menorrhagia 04/10/2016  . Dysmenorrhea 04/10/2016  . Muscle spasm of back 03/13/2016  . Obesity 03/13/2016  . Weight loss counseling, encounter for 03/13/2016  . Stress 02/14/2016  . Bipolar disorder (HCC) 01/15/2016  . Hiatal hernia 01/15/2016  . Migraine 01/15/2016  . Tobacco use disorder 07/10/2013  . GERD (gastroesophageal reflux disease) 07/10/2013  . HTN (hypertension) 07/08/2013  . Asthma, mild persistent 04/05/2013  . Hyperlipidemia 04/09/2011  . Depressive disorder, not elsewhere classified 03/27/2007  . Allergic rhinitis 08/16/2003    Past Surgical History:  Procedure Laterality Date  . CESAREAN SECTION    . CHOLECYSTECTOMY    . COLONOSCOPY    . ESOPHAGOGASTRODUODENOSCOPY ENDOSCOPY    . TUBAL LIGATION       OB History    Gravida  2   Para  2   Term  2   Preterm      AB      Living  2     SAB      TAB      Ectopic      Multiple      Live Births  2            Home Medications    Prior to Admission medications   Medication Sig Start Date End Date Taking? Authorizing Provider  albuterol (PROVENTIL HFA;VENTOLIN HFA) 108 (90 Base) MCG/ACT  inhaler Inhale 2 puffs into the lungs every 6 (six) hours as needed. 10/30/17   Rancour, Jeannett SeniorStephen, MD  amoxicillin-clavulanate (AUGMENTIN) 875-125 MG tablet Take 1 tablet by mouth every 12 (twelve) hours. 10/30/17   Rancour, Jeannett SeniorStephen, MD  azithromycin (ZITHROMAX) 250 MG tablet Take 1 tablet (250 mg total) by mouth daily. Take first 2 tablets together, then 1 every day until finished. 09/07/17   Rancour, Jeannett SeniorStephen, MD  buPROPion (WELLBUTRIN SR) 150 MG 12 hr tablet TAKE ONE TABLET BY MOUTH TWICE DAILY. 02/21/17   Adline PotterGriffin, Jennifer A, NP  cyclobenzaprine (FLEXERIL) 10 MG tablet Take 1 tablet (10 mg total) by mouth 2 (two) times daily as needed for muscle spasms. 03/20/18   Fawze, Mina A, PA-C  dexamethasone (DECADRON) 4 MG tablet Take 1 tablet (4 mg total) by mouth 2 (two) times daily with a meal. 01/14/18    Ivery QualeBryant, Lashara Urey, PA-C  doxycycline (VIBRAMYCIN) 100 MG capsule Take 1 capsule (100 mg total) by mouth 2 (two) times daily. 10/30/17   Rancour, Jeannett SeniorStephen, MD  fluticasone (FLONASE) 50 MCG/ACT nasal spray Place 2 sprays into both nostrils as needed.  04/04/16   [provider]  hydrochlorothiazide (MICROZIDE) 12.5 MG capsule TAKE ONE CAPSULE BY MOUTH ONCE DAILY. 02/17/17   Adline PotterGriffin, Jennifer A, NP  ibuprofen (ADVIL,MOTRIN) 800 MG tablet TAKE (1) TABLET BY MOUTH EVERY EIGHT HOURS AS NEEDED. 11/19/16   Adline PotterGriffin, Jennifer A, NP  metroNIDAZOLE (FLAGYL) 500 MG tablet Take 1 tablet (500 mg total) by mouth 2 (two) times daily. 08/30/17   Triplett, Tammy, PA-C  ondansetron (ZOFRAN) 4 MG tablet Take 1 tablet (4 mg total) by mouth every 8 (eight) hours as needed for nausea or vomiting. 12/04/16   Eustace MooreNelson, Yvonne Sue, MD  pantoprazole (PROTONIX) 40 MG tablet Take 1 tablet (40 mg total) by mouth 2 (two) times daily. 03/28/17   Eustace MooreNelson, Yvonne Sue, MD    Family History Family History  Problem Relation Age of Onset  . Diabetes Mother   . Kidney failure Mother   . Early death Mother [id number]       diabetes complications  . Cancer Father [id number]       colon cancer  . Depression Sister   . Hypertension Sister   . Anxiety disorder Sister   . Other Sister        back problems  . ADD / ADHD Daughter   . Other Daughter        behavioral issues  . ODD Daughter   . Asthma Son   . Cancer Maternal Grandmother        breast  . Seizures Maternal Grandmother   . Lupus Other     Social History Social History   Tobacco Use  . Smoking status: Current Every Day Smoker    Packs/day: 1.00    Years: 20.00    Pack years: 20.00    Types: Cigarettes  . Smokeless tobacco: Never Used  . Tobacco comment: trying to quit  Substance Use Topics  . Alcohol use: Yes    Comment: occ  . Drug use: No     Allergies   Latex and Lisinopril   Review of Systems Review of Systems  Constitutional: Positive for fever. Negative  for activity change.       All ROS Neg except as noted in HPI  HENT: Positive for congestion. Negative for nosebleeds.   Eyes: Negative for photophobia and discharge.  Respiratory: Positive for cough and wheezing. Negative for shortness of breath.  Cardiovascular: Negative for chest pain and palpitations.  Gastrointestinal: Negative for abdominal pain and blood in stool.  Genitourinary: Negative for dysuria, frequency and hematuria.  Musculoskeletal: Negative for arthralgias, back pain and neck pain.  Skin: Negative.   Neurological: Positive for headaches. Negative for dizziness, seizures and speech difficulty.  Psychiatric/Behavioral: Negative for confusion and hallucinations.     Physical Exam Updated Vital Signs BP (!) 141/103 (BP Location: Right Arm)   Pulse 63   Temp 98.3 F (36.8 C) (Oral)   Resp 16   Ht 5\' 1"  (1.549 m)   Wt 81.6 kg (180 lb)   LMP 03/23/2018   SpO2 98%   BMI 34.01 kg/m   Physical Exam  Constitutional: She is oriented to person, place, and time. She appears well-developed and well-nourished.  Non-toxic appearance.  HENT:  Head: Normocephalic.  Right Ear: Tympanic membrane and external ear normal.  Left Ear: Tympanic membrane and external ear normal.  Nasal congestion.  Eyes: Pupils are equal, round, and reactive to light. EOM and lids are normal.  Neck: Normal range of motion. Neck supple. Carotid bruit is not present.  Cardiovascular: Normal rate, regular rhythm, normal heart sounds, intact distal pulses and normal pulses.  Pulmonary/Chest: No respiratory distress. She has wheezes.  Abdominal: Soft. Bowel sounds are normal. There is no tenderness. There is no guarding.  Musculoskeletal: Normal range of motion.  Lymphadenopathy:       Head (right side): No submandibular adenopathy present.       Head (left side): No submandibular adenopathy present.    She has no cervical adenopathy.  Neurological: She is alert and oriented to person, place, and  time. She has normal strength. No cranial nerve deficit or sensory deficit. Coordination normal.  Skin: Skin is warm and dry.  Psychiatric: She has a normal mood and affect. Her speech is normal.  Nursing note and vitals reviewed.    ED Treatments / Results  Labs (all labs ordered are listed, but only abnormal results are displayed) Labs Reviewed - No data to display  EKG None  Radiology Dg Chest 2 View  Result Date: 04/03/2018 CLINICAL DATA:  Cough.  Fever. EXAM: CHEST - 2 VIEW COMPARISON:  10/30/2017. FINDINGS: Mediastinum and hilar structures normal. Low lung volumes. No focal alveolar infiltrate. No pleural effusion or pneumothorax. Degenerative change thoracic spine. IMPRESSION: No acute cardiopulmonary disease. Electronically Signed   By: Maisie Fus  Register   On: 04/03/2018 11:40    Procedures Procedures (including critical care time)  Medications Ordered in ED Medications - No data to display   Initial Impression / Assessment and Plan / ED Course  I have reviewed the triage vital signs and the nursing notes.  Pertinent labs & imaging results that were available during my care of the patient were reviewed by me and considered in my medical decision making (see chart for details).       Final Clinical Impressions(s) / ED Diagnoses Blood pressure is elevated at 141/103.  The patient will be asked to have this rechecked soon.  The remainder of the vital signs are within normal limits.  The pulse oximetry is 98% on room air.  Within normal limits by my interpretation.  Chest xray is negative for acute problem.  Patient has congestion as well as wheezing present.  Patient will be treated with an albuterol inhaler and we will begin her course of antibiotic and steroids.   Final diagnoses:  Acute bronchitis, unspecified organism  Upper respiratory tract infection, unspecified type  ED Discharge Orders        Ordered    azithromycin (ZITHROMAX) 250 MG tablet      04/03/18 1352    predniSONE (DELTASONE) 20 MG tablet  Daily     04/03/18 1352    loratadine-pseudoephedrine (CLARITIN-D 12 HOUR) 5-120 MG tablet  2 times daily     04/03/18 1352       Ivery Quale, PA-C 04/03/18 1719    Mancel Bale, MD 04/04/18 [phone]

## 2018-04-03 NOTE — ED Notes (Signed)
Pt into hall upset and stating she is going to leave  She has to pick up her daughter

## 2018-04-03 NOTE — ED Notes (Signed)
resp called.

## 2018-04-03 NOTE — ED Notes (Signed)
FROM RAD

## 2018-07-11 ENCOUNTER — Other Ambulatory Visit: Payer: Self-pay

## 2018-07-11 ENCOUNTER — Encounter (HOSPITAL_COMMUNITY): Payer: Self-pay | Admitting: *Deleted

## 2018-07-11 ENCOUNTER — Emergency Department (HOSPITAL_COMMUNITY)
Admission: EM | Admit: 2018-07-11 | Discharge: 2018-07-11 | Disposition: A | Discharge status: Home / Self Care | Payer: Self-pay | Attending: Emergency Medicine | Admitting: Emergency Medicine

## 2018-07-11 DIAGNOSIS — Z5321 Procedure and treatment not carried out due to patient leaving prior to being seen by health care provider: Secondary | ICD-10-CM | POA: Insufficient documentation

## 2018-07-11 DIAGNOSIS — L0231 Cutaneous abscess of buttock: Secondary | ICD-10-CM | POA: Insufficient documentation

## 2018-07-11 NOTE — ED Notes (Signed)
Per registration, the pt reported she has to be at work in the morning by 7 and needs to get home.

## 2018-07-11 NOTE — ED Triage Notes (Signed)
Pt reports that she has a cyst over the upper buttocks area for about 3 months that is causing her pain when she sits down. The area has had some drainage. Has been taking tylenol and ibuprofen

## 2018-07-13 NOTE — ED Notes (Signed)
Follow up call made  Pt states she is going to return to ed  07/13/18  1053  s Darlina Mccaughey rn

## 2018-07-16 ENCOUNTER — Other Ambulatory Visit: Payer: Self-pay

## 2018-07-16 ENCOUNTER — Emergency Department (HOSPITAL_COMMUNITY)
Admission: EM | Admit: 2018-07-16 | Discharge: 2018-07-16 | Disposition: A | Discharge status: Home / Self Care | Payer: Self-pay | Attending: Emergency Medicine | Admitting: Emergency Medicine

## 2018-07-16 ENCOUNTER — Encounter (HOSPITAL_COMMUNITY): Payer: Self-pay

## 2018-07-16 DIAGNOSIS — I10 Essential (primary) hypertension: Secondary | ICD-10-CM | POA: Insufficient documentation

## 2018-07-16 DIAGNOSIS — L0501 Pilonidal cyst with abscess: Secondary | ICD-10-CM | POA: Insufficient documentation

## 2018-07-16 DIAGNOSIS — Z79899 Other long term (current) drug therapy: Secondary | ICD-10-CM | POA: Insufficient documentation

## 2018-07-16 DIAGNOSIS — F17210 Nicotine dependence, cigarettes, uncomplicated: Secondary | ICD-10-CM | POA: Insufficient documentation

## 2018-07-16 DIAGNOSIS — Z91040 Latex allergy status: Secondary | ICD-10-CM | POA: Insufficient documentation

## 2018-07-16 DIAGNOSIS — J45909 Unspecified asthma, uncomplicated: Secondary | ICD-10-CM | POA: Insufficient documentation

## 2018-07-16 DIAGNOSIS — Z23 Encounter for immunization: Secondary | ICD-10-CM | POA: Insufficient documentation

## 2018-07-16 MED ORDER — LIDOCAINE HCL (PF) 1 % IJ SOLN
30.0000 mL | Freq: Once | INTRAMUSCULAR | Status: DC
Start: 2018-07-16 — End: 2018-07-16

## 2018-07-16 MED ORDER — TETANUS-DIPHTH-ACELL PERTUSSIS 5-2.5-18.5 LF-MCG/0.5 IM SUSP
0.5000 mL | Freq: Once | INTRAMUSCULAR | Status: AC
  Administered 2018-07-16: 0.5 mL via INTRAMUSCULAR
  Filled 2018-07-16: qty 0.5

## 2018-07-16 MED ORDER — KETOROLAC TROMETHAMINE 60 MG/2ML IM SOLN
30.0000 mg | Freq: Once | INTRAMUSCULAR | Status: AC
  Administered 2018-07-16: 30 mg via INTRAMUSCULAR
  Filled 2018-07-16: qty 2

## 2018-07-16 MED ORDER — SULFAMETHOXAZOLE-TRIMETHOPRIM 800-160 MG PO TABS: 1.0000 | ORAL_TABLET | Freq: Two times a day (BID) | ORAL | 0 refills | Status: AC

## 2018-07-16 MED ORDER — LIDOCAINE HCL 1 % IJ SOLN
INTRAMUSCULAR | Status: AC
  Administered 2018-07-16: 20 mL
  Filled 2018-07-16: qty 20

## 2018-07-16 MED ORDER — SULFAMETHOXAZOLE-TRIMETHOPRIM 800-160 MG PO TABS
1.0000 | ORAL_TABLET | Freq: Once | ORAL | Status: AC
  Administered 2018-07-16: 1 via ORAL
  Filled 2018-07-16: qty 1

## 2018-07-16 NOTE — ED Triage Notes (Signed)
Pt reports a cyst over her buttocks x 3 months. She is requesting to get it removed. She states that she can get pus out of it when she squeezes it, but she cant pop it. Denies fever.

## 2018-07-16 NOTE — Discharge Instructions (Addendum)
Follow up in 2 days for wound check or sooner for any problems.

## 2018-07-16 NOTE — ED Provider Notes (Signed)
[facility]-EMERGENCY DEPT Provider Note   CSN: [id number] Arrival date & time: 07/16/18  1302     History   Chief Complaint Chief Complaint  Patient presents with  . Abscess    HPI Destiny Petty is a 37 y.o. female who presents to the ED for an abscess to the buttock. Patient reports that for the past 3 months she has had a recurrent cyst. She states that the area comes up then drains and gets better for a while then returns again. This time it has been there for a few days and is painful.  HPI  Past Medical History:  Diagnosis Date  . Allergy    SEASONAL  . 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 Anxiety   . Asthma   . Back pain with right-sided sciatica 06/18/2016  . Body mass index 36.0-36.9, adult 04/10/2016  . Body mass index 37.0-37.9, adult 03/13/2016  . Depression   . Dysmenorrhea 04/10/2016  . GERD (gastroesophageal reflux disease)   . Hemorrhoids 02/10/2015  . Hiatal hernia   . Hyperlipidemia 04/09/2011  . Hypertension   . Menorrhagia with regular cycle 04/10/2016  . Muscle spasm of back 03/13/2016  . Obesity   . Ovarian cyst 12/18/2016  . Perianal wart 02/10/2015  . Stress 02/14/2016  . Weight loss counseling, encounter for 03/13/2016    Patient Active Problem List   Diagnosis Date Noted  . Ovarian cyst 12/18/2016  . 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 Numbness and tingling in left hand 08/19/2016  . Back pain with right-sided sciatica 06/18/2016  . Menorrhagia 04/10/2016  . Dysmenorrhea 04/10/2016  . Muscle spasm of back 03/13/2016  . Obesity 03/13/2016  . Weight loss counseling, encounter for 03/13/2016  . Stress 02/14/2016  . Bipolar disorder (HCC) 01/15/2016  . Hiatal hernia 01/15/2016  . Migraine 01/15/2016  . Tobacco use disorder 07/10/2013  . GERD (gastroesophageal reflux disease) 07/10/2013  . HTN (hypertension) 07/08/2013  . Asthma, mild persistent 04/05/2013  . Hyperlipidemia 04/09/2011  . Depressive disorder, not  elsewhere classified 03/27/2007  . Allergic rhinitis 08/16/2003    Past Surgical History:  Procedure Laterality Date  . CESAREAN SECTION    . CHOLECYSTECTOMY    . COLONOSCOPY    . ESOPHAGOGASTRODUODENOSCOPY ENDOSCOPY    . TUBAL LIGATION       OB History    Gravida  2   Para  2   Term  2   Preterm      AB      Living  2     SAB      TAB      Ectopic      Multiple      Live Births  2            Home Medications    Prior to Admission medications   Medication Sig Start Date End Date Taking? Authorizing Provider  albuterol (PROVENTIL HFA;VENTOLIN HFA) 108 (90 Base) MCG/ACT inhaler Inhale 2 puffs into the lungs every 6 (six) hours as needed. 10/30/17   Rancour, Jeannett Senior, MD  amoxicillin-clavulanate (AUGMENTIN) 875-125 MG tablet Take 1 tablet by mouth every 12 (twelve) hours. 10/30/17   Rancour, Jeannett Senior, MD  azithromycin (ZITHROMAX) 250 MG tablet 1 po daily. 04/03/18   Ivery Quale, PA-C  buPROPion (WELLBUTRIN SR) 150 MG 12 hr tablet TAKE ONE TABLET BY MOUTH TWICE DAILY. 02/21/17   [name], NP  cyclobenzaprine (FLEXERIL) 10 MG tablet Take 1 tablet (10 mg total) by  mouth 2 (two) times daily as needed for muscle spasms. 03/20/18   Fawze, Mina A, PA-C  dexamethasone (DECADRON) 4 MG tablet Take 1 tablet (4 mg total) by mouth 2 (two) times daily with a meal. 01/14/18   Ivery QualeBryant, Hobson, PA-C  doxycycline (VIBRAMYCIN) 100 MG capsule Take 1 capsule (100 mg total) by mouth 2 (two) times daily. 10/30/17   Rancour, Jeannett SeniorStephen, MD  fluticasone (FLONASE) 50 MCG/ACT nasal spray Place 2 sprays into both nostrils as needed.  04/04/16   [provider]  hydrochlorothiazide (MICROZIDE) 12.5 MG capsule TAKE ONE CAPSULE BY MOUTH ONCE DAILY. 02/17/17   Adline PotterGriffin, Jennifer A, NP  ibuprofen (ADVIL,MOTRIN) 800 MG tablet TAKE (1) TABLET BY MOUTH EVERY EIGHT HOURS AS NEEDED. 11/19/16   Adline PotterGriffin, Jennifer A, NP  loratadine-pseudoephedrine (CLARITIN-D 12 HOUR) 5-120 MG tablet Take 1  tablet by mouth 2 (two) times daily. 04/03/18   Ivery QualeBryant, Hobson, PA-C  metroNIDAZOLE (FLAGYL) 500 MG tablet Take 1 tablet (500 mg total) by mouth 2 (two) times daily. 08/30/17   Triplett, Tammy, PA-C  ondansetron (ZOFRAN) 4 MG tablet Take 1 tablet (4 mg total) by mouth every 8 (eight) hours as needed for nausea or vomiting. 12/04/16   Eustace MooreNelson, Yvonne Sue, MD  pantoprazole (PROTONIX) 40 MG tablet Take 1 tablet (40 mg total) by mouth 2 (two) times daily. 03/28/17   Eustace MooreNelson, Yvonne Sue, MD  predniSONE (DELTASONE) 20 MG tablet Take 2 tablets (40 mg total) by mouth daily. 04/03/18   Ivery QualeBryant, Hobson, PA-C  sulfamethoxazole-trimethoprim (BACTRIM DS,SEPTRA DS) 800-160 MG tablet Take 1 tablet by mouth 2 (two) times daily for 7 days. 07/16/18 07/23/18  Janne NapoleonNeese, Hope M, NP    Family History Family History  Problem Relation Age of Onset  . Diabetes Mother   . Kidney failure Mother   . Early death Mother 6649       diabetes complications  . Cancer Father 2060       colon cancer  . Depression Sister   . Hypertension Sister   . Anxiety disorder Sister   . Other Sister        back problems  . ADD / ADHD Daughter   . Other Daughter        behavioral issues  . ODD Daughter   . Asthma Son   . Cancer Maternal Grandmother        breast  . Seizures Maternal Grandmother   . Lupus Other     Social History Social History   Tobacco Use  . Smoking status: Current Every Day Smoker    Packs/day: 1.00    Years: 20.00    Pack years: 20.00    Types: Cigarettes  . Smokeless tobacco: Never Used  . Tobacco comment: trying to quit  Substance Use Topics  . Alcohol use: Yes    Comment: occ  . Drug use: No     Allergies   Latex and Lisinopril   Review of Systems Review of Systems  Skin: Positive for wound.  All other systems reviewed and are negative.    Physical Exam Updated Vital Signs BP (!) 161/107 (BP Location: Left Arm)   Pulse 92   Temp 98.4 F (36.9 C) (Oral)   Resp 16   LMP 07/10/2018   SpO2  98%   Physical Exam  Constitutional: She appears well-developed and well-nourished. No distress.  HENT:  Head: Normocephalic.  Eyes: EOM are normal.  Neck: Neck supple.  Pulmonary/Chest: Effort normal.  Abdominal: Soft. There is no tenderness.  Musculoskeletal: Normal range of motion.  Neurological: She is alert.  Skin: Skin is warm and dry.     Pilonidal cyst  Psychiatric: She has a normal mood and affect. Her behavior is normal.  Nursing note and vitals reviewed.    ED Treatments / Results  Labs (all labs ordered are listed, but only abnormal results are displayed) Labs Reviewed - No data to display  Radiology No results found.  Procedures .Marland KitchenIncision and Drainage Date/Time: 07/16/2018 3:44 PM Performed by: Janne Napoleon, NP Authorized by: Janne Napoleon, NP   Consent:    Consent obtained:  Verbal   Consent given by:  Patient   Risks discussed:  Incomplete drainage and pain   Alternatives discussed:  Alternative treatment Location:    Type:  Pilonidal cyst   Location:  Anogenital   Anogenital location:  Pilonidal Pre-procedure details:    Skin preparation:  Betadine Anesthesia (see MAR for exact dosages):    Anesthesia method:  Local infiltration   Local anesthetic:  Lidocaine 1% w/o epi Procedure type:    Complexity:  Complex Procedure details:    Needle aspiration: no     Incision types:  Single straight   Incision depth:  Dermal   Scalpel blade:  11   Wound management:  Probed and deloculated and irrigated with saline   Drainage:  Purulent   Drainage amount:  Scant   Packing materials:  1/4 in gauze Post-procedure details:    Patient tolerance of procedure:  Tolerated well, no immediate complications Comments:     Tetanus updated, Bactrim DS   (including critical care time)  Medications Ordered in ED Medications  lidocaine (PF) (XYLOCAINE) 1 % injection 30 mL (has no administration in time range)  Tdap (BOOSTRIX) injection 0.5 mL (0.5 mLs  Intramuscular Given 07/16/18 1512)  lidocaine (XYLOCAINE) 1 % (with pres) injection (20 mLs  Given 07/16/18 1512)  sulfamethoxazole-trimethoprim (BACTRIM DS,SEPTRA DS) 800-160 MG per tablet 1 tablet (1 tablet Oral Given 07/16/18 1606)  ketorolac (TORADOL) injection 30 mg (30 mg Intramuscular Given 07/16/18 1607)     Initial Impression / Assessment and Plan / ED Course  I have reviewed the triage vital signs and the nursing notes. Patient with pilonidal abscess amenable to incision and drainage.  Abscess packed with 1/4 inch iodoform gauze. Wound recheck in 2 days.  Mild signs of cellulitis is surrounding skin.  Will d/c to home.  Antibiotic therapy is indicated. Final Clinical Impressions(s) / ED Diagnoses   Final diagnoses:  Pilonidal abscess    ED Discharge Orders         Ordered    sulfamethoxazole-trimethoprim (BACTRIM DS,SEPTRA DS) 800-160 MG tablet  2 times daily     07/16/18 1543           Kerrie Buffalo Milmay, NP 07/16/18 1614    Loren Racer, MD 07/20/18 [phone]

## 2018-07-21 ENCOUNTER — Other Ambulatory Visit: Payer: Self-pay

## 2018-07-21 ENCOUNTER — Encounter (HOSPITAL_COMMUNITY): Payer: Self-pay | Admitting: Emergency Medicine

## 2018-07-21 ENCOUNTER — Emergency Department (HOSPITAL_COMMUNITY)
Admission: EM | Admit: 2018-07-21 | Discharge: 2018-07-21 | Disposition: A | Discharge status: Home / Self Care | Payer: Self-pay | Attending: Emergency Medicine | Admitting: Emergency Medicine

## 2018-07-21 DIAGNOSIS — I10 Essential (primary) hypertension: Secondary | ICD-10-CM | POA: Insufficient documentation

## 2018-07-21 DIAGNOSIS — L0591 Pilonidal cyst without abscess: Secondary | ICD-10-CM

## 2018-07-21 DIAGNOSIS — Z5189 Encounter for other specified aftercare: Secondary | ICD-10-CM

## 2018-07-21 DIAGNOSIS — F17210 Nicotine dependence, cigarettes, uncomplicated: Secondary | ICD-10-CM | POA: Insufficient documentation

## 2018-07-21 DIAGNOSIS — J45909 Unspecified asthma, uncomplicated: Secondary | ICD-10-CM | POA: Insufficient documentation

## 2018-07-21 DIAGNOSIS — Z79899 Other long term (current) drug therapy: Secondary | ICD-10-CM | POA: Insufficient documentation

## 2018-07-21 NOTE — Discharge Instructions (Addendum)
Continue antibiotics as previously prescribed.  Apply warm soaks as frequently as possible for the next 2 days.  If your symptoms persist or recur, follow-up with general surgery.  The contact information for Dr. Lovell SheehanJenkins has been provided in this discharge summary for you to call and make these arrangements.

## 2018-07-21 NOTE — ED Provider Notes (Signed)
[facility] EMERGENCY DEPARTMENT Provider Note   CSN: [id number] Arrival date & time: 07/21/18  0028     History   Chief Complaint Chief Complaint  Patient presents with  . Abscess    HPI Destiny Petty is a 37 y.o. female.  Patient is a 37 year old female with no significant past medical history.  She presents today with complaints of pain to her buttock.  She was seen 3 days ago at [facility] and had a pilonidal cyst incised.  She is concerned that it is still swollen and states that it occasionally drains a green substance.  She denies any fevers or chills.  The history is provided by the patient.    Past Medical History:  Diagnosis Date  . Allergy    SEASONAL  . 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 Anxiety   . Asthma   . Back pain with right-sided sciatica 06/18/2016  . Body mass index 36.0-36.9, adult 04/10/2016  . Body mass index 37.0-37.9, adult 03/13/2016  . Depression   . Dysmenorrhea 04/10/2016  . GERD (gastroesophageal reflux disease)   . Hemorrhoids 02/10/2015  . Hiatal hernia   . Hyperlipidemia 04/09/2011  . Hypertension   . Menorrhagia with regular cycle 04/10/2016  . Muscle spasm of back 03/13/2016  . Obesity   . Ovarian cyst 12/18/2016  . Perianal wart 02/10/2015  . Stress 02/14/2016  . Weight loss counseling, encounter for 03/13/2016    Patient Active Problem List   Diagnosis Date Noted  . Ovarian cyst 12/18/2016  . 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 Numbness and tingling in left hand 08/19/2016  . Back pain with right-sided sciatica 06/18/2016  . Menorrhagia 04/10/2016  . Dysmenorrhea 04/10/2016  . Muscle spasm of back 03/13/2016  . Obesity 03/13/2016  . Weight loss counseling, encounter for 03/13/2016  . Stress 02/14/2016  . Bipolar disorder (HCC) 01/15/2016  . Hiatal hernia 01/15/2016  . Migraine 01/15/2016  . Tobacco use disorder 07/10/2013  . GERD (gastroesophageal reflux disease) 07/10/2013  . HTN (hypertension)  07/08/2013  . Asthma, mild persistent 04/05/2013  . Hyperlipidemia 04/09/2011  . Depressive disorder, not elsewhere classified 03/27/2007  . Allergic rhinitis 08/16/2003    Past Surgical History:  Procedure Laterality Date  . CESAREAN SECTION    . CHOLECYSTECTOMY    . COLONOSCOPY    . ESOPHAGOGASTRODUODENOSCOPY ENDOSCOPY    . TUBAL LIGATION       OB History    Gravida  2   Para  2   Term  2   Preterm      AB      Living  2     SAB      TAB      Ectopic      Multiple      Live Births  2            Home Medications    Prior to Admission medications   Medication Sig Start Date End Date Taking? Authorizing Provider  albuterol (PROVENTIL HFA;VENTOLIN HFA) 108 (90 Base) MCG/ACT inhaler Inhale 2 puffs into the lungs every 6 (six) hours as needed. 10/30/17  Yes Rancour, Jeannett SeniorStephen, MD  amoxicillin-clavulanate (AUGMENTIN) 875-125 MG tablet Take 1 tablet by mouth every 12 (twelve) hours. 10/30/17  Yes Rancour, Jeannett SeniorStephen, MD  azithromycin (ZITHROMAX) 250 MG tablet 1 po daily. 04/03/18  Yes Ivery QualeBryant, Hobson, PA-C  buPROPion (WELLBUTRIN SR) 150 MG 12 hr tablet TAKE ONE TABLET BY MOUTH TWICE DAILY. 02/21/17  Yes  Cyril MourningGriffin, Jennifer A, NP  cyclobenzaprine (FLEXERIL) 10 MG tablet Take 1 tablet (10 mg total) by mouth 2 (two) times daily as needed for muscle spasms. 03/20/18  Yes Fawze, Mina A, PA-C  dexamethasone (DECADRON) 4 MG tablet Take 1 tablet (4 mg total) by mouth 2 (two) times daily with a meal. 01/14/18  Yes Ivery QualeBryant, Hobson, PA-C  doxycycline (VIBRAMYCIN) 100 MG capsule Take 1 capsule (100 mg total) by mouth 2 (two) times daily. 10/30/17  Yes Rancour, Jeannett SeniorStephen, MD  fluticasone (FLONASE) 50 MCG/ACT nasal spray Place 2 sprays into both nostrils as needed.  04/04/16  Yes [provider]  hydrochlorothiazide (MICROZIDE) 12.5 MG capsule TAKE ONE CAPSULE BY MOUTH ONCE DAILY. 02/17/17  Yes Cyril MourningGriffin, Jennifer A, NP  ibuprofen (ADVIL,MOTRIN) 800 MG tablet TAKE (1) TABLET BY MOUTH  EVERY EIGHT HOURS AS NEEDED. 11/19/16  Yes Adline PotterGriffin, Jennifer A, NP  loratadine-pseudoephedrine (CLARITIN-D 12 HOUR) 5-120 MG tablet Take 1 tablet by mouth 2 (two) times daily. 04/03/18  Yes Ivery QualeBryant, Hobson, PA-C  metroNIDAZOLE (FLAGYL) 500 MG tablet Take 1 tablet (500 mg total) by mouth 2 (two) times daily. 08/30/17  Yes Triplett, Tammy, PA-C  ondansetron (ZOFRAN) 4 MG tablet Take 1 tablet (4 mg total) by mouth every 8 (eight) hours as needed for nausea or vomiting. 12/04/16  Yes Eustace MooreNelson, Yvonne Sue, MD  pantoprazole (PROTONIX) 40 MG tablet Take 1 tablet (40 mg total) by mouth 2 (two) times daily. 03/28/17  Yes Eustace MooreNelson, Yvonne Sue, MD  predniSONE (DELTASONE) 20 MG tablet Take 2 tablets (40 mg total) by mouth daily. 04/03/18  Yes Ivery QualeBryant, Hobson, PA-C  sulfamethoxazole-trimethoprim (BACTRIM DS,SEPTRA DS) 800-160 MG tablet Take 1 tablet by mouth 2 (two) times daily for 7 days. 07/16/18 07/23/18 Yes Janne NapoleonNeese, Hope M, NP    Family History Family History  Problem Relation Age of Onset  . Diabetes Mother   . Kidney failure Mother   . Early death Mother 7249       diabetes complications  . Cancer Father 2160       colon cancer  . Depression Sister   . Hypertension Sister   . Anxiety disorder Sister   . Other Sister        back problems  . ADD / ADHD Daughter   . Other Daughter        behavioral issues  . ODD Daughter   . Asthma Son   . Cancer Maternal Grandmother        breast  . Seizures Maternal Grandmother   . Lupus Other     Social History Social History   Tobacco Use  . Smoking status: Current Every Day Smoker    Packs/day: 1.00    Years: 20.00    Pack years: 20.00    Types: Cigarettes  . Smokeless tobacco: Never Used  . Tobacco comment: trying to quit  Substance Use Topics  . Alcohol use: Yes    Comment: occ  . Drug use: No     Allergies   Latex and Lisinopril   Review of Systems Review of Systems  All other systems reviewed and are negative.    Physical Exam Updated Vital  Signs BP (!) 153/80 (BP Location: Right Arm)   Pulse 88   Temp 98 F (36.7 C) (Oral)   Resp 20   Ht 5\' 1"  (1.549 m)   Wt 86.6 kg   LMP 07/10/2018   SpO2 99%   BMI 36.09 kg/m   Physical Exam  Constitutional: She is oriented  to person, place, and time. She appears well-developed and well-nourished. No distress.  HENT:  Head: Normocephalic and atraumatic.  Neck: Normal range of motion. Neck supple.  Pulmonary/Chest: Effort normal.  Neurological: She is alert and oriented to person, place, and time.  Skin: Skin is warm and dry. She is not diaphoretic.  The upper gluteal cleft appears normal with the exception of a small incision.  There is no drainage, purulent or serous.  There is no erythema, streaking, or fluctuance.  Nursing note and vitals reviewed.    ED Treatments / Results  Labs (all labs ordered are listed, but only abnormal results are displayed) Labs Reviewed - No data to display  EKG None  Radiology No results found.  Procedures Procedures (including critical care time)  Medications Ordered in ED Medications - No data to display   Initial Impression / Assessment and Plan / ED Course  I have reviewed the triage vital signs and the nursing notes.  Pertinent labs & imaging results that were available during my care of the patient were reviewed by me and considered in my medical decision making (see chart for details).  This appears to be a well-healing post I&D pilonidal cyst.  I see no indication for further drainage or prevention.  Patient offered reassurance.  If she feels as though she is not improving or if this recurs, she will be advised to follow-up with general surgery.  Final Clinical Impressions(s) / ED Diagnoses   Final diagnoses:  None    ED Discharge Orders    None       Geoffery Lyons, MD 07/21/18 0210

## 2018-07-21 NOTE — ED Triage Notes (Signed)
Pt recently seen at [facility] for lancing of cyst over buttocks. Pt here today d/t increased pain at site and draining "a green substance".

## 2018-08-14 ENCOUNTER — Encounter (HOSPITAL_COMMUNITY): Payer: Self-pay | Admitting: Emergency Medicine

## 2018-08-14 ENCOUNTER — Other Ambulatory Visit: Payer: Self-pay

## 2018-08-14 ENCOUNTER — Emergency Department (HOSPITAL_COMMUNITY)
Admission: EM | Admit: 2018-08-14 | Discharge: 2018-08-14 | Disposition: A | Discharge status: Home / Self Care | Payer: Self-pay | Attending: Emergency Medicine | Admitting: Emergency Medicine

## 2018-08-14 DIAGNOSIS — Z79899 Other long term (current) drug therapy: Secondary | ICD-10-CM | POA: Insufficient documentation

## 2018-08-14 DIAGNOSIS — T63441A Toxic effect of venom of bees, accidental (unintentional), initial encounter: Secondary | ICD-10-CM | POA: Insufficient documentation

## 2018-08-14 DIAGNOSIS — R0602 Shortness of breath: Secondary | ICD-10-CM | POA: Insufficient documentation

## 2018-08-14 DIAGNOSIS — I10 Essential (primary) hypertension: Secondary | ICD-10-CM | POA: Insufficient documentation

## 2018-08-14 DIAGNOSIS — F17210 Nicotine dependence, cigarettes, uncomplicated: Secondary | ICD-10-CM | POA: Insufficient documentation

## 2018-08-14 DIAGNOSIS — J4530 Mild persistent asthma, uncomplicated: Secondary | ICD-10-CM | POA: Insufficient documentation

## 2018-08-14 MED ORDER — PREDNISONE 20 MG PO TABS
40.0000 mg | ORAL_TABLET | Freq: Once | ORAL | Status: AC
  Administered 2018-08-14: 40 mg via ORAL
  Filled 2018-08-14: qty 2

## 2018-08-14 MED ORDER — TRIAMCINOLONE ACETONIDE 0.1 % EX CREA: 1.0000 "application " | TOPICAL_CREAM | Freq: Three times a day (TID) | CUTANEOUS | 0 refills | Status: DC

## 2018-08-14 MED ORDER — DIPHENHYDRAMINE HCL 25 MG PO CAPS
25.0000 mg | ORAL_CAPSULE | Freq: Once | ORAL | Status: AC
  Administered 2018-08-14: 25 mg via ORAL
  Filled 2018-08-14: qty 1

## 2018-08-14 MED ORDER — DIPHENHYDRAMINE HCL 25 MG PO TABS: 25.0000 mg | ORAL_TABLET | ORAL | 0 refills | Status: DC | PRN

## 2018-08-14 MED ORDER — ONDANSETRON 8 MG PO TBDP
8.0000 mg | ORAL_TABLET | Freq: Once | ORAL | Status: AC
  Administered 2018-08-14: 8 mg via ORAL
  Filled 2018-08-14: qty 1

## 2018-08-14 NOTE — ED Triage Notes (Signed)
Pt reports being stung by a hornet last night to interior upper arm. States she woke up this morning with mild SOB and increased swelling and redness. NAD noted. Pt ambulated back to room with no distress. No evidence of hives. No hx of allergic reaction to bees, pt has been stung before. Friend was also killed in car accident last night.

## 2018-08-14 NOTE — ED Provider Notes (Signed)
[facility] EMERGENCY DEPARTMENT Provider Note   CSN: [id number] Arrival date & time: 08/14/18  4098     History   Chief Complaint Chief Complaint  Patient presents with  . Insect Bite    HPI Destiny Petty is a 37 y.o. female.  HPI   Destiny Petty is a 37 y.o. female who presents to the Emergency Department complaining of bee sting to the inner left upper arm.  She states that she was stung by some sort of wasp.  She took one Benadryl tablet shortly after the sting occurred.  She describes immediate swelling and redness at the site and states she woke up this morning with nausea, increased redness and pain to her arm with mild shortness of breath.  She also states that she was informed last evening that a friend was killed in a car accident.  She denies chest pain, vomiting, numbness of her arm, swelling of her face lips or tongue.  She also denies difficulty swallowing.  No history of anaphylaxis to previous bee stings.    Past Medical History:  Diagnosis Date  . Allergy    SEASONAL  . 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 Anxiety   . Asthma   . Back pain with right-sided sciatica 06/18/2016  . Body mass index 36.0-36.9, adult 04/10/2016  . Body mass index 37.0-37.9, adult 03/13/2016  . Depression   . Dysmenorrhea 04/10/2016  . GERD (gastroesophageal reflux disease)   . Hemorrhoids 02/10/2015  . Hiatal hernia   . Hyperlipidemia 04/09/2011  . Hypertension   . Menorrhagia with regular cycle 04/10/2016  . Muscle spasm of back 03/13/2016  . Obesity   . Ovarian cyst 12/18/2016  . Perianal wart 02/10/2015  . Stress 02/14/2016  . Weight loss counseling, encounter for 03/13/2016    Patient Active Problem List   Diagnosis Date Noted  . Ovarian cyst 12/18/2016  . 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 Numbness and tingling in left hand 08/19/2016  . Back pain with right-sided sciatica 06/18/2016  . Menorrhagia 04/10/2016  . Dysmenorrhea 04/10/2016  . Muscle spasm  of back 03/13/2016  . Obesity 03/13/2016  . Weight loss counseling, encounter for 03/13/2016  . Stress 02/14/2016  . Bipolar disorder (HCC) 01/15/2016  . Hiatal hernia 01/15/2016  . Migraine 01/15/2016  . Tobacco use disorder 07/10/2013  . GERD (gastroesophageal reflux disease) 07/10/2013  . HTN (hypertension) 07/08/2013  . Asthma, mild persistent 04/05/2013  . Hyperlipidemia 04/09/2011  . Depressive disorder, not elsewhere classified 03/27/2007  . Allergic rhinitis 08/16/2003    Past Surgical History:  Procedure Laterality Date  . CESAREAN SECTION    . CHOLECYSTECTOMY    . COLONOSCOPY    . ESOPHAGOGASTRODUODENOSCOPY ENDOSCOPY    . TUBAL LIGATION       OB History    Gravida  2   Para  2   Term  2   Preterm      AB      Living  2     SAB      TAB      Ectopic      Multiple      Live Births  2            Home Medications    Prior to Admission medications   Medication Sig Start Date End Date Taking? Authorizing Provider  albuterol (PROVENTIL HFA;VENTOLIN HFA) 108 (90 Base) MCG/ACT inhaler Inhale 2 puffs into the lungs every 6 (six) hours as  needed. 10/30/17   Rancour, Jeannett Senior, MD  amoxicillin-clavulanate (AUGMENTIN) 875-125 MG tablet Take 1 tablet by mouth every 12 (twelve) hours. 10/30/17   Rancour, Jeannett Senior, MD  azithromycin (ZITHROMAX) 250 MG tablet 1 po daily. 04/03/18   Ivery Quale, PA-C  buPROPion (WELLBUTRIN SR) 150 MG 12 hr tablet TAKE ONE TABLET BY MOUTH TWICE DAILY. 02/21/17   [name], NP  cyclobenzaprine (FLEXERIL) 10 MG tablet Take 1 tablet (10 mg total) by mouth 2 (two) times daily as needed for muscle spasms. 03/20/18   Fawze, Mina A, PA-C  dexamethasone (DECADRON) 4 MG tablet Take 1 tablet (4 mg total) by mouth 2 (two) times daily with a meal. 01/14/18   Ivery Quale, PA-C  doxycycline (VIBRAMYCIN) 100 MG capsule Take 1 capsule (100 mg total) by mouth 2 (two) times daily. 10/30/17   Rancour, Jeannett Senior, MD  fluticasone (FLONASE) 50  MCG/ACT nasal spray Place 2 sprays into both nostrils as needed.  04/04/16   [provider]  hydrochlorothiazide (MICROZIDE) 12.5 MG capsule TAKE ONE CAPSULE BY MOUTH ONCE DAILY. 02/17/17   [name], NP  ibuprofen (ADVIL,MOTRIN) 800 MG tablet TAKE (1) TABLET BY MOUTH EVERY EIGHT HOURS AS NEEDED. 11/19/16   [name], NP  loratadine-pseudoephedrine (CLARITIN-D 12 HOUR) 5-120 MG tablet Take 1 tablet by mouth 2 (two) times daily. 04/03/18   Ivery Quale, PA-C  metroNIDAZOLE (FLAGYL) 500 MG tablet Take 1 tablet (500 mg total) by mouth 2 (two) times daily. 08/30/17   Shonta Phillis, PA-C  ondansetron (ZOFRAN) 4 MG tablet Take 1 tablet (4 mg total) by mouth every 8 (eight) hours as needed for nausea or vomiting. 12/04/16   Eustace Moore, MD  pantoprazole (PROTONIX) 40 MG tablet Take 1 tablet (40 mg total) by mouth 2 (two) times daily. 03/28/17   Eustace Moore, MD  predniSONE (DELTASONE) 20 MG tablet Take 2 tablets (40 mg total) by mouth daily. 04/03/18   Ivery Quale, PA-C    Family History Family History  Problem Relation Age of Onset  . Diabetes Mother   . Kidney failure Mother   . Early death Mother 46       diabetes complications  . Cancer Father 50       colon cancer  . Depression Sister   . Hypertension Sister   . Anxiety disorder Sister   . Other Sister        back problems  . ADD / ADHD Daughter   . Other Daughter        behavioral issues  . ODD Daughter   . Asthma Son   . Cancer Maternal Grandmother        breast  . Seizures Maternal Grandmother   . Lupus Other     Social History Social History   Tobacco Use  . Smoking status: Current Every Day Smoker    Packs/day: 1.00    Years: 20.00    Pack years: 20.00    Types: Cigarettes  . Smokeless tobacco: Never Used  . Tobacco comment: trying to quit  Substance Use Topics  . Alcohol use: Yes    Comment: occ  . Drug use: No     Allergies   Latex and Lisinopril   Review of  Systems Review of Systems  Constitutional: Negative for activity change, appetite change, chills and fever.  HENT: Negative for facial swelling, sore throat, trouble swallowing and voice change.   Respiratory: Positive for shortness of breath. Negative for chest tightness, wheezing and  stridor.   Cardiovascular: Negative for chest pain.  Gastrointestinal: Positive for nausea. Negative for abdominal pain and vomiting.  Musculoskeletal: Negative for neck pain and neck stiffness.  Skin: Positive for color change. Negative for rash and wound.       Redness pain and swelling of the left upper arm  Neurological: Negative for dizziness, weakness, numbness and headaches.     Physical Exam Updated Vital Signs BP 132/64 (BP Location: Right Arm)   Pulse 79   Temp 97.7 F (36.5 C) (Oral)   Resp 18   Ht 5\' 1"  (1.549 m)   Wt 86.2 kg   LMP 08/06/2018 (Approximate)   SpO2 100%   BMI 35.90 kg/m   Physical Exam  Constitutional: She appears well-developed and well-nourished. No distress.  HENT:  Head: Atraumatic.  Mouth/Throat: Uvula is midline, oropharynx is clear and moist and mucous membranes are normal. No uvula swelling. No posterior oropharyngeal edema or posterior oropharyngeal erythema.  Eyes: Pupils are equal, round, and reactive to light. EOM are normal.  Neck: Normal range of motion.  Cardiovascular: Normal rate, regular rhythm and normal heart sounds.  Pulmonary/Chest: Effort normal and breath sounds normal. No stridor. No respiratory distress. She has no wheezes.  Pt is speaking in complete sentences w/o respiratory distress.   Abdominal: Soft. She exhibits no distension. There is no tenderness.  Musculoskeletal: Normal range of motion.  Neurological: She is alert. No sensory deficit.  Skin: Skin is warm. Capillary refill takes less than 2 seconds.  Focal area of erythema of the left upper arm.  No induration.  No welt.  No associated rash.  Psychiatric: She has a normal mood and  affect.  Nursing note and vitals reviewed.    Destiny Treatments / Results  Labs (all labs ordered are listed, but only abnormal results are displayed) Labs Reviewed - No data to display  EKG None  Radiology No results found.  Procedures Procedures (including critical care time)  Medications Ordered in Destiny Medications  diphenhydrAMINE (BENADRYL) capsule 25 mg (has no administration in time range)  predniSONE (DELTASONE) tablet 40 mg (has no administration in time range)  ondansetron (ZOFRAN-ODT) disintegrating tablet 8 mg (has no administration in time range)     Initial Impression / Assessment and Plan / Destiny Course  I have reviewed the triage vital signs and the nursing notes.  Pertinent labs & imaging results that were available during my care of the patient were reviewed by me and considered in my medical decision making (see chart for details).     On recheck, patient states she is feeling better and she is ready for discharge home.  Shortness of breath has resolved as well as nausea. Tolerating oral fluids.  She appears appropriate for discharge home, agrees to treatment plan with continued ice, Benadryl, and steroid cream. Return precautions discussed.   Final Clinical Impressions(s) / Destiny Diagnoses   Final diagnoses:  Bee sting, accidental or unintentional, initial encounter    Destiny Discharge Orders    None       Pauline Aus, PA-C 08/14/18 1021    Samuel Jester, DO 08/18/18 [phone]

## 2018-08-14 NOTE — Discharge Instructions (Addendum)
Apply ice packs on and off to the affected area.  Continue taking Benadryl as directed.  Follow-up with your primary doctor or return to the ER for any worsening symptoms.

## 2018-09-06 ENCOUNTER — Encounter (HOSPITAL_COMMUNITY): Payer: Self-pay | Admitting: Emergency Medicine

## 2018-09-06 ENCOUNTER — Emergency Department (HOSPITAL_COMMUNITY)
Admission: EM | Admit: 2018-09-06 | Discharge: 2018-09-06 | Disposition: A | Discharge status: Home / Self Care | Payer: Self-pay | Attending: Emergency Medicine | Admitting: Emergency Medicine

## 2018-09-06 ENCOUNTER — Emergency Department (HOSPITAL_COMMUNITY): Payer: Self-pay

## 2018-09-06 DIAGNOSIS — F17210 Nicotine dependence, cigarettes, uncomplicated: Secondary | ICD-10-CM | POA: Insufficient documentation

## 2018-09-06 DIAGNOSIS — F419 Anxiety disorder, unspecified: Secondary | ICD-10-CM | POA: Insufficient documentation

## 2018-09-06 DIAGNOSIS — F319 Bipolar disorder, unspecified: Secondary | ICD-10-CM | POA: Insufficient documentation

## 2018-09-06 DIAGNOSIS — Z9049 Acquired absence of other specified parts of digestive tract: Secondary | ICD-10-CM | POA: Insufficient documentation

## 2018-09-06 DIAGNOSIS — J40 Bronchitis, not specified as acute or chronic: Secondary | ICD-10-CM | POA: Insufficient documentation

## 2018-09-06 DIAGNOSIS — Z79899 Other long term (current) drug therapy: Secondary | ICD-10-CM | POA: Insufficient documentation

## 2018-09-06 DIAGNOSIS — Z91040 Latex allergy status: Secondary | ICD-10-CM | POA: Insufficient documentation

## 2018-09-06 DIAGNOSIS — I10 Essential (primary) hypertension: Secondary | ICD-10-CM | POA: Insufficient documentation

## 2018-09-06 MED ORDER — BENZONATATE 200 MG PO CAPS: 200.0000 mg | ORAL_CAPSULE | Freq: Three times a day (TID) | ORAL | 0 refills | Status: DC

## 2018-09-06 MED ORDER — IPRATROPIUM-ALBUTEROL 0.5-2.5 (3) MG/3ML IN SOLN
3.0000 mL | Freq: Once | RESPIRATORY_TRACT | Status: AC
  Administered 2018-09-06: 3 mL via RESPIRATORY_TRACT
  Filled 2018-09-06: qty 3

## 2018-09-06 MED ORDER — ALBUTEROL SULFATE HFA 108 (90 BASE) MCG/ACT IN AERS
2.0000 | INHALATION_SPRAY | Freq: Once | RESPIRATORY_TRACT | Status: AC
  Administered 2018-09-06: 2 via RESPIRATORY_TRACT
  Filled 2018-09-06: qty 6.7

## 2018-09-06 MED ORDER — PREDNISONE 20 MG PO TABS: 40.0000 mg | ORAL_TABLET | Freq: Every day | ORAL | 0 refills | Status: DC

## 2018-09-06 MED ORDER — ALBUTEROL SULFATE (2.5 MG/3ML) 0.083% IN NEBU
2.5000 mg | INHALATION_SOLUTION | Freq: Once | RESPIRATORY_TRACT | Status: AC
  Administered 2018-09-06: 2.5 mg via RESPIRATORY_TRACT
  Filled 2018-09-06: qty 3

## 2018-09-06 NOTE — ED Provider Notes (Signed)
[facility] EMERGENCY DEPARTMENT Provider Note   CSN: [id number] Arrival date & time: 09/06/18  1442     History   Chief Complaint Chief Complaint  Patient presents with  . Cough    HPI Destiny Petty is a 37 y.o. female.  HPI   Destiny Petty is a 37 y.o. female who presents to the Emergency Department complaining of cough nasal congestion and intermittent fever.  Symptoms have been present for 1 week.  She describes her cough as productive at times.  She states that she has a history of frequent bronchitis and asthma.  She is ran out of her inhaler recently.  She has been taking over-the-counter cough and cold medications with minimal relief.  Cough is exacerbated by activity.  She denies chest pain, shortness of breath, posttussive emesis or hemoptysis.  She states she has had symptoms similar to this in the past and was diagnosed with pneumonia.   Past Medical History:  Diagnosis Date  . Allergy    SEASONAL  . 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 Anxiety   . Asthma   . Back pain with right-sided sciatica 06/18/2016  . Body mass index 36.0-36.9, adult 04/10/2016  . Body mass index 37.0-37.9, adult 03/13/2016  . Depression   . Dysmenorrhea 04/10/2016  . GERD (gastroesophageal reflux disease)   . Hemorrhoids 02/10/2015  . Hiatal hernia   . Hyperlipidemia 04/09/2011  . Hypertension   . Menorrhagia with regular cycle 04/10/2016  . Muscle spasm of back 03/13/2016  . Obesity   . Ovarian cyst 12/18/2016  . Perianal wart 02/10/2015  . Stress 02/14/2016  . Weight loss counseling, encounter for 03/13/2016    Patient Active Problem List   Diagnosis Date Noted  . Ovarian cyst 12/18/2016  . 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 Numbness and tingling in left hand 08/19/2016  . Back pain with right-sided sciatica 06/18/2016  . Menorrhagia 04/10/2016  . Dysmenorrhea 04/10/2016  . Muscle spasm of back 03/13/2016  . Obesity 03/13/2016  . Weight loss counseling,  encounter for 03/13/2016  . Stress 02/14/2016  . Bipolar disorder (HCC) 01/15/2016  . Hiatal hernia 01/15/2016  . Migraine 01/15/2016  . Tobacco use disorder 07/10/2013  . GERD (gastroesophageal reflux disease) 07/10/2013  . HTN (hypertension) 07/08/2013  . Asthma, mild persistent 04/05/2013  . Hyperlipidemia 04/09/2011  . Depressive disorder, not elsewhere classified 03/27/2007  . Allergic rhinitis 08/16/2003    Past Surgical History:  Procedure Laterality Date  . CESAREAN SECTION    . CHOLECYSTECTOMY    . COLONOSCOPY    . ESOPHAGOGASTRODUODENOSCOPY ENDOSCOPY    . TUBAL LIGATION       OB History    Gravida  2   Para  2   Term  2   Preterm      AB      Living  2     SAB      TAB      Ectopic      Multiple      Live Births  2            Home Medications    Prior to Admission medications   Medication Sig Start Date End Date Taking? Authorizing Provider  albuterol (PROVENTIL HFA;VENTOLIN HFA) 108 (90 Base) MCG/ACT inhaler Inhale 2 puffs into the lungs every 6 (six) hours as needed. 10/30/17   Rancour, Jeannett Senior, MD  amoxicillin-clavulanate (AUGMENTIN) 875-125 MG tablet Take 1 tablet by mouth every 12 (twelve)  hours. 10/30/17   Rancour, Jeannett Senior, MD  azithromycin (ZITHROMAX) 250 MG tablet 1 po daily. 04/03/18   Ivery Quale, PA-C  buPROPion (WELLBUTRIN SR) 150 MG 12 hr tablet TAKE ONE TABLET BY MOUTH TWICE DAILY. 02/21/17   [name], NP  cyclobenzaprine (FLEXERIL) 10 MG tablet Take 1 tablet (10 mg total) by mouth 2 (two) times daily as needed for muscle spasms. 03/20/18   Fawze, Mina A, PA-C  dexamethasone (DECADRON) 4 MG tablet Take 1 tablet (4 mg total) by mouth 2 (two) times daily with a meal. 01/14/18   Ivery Quale, PA-C  diphenhydrAMINE (BENADRYL) 25 MG tablet Take 1 tablet (25 mg total) by mouth every 4 (four) hours as needed. 08/14/18   Shenica Holzheimer, PA-C  doxycycline (VIBRAMYCIN) 100 MG capsule Take 1 capsule (100 mg total) by mouth 2 (two)  times daily. 10/30/17   Rancour, Jeannett Senior, MD  fluticasone (FLONASE) 50 MCG/ACT nasal spray Place 2 sprays into both nostrils as needed.  04/04/16   [provider]  hydrochlorothiazide (MICROZIDE) 12.5 MG capsule TAKE ONE CAPSULE BY MOUTH ONCE DAILY. 02/17/17   [name], NP  ibuprofen (ADVIL,MOTRIN) 800 MG tablet TAKE (1) TABLET BY MOUTH EVERY EIGHT HOURS AS NEEDED. 11/19/16   [name], NP  loratadine-pseudoephedrine (CLARITIN-D 12 HOUR) 5-120 MG tablet Take 1 tablet by mouth 2 (two) times daily. 04/03/18   Ivery Quale, PA-C  metroNIDAZOLE (FLAGYL) 500 MG tablet Take 1 tablet (500 mg total) by mouth 2 (two) times daily. 08/30/17   Dunya Meiners, PA-C  ondansetron (ZOFRAN) 4 MG tablet Take 1 tablet (4 mg total) by mouth every 8 (eight) hours as needed for nausea or vomiting. 12/04/16   Eustace Moore, MD  pantoprazole (PROTONIX) 40 MG tablet Take 1 tablet (40 mg total) by mouth 2 (two) times daily. 03/28/17   Eustace Moore, MD  predniSONE (DELTASONE) 20 MG tablet Take 2 tablets (40 mg total) by mouth daily. 04/03/18   Ivery Quale, PA-C  triamcinolone cream (KENALOG) 0.1 % Apply 1 application topically 3 (three) times daily. 08/14/18   Pauline Aus, PA-C    Family History Family History  Problem Relation Age of Onset  . Diabetes Mother   . Kidney failure Mother   . Early death Mother 44       diabetes complications  . Cancer Father 42       colon cancer  . Depression Sister   . Hypertension Sister   . Anxiety disorder Sister   . Other Sister        back problems  . ADD / ADHD Daughter   . Other Daughter        behavioral issues  . ODD Daughter   . Asthma Son   . Cancer Maternal Grandmother        breast  . Seizures Maternal Grandmother   . Lupus Other     Social History Social History   Tobacco Use  . Smoking status: Current Every Day Smoker    Packs/day: 1.00    Years: 20.00    Pack years: 20.00    Types: Cigarettes  . Smokeless  tobacco: Never Used  . Tobacco comment: trying to quit  Substance Use Topics  . Alcohol use: Yes    Comment: occ  . Drug use: No     Allergies   Latex and Lisinopril   Review of Systems Review of Systems  Constitutional: Positive for fever. Negative for appetite change and chills.  HENT:  Positive for congestion, rhinorrhea and sinus pressure. Negative for sore throat and trouble swallowing.   Respiratory: Positive for cough, chest tightness and wheezing. Negative for shortness of breath.   Cardiovascular: Negative for chest pain.  Gastrointestinal: Negative for abdominal pain, nausea and vomiting.  Musculoskeletal: Negative for arthralgias.  Skin: Negative for rash.  Neurological: Negative for dizziness, weakness and numbness.  Hematological: Negative for adenopathy.     Physical Exam Updated Vital Signs BP (!) 142/91 (BP Location: Right Arm)   Pulse 90   Temp 98 F (36.7 C) (Oral)   Resp 18   Ht 5\' 1"  (1.549 m)   Wt 87 kg   SpO2 98%   BMI 36.24 kg/m   Physical Exam  Constitutional: No distress.  HENT:  Head: Normocephalic and atraumatic.  Right Ear: Tympanic membrane and ear canal normal.  Left Ear: Tympanic membrane and ear canal normal.  Mouth/Throat: Uvula is midline, oropharynx is clear and moist and mucous membranes are normal. No oropharyngeal exudate.  Neck: Normal range of motion, full passive range of motion without pain and phonation normal. No JVD present.  Cardiovascular: Normal rate and regular rhythm.  No murmur heard. Pulmonary/Chest: Effort normal. No stridor. No respiratory distress. She has wheezes. She has no rales. She exhibits no tenderness.  Coarse lungs sounds bilaterally with expiratory wheezes throughout.  No rales.  Abdominal: Soft. She exhibits no distension. There is no tenderness.  Musculoskeletal: Normal range of motion. She exhibits no edema.  Lymphadenopathy:    She has no cervical adenopathy.  Neurological: She is alert. No  sensory deficit. She exhibits normal muscle tone.  Skin: Skin is warm. No rash noted.  Nursing note and vitals reviewed.    ED Treatments / Results  Labs (all labs ordered are listed, but only abnormal results are displayed) Labs Reviewed - No data to display  EKG None  Radiology Dg Chest 2 View  Result Date: 09/06/2018 CLINICAL DATA:  Productive cough EXAM: CHEST - 2 VIEW COMPARISON:  04/03/2018 FINDINGS: The heart size and mediastinal contours are within normal limits. Both lungs are clear. The visualized skeletal structures are unremarkable. IMPRESSION: No active cardiopulmonary disease. Electronically Signed   By: Elige Ko   On: 09/06/2018 16:21    Procedures Procedures (including critical care time)  Medications Ordered in ED Medications  ipratropium-albuterol (DUONEB) 0.5-2.5 (3) MG/3ML nebulizer solution 3 mL (has no administration in time range)  albuterol (PROVENTIL) (2.5 MG/3ML) 0.083% nebulizer solution 2.5 mg (has no administration in time range)  albuterol (PROVENTIL HFA;VENTOLIN HFA) 108 (90 Base) MCG/ACT inhaler 2 puff (has no administration in time range)     Initial Impression / Assessment and Plan / ED Course  I have reviewed the triage vital signs and the nursing notes.  Pertinent labs & imaging results that were available during my care of the patient were reviewed by me and considered in my medical decision making (see chart for details).     Patient is nontoxic-appearing.  Vital signs reviewed.  No hypoxia or tachypnea.  Chest x-ray neg for PNA, likely bronchitis.   1645 on recheck after neb, wheezing improved.  No respiratory distress noted.  Patient reports feeling better.  She appears appropriate for discharge home, Pt agrees to tx plan with steroid, albuterol MDI, tessalon Perles and close PCP f/u. Return precautions discussed.   Final Clinical Impressions(s) / ED Diagnoses   Final diagnoses:  Bronchitis    ED Discharge Orders    None  Pauline Aus, PA-C 09/06/18 1659    [name], MD 09/07/18 1356

## 2018-09-06 NOTE — Progress Notes (Signed)
Patient instructed on use of Albuterol MDI with spacer. She had good effort and technique.

## 2018-09-06 NOTE — ED Triage Notes (Signed)
Pt states she has had cough, congestion, and intermittent fever since last Friday.  Productive cough.

## 2018-09-06 NOTE — Discharge Instructions (Addendum)
Drink plenty of fluids.  Tylenol every 4 hours if needed for fever.  Take the redness own as directed until finished.  2 puffs of the albuterol inhaler 4 times a day, then use as needed.  Follow-up with your primary doctor or return to the ER for any worsening symptoms.

## 2018-09-16 ENCOUNTER — Other Ambulatory Visit: Payer: Self-pay

## 2018-09-16 ENCOUNTER — Emergency Department (HOSPITAL_COMMUNITY)
Admission: EM | Admit: 2018-09-16 | Discharge: 2018-09-17 | Disposition: A | Discharge status: Home / Self Care | Payer: Self-pay | Attending: Emergency Medicine | Admitting: Emergency Medicine

## 2018-09-16 ENCOUNTER — Encounter (HOSPITAL_COMMUNITY): Payer: Self-pay | Admitting: Emergency Medicine

## 2018-09-16 ENCOUNTER — Emergency Department (HOSPITAL_COMMUNITY): Payer: Self-pay

## 2018-09-16 DIAGNOSIS — M62838 Other muscle spasm: Secondary | ICD-10-CM | POA: Insufficient documentation

## 2018-09-16 DIAGNOSIS — Z79899 Other long term (current) drug therapy: Secondary | ICD-10-CM | POA: Insufficient documentation

## 2018-09-16 DIAGNOSIS — F17210 Nicotine dependence, cigarettes, uncomplicated: Secondary | ICD-10-CM | POA: Insufficient documentation

## 2018-09-16 DIAGNOSIS — Z91040 Latex allergy status: Secondary | ICD-10-CM | POA: Insufficient documentation

## 2018-09-16 DIAGNOSIS — J209 Acute bronchitis, unspecified: Secondary | ICD-10-CM | POA: Insufficient documentation

## 2018-09-16 DIAGNOSIS — M79604 Pain in right leg: Secondary | ICD-10-CM | POA: Insufficient documentation

## 2018-09-16 DIAGNOSIS — J45909 Unspecified asthma, uncomplicated: Secondary | ICD-10-CM | POA: Insufficient documentation

## 2018-09-16 DIAGNOSIS — I10 Essential (primary) hypertension: Secondary | ICD-10-CM | POA: Insufficient documentation

## 2018-09-16 DIAGNOSIS — M79605 Pain in left leg: Secondary | ICD-10-CM | POA: Insufficient documentation

## 2018-09-16 MED ORDER — SODIUM CHLORIDE 0.9 % IV BOLUS
1000.0000 mL | Freq: Once | INTRAVENOUS | Status: AC
  Administered 2018-09-17: 1000 mL via INTRAVENOUS

## 2018-09-16 MED ORDER — CYCLOBENZAPRINE HCL 10 MG PO TABS
10.0000 mg | ORAL_TABLET | Freq: Once | ORAL | Status: AC
  Administered 2018-09-17: 10 mg via ORAL
  Filled 2018-09-16: qty 1

## 2018-09-16 MED ORDER — KETOROLAC TROMETHAMINE 30 MG/ML IJ SOLN
15.0000 mg | Freq: Once | INTRAMUSCULAR | Status: AC
  Administered 2018-09-17: 15 mg via INTRAVENOUS
  Filled 2018-09-16: qty 1

## 2018-09-16 NOTE — ED Triage Notes (Signed)
Pt c/o fever, weakness, aches x 3 weeks, pt states she was treated for the flu 3 weeks ago, then had bronchitis 2 weeks ago, pt reports fever continues to come and go, no fever in triage at this time, pt also requests HIV testing as she and her spouse have separated

## 2018-09-16 NOTE — ED Notes (Signed)
Patient has multiple complaints, sick for past 3 weeks. C/o generalized aches. bilateral leg pain. Patient complains of yellow phlegm.

## 2018-09-17 ENCOUNTER — Other Ambulatory Visit: Payer: Self-pay

## 2018-09-17 LAB — BASIC METABOLIC PANEL
Anion gap: 7 (ref 5–15)
BUN: 9 mg/dL (ref 6–20)
CO2: 24 mmol/L (ref 22–32)
CREATININE: 0.7 mg/dL (ref 0.44–1.00)
Calcium: 8.5 mg/dL — ABNORMAL LOW (ref 8.9–10.3)
Chloride: 109 mmol/L (ref 98–111)
GFR calc Af Amer: >60 mL/min (ref >60)
GLUCOSE: 100 mg/dL — AB (ref 70–99)
Potassium: 3.4 mmol/L — ABNORMAL LOW (ref 3.5–5.1)
SODIUM: 140 mmol/L (ref 135–145)

## 2018-09-17 LAB — CBC WITH DIFFERENTIAL/PLATELET
Abs Immature Granulocytes: 0.02 10*3/uL (ref 0.00–0.07)
Basophils Absolute: 0 10*3/uL (ref 0.0–0.1)
Basophils Relative: 1 %
EOS ABS: 0.4 10*3/uL (ref 0.0–0.5)
Eosinophils Relative: 4 %
HCT: 40.1 % (ref 36.0–46.0)
Hemoglobin: 13 g/dL (ref 12.0–15.0)
Immature Granulocytes: 0 %
Lymphocytes Relative: 40 %
Lymphs Abs: 3.4 10*3/uL (ref 0.7–4.0)
MCH: 31.7 pg (ref 26.0–34.0)
MCHC: 32.4 g/dL (ref 30.0–36.0)
MCV: 97.8 fL (ref 80.0–100.0)
MONO ABS: 0.8 10*3/uL (ref 0.1–1.0)
Monocytes Relative: 10 %
Neutro Abs: 3.8 10*3/uL (ref 1.7–7.7)
Neutrophils Relative %: 45 %
Platelets: 250 10*3/uL (ref 150–400)
RBC: 4.1 MIL/uL (ref 3.87–5.11)
RDW: 12.8 % (ref 11.5–15.5)
WBC: 8.5 10*3/uL (ref 4.0–10.5)
nRBC: 0 % (ref 0.0–0.2)

## 2018-09-17 LAB — POC URINE PREG, ED: Preg Test, Ur: NEGATIVE

## 2018-09-17 LAB — RAPID HIV SCREEN (HIV 1/2 AB+AG)
HIV 1/2 Antibodies: NONREACTIVE
HIV-1 P24 ANTIGEN - HIV24: NONREACTIVE

## 2018-09-17 MED ORDER — CYCLOBENZAPRINE HCL 10 MG PO TABS: 10.0000 mg | ORAL_TABLET | Freq: Two times a day (BID) | ORAL | 0 refills | Status: DC | PRN

## 2018-09-17 MED ORDER — AZITHROMYCIN 250 MG PO TABS: ORAL_TABLET | ORAL | 0 refills | Status: DC

## 2018-09-17 MED ORDER — SODIUM CHLORIDE 0.9 % IV BOLUS: 1000.0000 mL | Freq: Once | INTRAVENOUS | Status: DC

## 2018-09-17 MED ORDER — PREDNISONE 50 MG PO TABS: ORAL_TABLET | ORAL | 0 refills | Status: DC

## 2018-09-17 NOTE — Discharge Instructions (Addendum)
Take the entire course of the antibiotics prescribed.  You are also still wheezing today, possibly because he did not complete the prednisone taper you are prescribed at your last visit here I am recommending that you take the prednisone taper as prescribed which can greatly help with your cough your wheezing and your shortness of breath.  Your labs are otherwise normal this evening as is your chest x-ray.  I am prescribing you a muscle relaxer to help you with the spasms in your legs.  You are not dehydrated based on your labs today, however continue increase your fluid intake to help with this symptom.

## 2018-09-18 NOTE — ED Provider Notes (Signed)
[facility] EMERGENCY DEPARTMENT Provider Note   CSN: [id number] Arrival date & time: 09/16/18  1934     History   Chief Complaint Chief Complaint  Patient presents with  . Fever    HPI Destiny Petty is a 37 y.o. female with a history of asthma, GERD, hypertension and other diagnoses as listed below presenting with a several week history of persistent illness.  She reports having flulike symptoms 2 weeks ago which she treated at home, stating she had no formal diagnosis of this but had generalized body aches, cough, sore throat and fatigue.  She was getting better when last week she developed worsening cough along with wheezing and shortness of breath.  She was seen here in diagnosed with acute bronchitis.  She was placed on prednisone and Tessalon, completing this course and reports not feeling much better.  She denies shortness of breath or chest pain, but continues to have a nonproductive cough along with wheezing.  Her main complaints this evening however include generalized fatigue and a deep aching pain in her bilateral thighs with associated intermittent muscle spasms.  She reports decreased p.o. intake, suspect she may be dehydrated.  She denies abdominal pain, nausea, vomiting but does endorse subjective fever which has been intermittent.  She has had no medication for her symptoms in the past several days and has found no alleviators.  Of note, she states that she and her husband are recently separated and with her recent frequent illnesses she is concerned about possibly being exposed to HIV and requests the screening test today.  The history is provided by the patient.    Past Medical History:  Diagnosis Date  . Allergy    SEASONAL  . 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 Anxiety   . Asthma   . Back pain with right-sided sciatica 06/18/2016  . Body mass index 36.0-36.9, adult 04/10/2016  . Body mass index 37.0-37.9, adult 03/13/2016  . Depression   . Dysmenorrhea 04/10/2016  .  GERD (gastroesophageal reflux disease)   . Hemorrhoids 02/10/2015  . Hiatal hernia   . Hyperlipidemia 04/09/2011  . Hypertension   . Menorrhagia with regular cycle 04/10/2016  . Muscle spasm of back 03/13/2016  . Obesity   . Ovarian cyst 12/18/2016  . Perianal wart 02/10/2015  . Stress 02/14/2016  . Weight loss counseling, encounter for 03/13/2016    Patient Active Problem List   Diagnosis Date Noted  . Ovarian cyst 12/18/2016  . 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 Numbness and tingling in left hand 08/19/2016  . Back pain with right-sided sciatica 06/18/2016  . Menorrhagia 04/10/2016  . Dysmenorrhea 04/10/2016  . Muscle spasm of back 03/13/2016  . Obesity 03/13/2016  . Weight loss counseling, encounter for 03/13/2016  . Stress 02/14/2016  . Bipolar disorder (HCC) 01/15/2016  . Hiatal hernia 01/15/2016  . Migraine 01/15/2016  . Tobacco use disorder 07/10/2013  . GERD (gastroesophageal reflux disease) 07/10/2013  . HTN (hypertension) 07/08/2013  . Asthma, mild persistent 04/05/2013  . Hyperlipidemia 04/09/2011  . Depressive disorder, not elsewhere classified 03/27/2007  . Allergic rhinitis 08/16/2003    Past Surgical History:  Procedure Laterality Date  . CESAREAN SECTION    . CHOLECYSTECTOMY    . COLONOSCOPY    . ESOPHAGOGASTRODUODENOSCOPY ENDOSCOPY    . TUBAL LIGATION       OB History    Gravida  2   Para  2   Term  2   Preterm  AB      Living  2     SAB      TAB      Ectopic      Multiple      Live Births  2            Home Medications    Prior to Admission medications   Medication Sig Start Date End Date Taking? Authorizing Provider  azithromycin (ZITHROMAX Z-PAK) 250 MG tablet Take 2 tablets by mouth on day one followed by one tablet daily for 4 days. 09/17/18   [name], PA-C  benzonatate (TESSALON) 200 MG capsule Take 1 capsule (200 mg total) by mouth every 8 (eight) hours. Swallow whole, do not chew Patient not taking:  Reported on 09/16/2018 09/06/18   Triplett, Tammy, PA-C  cyclobenzaprine (FLEXERIL) 10 MG tablet Take 1 tablet (10 mg total) by mouth 2 (two) times daily as needed for muscle spasms. 09/17/18   [name], PA-C  diphenhydrAMINE (BENADRYL) 25 MG tablet Take 1 tablet (25 mg total) by mouth every 4 (four) hours as needed. Patient not taking: Reported on 09/16/2018 08/14/18   Pauline Aus, PA-C  predniSONE (DELTASONE) 50 MG tablet Take 1 tablet daily for 5 days 09/17/18   [name], PA-C  triamcinolone cream (KENALOG) 0.1 % Apply 1 application topically 3 (three) times daily. Patient not taking: Reported on 09/16/2018 08/14/18   Pauline Aus, PA-C    Family History Family History  Problem Relation Age of Onset  . Diabetes Mother   . Kidney failure Mother   . Early death Mother 61       diabetes complications  . Cancer Father 44       colon cancer  . Depression Sister   . Hypertension Sister   . Anxiety disorder Sister   . Other Sister        back problems  . ADD / ADHD Daughter   . Other Daughter        behavioral issues  . ODD Daughter   . Asthma Son   . Cancer Maternal Grandmother        breast  . Seizures Maternal Grandmother   . Lupus Other     Social History Social History   Tobacco Use  . Smoking status: Current Every Day Smoker    Packs/day: 1.00    Years: 20.00    Pack years: 20.00    Types: Cigarettes  . Smokeless tobacco: Never Used  . Tobacco comment: trying to quit  Substance Use Topics  . Alcohol use: Yes    Comment: occ  . Drug use: No     Allergies   Latex and Lisinopril   Review of Systems Review of Systems  Constitutional: Positive for chills, fatigue and fever.  HENT: Negative for congestion, ear pain, rhinorrhea and sore throat.   Eyes: Negative.  Negative for discharge.  Respiratory: Positive for cough and wheezing. Negative for chest tightness and shortness of breath.   Cardiovascular: Negative for chest pain.  Gastrointestinal:  Negative for abdominal pain, diarrhea, nausea and vomiting.  Genitourinary: Negative.  Negative for dysuria.  Musculoskeletal: Positive for arthralgias. Negative for joint swelling and neck pain.  Skin: Negative.  Negative for rash and wound.  Neurological: Negative for dizziness, weakness, light-headedness, numbness and headaches.  Psychiatric/Behavioral: Negative.      Physical Exam Updated Vital Signs BP 118/60 (BP Location: Left Arm)   Pulse 97   Temp 98 F (36.7 C) (Oral)   Resp 18  Ht 5\' 1"  (1.549 m)   Wt 87 kg   LMP 08/15/2018   SpO2 99%   BMI 36.24 kg/m   Physical Exam  Constitutional: She appears well-developed and well-nourished.  HENT:  Head: Normocephalic and atraumatic.  Mouth/Throat: Oropharynx is clear and moist.  Eyes: Conjunctivae are normal.  Neck: Normal range of motion.  Cardiovascular: Normal rate, regular rhythm, normal heart sounds and intact distal pulses.  Pulses:      Radial pulses are 2+ on the right side, and 2+ on the left side.       Dorsalis pedis pulses are 2+ on the right side, and 2+ on the left side.  Borderline tachycardia.  Pulmonary/Chest: Effort normal and breath sounds normal. She has no wheezes.  Abdominal: Soft. Bowel sounds are normal. She exhibits no mass. There is no tenderness. There is no guarding.  Musculoskeletal: Normal range of motion. She exhibits tenderness.  Generalized tenderness to palpation of her legs including her thighs and calves.  There is no edema, no induration, no palpable cords or lesions.  No rash.  During interview she developed a spasm in her right thigh which was not appreciable on exam.  Neurological: She is alert.  Skin: Skin is warm and dry. No rash noted.  Psychiatric: She has a normal mood and affect.  Nursing note and vitals reviewed.    ED Treatments / Results  Labs (all labs ordered are listed, but only abnormal results are displayed) Labs Reviewed  BASIC METABOLIC PANEL - Abnormal;  Notable for the following components:      Result Value   Potassium 3.4 (*)    Glucose, Bld 100 (*)    Calcium 8.5 (*)    All other components within normal limits  CBC WITH DIFFERENTIAL/PLATELET  RAPID HIV SCREEN (HIV 1/2 AB+AG)  POC URINE PREG, ED    EKG None  Radiology Dg Chest 2 View  Result Date: 09/17/2018 CLINICAL DATA:  Patient states fever, weakness, cough with yellow phlegm, and generalized body aches x 3 weeks. States she was diagnosed with the flu 3 weeks ago and then bronchitis 2 weeks ago. EXAM: CHEST - 2 VIEW COMPARISON:  09/06/2018 FINDINGS: The lungs appear clear.  Cardiac and mediastinal contours normal. No pleural effusion identified. Mild thoracic spondylosis. IMPRESSION: 1.  No active cardiopulmonary disease is radiographically apparent. Electronically Signed   By: Gaylyn Rong M.D.   On: 09/17/2018 01:16    Procedures Procedures (including critical care time)  Medications Ordered in ED Medications  sodium chloride 0.9 % bolus 1,000 mL ( Intravenous Paused 09/17/18 0120)  cyclobenzaprine (FLEXERIL) tablet 10 mg (10 mg Oral Given 09/17/18 0008)  ketorolac (TORADOL) 30 MG/ML injection 15 mg (15 mg Intravenous Given 09/17/18 0007)     Initial Impression / Assessment and Plan / ED Course  I have reviewed the triage vital signs and the nursing notes.  Pertinent labs & imaging results that were available during my care of the patient were reviewed by me and considered in my medical decision making (see chart for details).     Pt with persistent cough with wheezing with upper respiratory infection.  No clinical or lab findings to suggest dehydration.  She was given IV fluids, toradol and flexeril given with improvement in body aches and muscle spasm.  Pt started on abx given persistent cough/bronchitis in a smoker. Prednisone taper encouraged, was prescribed at last visit but did not finish. Plan f/u with pcp for persistent or worsened sx.   Final  Clinical  Impressions(s) / ED Diagnoses   Final diagnoses:  Acute bronchitis, unspecified organism    ED Discharge Orders         Ordered    predniSONE (DELTASONE) 50 MG tablet  Status:  Discontinued     09/17/18 0125    azithromycin (ZITHROMAX Z-PAK) 250 MG tablet     09/17/18 0125    cyclobenzaprine (FLEXERIL) 10 MG tablet  2 times daily PRN,   Status:  Discontinued     09/17/18 0125    cyclobenzaprine (FLEXERIL) 10 MG tablet  2 times daily PRN     09/17/18 0126    predniSONE (DELTASONE) 50 MG tablet     09/17/18 0126           [name], PA-C 09/18/18 2132    Zadie Rhine, MD 09/19/18 0001

## 2018-11-20 ENCOUNTER — Other Ambulatory Visit: Payer: Self-pay

## 2018-11-20 ENCOUNTER — Encounter (HOSPITAL_COMMUNITY): Payer: Self-pay | Admitting: *Deleted

## 2018-11-20 ENCOUNTER — Inpatient Hospital Stay (HOSPITAL_COMMUNITY)
Admission: AD | Admit: 2018-11-20 | Discharge: 2018-11-20 | Disposition: A | Discharge status: Home / Self Care | Payer: Self-pay | Attending: Obstetrics & Gynecology | Admitting: Obstetrics & Gynecology

## 2018-11-20 ENCOUNTER — Inpatient Hospital Stay (HOSPITAL_COMMUNITY): Payer: Self-pay

## 2018-11-20 DIAGNOSIS — R1030 Lower abdominal pain, unspecified: Secondary | ICD-10-CM

## 2018-11-20 DIAGNOSIS — B9689 Other specified bacterial agents as the cause of diseases classified elsewhere: Secondary | ICD-10-CM | POA: Insufficient documentation

## 2018-11-20 DIAGNOSIS — M549 Dorsalgia, unspecified: Secondary | ICD-10-CM | POA: Insufficient documentation

## 2018-11-20 DIAGNOSIS — N939 Abnormal uterine and vaginal bleeding, unspecified: Secondary | ICD-10-CM | POA: Insufficient documentation

## 2018-11-20 DIAGNOSIS — R1031 Right lower quadrant pain: Secondary | ICD-10-CM | POA: Insufficient documentation

## 2018-11-20 DIAGNOSIS — R102 Pelvic and perineal pain: Secondary | ICD-10-CM

## 2018-11-20 DIAGNOSIS — N760 Acute vaginitis: Secondary | ICD-10-CM | POA: Insufficient documentation

## 2018-11-20 LAB — CBC WITH DIFFERENTIAL/PLATELET
Basophils Absolute: 0 10*3/uL (ref 0.0–0.1)
Basophils Relative: 0 %
Eosinophils Absolute: 0.6 10*3/uL — ABNORMAL HIGH (ref 0.0–0.5)
Eosinophils Relative: 6 %
HCT: 41.5 % (ref 36.0–46.0)
Hemoglobin: 13.6 g/dL (ref 12.0–15.0)
Lymphocytes Relative: 28 %
Lymphs Abs: 3.2 10*3/uL (ref 0.7–4.0)
MCH: 32.4 pg (ref 26.0–34.0)
MCHC: 32.8 g/dL (ref 30.0–36.0)
MCV: 98.8 fL (ref 80.0–100.0)
Monocytes Absolute: 0.8 10*3/uL (ref 0.1–1.0)
Monocytes Relative: 7 %
Neutro Abs: 6.6 10*3/uL (ref 1.7–7.7)
Neutrophils Relative %: 59 %
Platelets: 233 10*3/uL (ref 150–400)
RBC: 4.2 MIL/uL (ref 3.87–5.11)
RDW: 12.8 % (ref 11.5–15.5)
WBC: 11.2 10*3/uL — ABNORMAL HIGH (ref 4.0–10.5)
nRBC: 0 % (ref 0.0–0.2)

## 2018-11-20 LAB — AMYLASE: Amylase: 30 U/L (ref 28–100)

## 2018-11-20 LAB — LIPASE, BLOOD: Lipase: 27 U/L (ref 11–51)

## 2018-11-20 LAB — URINALYSIS, ROUTINE W REFLEX MICROSCOPIC
Bilirubin Urine: NEGATIVE
Glucose, UA: NEGATIVE mg/dL
Hgb urine dipstick: NEGATIVE
Ketones, ur: NEGATIVE mg/dL
Leukocytes, UA: NEGATIVE
Nitrite: NEGATIVE
Protein, ur: NEGATIVE mg/dL
Specific Gravity, Urine: 1.017 (ref 1.005–1.030)
pH: 6 (ref 5.0–8.0)

## 2018-11-20 LAB — WET PREP, GENITAL
Sperm: NONE SEEN
Trich, Wet Prep: NONE SEEN
Yeast Wet Prep HPF POC: NONE SEEN

## 2018-11-20 LAB — POCT PREGNANCY, URINE: Preg Test, Ur: NEGATIVE

## 2018-11-20 MED ORDER — KETOROLAC TROMETHAMINE 60 MG/2ML IM SOLN
60.0000 mg | Freq: Once | INTRAMUSCULAR | Status: AC
  Administered 2018-11-20: 60 mg via INTRAMUSCULAR
  Filled 2018-11-20: qty 2

## 2018-11-20 MED ORDER — METRONIDAZOLE 500 MG PO TABS: 500.0000 mg | ORAL_TABLET | Freq: Two times a day (BID) | ORAL | 0 refills | Status: DC

## 2018-11-20 MED ORDER — KETOROLAC TROMETHAMINE 10 MG PO TABS: 10.0000 mg | ORAL_TABLET | Freq: Four times a day (QID) | ORAL | 0 refills | Status: DC | PRN

## 2018-11-20 NOTE — MAU Provider Note (Addendum)
History     CSN: [id number]  Arrival date and time: 11/20/18 1716   First Provider Initiated Contact with Patient 11/20/18 1838      Chief Complaint  Patient presents with  . Vaginal Bleeding  . Abdominal Pain  . Back Pain   Destiny Petty is a 37 y.o. G2P2 not currently pregnant who presents to MAU with complaints of vaginal bleeding, abdominal pain and back pain for the past month. She reports vaginal bleeding started occurring on November 6th and continued to bleed for 1 month then stopped bleeding. Denies being on birth control and was not seen for AUB. Since extended period she reports abdominal pain and back pain. Describes the abdominal pain as "feel like ovary and tube is blocked on right", throbbing pressure rates 10/10, did not take any medication prior to arrival. Abdominal pain is specific to right lower pelvis and radiates around to back. She denies changes in partners or recent IC. Hx of tubal ligation 12 years ago. Patient drove self to MAU.   OB History    Gravida  2   Para  2   Term  2   Preterm      AB      Living  2     SAB      TAB      Ectopic      Multiple      Live Births  2           Past Medical History:  Diagnosis Date  . Allergy    SEASONAL  . 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 Anxiety   . Asthma   . Back pain with right-sided sciatica 06/18/2016  . Body mass index 36.0-36.9, adult 04/10/2016  . Body mass index 37.0-37.9, adult 03/13/2016  . Depression   . Dysmenorrhea 04/10/2016  . GERD (gastroesophageal reflux disease)   . Hemorrhoids 02/10/2015  . Hiatal hernia   . Hyperlipidemia 04/09/2011  . Hypertension   . Menorrhagia with regular cycle 04/10/2016  . Muscle spasm of back 03/13/2016  . Obesity   . Ovarian cyst 12/18/2016  . Perianal wart 02/10/2015  . Stress 02/14/2016  . Weight loss counseling, encounter for 03/13/2016    Past Surgical History:  Procedure Laterality Date  . CESAREAN SECTION    . CHOLECYSTECTOMY    .  COLONOSCOPY    . ESOPHAGOGASTRODUODENOSCOPY ENDOSCOPY    . TUBAL LIGATION      Family History  Problem Relation Age of Onset  . Diabetes Mother   . Kidney failure Mother   . Early death Mother 45       diabetes complications  . Cancer Father 15       colon cancer  . Depression Sister   . Hypertension Sister   . Anxiety disorder Sister   . Other Sister        back problems  . ADD / ADHD Daughter   . Other Daughter        behavioral issues  . ODD Daughter   . Asthma Son   . Cancer Maternal Grandmother        breast  . Seizures Maternal Grandmother   . Lupus Other     Social History   Tobacco Use  . Smoking status: Current Every Day Smoker    Packs/day: 1.00    Years: 20.00    Pack years: 20.00    Types: Cigarettes  . Smokeless tobacco: Never Used  . Tobacco comment: trying to  quit  Substance Use Topics  . Alcohol use: Yes    Comment: occ  . Drug use: No    Allergies:  Allergies  Allergen Reactions  . Latex Itching  . Lisinopril Cough    coughing    Medications Prior to Admission  Medication Sig Dispense Refill Last Dose  . azithromycin (ZITHROMAX Z-PAK) 250 MG tablet Take 2 tablets by mouth on day one followed by one tablet daily for 4 days. 6 each 0   . benzonatate (TESSALON) 200 MG capsule Take 1 capsule (200 mg total) by mouth every 8 (eight) hours. Swallow whole, do not chew (Patient not taking: Reported on 09/16/2018) 21 capsule 0 Not Taking at Unknown time  . cyclobenzaprine (FLEXERIL) 10 MG tablet Take 1 tablet (10 mg total) by mouth 2 (two) times daily as needed for muscle spasms. 20 tablet 0   . diphenhydrAMINE (BENADRYL) 25 MG tablet Take 1 tablet (25 mg total) by mouth every 4 (four) hours as needed. (Patient not taking: Reported on 09/16/2018) 20 tablet 0 Not Taking at Unknown time  . predniSONE (DELTASONE) 50 MG tablet Take 1 tablet daily for 5 days 5 tablet 0   . triamcinolone cream (KENALOG) 0.1 % Apply 1 application topically 3 (three) times  daily. (Patient not taking: Reported on 09/16/2018) 30 g 0 Not Taking at Unknown time    Review of Systems  Constitutional: Negative.   Respiratory: Negative.   Cardiovascular: Negative.   Gastrointestinal: Positive for abdominal pain. Negative for constipation, diarrhea, nausea and vomiting.  Genitourinary: Positive for pelvic pain and vaginal bleeding. Negative for difficulty urinating, dysuria and frequency.       No vaginal bleeding currently  Musculoskeletal: Positive for back pain.  Neurological: Negative.    Physical Exam   Blood pressure (!) 137/96, pulse 97, temperature (!) 97.5 F (36.4 C), temperature source Oral, resp. rate 20, weight 87.2 kg, last menstrual period 10/19/2018, SpO2 96 %.  Physical Exam  Nursing note and vitals reviewed. Constitutional: She is oriented to person, place, and time. She appears well-developed and well-nourished. She appears distressed.  Cardiovascular: Normal rate, regular rhythm and normal heart sounds.  Respiratory: Effort normal and breath sounds normal. No respiratory distress. She has no wheezes. She has no rales.  GI: Soft. Bowel sounds are normal. She exhibits no distension. There is abdominal tenderness. There is no rebound and no guarding.  Genitourinary:    No vaginal discharge or bleeding.  No bleeding in the vagina.    Genitourinary Comments: Pelvic examination: cervix pink, visually closed, no abnormal discharge, no vaginal bleeding present, uterus tender, no abnormalities of adnexa palpated, right lower quadrant tender to palpation    Musculoskeletal:        General: No edema.  Neurological: She is alert and oriented to person, place, and time.    MAU Course  Procedures  MDM Orders Placed This Encounter  Procedures  . Wet prep, genital  . US PELVIC COMPLETE W TRANSVAGINAL AND TORSION R/O  . Urinalysis, Routine w reflex microscopic  . CBC with Differential/Platelet  . Lipase, blood  . Amylase  . Pregnancy, urine POC    Results for orders placed or performed during the hospital encounter of 11/20/18 (from the past 24 hour(s))  Urinalysis, Routine w reflex microscopic     Status: None   Collection Time: 11/20/18  5:43 PM  Result Value Ref Range   Color, Urine YELLOW YELLOW   APPearance CLEAR CLEAR   Specific Gravity, Urine 1.017 1.005 -  1.030   pH 6.0 5.0 - 8.0   Glucose, UA NEGATIVE NEGATIVE mg/dL   Hgb urine dipstick NEGATIVE NEGATIVE   Bilirubin Urine NEGATIVE NEGATIVE   Ketones, ur NEGATIVE NEGATIVE mg/dL   Protein, ur NEGATIVE NEGATIVE mg/dL   Nitrite NEGATIVE NEGATIVE   Leukocytes, UA NEGATIVE NEGATIVE  Pregnancy, urine POC     Status: None   Collection Time: 11/20/18  5:46 PM  Result Value Ref Range   Preg Test, Ur NEGATIVE NEGATIVE  Wet prep, genital     Status: Abnormal   Collection Time: 11/20/18  6:52 PM  Result Value Ref Range   Yeast Wet Prep HPF POC NONE SEEN NONE SEEN   Trich, Wet Prep NONE SEEN NONE SEEN   Clue Cells Wet Prep HPF POC PRESENT (A) NONE SEEN   WBC, Wet Prep HPF POC FEW (A) NONE SEEN   Sperm NONE SEEN   CBC with Differential/Platelet     Status: Abnormal   Collection Time: 11/20/18  7:13 PM  Result Value Ref Range   WBC 11.2 (H) 4.0 - 10.5 K/uL   RBC 4.20 3.87 - 5.11 MIL/uL   Hemoglobin 13.6 12.0 - 15.0 g/dL   HCT 16.1 09.6 - 04.5 %   MCV 98.8 80.0 - 100.0 fL   MCH 32.4 26.0 - 34.0 pg   MCHC 32.8 30.0 - 36.0 g/dL   RDW 40.9 81.1 - 91.4 %   Platelets 233 150 - 400 K/uL   nRBC 0.0 0.0 - 0.2 %   Neutrophils Relative % 59 %   Neutro Abs 6.6 1.7 - 7.7 K/uL   Lymphocytes Relative 28 %   Lymphs Abs 3.2 0.7 - 4.0 K/uL   Monocytes Relative 7 %   Monocytes Absolute 0.8 0.1 - 1.0 K/uL   Eosinophils Relative 6 %   Eosinophils Absolute 0.6 (H) 0.0 - 0.5 K/uL   Basophils Relative 0 %   Basophils Absolute 0.0 0.0 - 0.1 K/uL  Lipase, blood     Status: None   Collection Time: 11/20/18  7:13 PM  Result Value Ref Range   Lipase 27 11 - 51 U/L  Amylase     Status:  None   Collection Time: 11/20/18  7:13 PM  Result Value Ref Range   Amylase 30 28 - 100 U/L   Labs WNL  [place] pending   Care taken over by [facility] CNM @ [address] CNM 11/20/18, 7:49 PM   Care assumed from Lanice Shirts, CNM  Patient sleeping upon Provider introduction Pain responsive to IM Toradol  Labs and ultrasound report reviewed Patient history significant for muscle spasm as well as back pain with right-side sciatica   US Pelvic Complete W Transvaginal And Torsion R/o  Result Date: 11/20/2018 CLINICAL DATA:  37 year old female with right lower quadrant and pelvic pain. Negative urine pregnancy test. Unknown LMP. EXAM: TRANSABDOMINAL AND TRANSVAGINAL ULTRASOUND OF PELVIS DOPPLER ULTRASOUND OF OVARIES TECHNIQUE: Both transabdominal and transvaginal ultrasound examinations of the pelvis were performed. Transabdominal technique was performed for global imaging of the pelvis including uterus, ovaries, adnexal regions, and pelvic cul-de-sac. It was necessary to proceed with endovaginal exam following the transabdominal exam to visualize the ovaries. Color and duplex Doppler ultrasound was utilized to evaluate blood flow to the ovaries. COMPARISON:  None. FINDINGS: Uterus Measurements: 9.4 x 4.5 x 5.3 centimeters = volume: 116 mL. No fibroids or other mass visualized. Endometrium Thickness: 10 millimeters.  No focal abnormality visualized. Right ovary Measurements: 3.2 x 2.1  x 3.1 centimeters = volume: 11 mL. Normal appearance/no adnexal mass. Left ovary Measurements: 2.5 x 1.9 x 2.0 centimeters = volume: 5 mL. Normal appearance/no adnexal mass. Pulsed Doppler evaluation of both ovaries demonstrates normal low-resistance arterial and venous waveforms. Other findings No pelvic free fluid. IMPRESSION: Normal pelvis ultrasound with Doppler. Negative for ovarian torsion. Electronically Signed   By: Odessa FlemingH  Hall M.D.   On: 11/20/2018 20:40     Meds ordered this encounter  Medications  .  ketorolac (TORADOL) injection 60 mg  . ketorolac (TORADOL) 10 MG tablet    Sig: Take 1 tablet (10 mg total) by mouth every 6 (six) hours as needed.    Dispense:  20 tablet    Refill:  0    Order Specific Question:   Supervising Provider    Answer:   Reva BoresPRATT, TANYA S [[id number]]  . metroNIDAZOLE (FLAGYL) 500 MG tablet    Sig: Take 1 tablet (500 mg total) by mouth 2 (two) times daily.    Dispense:  14 tablet    Refill:  0    Order Specific Question:   Supervising Provider    Answer:   Reva BoresPRATT, TANYA S [[id number]]   Assessment and Plan  --37 y.o. G2P2002 abdominal pain --Pain responsive to meds given in MAU, short course rx to pharmacy --Bacterial vaginosis, rx to pharmacy --Discharge home in stable condition  F[facility]U: [facility]   , CNM 11/20/18  9:49 PM

## 2018-11-20 NOTE — MAU Note (Signed)
Been having this pain for about a month.  Feels like something is in her rt ovary, cramps and pressure is all above her 'privacy parts'.  It making even her back hurt.  Youngest child is 10,had a tubal ligation at that time.  Periods have been pretty bad the last 5 yrs.  Here recently has been bleeding for a month, but not like a full blown period, stopped about 4 days ago.   Nipples have been tender to touch for a month.

## 2018-11-20 NOTE — Discharge Instructions (Signed)

## 2018-11-23 LAB — GC/CHLAMYDIA PROBE AMP (~~LOC~~) NOT AT ARMC
Chlamydia: NEGATIVE
Neisseria Gonorrhea: NEGATIVE

## 2019-01-13 ENCOUNTER — Other Ambulatory Visit: Payer: Self-pay

## 2019-01-13 ENCOUNTER — Encounter (HOSPITAL_COMMUNITY): Payer: Self-pay | Admitting: *Deleted

## 2019-01-13 DIAGNOSIS — Z79899 Other long term (current) drug therapy: Secondary | ICD-10-CM | POA: Insufficient documentation

## 2019-01-13 DIAGNOSIS — J0110 Acute frontal sinusitis, unspecified: Secondary | ICD-10-CM | POA: Insufficient documentation

## 2019-01-13 DIAGNOSIS — F17210 Nicotine dependence, cigarettes, uncomplicated: Secondary | ICD-10-CM | POA: Insufficient documentation

## 2019-01-13 DIAGNOSIS — J45909 Unspecified asthma, uncomplicated: Secondary | ICD-10-CM | POA: Insufficient documentation

## 2019-01-13 DIAGNOSIS — I10 Essential (primary) hypertension: Secondary | ICD-10-CM | POA: Insufficient documentation

## 2019-01-13 DIAGNOSIS — Z91040 Latex allergy status: Secondary | ICD-10-CM | POA: Insufficient documentation

## 2019-01-13 NOTE — ED Triage Notes (Signed)
Pt c/o diarrhea with headache and eye pain and chills with generalized body aches

## 2019-01-14 ENCOUNTER — Emergency Department (HOSPITAL_COMMUNITY)
Admission: EM | Admit: 2019-01-14 | Discharge: 2019-01-14 | Disposition: A | Discharge status: Home / Self Care | Payer: Self-pay | Attending: Emergency Medicine | Admitting: Emergency Medicine

## 2019-01-14 DIAGNOSIS — J0110 Acute frontal sinusitis, unspecified: Secondary | ICD-10-CM

## 2019-01-14 MED ORDER — AZITHROMYCIN 250 MG PO TABS
500.0000 mg | ORAL_TABLET | Freq: Once | ORAL | Status: AC
  Administered 2019-01-14: 500 mg via ORAL
  Filled 2019-01-14: qty 2

## 2019-01-14 MED ORDER — AZITHROMYCIN 250 MG PO TABS: 250.0000 mg | ORAL_TABLET | Freq: Every day | ORAL | 0 refills | Status: DC

## 2019-01-14 NOTE — ED Notes (Signed)
ED Provider at bedside. 

## 2019-01-14 NOTE — ED Provider Notes (Signed)
[facility] EMERGENCY DEPARTMENT Provider Note   CSN: [id number] Arrival date & time: 01/13/19  2239     History   Chief Complaint Chief Complaint  Patient presents with  . Generalized Body Aches    HPI Tanyia Hyre is a 38 y.o. female.  Patient is a 38 year old female with past medical history of hypertension, depression, asthma, and recurrent sinus infections.  She presents today with complaints of cough, congestion, and fever for the past week.  She is now having sinus pressure and having greenish nasal discharge.  She is concerned she is experiencing another sinus infection.  She denies chest pain or difficulty breathing.  She denies ill contacts.  She has taken over-the-counter medications with little relief.  The history is provided by the patient.    Past Medical History:  Diagnosis Date  . Allergy    SEASONAL  . 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 Anxiety   . Asthma   . Back pain with right-sided sciatica 06/18/2016  . Body mass index 36.0-36.9, adult 04/10/2016  . Body mass index 37.0-37.9, adult 03/13/2016  . Depression   . Dysmenorrhea 04/10/2016  . GERD (gastroesophageal reflux disease)   . Hemorrhoids 02/10/2015  . Hiatal hernia   . Hyperlipidemia 04/09/2011  . Hypertension   . Menorrhagia with regular cycle 04/10/2016  . Muscle spasm of back 03/13/2016  . Obesity   . Ovarian cyst 12/18/2016  . Perianal wart 02/10/2015  . Stress 02/14/2016  . Weight loss counseling, encounter for 03/13/2016    Patient Active Problem List   Diagnosis Date Noted  . Ovarian cyst 12/18/2016  . 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 Numbness and tingling in left hand 08/19/2016  . Back pain with right-sided sciatica 06/18/2016  . Menorrhagia 04/10/2016  . Dysmenorrhea 04/10/2016  . Muscle spasm of back 03/13/2016  . Obesity 03/13/2016  . Weight loss counseling, encounter for 03/13/2016  . Stress 02/14/2016  . Bipolar disorder (HCC) 01/15/2016  . Hiatal hernia  01/15/2016  . Migraine 01/15/2016  . Tobacco use disorder 07/10/2013  . GERD (gastroesophageal reflux disease) 07/10/2013  . HTN (hypertension) 07/08/2013  . Asthma, mild persistent 04/05/2013  . Hyperlipidemia 04/09/2011  . Depressive disorder, not elsewhere classified 03/27/2007  . Allergic rhinitis 08/16/2003    Past Surgical History:  Procedure Laterality Date  . CESAREAN SECTION    . CHOLECYSTECTOMY    . COLONOSCOPY    . ESOPHAGOGASTRODUODENOSCOPY ENDOSCOPY    . TUBAL LIGATION       OB History    Gravida  2   Para  2   Term  2   Preterm      AB      Living  2     SAB      TAB      Ectopic      Multiple      Live Births  2            Home Medications    Prior to Admission medications   Medication Sig Start Date End Date Taking? Authorizing Provider  cyclobenzaprine (FLEXERIL) 10 MG tablet Take 1 tablet (10 mg total) by mouth 2 (two) times daily as needed for muscle spasms. 09/17/18   [name], PA-C  ketorolac (TORADOL) 10 MG tablet Take 1 tablet (10 mg total) by mouth every 6 (six) hours as needed. 11/20/18   Calvert Cantor, CNM  metroNIDAZOLE (FLAGYL) 500 MG tablet Take 1 tablet (500 mg total) by  mouth 2 (two) times daily. 11/20/18   Calvert Cantor, CNM    Family History Family History  Problem Relation Age of Onset  . Diabetes Mother   . Kidney failure Mother   . Early death Mother 65       diabetes complications  . Cancer Father 87       colon cancer  . Depression Sister   . Hypertension Sister   . Anxiety disorder Sister   . Other Sister        back problems  . ADD / ADHD Daughter   . Other Daughter        behavioral issues  . ODD Daughter   . Asthma Son   . Cancer Maternal Grandmother        breast  . Seizures Maternal Grandmother   . Lupus Other     Social History Social History   Tobacco Use  . Smoking status: Current Every Day Smoker    Packs/day: 1.00    Years: 20.00    Pack years: 20.00    Types:  Cigarettes  . Smokeless tobacco: Never Used  . Tobacco comment: trying to quit  Substance Use Topics  . Alcohol use: Yes    Comment: occ  . Drug use: No     Allergies   Latex and Lisinopril   Review of Systems Review of Systems  All other systems reviewed and are negative.    Physical Exam Updated Vital Signs BP 139/88 (BP Location: Right Arm)   Pulse 85   Temp 97.7 F (36.5 C) (Oral)   Resp 20   Ht 5\' 1"  (1.549 m)   Wt 88.2 kg   SpO2 99%   BMI 36.75 kg/m   Physical Exam Vitals signs and nursing note reviewed.  Constitutional:      General: She is not in acute distress.    Appearance: She is well-developed. She is not diaphoretic.  HENT:     Head: Normocephalic and atraumatic.     Right Ear: Tympanic membrane normal.     Left Ear: Tympanic membrane normal.     Nose: Congestion present.     Mouth/Throat:     Mouth: Mucous membranes are moist.     Pharynx: No oropharyngeal exudate or posterior oropharyngeal erythema.  Neck:     Musculoskeletal: Normal range of motion and neck supple.  Cardiovascular:     Rate and Rhythm: Normal rate and regular rhythm.     Heart sounds: No murmur. No friction rub. No gallop.   Pulmonary:     Effort: Pulmonary effort is normal. No respiratory distress.     Breath sounds: Normal breath sounds. No wheezing.  Abdominal:     General: Bowel sounds are normal. There is no distension.     Palpations: Abdomen is soft.     Tenderness: There is no abdominal tenderness.  Musculoskeletal: Normal range of motion.  Skin:    General: Skin is warm and dry.  Neurological:     Mental Status: She is alert and oriented to person, place, and time.      ED Treatments / Results  Labs (all labs ordered are listed, but only abnormal results are displayed) Labs Reviewed - No data to display  EKG None  Radiology No results found.  Procedures Procedures (including critical care time)  Medications Ordered in ED Medications    azithromycin (ZITHROMAX) tablet 500 mg (has no administration in time range)     Initial Impression / Assessment and  Plan / ED Course  I have reviewed the triage vital signs and the nursing notes.  Pertinent labs & imaging results that were available during my care of the patient were reviewed by me and considered in my medical decision making (see chart for details).  Patient symptoms most consistent with acute sinusitis.  She will be treated with Zithromax, continued over-the-counter medications, and return as needed.  Final Clinical Impressions(s) / ED Diagnoses   Final diagnoses:  None    ED Discharge Orders    None       Geoffery Lyonselo, Silva Aamodt, MD 01/14/19 [phone]

## 2019-01-14 NOTE — Discharge Instructions (Addendum)
Zithromax as prescribed  Tylenol 1000 mg rotated with Motrin 600 mg every 4 hours as needed for pain or fever.  Return to the emergency department if you develop difficulty breathing, severe chest pain, or other new and concerning symptoms.

## 2019-01-26 ENCOUNTER — Emergency Department (HOSPITAL_COMMUNITY): Payer: Self-pay

## 2019-01-26 ENCOUNTER — Other Ambulatory Visit: Payer: Self-pay

## 2019-01-26 ENCOUNTER — Emergency Department (HOSPITAL_COMMUNITY)
Admission: EM | Admit: 2019-01-26 | Discharge: 2019-01-26 | Disposition: A | Discharge status: Home / Self Care | Payer: Self-pay | Attending: Emergency Medicine | Admitting: Emergency Medicine

## 2019-01-26 DIAGNOSIS — J0101 Acute recurrent maxillary sinusitis: Secondary | ICD-10-CM | POA: Insufficient documentation

## 2019-01-26 DIAGNOSIS — I10 Essential (primary) hypertension: Secondary | ICD-10-CM | POA: Insufficient documentation

## 2019-01-26 DIAGNOSIS — B9789 Other viral agents as the cause of diseases classified elsewhere: Secondary | ICD-10-CM

## 2019-01-26 DIAGNOSIS — J069 Acute upper respiratory infection, unspecified: Secondary | ICD-10-CM | POA: Insufficient documentation

## 2019-01-26 DIAGNOSIS — Z91040 Latex allergy status: Secondary | ICD-10-CM | POA: Insufficient documentation

## 2019-01-26 DIAGNOSIS — F17210 Nicotine dependence, cigarettes, uncomplicated: Secondary | ICD-10-CM | POA: Insufficient documentation

## 2019-01-26 DIAGNOSIS — J45909 Unspecified asthma, uncomplicated: Secondary | ICD-10-CM | POA: Insufficient documentation

## 2019-01-26 MED ORDER — AMOXICILLIN-POT CLAVULANATE 875-125 MG PO TABS: 1.0000 | ORAL_TABLET | Freq: Two times a day (BID) | ORAL | 0 refills | Status: AC

## 2019-01-26 MED ORDER — ALBUTEROL SULFATE HFA 108 (90 BASE) MCG/ACT IN AERS: 1.0000 | INHALATION_SPRAY | Freq: Four times a day (QID) | RESPIRATORY_TRACT | 0 refills | Status: DC | PRN

## 2019-01-26 MED ORDER — PREDNISONE 10 MG (21) PO TBPK: ORAL_TABLET | ORAL | 0 refills | Status: DC

## 2019-01-26 NOTE — ED Notes (Signed)
Patient transported to X-ray 

## 2019-01-26 NOTE — ED Provider Notes (Signed)
[facility] EMERGENCY DEPARTMENT Provider Note   CSN: [id number] Arrival date & time: 01/26/19  1055  History   Chief Complaint Chief Complaint  Patient presents with  . Facial Pain   HPI Destiny Petty is a 38 y.o. female with past medical history significant for anxiety, asthma, hypertension, hyperlipidemia who presents for evaluation of sinus congestion and cough.  Patient states she was diagnosed 2 weeks ago with acute sinusitis.  Was given azithromycin.  Patient states she initially felt better after antibiotics, however few days after cessation of antibiotics she developed a severe sinus tenderness as well as green nasal discharge.  Patient states she is also had pressure behind her bilateral ears.  Patient states she has had a nonproductive cough x1 week.  States she is concerned that she feels she has pneumonia or bronchitis as she has gets pneumonia once a year.  Patient states she has been around multiple sick contacts.  Patient states she has felt warm, offers not taken her temperature.  Did not received influenza vaccine.  Has been able to tolerate p.o. intake without difficulty.  Denies headache, vision changes, sore throat, neck pain, neck stiffness, chest pain, shortness of breath, abdominal pain, nausea, vomiting, diarrhea, dysuria, pelvic pain.  Has tried Mucinex DM for her symptoms without relief.  Denies additional aggravating or alleviating factors.  History obtained from patient.  No interpreter was used.     HPI  Past Medical History:  Diagnosis Date  . Allergy    SEASONAL  . 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 Anxiety   . Asthma   . Back pain with right-sided sciatica 06/18/2016  . Body mass index 36.0-36.9, adult 04/10/2016  . Body mass index 37.0-37.9, adult 03/13/2016  . Depression   . Dysmenorrhea 04/10/2016  . GERD (gastroesophageal reflux disease)   . Hemorrhoids 02/10/2015  . Hiatal hernia   . Hyperlipidemia 04/09/2011  . Hypertension   . Menorrhagia with  regular cycle 04/10/2016  . Muscle spasm of back 03/13/2016  . Obesity   . Ovarian cyst 12/18/2016  . Perianal wart 02/10/2015  . Stress 02/14/2016  . Weight loss counseling, encounter for 03/13/2016    Patient Active Problem List   Diagnosis Date Noted  . Ovarian cyst 12/18/2016  . 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 Numbness and tingling in left hand 08/19/2016  . Back pain with right-sided sciatica 06/18/2016  . Menorrhagia 04/10/2016  . Dysmenorrhea 04/10/2016  . Muscle spasm of back 03/13/2016  . Obesity 03/13/2016  . Weight loss counseling, encounter for 03/13/2016  . Stress 02/14/2016  . Bipolar disorder (HCC) 01/15/2016  . Hiatal hernia 01/15/2016  . Migraine 01/15/2016  . Tobacco use disorder 07/10/2013  . GERD (gastroesophageal reflux disease) 07/10/2013  . HTN (hypertension) 07/08/2013  . Asthma, mild persistent 04/05/2013  . Hyperlipidemia 04/09/2011  . Depressive disorder, not elsewhere classified 03/27/2007  . Allergic rhinitis 08/16/2003    Past Surgical History:  Procedure Laterality Date  . CESAREAN SECTION    . CHOLECYSTECTOMY    . COLONOSCOPY    . ESOPHAGOGASTRODUODENOSCOPY ENDOSCOPY    . TUBAL LIGATION       OB History    Gravida  2   Para  2   Term  2   Preterm      AB      Living  2     SAB      TAB      Ectopic      Multiple  Live Births  2            Home Medications    Prior to Admission medications   Medication Sig Start Date End Date Taking? Authorizing Provider  albuterol (PROVENTIL HFA;VENTOLIN HFA) 108 (90 Base) MCG/ACT inhaler Inhale 1-2 puffs into the lungs every 6 (six) hours as needed for wheezing or shortness of breath. 01/26/19   Kiyan Burmester A, PA-C  amoxicillin-clavulanate (AUGMENTIN) 875-125 MG tablet Take 1 tablet by mouth every 12 (twelve) hours for 7 days. 01/26/19 02/02/19  Jessicaann Overbaugh A, PA-C  azithromycin (ZITHROMAX) 250 MG tablet Take 1 tablet (250 mg total) by mouth daily.  01/14/19   Geoffery Lyonselo, Douglas, MD  cyclobenzaprine (FLEXERIL) 10 MG tablet Take 1 tablet (10 mg total) by mouth 2 (two) times daily as needed for muscle spasms. 09/17/18   Burgess AmorIdol, Julie, PA-C  ketorolac (TORADOL) 10 MG tablet Take 1 tablet (10 mg total) by mouth every 6 (six) hours as needed. 11/20/18   Calvert CantorWeinhold, Samantha C, CNM  metroNIDAZOLE (FLAGYL) 500 MG tablet Take 1 tablet (500 mg total) by mouth 2 (two) times daily. 11/20/18   Calvert CantorWeinhold, Samantha C, CNM  predniSONE (STERAPRED UNI-PAK 21 TAB) 10 MG (21) TBPK tablet Take 6 tabs by mouth daily  for 1 days, then 5 tabs for 1 days, then 4 tabs for 1 days, then 3 tabs for 1 days, 2 tabs for 1 days, then 1 tab by mouth daily for 1days 01/26/19   Adhvik Canady A, PA-C    Family History Family History  Problem Relation Age of Onset  . Diabetes Mother   . Kidney failure Mother   . Early death Mother [id number]       diabetes complications  . Cancer Father 860       colon cancer  . Depression Sister   . Hypertension Sister   . Anxiety disorder Sister   . Other Sister        back problems  . ADD / ADHD Daughter   . Other Daughter        behavioral issues  . ODD Daughter   . Asthma Son   . Cancer Maternal Grandmother        breast  . Seizures Maternal Grandmother   . Lupus Other     Social History Social History   Tobacco Use  . Smoking status: Current Every Day Smoker    Packs/day: 1.00    Years: 20.00    Pack years: 20.00    Types: Cigarettes  . Smokeless tobacco: Never Used  . Tobacco comment: trying to quit  Substance Use Topics  . Alcohol use: Yes    Comment: occ  . Drug use: No     Allergies   Latex and Lisinopril   Review of Systems Review of Systems  Constitutional:       Subjective fever  HENT: Positive for congestion, ear pain, rhinorrhea, sinus pressure and sinus pain. Negative for drooling, ear discharge, facial swelling, nosebleeds, postnasal drip, sneezing, sore throat, tinnitus, trouble swallowing and voice change.    Eyes: Negative.   Respiratory: Positive for cough. Negative for choking, chest tightness, shortness of breath, wheezing and stridor.   Cardiovascular: Negative.   Gastrointestinal: Negative.   Genitourinary: Negative.   Musculoskeletal: Negative.   Skin: Negative.   Neurological: Negative.   All other systems reviewed and are negative.    Physical Exam Updated Vital Signs BP (!) 142/84 (BP Location: Right Arm)   Pulse 85   Temp  97.9 F (36.6 C) (Oral)   Resp 18   Ht 5\' 1"  (1.549 m)   Wt 88 kg   SpO2 100%   BMI 36.66 kg/m   Physical Exam Vitals signs and nursing note reviewed.  Constitutional:      General: She is not in acute distress.    Appearance: She is well-developed. She is not ill-appearing, toxic-appearing or diaphoretic.  HENT:     Head: Normocephalic and atraumatic.     Jaw: There is normal jaw occlusion.     Right Ear: Tympanic membrane, ear canal and external ear normal.     Left Ear: Tympanic membrane, ear canal and external ear normal.     Ears:     Comments: Clear fluid behind bilateral TMs.  No erythema or bulging.  Canals clear.    Nose:     Right Sinus: Maxillary sinus tenderness present. No frontal sinus tenderness.     Left Sinus: Maxillary sinus tenderness present. No frontal sinus tenderness.     Comments: Purulent rhinorrhea and nasal congestion bilateral nares.  Maxillary facial tenderness.    Mouth/Throat:     Comments: Oropharynx clear.  Uvula midline without deviation.  No tonsillar edema or exudate.  No drooling, dysphasia, dysphonia. Eyes:     Pupils: Pupils are equal, round, and reactive to light.  Neck:     Musculoskeletal: Full passive range of motion without pain and normal range of motion.     Comments: No neck stiffness or neck rigidity. Cardiovascular:     Rate and Rhythm: Normal rate.     Pulses: Normal pulses.     Heart sounds: Normal heart sounds.  Pulmonary:     Effort: No respiratory distress.     Comments: Clear to  auscultation bilaterally without wheeze, rhonchi or rales Abdominal:     General: There is no distension.  Musculoskeletal: Normal range of motion.  Skin:    General: Skin is warm and dry.  Neurological:     Mental Status: She is alert.      ED Treatments / Results  Labs (all labs ordered are listed, but only abnormal results are displayed) Labs Reviewed - No data to display  EKG None  Radiology Dg Chest 2 View  Result Date: 01/26/2019 CLINICAL DATA:  COUGH AND FEVER X 1 WEEK AND HAD THE FLU THE WEEK BEFORE. SMOKES 1 PACK/DAY FOR 20+ YEARS, PNEUMONIA, BRONCHITIS, ASTHMA, BTL. EXAM: CHEST - 2 VIEW COMPARISON:  09/17/2018 FINDINGS: Heart size is normal. There is perihilar peribronchial thickening. There are no focal consolidations or pleural effusions. No pulmonary edema. Mild midthoracic spondylosis. IMPRESSION: 1. Bronchitic changes. 2. No focal acute pulmonary abnormality. Electronically Signed   By: Norva Pavlov M.D.   On: 01/26/2019 12:57    Procedures Procedures (including critical care time)  Medications Ordered in ED Medications - No data to display  Initial Impression / Assessment and Plan / ED Course  I have reviewed the triage vital signs and the nursing notes.  Pertinent labs & imaging results that were available during my care of the patient were reviewed by me and considered in my medical decision making (see chart for details).  38 year old female appears otherwise well presents for evaluation of nasal congestion and cough.  Afebrile, nonseptic, non-ill-appearing.  Purulent nasal discharge. Previously on azithromycin 2 weeks ago without resolve of symptoms.  Has developed non productive cough.  Lungs clear to auscultation bilateral without wheeze, rhonchi or rales.  No accessory muscle usage.  No tachypnea,  tachycardia or hypoxia.  Chest x-ray consistent with bronchitis.  No evidence of infiltrates. Given purulent nasal discharge greater than 14 days will DC home  with antibiotics, prednisone as well as provide inhaler for bronchitis.  No evidence of acute emergent pathology causing symptoms at this time.  Patient hemodynamically stable and appropriate for DC home this time.  Discussed return precautions.  Patient voices understanding of return precautions and is agreeable to follow-up. Instructions given for warm saline nasal wash and recommendations for follow-up with primary care physician.       Final Clinical Impressions(s) / ED Diagnoses   Final diagnoses:  Acute recurrent maxillary sinusitis  Viral URI with cough    ED Discharge Orders         Ordered    predniSONE (STERAPRED UNI-PAK 21 TAB) 10 MG (21) TBPK tablet     01/26/19 1338    amoxicillin-clavulanate (AUGMENTIN) 875-125 MG tablet  Every 12 hours     01/26/19 1338    albuterol (PROVENTIL HFA;VENTOLIN HFA) 108 (90 Base) MCG/ACT inhaler  Every 6 hours PRN     01/26/19 1338           Tad Fancher A, PA-C 01/26/19 1344    Donnetta Hutching, MD 01/26/19 1525

## 2019-01-26 NOTE — ED Triage Notes (Signed)
Pt had the flu last week with sinus issues. Now nose has been running. States she has fluid behind her ear as well. Low grade fevers as well. NAD.

## 2019-05-05 ENCOUNTER — Encounter (HOSPITAL_COMMUNITY): Payer: Self-pay | Admitting: Emergency Medicine

## 2019-05-05 ENCOUNTER — Other Ambulatory Visit: Payer: Self-pay

## 2019-05-05 ENCOUNTER — Emergency Department (HOSPITAL_COMMUNITY)
Admission: EM | Admit: 2019-05-05 | Discharge: 2019-05-05 | Disposition: A | Discharge status: Home / Self Care | Payer: Self-pay | Attending: Emergency Medicine | Admitting: Emergency Medicine

## 2019-05-05 DIAGNOSIS — L0231 Cutaneous abscess of buttock: Secondary | ICD-10-CM | POA: Insufficient documentation

## 2019-05-05 DIAGNOSIS — J45909 Unspecified asthma, uncomplicated: Secondary | ICD-10-CM | POA: Insufficient documentation

## 2019-05-05 DIAGNOSIS — F17210 Nicotine dependence, cigarettes, uncomplicated: Secondary | ICD-10-CM | POA: Insufficient documentation

## 2019-05-05 DIAGNOSIS — I10 Essential (primary) hypertension: Secondary | ICD-10-CM | POA: Insufficient documentation

## 2019-05-05 MED ORDER — IBUPROFEN 600 MG PO TABS: 600.0000 mg | ORAL_TABLET | Freq: Four times a day (QID) | ORAL | 0 refills | Status: DC | PRN

## 2019-05-05 MED ORDER — LIDOCAINE HCL (PF) 1 % IJ SOLN
5.0000 mL | Freq: Once | INTRAMUSCULAR | Status: AC
  Administered 2019-05-05: 5 mL
  Filled 2019-05-05: qty 6

## 2019-05-05 MED ORDER — SULFAMETHOXAZOLE-TRIMETHOPRIM 800-160 MG PO TABS: 1.0000 | ORAL_TABLET | Freq: Two times a day (BID) | ORAL | 0 refills | Status: AC

## 2019-05-05 MED ORDER — SULFAMETHOXAZOLE-TRIMETHOPRIM 800-160 MG PO TABS
1.0000 | ORAL_TABLET | Freq: Once | ORAL | Status: AC
  Administered 2019-05-05: 1 via ORAL
  Filled 2019-05-05: qty 1

## 2019-05-05 NOTE — Discharge Instructions (Addendum)
Take one more dose of the antibiotic tonight and complete the entire course.  A warm water soak for 15 minutes twice daily can be helpful as discussed.  Keep the site covered as it will probably continue bleeding for several days.  Get rechecked if you develop any worsening symptoms including worse swelling, pain, fevers.

## 2019-05-05 NOTE — ED Provider Notes (Signed)
[facility] EMERGENCY DEPARTMENT Provider Note   CSN: [id number] Arrival date & time: 05/05/19  1329    History   Chief Complaint Chief Complaint  Patient presents with  . Abscess    HPI Destiny Petty is a 38 y.o. female.     The history is provided by the patient.  Abscess  Location:  Pelvis Pelvic abscess location:  R buttock Abscess quality: fluctuance, painful and redness   Abscess quality: not draining (2 cm)   Red streaking: no   Duration:  2 weeks Progression:  Worsening Pain details:    Quality:  Sharp   Severity:  Moderate   Duration:  2 weeks   Timing:  Constant   Progression:  Worsening Chronicity:  Recurrent (had a pilonidal abscess drained last year superior to the current infection site) Context: not diabetes, not immunosuppression, not injected drug use, not insect bite/sting and not skin injury   Relieved by:  Nothing Worsened by:  Draining/squeezing Ineffective treatments:  Warm compresses Associated symptoms: no fatigue, no fever, no nausea and no vomiting   Risk factors: prior abscess   Risk factors: no hx of MRSA     Past Medical History:  Diagnosis Date  . Allergy    SEASONAL  . 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 Anxiety   . Asthma   . Back pain with right-sided sciatica 06/18/2016  . Body mass index 36.0-36.9, adult 04/10/2016  . Body mass index 37.0-37.9, adult 03/13/2016  . Depression   . Dysmenorrhea 04/10/2016  . GERD (gastroesophageal reflux disease)   . Hemorrhoids 02/10/2015  . Hiatal hernia   . Hyperlipidemia 04/09/2011  . Hypertension   . Menorrhagia with regular cycle 04/10/2016  . Muscle spasm of back 03/13/2016  . Obesity   . Ovarian cyst 12/18/2016  . Perianal wart 02/10/2015  . Stress 02/14/2016  . Weight loss counseling, encounter for 03/13/2016    Patient Active Problem List   Diagnosis Date Noted  . Ovarian cyst 12/18/2016  . 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 Numbness and tingling in left hand 08/19/2016   . Back pain with right-sided sciatica 06/18/2016  . Menorrhagia 04/10/2016  . Dysmenorrhea 04/10/2016  . Muscle spasm of back 03/13/2016  . Obesity 03/13/2016  . Weight loss counseling, encounter for 03/13/2016  . Stress 02/14/2016  . Bipolar disorder (HCC) 01/15/2016  . Hiatal hernia 01/15/2016  . Migraine 01/15/2016  . Tobacco use disorder 07/10/2013  . GERD (gastroesophageal reflux disease) 07/10/2013  . HTN (hypertension) 07/08/2013  . Asthma, mild persistent 04/05/2013  . Hyperlipidemia 04/09/2011  . Depressive disorder, not elsewhere classified 03/27/2007  . Allergic rhinitis 08/16/2003    Past Surgical History:  Procedure Laterality Date  . CESAREAN SECTION    . CHOLECYSTECTOMY    . COLONOSCOPY    . ESOPHAGOGASTRODUODENOSCOPY ENDOSCOPY    . TUBAL LIGATION       OB History    Gravida  2   Para  2   Term  2   Preterm      AB      Living  2     SAB      TAB      Ectopic      Multiple      Live Births  2            Home Medications    Prior to Admission medications   Medication Sig Start Date End Date Taking? Authorizing Provider  albuterol (PROVENTIL HFA;VENTOLIN  HFA) 108 (90 Base) MCG/ACT inhaler Inhale 1-2 puffs into the lungs every 6 (six) hours as needed for wheezing or shortness of breath. 01/26/19   Henderly, Britni A, PA-C  azithromycin (ZITHROMAX) 250 MG tablet Take 1 tablet (250 mg total) by mouth daily. 01/14/19   Geoffery Lyons, MD  cyclobenzaprine (FLEXERIL) 10 MG tablet Take 1 tablet (10 mg total) by mouth 2 (two) times daily as needed for muscle spasms. 09/17/18   [name], PA-C  ibuprofen (ADVIL) 600 MG tablet Take 1 tablet (600 mg total) by mouth every 6 (six) hours as needed. 05/05/19   [name], PA-C  ketorolac (TORADOL) 10 MG tablet Take 1 tablet (10 mg total) by mouth every 6 (six) hours as needed. 11/20/18   Calvert Cantor, CNM  metroNIDAZOLE (FLAGYL) 500 MG tablet Take 1 tablet (500 mg total) by mouth 2 (two) times  daily. 11/20/18   Calvert Cantor, CNM  predniSONE (STERAPRED UNI-PAK 21 TAB) 10 MG (21) TBPK tablet Take 6 tabs by mouth daily  for 1 days, then 5 tabs for 1 days, then 4 tabs for 1 days, then 3 tabs for 1 days, 2 tabs for 1 days, then 1 tab by mouth daily for 1days 01/26/19   Henderly, Britni A, PA-C  sulfamethoxazole-trimethoprim (BACTRIM DS) 800-160 MG tablet Take 1 tablet by mouth 2 (two) times daily for 10 days. 05/05/19 05/15/19  [name], PA-C    Family History Family History  Problem Relation Age of Onset  . Diabetes Mother   . Kidney failure Mother   . Early death Mother 7       diabetes complications  . Cancer Father 81       colon cancer  . Depression Sister   . Hypertension Sister   . Anxiety disorder Sister   . Other Sister        back problems  . ADD / ADHD Daughter   . Other Daughter        behavioral issues  . ODD Daughter   . Asthma Son   . Cancer Maternal Grandmother        breast  . Seizures Maternal Grandmother   . Lupus Other     Social History Social History   Tobacco Use  . Smoking status: Current Every Day Smoker    Packs/day: 1.00    Years: 20.00    Pack years: 20.00    Types: Cigarettes  . Smokeless tobacco: Never Used  . Tobacco comment: trying to quit  Substance Use Topics  . Alcohol use: Yes    Comment: occ  . Drug use: No     Allergies   Latex and Lisinopril   Review of Systems Review of Systems  Constitutional: Negative for chills, fatigue and fever.  Respiratory: Negative.   Gastrointestinal: Negative for nausea and vomiting.  Skin: Positive for color change.       Negative except as mentioned in HPI.   Neurological: Negative for numbness.     Physical Exam Updated Vital Signs BP 129/78   Pulse 80   Temp 97.9 F (36.6 C) (Oral)   Resp 16   Ht 5' (1.524 m)   Wt 90.3 kg   LMP 05/03/2019   SpO2 100%   BMI 38.86 kg/m   Physical Exam Constitutional:      General: She is not in acute distress.     Appearance: She is well-developed.  HENT:     Head: Normocephalic.  Neck:  Musculoskeletal: Neck supple.  Cardiovascular:     Rate and Rhythm: Normal rate.  Pulmonary:     Effort: Pulmonary effort is normal.     Breath sounds: No wheezing.  Musculoskeletal: Normal range of motion.  Skin:    Comments: 2 cm, raised, erythematous, indurated abscess right buttock near the midline, inferior to the previous I&D scarring noted from pilonidal abscess. No perirectal abscess.       ED Treatments / Results  Labs (all labs ordered are listed, but only abnormal results are displayed) Labs Reviewed  AEROBIC CULTURE (SUPERFICIAL SPECIMEN)    EKG None  Radiology No results found.  Procedures Procedures (including critical care time)  INCISION AND DRAINAGE Performed by: [name] Consent: Verbal consent obtained. Risks and benefits: risks, benefits and alternatives were discussed Type: abscess  Body area: buttock  Anesthesia: local infiltration  Incision was made with a scalpel.  Local anesthetic: lidocaine 1% without epinephrine  Anesthetic total: 3 ml  Complexity: complex Blunt dissection to break up loculations  Drainage: purulent  Drainage amount: small  Packing material: none  Patient tolerance: Patient tolerated the procedure well with no immediate complications.     Medications Ordered in ED Medications  lidocaine (PF) (XYLOCAINE) 1 % injection 5 mL (5 mLs Other Given by Other 05/05/19 1433)  sulfamethoxazole-trimethoprim (BACTRIM DS) 800-160 MG per tablet 1 tablet (1 tablet Oral Given 05/05/19 1454)     Initial Impression / Assessment and Plan / ED Course  I have reviewed the triage vital signs and the nursing notes.  Pertinent labs & imaging results that were available during my care of the patient were reviewed by me and considered in my medical decision making (see chart for details).        Home care instructions given, including return  precautions, bactrim, ibuprofen. Prn f/u anticipated.   Final Clinical Impressions(s) / ED Diagnoses   Final diagnoses:  Abscess of buttock, right    ED Discharge Orders         Ordered    sulfamethoxazole-trimethoprim (BACTRIM DS) 800-160 MG tablet  2 times daily     05/05/19 1436    ibuprofen (ADVIL) 600 MG tablet  Every 6 hours PRN     05/05/19 1436           [name], PA-C 05/05/19 1523    Jacalyn Lefevre, MD 05/06/19 1231

## 2019-05-05 NOTE — ED Triage Notes (Signed)
Pt states that she has a abscess on her right buttock

## 2019-05-08 LAB — AEROBIC CULTURE W GRAM STAIN (SUPERFICIAL SPECIMEN): Special Requests: NORMAL

## 2019-05-08 LAB — AEROBIC CULTURE? (SUPERFICIAL SPECIMEN)

## 2019-05-09 ENCOUNTER — Telehealth: Payer: Self-pay | Admitting: Emergency Medicine

## 2019-05-09 NOTE — Telephone Encounter (Signed)
Post ED Visit - Positive Culture Follow-up  Culture report reviewed by antimicrobial stewardship pharmacist: [facility] []  Enzo Bi, Pharm.D. []  Celedonio Miyamoto, Pharm.D., BCPS AQ-ID []  Garvin Fila, Pharm.D., BCPS []  Georgina Pillion, Pharm.D., BCPS []  [place], [address].D., BCPS, AAHIVP []  Estella Husk, Pharm.D., BCPS, AAHIVP [x]  Lysle Pearl, PharmD, BCPS []  Phillips Climes, PharmD, BCPS []  Agapito Games, PharmD, BCPS []  Verlan Friends, PharmD []  Mervyn Gay, PharmD, BCPS []  Vinnie Level, PharmD  [facility] []  Len Childs, PharmD []  Greer Pickerel, PharmD []  Adalberto Cole, PharmD []  Perlie Gold, Rph []  Lonell Face) Jean Rosenthal, PharmD []  Earl Many, PharmD []  Junita Push, PharmD []  Dorna Leitz, PharmD []  Terrilee Files, PharmD []  Lynann Beaver, PharmD []  Keturah Barre, PharmD []  Loralee Pacas, PharmD []  Bernadene Person, PharmD   Positive Aerobic culture Treated with Sulfamethoxazole-Trimethoprim, organism sensitive to the same and no further patient follow-up is required at this time.  Carollee Herter Kennet Mccort 05/09/2019, 4:27 PM

## 2019-06-06 ENCOUNTER — Emergency Department (HOSPITAL_COMMUNITY)
Admission: EM | Admit: 2019-06-06 | Discharge: 2019-06-06 | Disposition: A | Discharge status: Home / Self Care | Payer: Self-pay | Attending: Emergency Medicine | Admitting: Emergency Medicine

## 2019-06-06 ENCOUNTER — Encounter (HOSPITAL_COMMUNITY): Payer: Self-pay

## 2019-06-06 ENCOUNTER — Other Ambulatory Visit: Payer: Self-pay

## 2019-06-06 DIAGNOSIS — I10 Essential (primary) hypertension: Secondary | ICD-10-CM | POA: Insufficient documentation

## 2019-06-06 DIAGNOSIS — F17210 Nicotine dependence, cigarettes, uncomplicated: Secondary | ICD-10-CM | POA: Insufficient documentation

## 2019-06-06 DIAGNOSIS — N939 Abnormal uterine and vaginal bleeding, unspecified: Secondary | ICD-10-CM

## 2019-06-06 DIAGNOSIS — N938 Other specified abnormal uterine and vaginal bleeding: Secondary | ICD-10-CM | POA: Insufficient documentation

## 2019-06-06 LAB — CBC WITH DIFFERENTIAL/PLATELET
Abs Immature Granulocytes: 0.03 10*3/uL (ref 0.00–0.07)
Basophils Absolute: 0.1 10*3/uL (ref 0.0–0.1)
Basophils Relative: 1 %
Eosinophils Absolute: 0.5 10*3/uL (ref 0.0–0.5)
Eosinophils Relative: 5 %
HCT: 43.2 % (ref 36.0–46.0)
Hemoglobin: 14.3 g/dL (ref 12.0–15.0)
Immature Granulocytes: 0 %
Lymphocytes Relative: 29 %
Lymphs Abs: 2.6 10*3/uL (ref 0.7–4.0)
MCH: 32.9 pg (ref 26.0–34.0)
MCHC: 33.1 g/dL (ref 30.0–36.0)
MCV: 99.3 fL (ref 80.0–100.0)
Monocytes Absolute: 0.7 10*3/uL (ref 0.1–1.0)
Monocytes Relative: 7 %
Neutro Abs: 5.4 10*3/uL (ref 1.7–7.7)
Neutrophils Relative %: 58 %
Platelets: 237 10*3/uL (ref 150–400)
RBC: 4.35 MIL/uL (ref 3.87–5.11)
RDW: 12.7 % (ref 11.5–15.5)
WBC: 9.2 10*3/uL (ref 4.0–10.5)
nRBC: 0 % (ref 0.0–0.2)

## 2019-06-06 LAB — I-STAT BETA HCG BLOOD, ED (MC, WL, AP ONLY): I-stat hCG, quantitative: <5 m[IU]/mL (ref <5)

## 2019-06-06 LAB — URINALYSIS, ROUTINE W REFLEX MICROSCOPIC
Bacteria, UA: NONE SEEN
Bilirubin Urine: NEGATIVE
Glucose, UA: NEGATIVE mg/dL
Ketones, ur: NEGATIVE mg/dL
Leukocytes,Ua: NEGATIVE
Nitrite: NEGATIVE
Protein, ur: NEGATIVE mg/dL
Specific Gravity, Urine: 1.019 (ref 1.005–1.030)
pH: 5 (ref 5.0–8.0)

## 2019-06-06 LAB — BASIC METABOLIC PANEL
Anion gap: 8 (ref 5–15)
BUN: 9 mg/dL (ref 6–20)
CO2: 25 mmol/L (ref 22–32)
Calcium: 8.9 mg/dL (ref 8.9–10.3)
Chloride: 106 mmol/L (ref 98–111)
Creatinine, Ser: 0.85 mg/dL (ref 0.44–1.00)
GFR calc Af Amer: >60 mL/min (ref >60)
GFR calc non Af Amer: >60 mL/min (ref >60)
Glucose, Bld: 90 mg/dL (ref 70–99)
Potassium: 3.9 mmol/L (ref 3.5–5.1)
Sodium: 139 mmol/L (ref 135–145)

## 2019-06-06 LAB — WET PREP, GENITAL
Clue Cells Wet Prep HPF POC: NONE SEEN
Sperm: NONE SEEN
Trich, Wet Prep: NONE SEEN
Yeast Wet Prep HPF POC: NONE SEEN

## 2019-06-06 LAB — TYPE AND SCREEN
ABO/RH(D): O NEG
Antibody Screen: NEGATIVE

## 2019-06-06 MED ORDER — MEGESTROL ACETATE 40 MG PO TABS: 40.0000 mg | ORAL_TABLET | Freq: Three times a day (TID) | ORAL | 0 refills | Status: DC

## 2019-06-06 NOTE — Discharge Instructions (Addendum)
Take Megace 3 times daily until the bleeding stops and then switch to once a day until your visit with Dr. Glo Herring.  Please call Dr. Johnnye Sima office tomorrow to make an appointment for further evaluation and treatment of your vaginal bleeding.  Please return to emergency department if you develop any new or worsening symptoms including severe chest pain, shortness of breath, passing out, or any other new or concerning symptoms.f

## 2019-06-06 NOTE — ED Provider Notes (Signed)
[facility] EMERGENCY DEPARTMENT Provider Note   CSN: [id number] Arrival date & time: 06/06/19  1318    History   Chief Complaint Chief Complaint  Patient presents with  . Vaginal Bleeding    HPI Destiny Petty is a 38 y.o. female with history of hypertension, GERD, anxiety, depression, dysmenorrhea who presents with vaginal bleeding for the past 2 to 3 days.  Patient states this is her second menstrual period this month.  She reports always having heavy bleeding, but she has never had 2 periods in a month.  She reports it is also a lot heavier than normal and she is passing clots.  She states she is feeling a super tampon and pad every 30 minutes.  She is starting to feel weak and lightheaded.  She has had associated lower abdominal cramping and lower back cramping.  She reports a couple years ago, she had an ablation scheduled, however she lost her insurance and could not get it.  She denies any fevers, chest pain, shortness of breath, vomiting.  She has had some intermittent nausea with it.  She has had a tubal ligation.  She would like to be checked for STDs, but is only sexually active with one partner.     HPI  Past Medical History:  Diagnosis Date  . Allergy    SEASONAL  . 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 Anxiety   . Asthma   . Back pain with right-sided sciatica 06/18/2016  . Body mass index 36.0-36.9, adult 04/10/2016  . Body mass index 37.0-37.9, adult 03/13/2016  . Depression   . Dysmenorrhea 04/10/2016  . GERD (gastroesophageal reflux disease)   . Hemorrhoids 02/10/2015  . Hiatal hernia   . Hyperlipidemia 04/09/2011  . Hypertension   . Menorrhagia with regular cycle 04/10/2016  . Muscle spasm of back 03/13/2016  . Obesity   . Ovarian cyst 12/18/2016  . Perianal wart 02/10/2015  . Stress 02/14/2016  . Weight loss counseling, encounter for 03/13/2016    Patient Active Problem List   Diagnosis Date Noted  . Ovarian cyst 12/18/2016  . Varicose veins of left lower extremity  08/19/2016  . Numbness and tingling in left hand 08/19/2016  . Back pain with right-sided sciatica 06/18/2016  . Menorrhagia 04/10/2016  . Dysmenorrhea 04/10/2016  . Muscle spasm of back 03/13/2016  . Obesity 03/13/2016  . Weight loss counseling, encounter for 03/13/2016  . Stress 02/14/2016  . Bipolar disorder (HCC) 01/15/2016  . Hiatal hernia 01/15/2016  . Migraine 01/15/2016  . Tobacco use disorder 07/10/2013  . GERD (gastroesophageal reflux disease) 07/10/2013  . HTN (hypertension) 07/08/2013  . Asthma, mild persistent 04/05/2013  . Hyperlipidemia 04/09/2011  . Depressive disorder, not elsewhere classified 03/27/2007  . Allergic rhinitis 08/16/2003    Past Surgical History:  Procedure Laterality Date  . CESAREAN SECTION    . CHOLECYSTECTOMY    . COLONOSCOPY    . ESOPHAGOGASTRODUODENOSCOPY ENDOSCOPY    . TUBAL LIGATION       OB History    Gravida  2   Para  2   Term  2   Preterm      AB      Living  2     SAB      TAB      Ectopic      Multiple      Live Births  2            Home Medications    Prior to Admission  medications   Medication Sig Start Date End Date Taking? Authorizing Provider  albuterol (PROVENTIL HFA;VENTOLIN HFA) 108 (90 Base) MCG/ACT inhaler Inhale 1-2 puffs into the lungs every 6 (six) hours as needed for wheezing or shortness of breath. 01/26/19  Yes Henderly, Britni A, PA-C  azithromycin (ZITHROMAX) 250 MG tablet Take 1 tablet (250 mg total) by mouth daily. 01/14/19   Geoffery Lyonselo, Douglas, MD  cyclobenzaprine (FLEXERIL) 10 MG tablet Take 1 tablet (10 mg total) by mouth 2 (two) times daily as needed for muscle spasms. 09/17/18   Burgess AmorIdol, Julie, PA-C  ibuprofen (ADVIL) 600 MG tablet Take 1 tablet (600 mg total) by mouth every 6 (six) hours as needed. 05/05/19   Burgess AmorIdol, Julie, PA-C  ketorolac (TORADOL) 10 MG tablet Take 1 tablet (10 mg total) by mouth every 6 (six) hours as needed. 11/20/18   Calvert CantorWeinhold, Samantha C, CNM  megestrol (MEGACE) 40 MG  tablet Take 1 tablet (40 mg total) by mouth 3 (three) times daily. Switch to once daily when bleeding stops. 06/06/19   Hoby Kawai, Waylan BogaAlexandra M, PA-C  metroNIDAZOLE (FLAGYL) 500 MG tablet Take 1 tablet (500 mg total) by mouth 2 (two) times daily. 11/20/18   Calvert CantorWeinhold, Samantha C, CNM  predniSONE (STERAPRED UNI-PAK 21 TAB) 10 MG (21) TBPK tablet Take 6 tabs by mouth daily  for 1 days, then 5 tabs for 1 days, then 4 tabs for 1 days, then 3 tabs for 1 days, 2 tabs for 1 days, then 1 tab by mouth daily for 1days 01/26/19   Henderly, Britni A, PA-C    Family History Family History  Problem Relation Age of Onset  . Diabetes Mother   . Kidney failure Mother   . Early death Mother 1949       diabetes complications  . Cancer Father 4560       colon cancer  . Depression Sister   . Hypertension Sister   . Anxiety disorder Sister   . Other Sister        back problems  . ADD / ADHD Daughter   . Other Daughter        behavioral issues  . ODD Daughter   . Asthma Son   . Cancer Maternal Grandmother        breast  . Seizures Maternal Grandmother   . Lupus Other     Social History Social History   Tobacco Use  . Smoking status: Current Every Day Smoker    Packs/day: 1.00    Years: 20.00    Pack years: 20.00    Types: Cigarettes  . Smokeless tobacco: Never Used  . Tobacco comment: trying to quit  Substance Use Topics  . Alcohol use: Yes    Comment: occ  . Drug use: No     Allergies   Latex and Lisinopril   Review of Systems Review of Systems  Constitutional: Positive for fatigue. Negative for chills and fever.  HENT: Negative for facial swelling and sore throat.   Respiratory: Negative for shortness of breath.   Cardiovascular: Negative for chest pain.  Gastrointestinal: Positive for abdominal pain and nausea. Negative for diarrhea and vomiting.  Genitourinary: Positive for menstrual problem, pelvic pain and vaginal bleeding. Negative for dysuria and vaginal discharge.  Musculoskeletal:  Positive for back pain.  Skin: Negative for rash and wound.  Neurological: Positive for weakness and light-headedness. Negative for headaches.  Psychiatric/Behavioral: The patient is not nervous/anxious.      Physical Exam Updated Vital Signs BP (!) 153/110  Pulse 97   Temp 98 F (36.7 C) (Oral)   Resp 18   Ht 5\' 1"  (1.549 m)   Wt 86.2 kg   LMP 06/03/2019   SpO2 96%   BMI 35.90 kg/m   Physical Exam Vitals signs and nursing note reviewed. Exam conducted with a chaperone present.  Constitutional:      General: She is not in acute distress.    Appearance: She is well-developed. She is obese. She is not diaphoretic.  HENT:     Head: Normocephalic and atraumatic.     Mouth/Throat:     Pharynx: No oropharyngeal exudate.  Eyes:     General: No scleral icterus.       Right eye: No discharge.        Left eye: No discharge.     Conjunctiva/sclera: Conjunctivae normal.     Pupils: Pupils are equal, round, and reactive to light.  Neck:     Musculoskeletal: Normal range of motion and neck supple.     Thyroid: No thyromegaly.  Cardiovascular:     Rate and Rhythm: Normal rate and regular rhythm.     Heart sounds: Normal heart sounds. No murmur. No friction rub. No gallop.   Pulmonary:     Effort: Pulmonary effort is normal. No respiratory distress.     Breath sounds: Normal breath sounds. No stridor. No wheezing or rales.  Abdominal:     General: Bowel sounds are normal. There is no distension.     Palpations: Abdomen is soft.     Tenderness: There is abdominal tenderness in the right lower quadrant, suprapubic area and left lower quadrant. There is no right CVA tenderness, left CVA tenderness, guarding or rebound.  Genitourinary:    Vagina: Bleeding present.     Cervix: Cervical bleeding (No hemorrhage, thin stringy clots) present.     Uterus: Tender.      Adnexa:        Right: Tenderness present.        Left: Tenderness present.      Comments: Tender throughout, however  patient is having active cramping Musculoskeletal:        General: Tenderness (lumbar across) present.  Lymphadenopathy:     Cervical: No cervical adenopathy.  Skin:    General: Skin is warm and dry.     Coloration: Skin is not pale.     Findings: No rash.  Neurological:     Mental Status: She is alert.     Coordination: Coordination normal.      ED Treatments / Results  Labs (all labs ordered are listed, but only abnormal results are displayed) Labs Reviewed  WET PREP, GENITAL - Abnormal; Notable for the following components:      Result Value   WBC, Wet Prep HPF POC MODERATE (*)    All other components within normal limits  URINALYSIS, ROUTINE W REFLEX MICROSCOPIC - Abnormal; Notable for the following components:   Hgb urine dipstick SMALL (*)    All other components within normal limits  BASIC METABOLIC PANEL  CBC WITH DIFFERENTIAL/PLATELET  RPR  HIV ANTIBODY (ROUTINE TESTING W REFLEX)  I-STAT BETA HCG BLOOD, ED ([facility], [facility], [facility] ONLY)  TYPE AND SCREEN  GC/CHLAMYDIA PROBE AMP ([place]) NOT AT [facility]    EKG None  Radiology No results found.  Procedures Procedures (including critical care time)  Medications Ordered in ED Medications - No data to display   Initial Impression / Assessment and Plan / ED Course  I have  reviewed the triage vital signs and the nursing notes.  Pertinent labs & imaging results that were available during my care of the patient were reviewed by me and considered in my medical decision making (see chart for details).        Patient presenting with a 2-day history of heavy vaginal bleeding.  Patient has history of dysmenorrhea and this is her second menstrual period this month.  She does not normally get to 2 per month and does not have this much bleeding and clots.  She states she is feeling a tampon and pad every 30 minutes, however her hemoglobin is 14.3, she is not hypotensive or tachycardic.  She is well-appearing.  BMP unremarkable.   hCG negative.  UA is negative for infection.  GC/chlamydia, HIV, RPR sent and pending.  No indication for emergent imaging today.  I discussed patient case with Dr. Glo Herring, OB/GYN on-call, who advised discharging the patient home with Megace and following up in the office for further evaluation.  Return precautions discussed.  Patient understands and agrees with plan.  Patient vital stable throughout ED course and discharged in satisfactory condition.  Final Clinical Impressions(s) / ED Diagnoses   Final diagnoses:  Vaginal bleeding    ED Discharge Orders         Ordered    megestrol (MEGACE) 40 MG tablet  3 times daily     06/06/19 1602           Frederica Kuster, PA-C 06/06/19 1638    Hayden Rasmussen, MD 06/07/19 [phone]

## 2019-06-06 NOTE — ED Triage Notes (Signed)
Pt presents to ED with complaints of heavy vaginal bleeding x 2-3 days, passing clots.

## 2019-06-07 LAB — HIV ANTIBODY (ROUTINE TESTING W REFLEX): HIV Screen 4th Generation wRfx: NONREACTIVE

## 2019-06-08 LAB — GC/CHLAMYDIA PROBE AMP (~~LOC~~) NOT AT ARMC
Chlamydia: NEGATIVE
Neisseria Gonorrhea: NEGATIVE

## 2019-06-09 LAB — RPR: RPR Ser Ql: NONREACTIVE

## 2019-06-13 ENCOUNTER — Other Ambulatory Visit: Payer: Self-pay

## 2019-06-13 DIAGNOSIS — J45909 Unspecified asthma, uncomplicated: Secondary | ICD-10-CM | POA: Insufficient documentation

## 2019-06-13 DIAGNOSIS — N83202 Unspecified ovarian cyst, left side: Secondary | ICD-10-CM | POA: Insufficient documentation

## 2019-06-13 DIAGNOSIS — F17210 Nicotine dependence, cigarettes, uncomplicated: Secondary | ICD-10-CM | POA: Insufficient documentation

## 2019-06-13 DIAGNOSIS — R102 Pelvic and perineal pain: Secondary | ICD-10-CM | POA: Insufficient documentation

## 2019-06-13 DIAGNOSIS — I10 Essential (primary) hypertension: Secondary | ICD-10-CM | POA: Insufficient documentation

## 2019-06-13 DIAGNOSIS — Z91040 Latex allergy status: Secondary | ICD-10-CM | POA: Insufficient documentation

## 2019-06-13 DIAGNOSIS — M545 Low back pain: Secondary | ICD-10-CM | POA: Insufficient documentation

## 2019-06-14 ENCOUNTER — Encounter (HOSPITAL_COMMUNITY): Payer: Self-pay | Admitting: Emergency Medicine

## 2019-06-14 ENCOUNTER — Other Ambulatory Visit: Payer: Self-pay

## 2019-06-14 ENCOUNTER — Emergency Department (HOSPITAL_COMMUNITY): Payer: Self-pay

## 2019-06-14 ENCOUNTER — Emergency Department (HOSPITAL_COMMUNITY)
Admission: EM | Admit: 2019-06-14 | Discharge: 2019-06-14 | Disposition: A | Discharge status: Home / Self Care | Payer: Self-pay | Attending: Emergency Medicine | Admitting: Emergency Medicine

## 2019-06-14 ENCOUNTER — Telehealth: Payer: Self-pay | Admitting: Obstetrics and Gynecology

## 2019-06-14 DIAGNOSIS — N83202 Unspecified ovarian cyst, left side: Secondary | ICD-10-CM

## 2019-06-14 DIAGNOSIS — R102 Pelvic and perineal pain: Secondary | ICD-10-CM

## 2019-06-14 LAB — CBC
HCT: 44.2 % (ref 36.0–46.0)
Hemoglobin: 14.5 g/dL (ref 12.0–15.0)
MCH: 32.6 pg (ref 26.0–34.0)
MCHC: 32.8 g/dL (ref 30.0–36.0)
MCV: 99.3 fL (ref 80.0–100.0)
Platelets: 258 10*3/uL (ref 150–400)
RBC: 4.45 MIL/uL (ref 3.87–5.11)
RDW: 13.1 % (ref 11.5–15.5)
WBC: 11.2 10*3/uL — ABNORMAL HIGH (ref 4.0–10.5)
nRBC: 0 % (ref 0.0–0.2)

## 2019-06-14 LAB — COMPREHENSIVE METABOLIC PANEL
ALT: 12 U/L (ref 0–44)
AST: 17 U/L (ref 15–41)
Albumin: 4.4 g/dL (ref 3.5–5.0)
Alkaline Phosphatase: 88 U/L (ref 38–126)
Anion gap: 7 (ref 5–15)
BUN: 16 mg/dL (ref 6–20)
CO2: 21 mmol/L — ABNORMAL LOW (ref 22–32)
Calcium: 9.1 mg/dL (ref 8.9–10.3)
Chloride: 109 mmol/L (ref 98–111)
Creatinine, Ser: 0.8 mg/dL (ref 0.44–1.00)
GFR calc Af Amer: >60 mL/min (ref >60)
GFR calc non Af Amer: >60 mL/min (ref >60)
Glucose, Bld: 98 mg/dL (ref 70–99)
Potassium: 4.5 mmol/L (ref 3.5–5.1)
Sodium: 137 mmol/L (ref 135–145)
Total Bilirubin: 0.3 mg/dL (ref 0.3–1.2)
Total Protein: 7.7 g/dL (ref 6.5–8.1)

## 2019-06-14 LAB — URINALYSIS, ROUTINE W REFLEX MICROSCOPIC
Bilirubin Urine: NEGATIVE
Glucose, UA: NEGATIVE mg/dL
Hgb urine dipstick: NEGATIVE
Ketones, ur: NEGATIVE mg/dL
Leukocytes,Ua: NEGATIVE
Nitrite: NEGATIVE
Protein, ur: NEGATIVE mg/dL
Specific Gravity, Urine: 1.012 (ref 1.005–1.030)
pH: 5 (ref 5.0–8.0)

## 2019-06-14 LAB — I-STAT BETA HCG BLOOD, ED (MC, WL, AP ONLY): I-stat hCG, quantitative: <5 m[IU]/mL (ref <5)

## 2019-06-14 LAB — LIPASE, BLOOD: Lipase: 39 U/L (ref 11–51)

## 2019-06-14 MED ORDER — SODIUM CHLORIDE 0.9% FLUSH: 3.0000 mL | Freq: Once | INTRAVENOUS | Status: DC

## 2019-06-14 MED ORDER — OXYCODONE HCL 5 MG PO TABS
10.0000 mg | ORAL_TABLET | Freq: Once | ORAL | Status: AC
  Administered 2019-06-14: 10 mg via ORAL
  Filled 2019-06-14: qty 2

## 2019-06-14 NOTE — ED Provider Notes (Signed)
[facility] DEPT Provider Note   CSN: [id number] Arrival date & time: 06/13/19  2356    History   Chief Complaint Chief Complaint  Patient presents with   Back Pain   Abdominal Pain    HPI Destiny Petty is a 38 y.o. female with a hx of hypertension, GERD, anxiety, depression, dysmenorrhea presents to the Emergency Department complaining of gradual, persistent, progressively worsening pelvic pain onset 3 days ago.  She reports associated low back pain. Pt reports the pain is severe and is not improved with Tylenol.  She denies fever, chills, chest pain, N/V/D, weakness, dizziness, syncope, dysuria, vaginal discharge.  Pt reports she was seen last week for vaginal bleeding. She reports this has stopped, but then the pain occurred.  Pt with a hx of c-section, but no other abdominal surgeries.  No specific aggravating or alleviating factors. She is sexually active with 1 partner.   Records show pt was evaluated on 06/06/19 for vaginal bleeding, lower abdominal pain and low back pain.  She was discharged with Megace.  She reports completing this, but has not called OB/GYN for follow-up.         The history is provided by the patient and medical records. No language interpreter was used.    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   Anxiety    Asthma    Back pain with right-sided sciatica 06/18/2016   Body mass index 36.0-36.9, adult 04/10/2016   Body mass index 37.0-37.9, adult 03/13/2016   Depression    Dysmenorrhea 04/10/2016   GERD (gastroesophageal reflux disease)    Hemorrhoids 02/10/2015   Hiatal hernia    Hyperlipidemia 04/09/2011   Hypertension    Menorrhagia with regular cycle 04/10/2016   Muscle spasm of back 03/13/2016   Obesity    Ovarian cyst 12/18/2016   Perianal wart 02/10/2015   Stress 02/14/2016   Weight loss counseling, encounter for 03/13/2016    Patient Active  Problem List   Diagnosis Date Noted   Ovarian cyst 12/18/2016   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   Numbness and tingling in left hand 08/19/2016   Back pain with right-sided sciatica 06/18/2016   Menorrhagia 04/10/2016   Dysmenorrhea 04/10/2016   Muscle spasm of back 03/13/2016   Obesity 03/13/2016   Weight loss counseling, encounter for 03/13/2016   Stress 02/14/2016   Bipolar disorder ([place]) 01/15/2016   Hiatal hernia 01/15/2016   Migraine 01/15/2016   Tobacco use disorder 07/10/2013   GERD (gastroesophageal reflux disease) 07/10/2013   HTN (hypertension) 07/08/2013   Asthma, mild persistent 04/05/2013   Hyperlipidemia 04/09/2011   Depressive disorder, not elsewhere classified 03/27/2007   Allergic rhinitis 08/16/2003    Past Surgical History:  Procedure Laterality Date   CESAREAN SECTION     CHOLECYSTECTOMY     COLONOSCOPY     ESOPHAGOGASTRODUODENOSCOPY ENDOSCOPY     TUBAL LIGATION       OB History    Gravida  2   Para  2   Term  2   Preterm      AB      Living  2     SAB      TAB      Ectopic      Multiple      Live Births  2            Home Medications  Prior to Admission medications   Medication Sig Start Date End Date Taking? Authorizing Provider  acetaminophen (TYLENOL) 500 MG tablet Take 1,000 mg by mouth every 6 (six) hours as needed for mild pain, moderate pain or headache.   Yes [provider]  albuterol (PROVENTIL HFA;VENTOLIN HFA) 108 (90 Base) MCG/ACT inhaler Inhale 1-2 puffs into the lungs every 6 (six) hours as needed for wheezing or shortness of breath. 01/26/19  Yes Henderly, Britni A, PA-C  ibuprofen (ADVIL) 200 MG tablet Take 400-600 mg by mouth every 6 (six) hours as needed for headache, mild pain, moderate pain or cramping.   Yes [provider]  azithromycin (ZITHROMAX) 250 MG tablet Take 1 tablet (250 mg total) by mouth daily. Patient not taking: Reported on  06/14/2019 01/14/19   Geoffery Lyons, MD  ibuprofen (ADVIL) 600 MG tablet Take 1 tablet (600 mg total) by mouth every 6 (six) hours as needed. Patient not taking: Reported on 06/14/2019 05/05/19   [name], PA-C  megestrol (MEGACE) 40 MG tablet Take 1 tablet (40 mg total) by mouth 3 (three) times daily. Switch to once daily when bleeding stops. Patient not taking: Reported on 06/14/2019 06/06/19   Emi Holes, PA-C    Family History Family History  Problem Relation Age of Onset   Diabetes Mother    Kidney failure Mother    Early death Mother 7       diabetes complications   Cancer Father 75       colon cancer   Depression Sister    Hypertension Sister    Anxiety disorder Sister    Other Sister        back problems   ADD / ADHD Daughter    Other Daughter        behavioral issues   ODD Daughter    Asthma Son    Cancer Maternal Grandmother        breast   Seizures Maternal Grandmother    Lupus Other     Social History Social History   Tobacco Use   Smoking status: Current Every Day Smoker    Packs/day: 1.00    Years: 20.00    Pack years: 20.00    Types: Cigarettes   Smokeless tobacco: Never Used   Tobacco comment: trying to quit  Substance Use Topics   Alcohol use: Yes    Comment: occ   Drug use: No     Allergies   Latex and Lisinopril   Review of Systems Review of Systems  Constitutional: Negative for appetite change, diaphoresis, fatigue, fever and unexpected weight change.  HENT: Negative for mouth sores.   Eyes: Negative for visual disturbance.  Respiratory: Negative for cough, chest tightness, shortness of breath and wheezing.   Cardiovascular: Negative for chest pain.  Gastrointestinal: Positive for abdominal pain. Negative for constipation, diarrhea, nausea and vomiting.  Endocrine: Negative for polydipsia, polyphagia and polyuria.  Genitourinary: Negative for dysuria, frequency, hematuria and urgency.  Musculoskeletal: Positive  for back pain. Negative for neck stiffness.  Skin: Negative for rash.  Allergic/Immunologic: Negative for immunocompromised state.  Neurological: Negative for syncope, light-headedness and headaches.  Hematological: Does not bruise/bleed easily.  Psychiatric/Behavioral: Negative for sleep disturbance. The patient is not nervous/anxious.      Physical Exam Updated Vital Signs BP 125/82 (BP Location: Left Arm)    Pulse 87    Temp 98.2 F (36.8 C) (Oral)    Resp 16    Ht  (1.549 m)  Wt 86.2 kg    LMP 06/03/2019    SpO2 99%    BMI 35.90 kg/m   Physical Exam Vitals signs and nursing note reviewed.  Constitutional:      General: She is not in acute distress.    Appearance: She is not diaphoretic.  HENT:     Head: Normocephalic.  Eyes:     General: No scleral icterus.    Conjunctiva/sclera: Conjunctivae normal.  Neck:     Musculoskeletal: Normal range of motion.  Cardiovascular:     Rate and Rhythm: Normal rate and regular rhythm.     Pulses: Normal pulses.          Radial pulses are 2+ on the right side and 2+ on the left side.  Pulmonary:     Effort: No tachypnea, accessory muscle usage, prolonged expiration, respiratory distress or retractions.     Breath sounds: No stridor.     Comments: Equal chest rise. No increased work of breathing. Abdominal:     General: There is no distension.     Palpations: Abdomen is soft.     Tenderness: There is abdominal tenderness in the right lower quadrant, suprapubic area and left lower quadrant. There is no right CVA tenderness, left CVA tenderness, guarding or rebound.  Musculoskeletal:     Comments: Moves all extremities equally and without difficulty.  Skin:    General: Skin is warm and dry.     Capillary Refill: Capillary refill takes less than 2 seconds.  Neurological:     Mental Status: She is alert.     GCS: GCS eye subscore is 4. GCS verbal subscore is 5. GCS motor subscore is 6.     Comments: Speech is clear and goal  oriented.  Psychiatric:        Mood and Affect: Mood normal.      ED Treatments / Results  Labs (all labs ordered are listed, but only abnormal results are displayed) Labs Reviewed  COMPREHENSIVE METABOLIC PANEL - Abnormal; Notable for the following components:      Result Value   CO2 21 (*)    All other components within normal limits  CBC - Abnormal; Notable for the following components:   WBC 11.2 (*)    All other components within normal limits  LIPASE, BLOOD  URINALYSIS, ROUTINE W REFLEX MICROSCOPIC  I-STAT BETA HCG BLOOD, ED ([facility], [facility], [facility] ONLY)    Radiology [place] Transvaginal Non-ob  Result Date: 06/14/2019 CLINICAL DATA:  Pelvic pain. EXAM: TRANSABDOMINAL AND TRANSVAGINAL ULTRASOUND OF PELVIS DOPPLER ULTRASOUND OF OVARIES TECHNIQUE: Both transabdominal and transvaginal ultrasound examinations of the pelvis were performed. Transabdominal technique was performed for global imaging of the pelvis including uterus, ovaries, adnexal regions, and pelvic cul-de-sac. It was necessary to proceed with endovaginal exam following the transabdominal exam to visualize the ovaries. Color and duplex Doppler ultrasound was utilized to evaluate blood flow to the ovaries. COMPARISON:  None. FINDINGS: Uterus Measurements: 7.9 x 4.5 x 5.0 cm = volume: 93.5 mL. No fibroids or other mass visualized. Endometrium Thickness: 10 mm.  Small amount of fluid in the endometrial cavity. Right ovary Measurements: 2.2 x 1.8 x 2.2 cm = volume: 4.6 mL. Normal appearance/no adnexal mass. Left ovary Measurements: 2.8 x 1.9 x 3.0 cm = volume: 8.3 mL.  2.1 cm cyst. Pulsed Doppler evaluation of both ovaries demonstrates normal low-resistance arterial and venous waveforms. Other findings No abnormal free fluid. IMPRESSION: Small benign-appearing cyst on the left ovary. Otherwise normal exam. Normal perfusion  to both ovaries. Electronically Signed   By: Francene BoyersJames  Maxwell M.D.   On: 06/14/2019 04:53   [place] Pelvis Complete  Result  Date: 06/14/2019 CLINICAL DATA:  Pelvic pain. EXAM: TRANSABDOMINAL AND TRANSVAGINAL ULTRASOUND OF PELVIS DOPPLER ULTRASOUND OF OVARIES TECHNIQUE: Both transabdominal and transvaginal ultrasound examinations of the pelvis were performed. Transabdominal technique was performed for global imaging of the pelvis including uterus, ovaries, adnexal regions, and pelvic cul-de-sac. It was necessary to proceed with endovaginal exam following the transabdominal exam to visualize the ovaries. Color and duplex Doppler ultrasound was utilized to evaluate blood flow to the ovaries. COMPARISON:  None. FINDINGS: Uterus Measurements: 7.9 x 4.5 x 5.0 cm = volume: 93.5 mL. No fibroids or other mass visualized. Endometrium Thickness: 10 mm.  Small amount of fluid in the endometrial cavity. Right ovary Measurements: 2.2 x 1.8 x 2.2 cm = volume: 4.6 mL. Normal appearance/no adnexal mass. Left ovary Measurements: 2.8 x 1.9 x 3.0 cm = volume: 8.3 mL.  2.1 cm cyst. Pulsed Doppler evaluation of both ovaries demonstrates normal low-resistance arterial and venous waveforms. Other findings No abnormal free fluid. IMPRESSION: Small benign-appearing cyst on the left ovary. Otherwise normal exam. Normal perfusion to both ovaries. Electronically Signed   By: Francene BoyersJames  Maxwell M.D.   On: 06/14/2019 04:53   [place] Art/ven Flow Abd Pelv Doppler  Result Date: 06/14/2019 CLINICAL DATA:  Pelvic pain. EXAM: TRANSABDOMINAL AND TRANSVAGINAL ULTRASOUND OF PELVIS DOPPLER ULTRASOUND OF OVARIES TECHNIQUE: Both transabdominal and transvaginal ultrasound examinations of the pelvis were performed. Transabdominal technique was performed for global imaging of the pelvis including uterus, ovaries, adnexal regions, and pelvic cul-de-sac. It was necessary to proceed with endovaginal exam following the transabdominal exam to visualize the ovaries. Color and duplex Doppler ultrasound was utilized to evaluate blood flow to the ovaries. COMPARISON:  None. FINDINGS: Uterus  Measurements: 7.9 x 4.5 x 5.0 cm = volume: 93.5 mL. No fibroids or other mass visualized. Endometrium Thickness: 10 mm.  Small amount of fluid in the endometrial cavity. Right ovary Measurements: 2.2 x 1.8 x 2.2 cm = volume: 4.6 mL. Normal appearance/no adnexal mass. Left ovary Measurements: 2.8 x 1.9 x 3.0 cm = volume: 8.3 mL.  2.1 cm cyst. Pulsed Doppler evaluation of both ovaries demonstrates normal low-resistance arterial and venous waveforms. Other findings No abnormal free fluid. IMPRESSION: Small benign-appearing cyst on the left ovary. Otherwise normal exam. Normal perfusion to both ovaries. Electronically Signed   By: Francene BoyersJames  Maxwell M.D.   On: 06/14/2019 04:53    Procedures Procedures (including critical care time)  Medications Ordered in ED Medications  sodium chloride flush (NS) 0.9 % injection 3 mL (has no administration in time range)  oxyCODONE (Oxy IR/ROXICODONE) immediate release tablet 10 mg (10 mg Oral Given 06/14/19 0305)     Initial Impression / Assessment and Plan / ED Course  I have reviewed the triage vital signs and the nursing notes.  Pertinent labs & imaging results that were available during my care of the patient were reviewed by me and considered in my medical decision making (see chart for details).         Presents with ongoing lower abdominal pain.  She recently had significant work-up for vaginal bleeding.  Labs were reassuring at that time.  There is no evidence of PID at that time.  HIV, syphilis, gonorrhea and chlamydia have all returned negative.  Lab work today with mild leukocytosis.  Negative pregnancy test.  Urinalysis without evidence of urinary tract infection.  Ultrasound  with left-sided ovarian cyst which may be the cause of patient's pain.  She is not persistently bleeding and is not anemic today.  She will need close follow-up with OB/GYN.  Long discussion with patient about the importance of this for further evaluation of her pain and bleeding.   Patient states understanding and is in agreement with the plan.  Final Clinical Impressions(s) / ED Diagnoses   Final diagnoses:  Pelvic pain  Cyst of left ovary    ED Discharge Orders    None       Evalyne Cortopassi, Boyd KerbsHannah, PA-C 06/14/19 0700    Dione BoozeGlick, David, MD 06/14/19 [phone]

## 2019-06-14 NOTE — Telephone Encounter (Signed)
Pt states that she is having pain in her right ovary and went to the ER last night and they stated for her to see Dr. Glo Herring within the next 2 days. Please advise.

## 2019-06-14 NOTE — Discharge Instructions (Signed)
1. Medications: Alternate tylenol and ibuprofen for pain control, usual home medications 2. Treatment: rest, drink plenty of fluids, advance diet slowly 3. Follow Up: Please followup with OB/GYN in 2-3 days for discussion of your diagnoses and further evaluation after today's visit; if you do not have a primary care doctor use the resource guide provided to find one; Please return to the ER for persistent vomiting, high fevers or worsening symptoms

## 2019-06-14 NOTE — ED Triage Notes (Signed)
Patient is complaining of excessive bleeding. Patient states that she has been having her period since June. Patient went to [facility] on 06/06/2019. Patient states she is not bleeding now but having lower abdomen pain. Patient states it burns when she urinates.

## 2019-06-15 ENCOUNTER — Encounter: Payer: Self-pay | Admitting: Obstetrics and Gynecology

## 2019-06-15 ENCOUNTER — Ambulatory Visit (INDEPENDENT_AMBULATORY_CARE_PROVIDER_SITE_OTHER): Payer: Self-pay | Admitting: Obstetrics and Gynecology

## 2019-06-15 VITALS — BP 140/89 | HR 103 | Wt 203.4 lb

## 2019-06-15 DIAGNOSIS — R102 Pelvic and perineal pain: Secondary | ICD-10-CM

## 2019-06-15 DIAGNOSIS — N83202 Unspecified ovarian cyst, left side: Secondary | ICD-10-CM

## 2019-06-15 MED ORDER — SULFAMETHOXAZOLE-TRIMETHOPRIM 800-160 MG PO TABS: 1.0000 | ORAL_TABLET | Freq: Two times a day (BID) | ORAL | 1 refills | Status: DC

## 2019-06-15 MED ORDER — PHENAZOPYRIDINE HCL 200 MG PO TABS: 200.0000 mg | ORAL_TABLET | Freq: Three times a day (TID) | ORAL | 0 refills | Status: DC | PRN

## 2019-06-15 NOTE — Patient Instructions (Signed)

## 2019-06-15 NOTE — Progress Notes (Signed)
[facility] Visit  @DATE @            Patient name: Latalia Etzler MRN [id number]  Date of birth: 06/21/1981  CC & HPI:  Davonda Ausley Scarlette Calico is a 38 y.o. female presenting today for ER visit x2 follow-up.  This 38 year old female with regular menses has developed irregular bleeding and pelvic discomfort.  She is been seen x2 at [facility] emergency rooms, wants to do any pain and then follow-up at [facility] within a week.  She first presented in [facility] with bleeding in mid June she bled until the 25th was seen for the pain and heavy bleeding.  She was stopped on the 25th from her bleeding.  She returned with spotting on the 28th evaluation did not identify any focal complaints of any pain she was seen due to the severity of the heavy bleeding "large chunks "that were expelling the tampons.  She was treated with Megace which she took she still had bleeding.  Throughout all this she had backache and "in both ovaries ultrasound showed a small 2 cm cyst on the left ovary was otherwise unremarkable  ROS:  ROS notable for being considered for endometrial ablation due to heavy discomfort and menses 2 years ago.  Due to insurance cancellation the surgery was not able to be performed.  She thinks [facility] Pap smear normal January 2018.  Anal condyloma removed 2017 Pertinent History Reviewed:   Reviewed: Significant for dysuria x3 weeks with a burning sensation.  She has insomnia.  She voids 6 times per day with 2 times per night she is felt warm but no thermometer testing.  She has occasional sweating not associated with any hot flashes  Family history:.  Single lives with children.  Her daughter is on disability due to OCD and ADHD with stealing and socially borderline behaviors her children's father is in prison for similar behaviors.  Patient is a single parent, with minim wage job Medical         Past Medical History:   Diagnosis Date  . Allergy    SEASONAL  . 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 Anxiety   . Asthma   . Back pain with right-sided sciatica 06/18/2016  . Body mass index 36.0-36.9, adult 04/10/2016  . Body mass index 37.0-37.9, adult 03/13/2016  . Depression   . Dysmenorrhea 04/10/2016  . GERD (gastroesophageal reflux disease)   . Hemorrhoids 02/10/2015  . Hiatal hernia   . Hyperlipidemia 04/09/2011  . Hypertension   . Menorrhagia with regular cycle 04/10/2016  . Muscle spasm of back 03/13/2016  . Obesity   . Ovarian cyst 12/18/2016  . Perianal wart 02/10/2015  . Stress 02/14/2016  . Weight loss counseling, encounter for 03/13/2016                              Surgical Hx:    Past Surgical History:  Procedure Laterality Date  . CESAREAN SECTION    . CHOLECYSTECTOMY    . COLONOSCOPY    . ESOPHAGOGASTRODUODENOSCOPY ENDOSCOPY    . TUBAL LIGATION     Medications: Reviewed & Updated - see associated section                       Current Outpatient Medications:  .  acetaminophen (TYLENOL) 500 MG tablet, Take 1,000 mg by  mouth every 6 (six) hours as needed for mild pain, moderate pain or headache., Disp: , Rfl:  .  phentermine 37.5 MG capsule, Take 37.5 mg by mouth every morning., Disp: , Rfl:  .  albuterol (PROVENTIL HFA;VENTOLIN HFA) 108 (90 Base) MCG/ACT inhaler, Inhale 1-2 puffs into the lungs every 6 (six) hours as needed for wheezing or shortness of breath. (Patient not taking: Reported on 06/15/2019), Disp: 1 Inhaler, Rfl: 0 .  ibuprofen (ADVIL) 200 MG tablet, Take 400-600 mg by mouth every 6 (six) hours as needed for headache, mild pain, moderate pain or cramping., Disp: , Rfl:  .  megestrol (MEGACE) 40 MG tablet, Take 1 tablet (40 mg total) by mouth 3 (three) times daily. Switch to once daily when bleeding stops. (Patient not taking: Reported on 06/14/2019), Disp: 45 tablet, Rfl: 0 .  phenazopyridine (PYRIDIUM) 200 MG tablet, Take 1 tablet (200 mg total) by mouth 3 (three) times daily as needed for pain.,  Disp: 12 tablet, Rfl: 0 .  sulfamethoxazole-trimethoprim (BACTRIM DS) 800-160 MG tablet, Take 1 tablet by mouth 2 (two) times daily., Disp: 14 tablet, Rfl: 1   Social History: Reviewed -  reports that she has been smoking cigarettes. She has a 20.00 pack-year smoking history. She has never used smokeless tobacco.  Objective Findings:  Vitals: Blood pressure 140/89, pulse (!) 103, weight 203 lb 6.4 oz (92.3 kg), last menstrual period 06/03/2019.  PHYSICAL EXAMINATION General appearance - alert, well appearing, and in no distress, oriented to person, place, and time, overweight and well hydrated Mental status - alert, oriented to person, place, and time, depressed mood Chest - clear to auscultation, no wheezes, rales or rhonchi, symmetric air entry Heart - normal rate and regular rhythm Abdomen - soft, nontender, nondistended, no masses or organomegaly Breasts -  Skin - normal coloration and turgor, no rashes, no suspicious skin lesions noted  PELVIC External genitalia -normal Vulva -normal Vagina -normal estrogen nonpurulent Cervix -nonpurulent, clear mucus, no lesions  Uterus -mobile nontender Adnexa -nontender Only tenderness is over the bladder [place] -  Rectal -     Assessment & Plan:   A:  1.  bladder pain with recent negative u/a 2. Small 2 cm left ov cyst, benign.  P:  1.  Pyridium 2. Septra 3. Pain calendar 4. [facility] discount reviewed. 5 continue tylenol and Ibuprofen.

## 2019-06-17 ENCOUNTER — Telehealth: Payer: Self-pay | Admitting: *Deleted

## 2019-06-17 NOTE — Telephone Encounter (Signed)
Pt has been bleeding X 3 weeks. Pt saw Dr. Glo Herring this week and is taking Megace. Med helps with bleeding but causes pain in ovary area and low back. Spoke with Dr. Glo Herring and he advised to take Ibuprofen and give it through the weekend. Pt's hgb is great. Dr. Glo Herring wants an update by Wednesday am if not better. Also advised a heating pad. Pt states she is getting one tonight. Pt voiced understanding. [place]

## 2019-06-17 NOTE — Telephone Encounter (Signed)
Patient called wanting to talk with a nurse regarding her bleeding

## 2019-06-23 ENCOUNTER — Telehealth: Payer: Self-pay | Admitting: Obstetrics and Gynecology

## 2019-06-23 ENCOUNTER — Other Ambulatory Visit: Payer: Self-pay | Admitting: Obstetrics and Gynecology

## 2019-06-23 MED ORDER — TRAMADOL HCL 50 MG PO TABS: 50.0000 mg | ORAL_TABLET | Freq: Four times a day (QID) | ORAL | 0 refills | Status: AC | PRN

## 2019-06-23 MED ORDER — TRAMADOL HCL 50 MG PO TABS: 50.0000 mg | ORAL_TABLET | Freq: Four times a day (QID) | ORAL | 0 refills | Status: DC | PRN

## 2019-06-23 MED ORDER — MEGESTROL ACETATE 40 MG PO TABS: 40.0000 mg | ORAL_TABLET | Freq: Three times a day (TID) | ORAL | 0 refills | Status: DC

## 2019-06-23 NOTE — Telephone Encounter (Signed)
Pt has been tried on Megace after ED visit , felt the Megace caused increase in pelvic pain. Pt agrees to restart the Megace, will Rx Tramadol, and see next week to reassess. Please have front office call pt and make f/u with me.

## 2019-06-23 NOTE — Telephone Encounter (Signed)
Pt states she called the after hours nurse last night and they advised her to go to the ER but pt states she has already been for this and did not go. She states she is in pain and has been bleeding for a month. She is requesting to speak with the nurse.

## 2019-06-23 NOTE — Telephone Encounter (Signed)
Rx 'd for tramadol and Megace, will see next week.

## 2019-06-23 NOTE — Progress Notes (Signed)
Given rx for Megace for Bleeding and Rx for 20 tramadol til pt can be seen next week.

## 2019-06-24 ENCOUNTER — Other Ambulatory Visit: Payer: Self-pay

## 2019-06-24 DIAGNOSIS — Z20822 Contact with and (suspected) exposure to covid-19: Secondary | ICD-10-CM

## 2019-06-29 LAB — NOVEL CORONAVIRUS, NAA: SARS-CoV-2, NAA: NOT DETECTED

## 2019-07-07 ENCOUNTER — Encounter: Payer: Self-pay | Admitting: Obstetrics and Gynecology

## 2019-07-07 ENCOUNTER — Other Ambulatory Visit: Payer: Self-pay

## 2019-07-07 ENCOUNTER — Ambulatory Visit (INDEPENDENT_AMBULATORY_CARE_PROVIDER_SITE_OTHER): Payer: Self-pay | Admitting: Obstetrics and Gynecology

## 2019-07-07 VITALS — BP 150/100 | HR 109 | Ht 61.0 in | Wt 203.8 lb

## 2019-07-07 DIAGNOSIS — N939 Abnormal uterine and vaginal bleeding, unspecified: Secondary | ICD-10-CM

## 2019-07-07 NOTE — Progress Notes (Signed)
Patient ID: Destiny Petty, female   DOB: [birth year], 38 y.o.   MRN: [id number]    [facility] Visit  @DATE @            Patient name: Destiny Petty MRN [id number]  Date of birth: 23-Dec-1980  CC & HPI:  Destiny Petty is a 38 y.o. female presenting today for follow up of vaginal bleeding and discuss surgical options to control bleeding. Is still having bleeding though she is not bleeding at present time of appointment, is taking megace. Had a single episode of pain while using megace, in early July. Hasn't had any pain with the medicine since. Still has lower knot feeling in her back, her legs have been hurting more. Pain legs only happens right before her period. Wanted to have ablation but didn't have insurance, went to medicaid to file for insurance to have coverage. Has been nauseous. All symptom started happening around June 6th 2020. Has gone through 3 boxes of tampons and pads in 1 month. She feels as if she is sick with the flu. Is fed up with feeling sickly and dealing with constant bleeding. Wants endo ablation as soon as possible since she now has coverage ( [place]). Hx tubal ligation ROS:  ROS +vaginal bleeding +lower back pain +lower leg pain +ovarian cysts -fever All systems are negative except as noted in the HPI and PMH.   Pertinent History Reviewed:   Reviewed: Medical         Past Medical History:  Diagnosis Date  . Allergy    SEASONAL  . 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 Anxiety   . Asthma   . Back pain with right-sided sciatica 06/18/2016  . Body mass index 36.0-36.9, adult 04/10/2016  . Body mass index 37.0-37.9, adult 03/13/2016  . Depression   . Dysmenorrhea 04/10/2016  . GERD (gastroesophageal reflux disease)   . Hemorrhoids 02/10/2015  . Hiatal hernia   . Hyperlipidemia 04/09/2011  . Hypertension   . Menorrhagia with regular cycle 04/10/2016  . Muscle spasm of back 03/13/2016  . Obesity   . Ovarian cyst 12/18/2016  . Perianal wart 02/10/2015   . Stress 02/14/2016  . Weight loss counseling, encounter for 03/13/2016                              Surgical Hx:    Past Surgical History:  Procedure Laterality Date  . CESAREAN SECTION    . CHOLECYSTECTOMY    . COLONOSCOPY    . ESOPHAGOGASTRODUODENOSCOPY ENDOSCOPY    . TUBAL LIGATION     Medications: Reviewed & Updated - see associated section                       Current Outpatient Medications:  .  acetaminophen (TYLENOL) 500 MG tablet, Take 1,000 mg by mouth every 6 (six) hours as needed for mild pain, moderate pain or headache., Disp: , Rfl:  .  megestrol (MEGACE) 40 MG tablet, Take 1 tablet (40 mg total) by mouth 3 (three) times daily. Switch to once daily when bleeding stops., Disp: 45 tablet, Rfl: 0 .  phentermine 37.5 MG capsule, Take 37.5 mg by mouth every morning., Disp: , Rfl:  .  albuterol (PROVENTIL HFA;VENTOLIN HFA) 108 (90 Base) MCG/ACT inhaler, Inhale 1-2 puffs into the lungs every 6 (six) hours as needed for wheezing or shortness of breath. (Patient not taking: Reported on  06/15/2019), Disp: 1 Inhaler, Rfl: 0 .  ibuprofen (ADVIL) 200 MG tablet, Take 400-600 mg by mouth every 6 (six) hours as needed for headache, mild pain, moderate pain or cramping., Disp: , Rfl:  .  phenazopyridine (PYRIDIUM) 200 MG tablet, Take 1 tablet (200 mg total) by mouth 3 (three) times daily as needed for pain. (Patient not taking: Reported on 07/07/2019), Disp: 12 tablet, Rfl: 0 .  sulfamethoxazole-trimethoprim (BACTRIM DS) 800-160 MG tablet, Take 1 tablet by mouth 2 (two) times daily. (Patient not taking: Reported on 07/07/2019), Disp: 14 tablet, Rfl: 1   Social History: Reviewed -  reports that she has been smoking cigarettes. She has a 20.00 pack-year smoking history. She has never used smokeless tobacco.  Objective Findings:  Vitals: Blood pressure (!) 150/100, pulse (!) 109, height 5\' 1"  (1.549 m), weight 203 lb 12.8 oz (92.4 kg), last menstrual period 05/23/2019.  PHYSICAL  EXAMINATION General appearance - alert, well appearing, and in no distress Mental status - alert, oriented to person, place, and time, normal mood, behavior, speech, dress, motor activity, and thought processes, affect appropriate to mood  PELVIC Discussion only Recent u/s:  CLINICAL DATA:  Pelvic pain.  EXAM: TRANSABDOMINAL AND TRANSVAGINAL ULTRASOUND OF PELVIS  DOPPLER ULTRASOUND OF OVARIES  TECHNIQUE: Both transabdominal and transvaginal ultrasound examinations of the pelvis were performed. Transabdominal technique was performed for global imaging of the pelvis including uterus, ovaries, adnexal regions, and pelvic cul-de-sac.  It was necessary to proceed with endovaginal exam following the transabdominal exam to visualize the ovaries. Color and duplex Doppler ultrasound was utilized to evaluate blood flow to the ovaries.  COMPARISON:  None.  FINDINGS: Uterus  Measurements: 7.9 x 4.5 x 5.0 cm = volume: 93.5 mL. No fibroids or other mass visualized.  Endometrium  Thickness: 10 mm.  Small amount of fluid in the endometrial cavity.  Right ovary  Measurements: 2.2 x 1.8 x 2.2 cm = volume: 4.6 mL. Normal appearance/no adnexal mass.  Left ovary  Measurements: 2.8 x 1.9 x 3.0 cm = volume: 8.3 mL.  2.1 cm cyst.  Pulsed Doppler evaluation of both ovaries demonstrates normal low-resistance arterial and venous waveforms.  Other findings  No abnormal free fluid.  IMPRESSION: Small benign-appearing cyst on the left ovary. Otherwise normal exam. Normal perfusion to both ovaries.   Electronically Signed   By: Francene BoyersJames  Maxwell M.D.   On: 06/14/2019 04:53   Assessment & Plan:   A:  1.  AUB  P:  1. 2 week pre-op. Will need endometrial biopsy 2. Endo ablation to be scheduled after endometrial biopsy sampling,     By signing my name below, I, Arnette NorrisMari Johnson, attest that this documentation has been prepared under the direction and in the  presence of Tilda BurrowFerguson, Javoris Star V, MD. Electronically Signed: Arnette NorrisMari Johnson Medical Scribe. 07/07/19. 4:34 PM.  I personally performed the services described in this documentation, which was SCRIBED in my presence. The recorded information has been reviewed and considered accurate. It has been edited as necessary during review. Tilda BurrowJohn V Tesslyn Baumert, MD

## 2019-07-16 ENCOUNTER — Telehealth: Payer: Self-pay | Admitting: Obstetrics and Gynecology

## 2019-07-16 NOTE — Telephone Encounter (Signed)
Patient called requesting medication for bleeding.  Stated Dr. Glo Herring called her some in, but she didn't pick it up bc she still had some from the hospital.  She is scheduled for surgery this month.  [employer]  [phone]

## 2019-07-23 ENCOUNTER — Ambulatory Visit (INDEPENDENT_AMBULATORY_CARE_PROVIDER_SITE_OTHER): Payer: Medicaid Other | Admitting: Obstetrics and Gynecology

## 2019-07-23 ENCOUNTER — Other Ambulatory Visit: Payer: Self-pay | Admitting: Obstetrics and Gynecology

## 2019-07-23 ENCOUNTER — Other Ambulatory Visit (HOSPITAL_COMMUNITY)
Admission: RE | Admit: 2019-07-23 | Discharge: 2019-07-23 | Disposition: A | Discharge status: Home / Self Care | Payer: Medicaid Other | Source: Ambulatory Visit | Attending: Obstetrics and Gynecology | Admitting: Obstetrics and Gynecology

## 2019-07-23 ENCOUNTER — Encounter: Payer: Self-pay | Admitting: Obstetrics and Gynecology

## 2019-07-23 ENCOUNTER — Other Ambulatory Visit: Payer: Self-pay

## 2019-07-23 VITALS — BP 129/78 | HR 86 | Ht 61.0 in | Wt 207.0 lb

## 2019-07-23 DIAGNOSIS — N920 Excessive and frequent menstruation with regular cycle: Secondary | ICD-10-CM

## 2019-07-23 DIAGNOSIS — Z01818 Encounter for other preprocedural examination: Secondary | ICD-10-CM | POA: Insufficient documentation

## 2019-07-23 DIAGNOSIS — N946 Dysmenorrhea, unspecified: Secondary | ICD-10-CM | POA: Diagnosis not present

## 2019-07-23 MED ORDER — MEGESTROL ACETATE 40 MG PO TABS: 40.0000 mg | ORAL_TABLET | Freq: Three times a day (TID) | ORAL | 0 refills | Status: DC

## 2019-07-23 NOTE — Progress Notes (Addendum)
Patient ID: Destiny Petty, female   DOB: [birth year], 38 y.o.   MRN: [id number] Preoperative History and Physical  Destiny Petty Petty is a 38 y.o. [id number] here for surgical management of AUB.   No significant preoperative concerns. Will repeat pap with GC / CHL and obtain endometrial biopsy.  Proposed surgery: Endometrial ablation with Minerva.  Past Medical History:  Diagnosis Date  . Allergy    SEASONAL  . 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 Anxiety   . Asthma   . Back pain with right-sided sciatica 06/18/2016  . Body mass index 36.0-36.9, adult 04/10/2016  . Body mass index 37.0-37.9, adult 03/13/2016  . Depression   . Dysmenorrhea 04/10/2016  . GERD (gastroesophageal reflux disease)   . Hemorrhoids 02/10/2015  . Hiatal hernia   . Hyperlipidemia 04/09/2011  . Hypertension   . Menorrhagia with regular cycle 04/10/2016  . Muscle spasm of back 03/13/2016  . Obesity   . Ovarian cyst 12/18/2016  . Perianal wart 02/10/2015  . Stress 02/14/2016  . Weight loss counseling, encounter for 03/13/2016   Past Surgical History:  Procedure Laterality Date  . CESAREAN SECTION    . CHOLECYSTECTOMY    . COLONOSCOPY    . ESOPHAGOGASTRODUODENOSCOPY ENDOSCOPY    . TUBAL LIGATION     OB History  Gravida Para Term Preterm AB Living  2 2 2     2   SAB TAB Ectopic Multiple Live Births          2    # Outcome Date GA Lbr Len/2nd Weight Sex Delivery Anes PTL Lv  2 Term 09/05/06 3333w0d   F Vag-Spont   LIV  1 Term 07/01/03 4433w0d   M CS-LTranv   LIV  Patient denies any other pertinent gynecologic issues.   Current Outpatient Medications on File Prior to Visit  Medication Sig Dispense Refill  . acetaminophen (TYLENOL) 500 MG tablet Take 1,000 mg by mouth every 6 (six) hours as needed for mild pain, moderate pain or headache.    . albuterol (PROVENTIL HFA;VENTOLIN HFA) 108 (90 Base) MCG/ACT inhaler Inhale 1-2 puffs into the lungs every 6 (six) hours as needed for wheezing or shortness of breath. (Patient not  taking: Reported on 06/15/2019) 1 Inhaler 0  . ibuprofen (ADVIL) 200 MG tablet Take 400-600 mg by mouth every 6 (six) hours as needed for headache, mild pain, moderate pain or cramping.    . megestrol (MEGACE) 40 MG tablet Take 1 tablet (40 mg total) by mouth 3 (three) times daily. Switch to once daily when bleeding stops. 45 tablet 0  . phenazopyridine (PYRIDIUM) 200 MG tablet Take 1 tablet (200 mg total) by mouth 3 (three) times daily as needed for pain. (Patient not taking: Reported on 07/07/2019) 12 tablet 0  . phentermine 37.5 MG capsule Take 37.5 mg by mouth every morning.    . sulfamethoxazole-trimethoprim (BACTRIM DS) 800-160 MG tablet Take 1 tablet by mouth 2 (two) times daily. (Patient not taking: Reported on 07/07/2019) 14 tablet 1   No current facility-administered medications on file prior to visit.    Allergies  Allergen Reactions  . Latex Itching  . Lisinopril Cough    coughing    Social History:   reports that she has been smoking cigarettes. She has a 20.00 pack-year smoking history. She has never used smokeless tobacco. She reports current alcohol use. She reports that she does not use drugs.  Family History  Problem Relation Age of Onset  . Diabetes Mother   .  Kidney failure Mother   . Early death Mother 18       diabetes complications  . Cancer Father 64       colon cancer  . Depression Sister   . Hypertension Sister   . Anxiety disorder Sister   . Other Sister        back problems  . ADD / ADHD Daughter   . Other Daughter        behavioral issues  . ODD Daughter   . Asthma Son   . Cancer Maternal Grandmother        breast  . Seizures Maternal Grandmother   . Lupus Other     Review of Systems: Noncontributory  PHYSICAL EXAM: There were no vitals taken for this visit. General appearance - alert, well appearing, and in no distress Chest - clear to auscultation, no wheezes, rales or rhonchi, symmetric air entry Heart - normal rate and regular  rhythm Abdomen - soft, nontender, nondistended, no masses or organomegaly Pelvic - VAGINA: normal appearing vagina  CERVIX: normal appearing cervix without discharge or lesions, multiparous os,  UTERUS: uterus is normal size, shape, consistency and nontender, anteverted, mobile. PAP: Pap smear done today.  ENDOMETRIAL BIOPSY Endo bx reproduces pt dysmenorrhea  Patient given informed consent, signed copy in the chart, time out was performed. Appropriate time out taken. . The patient was placed in the lithotomy position and the cervix brought into view with sterile speculum.  Portio of cervix cleansed x 2 with betadine swabs.  A tenaculum was placed in the anterior lip of the cervix.  The uterus was sounded for depth of 9 cm. A pipelle was introduced to into the uterus, suction created,  and an endometrial sample was obtained. All equipment was removed and accounted for.  The patient tolerated the procedure well.    Patient given post procedure instructions. The patient will return in 2 weeks for results.    Extremities - peripheral pulses normal, no pedal edema, no clubbing or cyanosis  Labs: No results found for this or any previous visit (from the past 336 hour(s)).  Imaging Studies: No results found.  Assessment: menorrhagia                         Dysmenorrhea. Patient Active Problem List   Diagnosis Date Noted  . Ovarian cyst 12/18/2016  . 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 Numbness and tingling in left hand 08/19/2016  . Back pain with right-sided sciatica 06/18/2016  . Menorrhagia 04/10/2016  . Dysmenorrhea 04/10/2016  . Muscle spasm of back 03/13/2016  . Obesity 03/13/2016  . Weight loss counseling, encounter for 03/13/2016  . Stress 02/14/2016  . Bipolar disorder ([place]) 01/15/2016  . Hiatal hernia 01/15/2016  . Migraine 01/15/2016  . Tobacco use disorder 07/10/2013  . GERD (gastroesophageal reflux disease) 07/10/2013  . HTN (hypertension)  07/08/2013  . Asthma, mild persistent 04/05/2013  . Hyperlipidemia 04/09/2011  . Depressive disorder, not elsewhere classified 03/27/2007  . Allergic rhinitis 08/16/2003    Plan: Patient will undergo surgical management with endometrial ablation with Minerva.  Pt requests Friday surgery if possible, will look at early Sept options. Refill megace   By signing my name below, I, Samul Dada, attest that this documentation has been prepared under the direction and in the presence of Jonnie Kind, MD. Electronically Signed: [place]. 07/23/19. 9:27 AM.  I personally performed the services described in this documentation,  which was SCRIBED in my presence. The recorded information has been reviewed and considered accurate. It has been edited as necessary during review. Jonnie Kind, MD

## 2019-07-23 NOTE — Patient Instructions (Signed)
Hysteroscopy Hysteroscopy is a procedure that is used to examine the inside of a woman's womb (uterus). This may be done for various reasons, including:  To look for lumps (tumors) and other growths in the uterus.  To evaluate abnormal bleeding, fibroid tumors, polyps, scar tissue (adhesions), or cancer of the uterus.  To determine the cause of an inability to get pregnant (infertility) or repeated losses of pregnancies (miscarriages).  To find a lost IUD (intrauterine device).  To perform a procedure that permanently prevents pregnancy (sterilization). During this procedure, a thin, flexible tube with a small light and camera (hysteroscope) is used to examine the uterus. The camera sends images to a monitor in the room so that your health care provider can view the inside of your uterus. A hysteroscopy should be done right after a menstrual period to make sure that you are not pregnant. Tell a health care provider about:  Any allergies you have.  All medicines you are taking, including vitamins, herbs, eye drops, creams, and over-the-counter medicines.  Any problems you or family members have had with the use of anesthetic medicines.  Any blood disorders you have.  Any surgeries you have had.  Any medical conditions you have.  Whether you are pregnant or may be pregnant. What are the risks? Generally, this is a safe procedure. However, problems may occur, including:  Excessive bleeding.  Infection.  Damage to the uterus or other structures or organs.  Allergic reaction to medicines or fluids that are used in the procedure. What happens before the procedure? Staying hydrated Follow instructions from your health care provider about hydration, which may include:  Up to 2 hours before the procedure - you may continue to drink clear liquids, such as water, clear fruit juice, black coffee, and plain tea. Eating and drinking restrictions Follow instructions from your health care  provider about eating and drinking, which may include:  8 hours before the procedure - stop eating solid foods and drink clear liquids only  2 hours before the procedure - stop drinking clear liquids. General instructions  Ask your health care provider about: ? Changing or stopping your normal medicines. This is important if you take diabetes medicines or blood thinners. ? Taking medicines such as aspirin and ibuprofen. These medicines can thin your blood and cause bleeding. Do not take these medicines for 1 week before your procedure, or as told by your health care provider.  Do not use any products that contain nicotine or tobacco for 2 weeks before the procedure. This includes cigarettes and e-cigarettes. If you need help quitting, ask your health care provider.  Medicine may be placed in your cervix the day before the procedure. This medicine causes the cervix to have a larger opening (dilate). The larger opening makes it easier for the hysteroscope to be inserted into the uterus during the procedure.  Plan to have someone with you for the first 24-48 hours after the procedure, especially if you are given a medicine to make you fall asleep (general anesthetic).  Plan to have someone take you home from the hospital or clinic. What happens during the procedure?  To lower your risk of infection: ? Your health care team will wash or sanitize their hands. ? Your skin will be washed with soap. ? Hair may be removed from the surgical area.  An IV tube will be inserted into one of your veins.  You may be given one or more of the following: ? A medicine to help   you relax (sedative). ? A medicine that numbs the area around the cervix (local anesthetic). ? A medicine to make you fall asleep (general anesthetic).  A hysteroscope will be inserted through your vagina and into your uterus.  Air or fluid will be used to enlarge your uterus, enabling your health care provider to see your uterus  better. The amount of fluid used will be carefully checked throughout the procedure.  In some cases, tissue may be gently scraped from inside the uterus and sent to a lab for testing (biopsy). The procedure may vary among health care providers and hospitals. What happens after the procedure?  Your blood pressure, heart rate, breathing rate, and blood oxygen level will be monitored until the medicines you were given have worn off.  You may have some cramping. You may be given medicines for this.  You may have bleeding, which varies from light spotting to menstrual-like bleeding. This is normal.  If you had a biopsy done, it is your responsibility to get the results of your procedure. Ask your health care provider, or the department performing the procedure, when your results will be ready. Summary  Hysteroscopy is a procedure that is used to examine the inside of a woman's womb (uterus).  After the procedure, you may have bleeding, which varies from light spotting to menstrual-like bleeding. This is normal. You may also have cramping.  Plan to have someone take you home from the hospital or clinic. This information is not intended to replace advice given to you by your health care provider. Make sure you discuss any questions you have with your health care provider. Document Released: 03/03/2001 Document Revised: 11/07/2017 Document Reviewed: 12/24/2016 Elsevier Patient Education  2020 [employer]  

## 2019-07-27 LAB — CYTOLOGY - PAP
Chlamydia: NEGATIVE
Diagnosis: NEGATIVE
HPV: NOT DETECTED
Neisseria Gonorrhea: NEGATIVE

## 2019-08-09 ENCOUNTER — Other Ambulatory Visit: Payer: Self-pay | Admitting: Obstetrics & Gynecology

## 2019-08-09 ENCOUNTER — Telehealth: Payer: Self-pay | Admitting: Obstetrics and Gynecology

## 2019-08-09 MED ORDER — MEGESTROL ACETATE 40 MG PO TABS: 40.0000 mg | ORAL_TABLET | Freq: Three times a day (TID) | ORAL | 0 refills | Status: DC

## 2019-08-09 NOTE — Telephone Encounter (Signed)
Voice mail not set up @ 11:07 am. Unable to let pt know med was sent to pharmacy. [place]

## 2019-08-09 NOTE — Telephone Encounter (Signed)
PT is having surgery Sept 22 and is needing a refill on her megace called in to [place] [facility] she is going to be out and is going out of town Architectural technologist.

## 2019-08-09 NOTE — Telephone Encounter (Signed)
done

## 2019-08-26 ENCOUNTER — Other Ambulatory Visit: Payer: Self-pay | Admitting: Obstetrics and Gynecology

## 2019-08-26 NOTE — H&P (View-Only) (Signed)
Destiny Petty is a 38 y.o. [id number] here for surgical management of AUB.   No significant preoperative concerns. Will repeat pap with GC / CHL and obtain endometrial biopsy.  Proposed surgery: Endometrial ablation with Minerva.      Past Medical History:  Diagnosis Date  . Allergy    SEASONAL  . 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 Anxiety   . Asthma   . Back pain with right-sided sciatica 06/18/2016  . Body mass index 36.0-36.9, adult 04/10/2016  . Body mass index 37.0-37.9, adult 03/13/2016  . Depression   . Dysmenorrhea 04/10/2016  . GERD (gastroesophageal reflux disease)   . Hemorrhoids 02/10/2015  . Hiatal hernia   . Hyperlipidemia 04/09/2011  . Hypertension   . Menorrhagia with regular cycle 04/10/2016  . Muscle spasm of back 03/13/2016  . Obesity   . Ovarian cyst 12/18/2016  . Perianal wart 02/10/2015  . Stress 02/14/2016  . Weight loss counseling, encounter for 03/13/2016        Past Surgical History:  Procedure Laterality Date  . CESAREAN SECTION    . CHOLECYSTECTOMY    . COLONOSCOPY    . ESOPHAGOGASTRODUODENOSCOPY ENDOSCOPY    . TUBAL LIGATION                     OB History  Gravida Para Term Preterm AB Living  2 2 2     2   SAB TAB Ectopic Multiple Live Births             2       # Outcome Date GA Lbr Len/2nd Weight Sex Delivery Anes PTL Lv  2 Term 09/05/06 4952w0d   F Vag-Spont   LIV  1 Term 07/01/03 2952w0d   M CS-LTranv   LIV  Patient denies any other pertinent gynecologic issues.         Current Outpatient Medications on File Prior to Visit  Medication Sig Dispense Refill  . acetaminophen (TYLENOL) 500 MG tablet Take 1,000 mg by mouth every 6 (six) hours as needed for mild pain, moderate pain or headache.    . albuterol (PROVENTIL HFA;VENTOLIN HFA) 108 (90 Base) MCG/ACT inhaler Inhale 1-2 puffs into the lungs every 6 (six) hours as needed for wheezing or shortness of breath. (Patient not taking: Reported on 06/15/2019) 1 Inhaler 0  .  ibuprofen (ADVIL) 200 MG tablet Take 400-600 mg by mouth every 6 (six) hours as needed for headache, mild pain, moderate pain or cramping.    . megestrol (MEGACE) 40 MG tablet Take 1 tablet (40 mg total) by mouth 3 (three) times daily. Switch to once daily when bleeding stops. 45 tablet 0  . phenazopyridine (PYRIDIUM) 200 MG tablet Take 1 tablet (200 mg total) by mouth 3 (three) times daily as needed for pain. (Patient not taking: Reported on 07/07/2019) 12 tablet 0  . phentermine 37.5 MG capsule Take 37.5 mg by mouth every morning.    . sulfamethoxazole-trimethoprim (BACTRIM DS) 800-160 MG tablet Take 1 tablet by mouth 2 (two) times daily. (Patient not taking: Reported on 07/07/2019) 14 tablet 1   No current facility-administered medications on file prior to visit.         Allergies  Allergen Reactions  . Latex Itching  . Lisinopril Cough    coughing    Social History:   reports that she has been smoking cigarettes. She has a 20.00 pack-year smoking history. She has never used smokeless tobacco. She reports current alcohol use. She reports  that she does not use drugs.       Family History  Problem Relation Age of Onset  . Diabetes Mother   . Kidney failure Mother   . Early death Mother 56       diabetes complications  . Cancer Father 13       colon cancer  . Depression Sister   . Hypertension Sister   . Anxiety disorder Sister   . Other Sister        back problems  . ADD / ADHD Daughter   . Other Daughter        behavioral issues  . ODD Daughter   . Asthma Son   . Cancer Maternal Grandmother        breast  . Seizures Maternal Grandmother   . Lupus Other     Review of Systems: Noncontributory  PHYSICAL EXAM: There were no vitals taken for this visit. General appearance - alert, well appearing, and in no distress Chest - clear to auscultation, no wheezes, rales or rhonchi, symmetric air entry Heart - normal rate and regular rhythm Abdomen -  soft, nontender, nondistended, no masses or organomegaly Pelvic - VAGINA: normal appearing vagina  CERVIX: normal appearing cervix without discharge or lesions, multiparous os,  UTERUS: uterus is normal size, shape, consistency and nontender, anteverted, mobile. PAP: Pap smear done today.  ENDOMETRIAL BIOPSY Endo bx reproduces pt dysmenorrhea  Patient given informed consent, signed copy in the chart, time out was performed. Appropriate time out taken. . The patient was placed in the lithotomy position and the cervix brought into view with sterile speculum.  Portio of cervix cleansed x 2 with betadine swabs.  A tenaculum was placed in the anterior lip of the cervix.  The uterus was sounded for depth of 9 cm. A pipelle was introduced to into the uterus, suction created,  and an endometrial sample was obtained. All equipment was removed and accounted for.  The patient tolerated the procedure well.    Patient given post procedure instructions. The patient will return in 2 weeks for results.    Extremities - peripheral pulses normal, no pedal edema, no clubbing or cyanosis  Labs: No results found for this or any previous visit (from the past 336 hour(s)).  Imaging Studies: Imaging Results  No results found.    Assessment: menorrhagia                         Dysmenorrhea.     Patient Active Problem List   Diagnosis Date Noted  . Ovarian cyst 12/18/2016  . 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 Numbness and tingling in left hand 08/19/2016  . Back pain with right-sided sciatica 06/18/2016  . Menorrhagia 04/10/2016  . Dysmenorrhea 04/10/2016  . Muscle spasm of back 03/13/2016  . Obesity 03/13/2016  . Weight loss counseling, encounter for 03/13/2016  . Stress 02/14/2016  . Bipolar disorder ([place]) 01/15/2016  . Hiatal hernia 01/15/2016  . Migraine 01/15/2016  . Tobacco use disorder 07/10/2013  . GERD (gastroesophageal reflux disease) 07/10/2013  . HTN  (hypertension) 07/08/2013  . Asthma, mild persistent 04/05/2013  . Hyperlipidemia 04/09/2011  . Depressive disorder, not elsewhere classified 03/27/2007  . Allergic rhinitis 08/16/2003    Plan: Patient will undergo surgical management with endometrial ablation with Minerva.   Refill megace   By signing my name below, I, Samul Dada, attest that this documentation has been prepared under the direction and in the  presence of Eljay Lave V, MD. Electronically Signed: Mari Johnson Medical Scribe. 07/23/19. 9:27 AM.  I personally performed the services described in this documentation, which was SCRIBED in my presence. The recorded information has been reviewed and considered accurate. It has been edited as necessary during review. Theoplis Garciagarcia V Alma Mohiuddin, MD    

## 2019-08-26 NOTE — Progress Notes (Signed)
Destiny Petty is a 38 y.o. [id number] here for surgical management of AUB.   No significant preoperative concerns. Will repeat pap with GC / CHL and obtain endometrial biopsy.  Proposed surgery: Endometrial ablation with Minerva.      Past Medical History:  Diagnosis Date  . Allergy    SEASONAL  . 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 Anxiety   . Asthma   . Back pain with right-sided sciatica 06/18/2016  . Body mass index 36.0-36.9, adult 04/10/2016  . Body mass index 37.0-37.9, adult 03/13/2016  . Depression   . Dysmenorrhea 04/10/2016  . GERD (gastroesophageal reflux disease)   . Hemorrhoids 02/10/2015  . Hiatal hernia   . Hyperlipidemia 04/09/2011  . Hypertension   . Menorrhagia with regular cycle 04/10/2016  . Muscle spasm of back 03/13/2016  . Obesity   . Ovarian cyst 12/18/2016  . Perianal wart 02/10/2015  . Stress 02/14/2016  . Weight loss counseling, encounter for 03/13/2016        Past Surgical History:  Procedure Laterality Date  . CESAREAN SECTION    . CHOLECYSTECTOMY    . COLONOSCOPY    . ESOPHAGOGASTRODUODENOSCOPY ENDOSCOPY    . TUBAL LIGATION                     OB History  Gravida Para Term Preterm AB Living  2 2 2     2   SAB TAB Ectopic Multiple Live Births             2       # Outcome Date GA Lbr Len/2nd Weight Sex Delivery Anes PTL Lv  2 Term 09/05/06 4952w0d   F Vag-Spont   LIV  1 Term 07/01/03 2952w0d   M CS-LTranv   LIV  Patient denies any other pertinent gynecologic issues.         Current Outpatient Medications on File Prior to Visit  Medication Sig Dispense Refill  . acetaminophen (TYLENOL) 500 MG tablet Take 1,000 mg by mouth every 6 (six) hours as needed for mild pain, moderate pain or headache.    . albuterol (PROVENTIL HFA;VENTOLIN HFA) 108 (90 Base) MCG/ACT inhaler Inhale 1-2 puffs into the lungs every 6 (six) hours as needed for wheezing or shortness of breath. (Patient not taking: Reported on 06/15/2019) 1 Inhaler 0  .  ibuprofen (ADVIL) 200 MG tablet Take 400-600 mg by mouth every 6 (six) hours as needed for headache, mild pain, moderate pain or cramping.    . megestrol (MEGACE) 40 MG tablet Take 1 tablet (40 mg total) by mouth 3 (three) times daily. Switch to once daily when bleeding stops. 45 tablet 0  . phenazopyridine (PYRIDIUM) 200 MG tablet Take 1 tablet (200 mg total) by mouth 3 (three) times daily as needed for pain. (Patient not taking: Reported on 07/07/2019) 12 tablet 0  . phentermine 37.5 MG capsule Take 37.5 mg by mouth every morning.    . sulfamethoxazole-trimethoprim (BACTRIM DS) 800-160 MG tablet Take 1 tablet by mouth 2 (two) times daily. (Patient not taking: Reported on 07/07/2019) 14 tablet 1   No current facility-administered medications on file prior to visit.         Allergies  Allergen Reactions  . Latex Itching  . Lisinopril Cough    coughing    Social History:   reports that she has been smoking cigarettes. She has a 20.00 pack-year smoking history. She has never used smokeless tobacco. She reports current alcohol use. She reports  that she does not use drugs.       Family History  Problem Relation Age of Onset  . Diabetes Mother   . Kidney failure Mother   . Early death Mother 56       diabetes complications  . Cancer Father 13       colon cancer  . Depression Sister   . Hypertension Sister   . Anxiety disorder Sister   . Other Sister        back problems  . ADD / ADHD Daughter   . Other Daughter        behavioral issues  . ODD Daughter   . Asthma Son   . Cancer Maternal Grandmother        breast  . Seizures Maternal Grandmother   . Lupus Other     Review of Systems: Noncontributory  PHYSICAL EXAM: There were no vitals taken for this visit. General appearance - alert, well appearing, and in no distress Chest - clear to auscultation, no wheezes, rales or rhonchi, symmetric air entry Heart - normal rate and regular rhythm Abdomen -  soft, nontender, nondistended, no masses or organomegaly Pelvic - VAGINA: normal appearing vagina  CERVIX: normal appearing cervix without discharge or lesions, multiparous os,  UTERUS: uterus is normal size, shape, consistency and nontender, anteverted, mobile. PAP: Pap smear done today.  ENDOMETRIAL BIOPSY Endo bx reproduces pt dysmenorrhea  Patient given informed consent, signed copy in the chart, time out was performed. Appropriate time out taken. . The patient was placed in the lithotomy position and the cervix brought into view with sterile speculum.  Portio of cervix cleansed x 2 with betadine swabs.  A tenaculum was placed in the anterior lip of the cervix.  The uterus was sounded for depth of 9 cm. A pipelle was introduced to into the uterus, suction created,  and an endometrial sample was obtained. All equipment was removed and accounted for.  The patient tolerated the procedure well.    Patient given post procedure instructions. The patient will return in 2 weeks for results.    Extremities - peripheral pulses normal, no pedal edema, no clubbing or cyanosis  Labs: No results found for this or any previous visit (from the past 336 hour(s)).  Imaging Studies: Imaging Results  No results found.    Assessment: menorrhagia                         Dysmenorrhea.     Patient Active Problem List   Diagnosis Date Noted  . Ovarian cyst 12/18/2016  . 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 Numbness and tingling in left hand 08/19/2016  . Back pain with right-sided sciatica 06/18/2016  . Menorrhagia 04/10/2016  . Dysmenorrhea 04/10/2016  . Muscle spasm of back 03/13/2016  . Obesity 03/13/2016  . Weight loss counseling, encounter for 03/13/2016  . Stress 02/14/2016  . Bipolar disorder ([place]) 01/15/2016  . Hiatal hernia 01/15/2016  . Migraine 01/15/2016  . Tobacco use disorder 07/10/2013  . GERD (gastroesophageal reflux disease) 07/10/2013  . HTN  (hypertension) 07/08/2013  . Asthma, mild persistent 04/05/2013  . Hyperlipidemia 04/09/2011  . Depressive disorder, not elsewhere classified 03/27/2007  . Allergic rhinitis 08/16/2003    Plan: Patient will undergo surgical management with endometrial ablation with Minerva.   Refill megace   By signing my name below, I, Samul Dada, attest that this documentation has been prepared under the direction and in the  presence of Tilda Burrow, MD. Electronically Signed: Arnette Norris Medical Scribe. 07/23/19. 9:27 AM.  I personally performed the services described in this documentation, which was SCRIBED in my presence. The recorded information has been reviewed and considered accurate. It has been edited as necessary during review. Tilda Burrow, MD

## 2019-08-26 NOTE — Patient Instructions (Signed)
Destiny Petty  08/26/2019     @PREFPERIOPPHARMACY @   Your procedure is scheduled on  08/31/2019   Report to [facility] at  615  A.M.  Call this number if you have problems the morning of surgery:  [phone]   Remember:  Do not eat or drink after midnight.                        Take these medicines the morning of surgery with A SIP OF WATER  None. Use your inhaler before you come.    Do not wear jewelry, make-up or nail polish.  Do not wear lotions, powders, or perfumes. Please wear deodorant and brush your teeth.  Do not shave 48 hours prior to surgery.  Men may shave face and neck.  Do not bring valuables to the hospital.  [facility] is not responsible for any belongings or valuables.  Contacts, dentures or bridgework may not be worn into surgery.  Leave your suitcase in the car.  After surgery it may be brought to your room.  For patients admitted to the hospital, discharge time will be determined by your treatment team.  Patients discharged the day of surgery will not be allowed to drive home.   Name and phone number of your driver:   family Special instructions:  None  Please read over the following fact sheets that you were given. Anesthesia Post-op Instructions and Care and Recovery After Surgery       Dilation and Curettage or Vacuum Curettage, Care After These instructions give you information about caring for yourself after your procedure. Your doctor may also give you more specific instructions. Call your doctor if you have any problems or questions after your procedure. Follow these instructions at home: Activity  Do not drive or use heavy machinery while taking prescription pain medicine.  For 24 hours after your procedure, avoid driving.  Take short walks often, followed by rest periods. Ask your doctor what activities are safe for you. After one or two days, you may be able to return to your normal activities.  Do not  lift anything that is heavier than 10 lb (4.5 kg) until your doctor approves.  For at least 2 weeks, or as long as told by your doctor: ? Do not douche. ? Do not use tampons. ? Do not have sex. General instructions   Take over-the-counter and prescription medicines only as told by your doctor. This is very important if you take blood thinning medicine.  Do not take baths, swim, or use a hot tub until your doctor approves. Take showers instead of baths.  Wear compression stockings as told by your doctor.  It is up to you to get the results of your procedure. Ask your doctor when your results will be ready.  Keep all follow-up visits as told by your doctor. This is important. Contact a doctor if:  You have very bad cramps that get worse or do not get better with medicine.  You have very bad pain in your belly (abdomen).  You cannot drink fluids without throwing up (vomiting).  You get pain in a different part of the area between your belly and thighs (pelvis).  You have bad-smelling discharge from your vagina.  You have a rash. Get help right away if:  You are bleeding a lot from your vagina. A lot of bleeding means soaking more than  one sanitary pad in an hour, for 2 hours in a row.  You have clumps of blood (blood clots) coming from your vagina.  You have a fever or chills.  Your belly feels very tender or hard.  You have chest pain.  You have trouble breathing.  You cough up blood.  You feel dizzy.  You feel light-headed.  You pass out (faint).  You have pain in your neck or shoulder area. Summary  Take short walks often, followed by rest periods. Ask your doctor what activities are safe for you. After one or two days, you may be able to return to your normal activities.  Do not lift anything that is heavier than 10 lb (4.5 kg) until your doctor approves.  Do not take baths, swim, or use a hot tub until your doctor approves. Take showers instead of baths.   Contact your doctor if you have any symptoms of infection, like bad-smelling discharge from your vagina. This information is not intended to replace advice given to you by your health care provider. Make sure you discuss any questions you have with your health care provider. Document Released: 09/03/2008 Document Revised: 11/07/2017 Document Reviewed: 08/12/2016 Elsevier Patient Education  2020 [employer]  Endometrial Ablation Endometrial ablation is a procedure that destroys the thin inner layer of the lining of the uterus (endometrium). This procedure may be done:  To stop heavy periods.  To stop bleeding that is causing anemia.  To control irregular bleeding.  To treat bleeding caused by small tumors (fibroids) in the endometrium. This procedure is often an alternative to major surgery, such as removal of the uterus and cervix (hysterectomy). As a result of this procedure:  You may not be able to have children. However, if you are premenopausal (you have not gone through menopause): ? You may still have a small chance of getting pregnant. ? You will need to use a reliable method of birth control after the procedure to prevent pregnancy.  You may stop having a menstrual period, or you may have only a small amount of bleeding during your period. Menstruation may return several years after the procedure. Tell a health care provider about:  Any allergies you have.  All medicines you are taking, including vitamins, herbs, eye drops, creams, and over-the-counter medicines.  Any problems you or family members have had with the use of anesthetic medicines.  Any blood disorders you have.  Any surgeries you have had.  Any medical conditions you have. What are the risks? Generally, this is a safe procedure. However, problems may occur, including:  A hole (perforation) in the uterus or bowel.  Infection of the uterus, bladder, or vagina.  Bleeding.  Damage to other  structures or organs.  An air bubble in the lung (air embolus).  Problems with pregnancy after the procedure.  Failure of the procedure.  Decreased ability to diagnose cancer in the endometrium. What happens before the procedure?  You will have tests of your endometrium to make sure there are no pre-cancerous cells or cancer cells present.  You may have an ultrasound of the uterus.  You may be given medicines to thin the endometrium.  Ask your health care provider about: ? Changing or stopping your regular medicines. This is especially important if you take diabetes medicines or blood thinners. ? Taking medicines such as aspirin and ibuprofen. These medicines can thin your blood. Do not take these medicines before your procedure if your doctor tells you not to.  Plan to have someone take you home from the hospital or clinic. What happens during the procedure?   You will lie on an exam table with your feet and legs supported as in a pelvic exam.  To lower your risk of infection: ? Your health care team will wash or sanitize their hands and put on germ-free (sterile) gloves. ? Your genital area will be washed with soap.  An IV tube will be inserted into one of your veins.  You will be given a medicine to help you relax (sedative).  A surgical instrument with a light and camera (resectoscope) will be inserted into your vagina and moved into your uterus. This allows your surgeon to see inside your uterus.  Endometrial tissue will be removed using one of the following methods: ? Radiofrequency. This method uses a radiofrequency-alternating electric current to remove the endometrium. ? Cryotherapy. This method uses extreme cold to freeze the endometrium. ? Heated-free liquid. This method uses a heated saltwater (saline) solution to remove the endometrium. ? Microwave. This method uses high-energy microwaves to heat up the endometrium and remove it. ? Thermal balloon. This method  involves inserting a catheter with a balloon tip into the uterus. The balloon tip is filled with heated fluid to remove the endometrium. The procedure may vary among health care providers and hospitals. What happens after the procedure?  Your blood pressure, heart rate, breathing rate, and blood oxygen level will be monitored until the medicines you were given have worn off.  As tissue healing occurs, you may notice vaginal bleeding for 4-6 weeks after the procedure. You may also experience: ? Cramps. ? Thin, watery vaginal discharge that is light pink or brown in color. ? A need to urinate more frequently than usual. ? Nausea.  Do not drive for 24 hours if you were given a sedative.  Do not have sex or insert anything into your vagina until your health care provider approves. Summary  Endometrial ablation is done to treat the many causes of heavy menstrual bleeding.  The procedure may be done only after medications have been tried to control the bleeding.  Plan to have someone take you home from the hospital or clinic. This information is not intended to replace advice given to you by your health care provider. Make sure you discuss any questions you have with your health care provider. Document Released: 10/04/2004 Document Revised: 05/12/2018 Document Reviewed: 12/12/2016 Elsevier Patient Education  2020 Else[employer] Inc.  Hysteroscopy, Care After This sheet gives you information about how to care for yourself after your procedure. Your health care provider may also give you more specific instructions. If you have problems or questions, contact your health care provider. What can I expect after the procedure? After the procedure, it is common to have:  Cramping.  Bleeding. This can vary from light spotting to menstrual-like bleeding. Follow these instructions at home: Activity  Rest for 1-2 days after the procedure.  Do not douche, use tampons, or have sex for 2 weeks after the  procedure, or until your health care provider approves.  Do not drive for 24 hours after the procedure, or for as long as told by your health care provider.  Do not drive, use heavy machinery, or drink alcohol while taking prescription pain medicines. Medicines   Take over-the-counter and prescription medicines only as told by your health care provider.  Do not take aspirin during recovery. It can increase the risk of bleeding. General instructions  Do not take  baths, swim, or use a hot tub until your health care provider approves. Take showers instead of baths for 2 weeks, or for as long as told by your health care provider.  To prevent or treat constipation while you are taking prescription pain medicine, your health care provider may recommend that you: ? Drink enough fluid to keep your urine clear or pale yellow. ? Take over-the-counter or prescription medicines. ? Eat foods that are high in fiber, such as fresh fruits and vegetables, whole grains, and beans. ? Limit foods that are high in fat and processed sugars, such as fried and sweet foods.  Keep all follow-up visits as told by your health care provider. This is important. Contact a health care provider if:  You feel dizzy or lightheaded.  You feel nauseous.  You have abnormal vaginal discharge.  You have a rash.  You have pain that does not get better with medicine.  You have chills. Get help right away if:  You have bleeding that is heavier than a normal menstrual period.  You have a fever.  You have pain or cramps that get worse.  You develop new abdominal pain.  You faint.  You have pain in your shoulders.  You have shortness of breath. Summary  After the procedure, you may have cramping and some vaginal bleeding.  Do not douche, use tampons, or have sex for 2 weeks after the procedure, or until your health care provider approves.  Do not take baths, swim, or use a hot tub until your health care  provider approves. Take showers instead of baths for 2 weeks, or for as long as told by your health care provider.  Report any unusual symptoms to your health care provider.  Keep all follow-up visits as told by your health care provider. This is important. This information is not intended to replace advice given to you by your health care provider. Make sure you discuss any questions you have with your health care provider. Document Released: 09/15/2013 Document Revised: 11/07/2017 Document Reviewed: 12/24/2016 Elsevier Patient Education  2020 Else[employer] Inc.  General Anesthesia, Adult, Care After This sheet gives you information about how to care for yourself after your procedure. Your health care provider may also give you more specific instructions. If you have problems or questions, contact your health care provider. What can I expect after the procedure? After the procedure, the following side effects are common:  Pain or discomfort at the IV site.  Nausea.  Vomiting.  Sore throat.  Trouble concentrating.  Feeling cold or chills.  Weak or tired.  Sleepiness and fatigue.  Soreness and body aches. These side effects can affect parts of the body that were not involved in surgery. Follow these instructions at home:  For at least 24 hours after the procedure:  Have a responsible adult stay with you. It is important to have someone help care for you until you are awake and alert.  Rest as needed.  Do not: ? Participate in activities in which you could fall or become injured. ? Drive. ? Use heavy machinery. ? Drink alcohol. ? Take sleeping pills or medicines that cause drowsiness. ? Make important decisions or sign legal documents. ? Take care of children on your own. Eating and drinking  Follow any instructions from your health care provider about eating or drinking restrictions.  When you feel hungry, start by eating small amounts of foods that are soft and easy to  digest (bland), such as toast. Gradually  return to your regular diet.  Drink enough fluid to keep your urine pale yellow.  If you vomit, rehydrate by drinking water, juice, or clear broth. General instructions  If you have sleep apnea, surgery and certain medicines can increase your risk for breathing problems. Follow instructions from your health care provider about wearing your sleep device: ? Anytime you are sleeping, including during daytime naps. ? While taking prescription pain medicines, sleeping medicines, or medicines that make you drowsy.  Return to your normal activities as told by your health care provider. Ask your health care provider what activities are safe for you.  Take over-the-counter and prescription medicines only as told by your health care provider.  If you smoke, do not smoke without supervision.  Keep all follow-up visits as told by your health care provider. This is important. Contact a health care provider if:  You have nausea or vomiting that does not get better with medicine.  You cannot eat or drink without vomiting.  You have pain that does not get better with medicine.  You are unable to pass urine.  You develop a skin rash.  You have a fever.  You have redness around your IV site that gets worse. Get help right away if:  You have difficulty breathing.  You have chest pain.  You have blood in your urine or stool, or you vomit blood. Summary  After the procedure, it is common to have a sore throat or nausea. It is also common to feel tired.  Have a responsible adult stay with you for the first 24 hours after general anesthesia. It is important to have someone help care for you until you are awake and alert.  When you feel hungry, start by eating small amounts of foods that are soft and easy to digest (bland), such as toast. Gradually return to your regular diet.  Drink enough fluid to keep your urine pale yellow.  Return to your normal  activities as told by your health care provider. Ask your health care provider what activities are safe for you. This information is not intended to replace advice given to you by your health care provider. Make sure you discuss any questions you have with your health care provider. Document Released: 03/03/2001 Document Revised: 11/28/2017 Document Reviewed: 07/11/2017 Elsevier Patient Education  2020 [employer]. How to Use Chlorhexidine for Bathing Chlorhexidine gluconate (CHG) is a germ-killing (antiseptic) solution that is used to clean the skin. It can get rid of the bacteria that normally live on the skin and can keep them away for about 24 hours. To clean your skin with CHG, you may be given:  A CHG solution to use in the shower or as part of a sponge bath.  A prepackaged cloth that contains CHG. Cleaning your skin with CHG may help lower the risk for infection:  While you are staying in the intensive care unit of the hospital.  If you have a vascular access, such as a central line, to provide short-term or long-term access to your veins.  If you have a catheter to drain urine from your bladder.  If you are on a ventilator. A ventilator is a machine that helps you breathe by moving air in and out of your lungs.  After surgery. What are the risks? Risks of using CHG include:  A skin reaction.  Hearing loss, if CHG gets in your ears.  Eye injury, if CHG gets in your eyes and is not rinsed out.  The CHG product catching fire. Make sure that you avoid smoking and flames after applying CHG to your skin. Do not use CHG:  If you have a chlorhexidine allergy or have previously reacted to chlorhexidine.  On babies younger than 66 months of age. How to use CHG solution  Use CHG only as told by your health care provider, and follow the instructions on the label.  Use the full amount of CHG as directed. Usually, this is one bottle. During a shower Follow these steps when using  CHG solution during a shower (unless your health care provider gives you different instructions): 1. Start the shower. 2. Use your normal soap and shampoo to wash your face and hair. 3. Turn off the shower or move out of the shower stream. 4. Pour the CHG onto a clean washcloth. Do not use any type of brush or rough-edged sponge. 5. Starting at your neck, lather your body down to your toes. Make sure you follow these instructions: ? If you will be having surgery, pay special attention to the part of your body where you will be having surgery. Scrub this area for at least 1 minute. ? Do not use CHG on your head or face. If the solution gets into your ears or eyes, rinse them well with water. ? Avoid your genital area. ? Avoid any areas of skin that have broken skin, cuts, or scrapes. ? Scrub your back and under your arms. Make sure to wash skin folds. 6. Let the lather sit on your skin for 1-2 minutes or as long as told by your health care provider. 7. Thoroughly rinse your entire body in the shower. Make sure that all body creases and crevices are rinsed well. 8. Dry off with a clean towel. Do not put any substances on your body afterward-such as powder, lotion, or perfume-unless you are told to do so by your health care provider. Only use lotions that are recommended by the manufacturer. 9. Put on clean clothes or pajamas. 10. If it is the night before your surgery, sleep in clean sheets.  During a sponge bath Follow these steps when using CHG solution during a sponge bath (unless your health care provider gives you different instructions): 1. Use your normal soap and shampoo to wash your face and hair. 2. Pour the CHG onto a clean washcloth. 3. Starting at your neck, lather your body down to your toes. Make sure you follow these instructions: ? If you will be having surgery, pay special attention to the part of your body where you will be having surgery. Scrub this area for at least 1 minute.  ? Do not use CHG on your head or face. If the solution gets into your ears or eyes, rinse them well with water. ? Avoid your genital area. ? Avoid any areas of skin that have broken skin, cuts, or scrapes. ? Scrub your back and under your arms. Make sure to wash skin folds. 4. Let the lather sit on your skin for 1-2 minutes or as long as told by your health care provider. 5. Using a different clean, wet washcloth, thoroughly rinse your entire body. Make sure that all body creases and crevices are rinsed well. 6. Dry off with a clean towel. Do not put any substances on your body afterward-such as powder, lotion, or perfume-unless you are told to do so by your health care provider. Only use lotions that are recommended by the manufacturer. 7. Put on clean clothes or  pajamas. 8. If it is the night before your surgery, sleep in clean sheets. How to use CHG prepackaged cloths  Only use CHG cloths as told by your health care provider, and follow the instructions on the label.  Use the CHG cloth on clean, dry skin.  Do not use the CHG cloth on your head or face unless your health care provider tells you to.  When washing with the CHG cloth: ? Avoid your genital area. ? Avoid any areas of skin that have broken skin, cuts, or scrapes. Before surgery Follow these steps when using a CHG cloth to clean before surgery (unless your health care provider gives you different instructions): 1. Using the CHG cloth, vigorously scrub the part of your body where you will be having surgery. Scrub using a back-and-forth motion for 3 minutes. The area on your body should be completely wet with CHG when you are done scrubbing. 2. Do not rinse. Discard the cloth and let the area air-dry. Do not put any substances on the area afterward, such as powder, lotion, or perfume. 3. Put on clean clothes or pajamas. 4. If it is the night before your surgery, sleep in clean sheets.  For general bathing Follow these steps  when using CHG cloths for general bathing (unless your health care provider gives you different instructions). 1. Use a separate CHG cloth for each area of your body. Make sure you wash between any folds of skin and between your fingers and toes. Wash your body in the following order, switching to a new cloth after each step: ? The front of your neck, shoulders, and chest. ? Both of your arms, under your arms, and your hands. ? Your stomach and groin area, avoiding the genitals. ? Your right leg and foot. ? Your left leg and foot. ? The back of your neck, your back, and your buttocks. 2. Do not rinse. Discard the cloth and let the area air-dry. Do not put any substances on your body afterward-such as powder, lotion, or perfume-unless you are told to do so by your health care provider. Only use lotions that are recommended by the manufacturer. 3. Put on clean clothes or pajamas. Contact a health care provider if:  Your skin gets irritated after scrubbing.  You have questions about using your solution or cloth. Get help right away if:  Your eyes become very red or swollen.  Your eyes itch badly.  Your skin itches badly and is red or swollen.  Your hearing changes.  You have trouble seeing.  You have swelling or tingling in your mouth or throat.  You have trouble breathing.  You swallow any chlorhexidine. Summary  Chlorhexidine gluconate (CHG) is a germ-killing (antiseptic) solution that is used to clean the skin. Cleaning your skin with CHG may help to lower your risk for infection.  You may be given CHG to use for bathing. It may be in a bottle or in a prepackaged cloth to use on your skin. Carefully follow your health care provider's instructions and the instructions on the product label.  Do not use CHG if you have a chlorhexidine allergy.  Contact your health care provider if your skin gets irritated after scrubbing. This information is not intended to replace advice given  to you by your health care provider. Make sure you discuss any questions you have with your health care provider. Document Released: 08/19/2012 Document Revised: 02/11/2019 Document Reviewed: 10/23/2017 Elsevier Patient Education  2020 [employer].

## 2019-08-27 ENCOUNTER — Encounter (HOSPITAL_COMMUNITY): Payer: Self-pay

## 2019-08-27 ENCOUNTER — Encounter (HOSPITAL_COMMUNITY)
Admission: RE | Admit: 2019-08-27 | Discharge: 2019-08-27 | Disposition: A | Discharge status: Home / Self Care | Payer: Medicaid Other | Source: Ambulatory Visit | Attending: Obstetrics and Gynecology | Admitting: Obstetrics and Gynecology

## 2019-08-27 ENCOUNTER — Other Ambulatory Visit (HOSPITAL_COMMUNITY)
Admission: RE | Admit: 2019-08-27 | Discharge: 2019-08-27 | Disposition: A | Discharge status: Home / Self Care | Payer: Medicaid Other | Source: Ambulatory Visit | Attending: Obstetrics and Gynecology | Admitting: Obstetrics and Gynecology

## 2019-08-27 ENCOUNTER — Other Ambulatory Visit: Payer: Self-pay

## 2019-08-27 DIAGNOSIS — Z01812 Encounter for preprocedural laboratory examination: Secondary | ICD-10-CM | POA: Insufficient documentation

## 2019-08-27 HISTORY — DX: Family history of other specified conditions: Z84.89

## 2019-08-27 LAB — TYPE AND SCREEN
ABO/RH(D): O NEG
Antibody Screen: NEGATIVE

## 2019-08-27 LAB — COMPREHENSIVE METABOLIC PANEL
ALT: 15 U/L (ref 0–44)
AST: 18 U/L (ref 15–41)
Albumin: 4.2 g/dL (ref 3.5–5.0)
Alkaline Phosphatase: 66 U/L (ref 38–126)
Anion gap: 9 (ref 5–15)
BUN: 14 mg/dL (ref 6–20)
CO2: 20 mmol/L — ABNORMAL LOW (ref 22–32)
Calcium: 8.9 mg/dL (ref 8.9–10.3)
Chloride: 108 mmol/L (ref 98–111)
Creatinine, Ser: 0.79 mg/dL (ref 0.44–1.00)
GFR calc Af Amer: >60 mL/min (ref >60)
GFR calc non Af Amer: >60 mL/min (ref >60)
Glucose, Bld: 85 mg/dL (ref 70–99)
Potassium: 3.8 mmol/L (ref 3.5–5.1)
Sodium: 137 mmol/L (ref 135–145)
Total Bilirubin: 0.7 mg/dL (ref 0.3–1.2)
Total Protein: 6.9 g/dL (ref 6.5–8.1)

## 2019-08-27 LAB — CBC
HCT: 43.3 % (ref 36.0–46.0)
Hemoglobin: 13.8 g/dL (ref 12.0–15.0)
MCH: 31.6 pg (ref 26.0–34.0)
MCHC: 31.9 g/dL (ref 30.0–36.0)
MCV: 99.1 fL (ref 80.0–100.0)
Platelets: 268 10*3/uL (ref 150–400)
RBC: 4.37 MIL/uL (ref 3.87–5.11)
RDW: 12.9 % (ref 11.5–15.5)
WBC: 9.4 10*3/uL (ref 4.0–10.5)
nRBC: 0 % (ref 0.0–0.2)

## 2019-08-27 LAB — HCG, QUANTITATIVE, PREGNANCY: hCG, Beta Chain, Quant, S: <1 m[IU]/mL (ref <5)

## 2019-08-27 LAB — URINALYSIS, ROUTINE W REFLEX MICROSCOPIC
Bilirubin Urine: NEGATIVE
Glucose, UA: NEGATIVE mg/dL
Hgb urine dipstick: NEGATIVE
Ketones, ur: NEGATIVE mg/dL
Leukocytes,Ua: NEGATIVE
Nitrite: NEGATIVE
Protein, ur: NEGATIVE mg/dL
Specific Gravity, Urine: 1.008 (ref 1.005–1.030)
pH: 6 (ref 5.0–8.0)

## 2019-08-27 LAB — SARS CORONAVIRUS 2 (TAT 6-24 HRS): SARS Coronavirus 2: NEGATIVE

## 2019-08-31 ENCOUNTER — Other Ambulatory Visit: Payer: Self-pay

## 2019-08-31 ENCOUNTER — Ambulatory Visit (HOSPITAL_COMMUNITY)
Admission: RE | Admit: 2019-08-31 | Discharge: 2019-08-31 | Disposition: A | Discharge status: Home / Self Care | Payer: Medicaid Other | Attending: Obstetrics and Gynecology | Admitting: Obstetrics and Gynecology

## 2019-08-31 ENCOUNTER — Ambulatory Visit (HOSPITAL_COMMUNITY): Payer: Medicaid Other | Admitting: Anesthesiology

## 2019-08-31 ENCOUNTER — Encounter (HOSPITAL_COMMUNITY)
Admission: RE | Disposition: A | Discharge status: Home / Self Care | Payer: Self-pay | Source: Home / Self Care | Attending: Obstetrics and Gynecology

## 2019-08-31 ENCOUNTER — Encounter (HOSPITAL_COMMUNITY): Payer: Self-pay | Admitting: *Deleted

## 2019-08-31 DIAGNOSIS — Z7951 Long term (current) use of inhaled steroids: Secondary | ICD-10-CM | POA: Insufficient documentation

## 2019-08-31 DIAGNOSIS — Z8489 Family history of other specified conditions: Secondary | ICD-10-CM | POA: Insufficient documentation

## 2019-08-31 DIAGNOSIS — Z841 Family history of disorders of kidney and ureter: Secondary | ICD-10-CM | POA: Insufficient documentation

## 2019-08-31 DIAGNOSIS — E785 Hyperlipidemia, unspecified: Secondary | ICD-10-CM | POA: Insufficient documentation

## 2019-08-31 DIAGNOSIS — Z9049 Acquired absence of other specified parts of digestive tract: Secondary | ICD-10-CM | POA: Insufficient documentation

## 2019-08-31 DIAGNOSIS — Z6841 Body Mass Index (BMI) 40.0 and over, adult: Secondary | ICD-10-CM | POA: Insufficient documentation

## 2019-08-31 DIAGNOSIS — Z79899 Other long term (current) drug therapy: Secondary | ICD-10-CM | POA: Diagnosis not present

## 2019-08-31 DIAGNOSIS — J45909 Unspecified asthma, uncomplicated: Secondary | ICD-10-CM | POA: Diagnosis not present

## 2019-08-31 DIAGNOSIS — Z818 Family history of other mental and behavioral disorders: Secondary | ICD-10-CM | POA: Diagnosis not present

## 2019-08-31 DIAGNOSIS — F319 Bipolar disorder, unspecified: Secondary | ICD-10-CM | POA: Diagnosis not present

## 2019-08-31 DIAGNOSIS — E669 Obesity, unspecified: Secondary | ICD-10-CM | POA: Insufficient documentation

## 2019-08-31 DIAGNOSIS — Z888 Allergy status to other drugs, medicaments and biological substances status: Secondary | ICD-10-CM | POA: Diagnosis not present

## 2019-08-31 DIAGNOSIS — F419 Anxiety disorder, unspecified: Secondary | ICD-10-CM | POA: Insufficient documentation

## 2019-08-31 DIAGNOSIS — G709 Myoneural disorder, unspecified: Secondary | ICD-10-CM | POA: Insufficient documentation

## 2019-08-31 DIAGNOSIS — Z825 Family history of asthma and other chronic lower respiratory diseases: Secondary | ICD-10-CM | POA: Insufficient documentation

## 2019-08-31 DIAGNOSIS — I10 Essential (primary) hypertension: Secondary | ICD-10-CM | POA: Insufficient documentation

## 2019-08-31 DIAGNOSIS — K219 Gastro-esophageal reflux disease without esophagitis: Secondary | ICD-10-CM | POA: Diagnosis not present

## 2019-08-31 DIAGNOSIS — G43909 Migraine, unspecified, not intractable, without status migrainosus: Secondary | ICD-10-CM | POA: Insufficient documentation

## 2019-08-31 DIAGNOSIS — Z803 Family history of malignant neoplasm of breast: Secondary | ICD-10-CM | POA: Insufficient documentation

## 2019-08-31 DIAGNOSIS — Z820 Family history of epilepsy and other diseases of the nervous system: Secondary | ICD-10-CM | POA: Insufficient documentation

## 2019-08-31 DIAGNOSIS — K449 Diaphragmatic hernia without obstruction or gangrene: Secondary | ICD-10-CM | POA: Diagnosis not present

## 2019-08-31 DIAGNOSIS — Z8719 Personal history of other diseases of the digestive system: Secondary | ICD-10-CM | POA: Diagnosis not present

## 2019-08-31 DIAGNOSIS — N920 Excessive and frequent menstruation with regular cycle: Secondary | ICD-10-CM | POA: Insufficient documentation

## 2019-08-31 DIAGNOSIS — Z8249 Family history of ischemic heart disease and other diseases of the circulatory system: Secondary | ICD-10-CM | POA: Insufficient documentation

## 2019-08-31 DIAGNOSIS — Z833 Family history of diabetes mellitus: Secondary | ICD-10-CM | POA: Insufficient documentation

## 2019-08-31 DIAGNOSIS — N946 Dysmenorrhea, unspecified: Secondary | ICD-10-CM | POA: Diagnosis not present

## 2019-08-31 DIAGNOSIS — Z791 Long term (current) use of non-steroidal anti-inflammatories (NSAID): Secondary | ICD-10-CM | POA: Diagnosis not present

## 2019-08-31 DIAGNOSIS — Z91040 Latex allergy status: Secondary | ICD-10-CM | POA: Insufficient documentation

## 2019-08-31 DIAGNOSIS — F17210 Nicotine dependence, cigarettes, uncomplicated: Secondary | ICD-10-CM | POA: Insufficient documentation

## 2019-08-31 HISTORY — PX: DILATATION AND CURETTAGE/HYSTEROSCOPY WITH MINERVA: SHX6851

## 2019-08-31 SURGERY — DILATATION AND CURETTAGE/HYSTEROSCOPY WITH MINERVA
Anesthesia: General

## 2019-08-31 MED ORDER — ROCURONIUM BROMIDE 10 MG/ML (PF) SYRINGE
PREFILLED_SYRINGE | INTRAVENOUS | Status: AC
  Filled 2019-08-31: qty 10

## 2019-08-31 MED ORDER — SUGAMMADEX SODIUM 200 MG/2ML IV SOLN
INTRAVENOUS | Status: DC | PRN
  Administered 2019-08-31: 350 mg via INTRAVENOUS

## 2019-08-31 MED ORDER — SUCCINYLCHOLINE CHLORIDE 200 MG/10ML IV SOSY
PREFILLED_SYRINGE | INTRAVENOUS | Status: AC
  Filled 2019-08-31: qty 10

## 2019-08-31 MED ORDER — PROPOFOL 10 MG/ML IV BOLUS
INTRAVENOUS | Status: AC
  Filled 2019-08-31: qty 40

## 2019-08-31 MED ORDER — FENTANYL CITRATE (PF) 250 MCG/5ML IJ SOLN
INTRAMUSCULAR | Status: AC
  Filled 2019-08-31: qty 5

## 2019-08-31 MED ORDER — ONDANSETRON HCL 4 MG/2ML IJ SOLN
INTRAMUSCULAR | Status: DC | PRN
  Administered 2019-08-31: 4 mg via INTRAVENOUS

## 2019-08-31 MED ORDER — 0.9 % SODIUM CHLORIDE (POUR BTL) OPTIME
TOPICAL | Status: DC | PRN
  Administered 2019-08-31: 08:00:00 1000 mL

## 2019-08-31 MED ORDER — PROPOFOL 10 MG/ML IV BOLUS
INTRAVENOUS | Status: DC | PRN
  Administered 2019-08-31: 200 mg via INTRAVENOUS

## 2019-08-31 MED ORDER — LACTATED RINGERS IV SOLN
INTRAVENOUS | Status: DC
  Administered 2019-08-31: 07:00:00 via INTRAVENOUS

## 2019-08-31 MED ORDER — CEFAZOLIN SODIUM-DEXTROSE 2-4 GM/100ML-% IV SOLN
2.0000 g | INTRAVENOUS | Status: AC
  Administered 2019-08-31: 08:00:00 2 g via INTRAVENOUS

## 2019-08-31 MED ORDER — LIDOCAINE 2% (20 MG/ML) 5 ML SYRINGE
INTRAMUSCULAR | Status: AC
  Filled 2019-08-31: qty 5

## 2019-08-31 MED ORDER — MIDAZOLAM HCL 2 MG/2ML IJ SOLN
INTRAMUSCULAR | Status: AC
  Filled 2019-08-31: qty 2

## 2019-08-31 MED ORDER — ROCURONIUM 10MG/ML (10ML) SYRINGE FOR MEDFUSION PUMP - OPTIME
INTRAVENOUS | Status: DC | PRN
  Administered 2019-08-31: 30 mg via INTRAVENOUS

## 2019-08-31 MED ORDER — SEVOFLURANE IN SOLN
RESPIRATORY_TRACT | Status: AC
  Filled 2019-08-31: qty 250

## 2019-08-31 MED ORDER — KETOROLAC TROMETHAMINE 10 MG PO TABS: 10.0000 mg | ORAL_TABLET | Freq: Four times a day (QID) | ORAL | 0 refills | Status: DC

## 2019-08-31 MED ORDER — BUPIVACAINE-EPINEPHRINE (PF) 0.5% -1:200000 IJ SOLN
INTRAMUSCULAR | Status: AC
  Filled 2019-08-31: qty 30

## 2019-08-31 MED ORDER — HYDROCODONE-ACETAMINOPHEN 7.5-325 MG PO TABS: 1.0000 | ORAL_TABLET | Freq: Once | ORAL | Status: DC | PRN

## 2019-08-31 MED ORDER — EPHEDRINE 5 MG/ML INJ
INTRAVENOUS | Status: AC
  Filled 2019-08-31: qty 10

## 2019-08-31 MED ORDER — FENTANYL CITRATE (PF) 100 MCG/2ML IJ SOLN
INTRAMUSCULAR | Status: DC | PRN
  Administered 2019-08-31: 5 ug via INTRAVENOUS
  Administered 2019-08-31: 50 ug via INTRAVENOUS

## 2019-08-31 MED ORDER — HYDROCODONE-ACETAMINOPHEN 5-325 MG PO TABS: 1.0000 | ORAL_TABLET | Freq: Four times a day (QID) | ORAL | 0 refills | Status: DC | PRN

## 2019-08-31 MED ORDER — PROMETHAZINE HCL 25 MG/ML IJ SOLN: 6.2500 mg | INTRAMUSCULAR | Status: DC | PRN

## 2019-08-31 MED ORDER — SUCCINYLCHOLINE 20MG/ML (10ML) SYRINGE FOR MEDFUSION PUMP - OPTIME
INTRAMUSCULAR | Status: DC | PRN
  Administered 2019-08-31: 120 mg via INTRAVENOUS

## 2019-08-31 MED ORDER — BUPIVACAINE-EPINEPHRINE (PF) 0.5% -1:200000 IJ SOLN
INTRAMUSCULAR | Status: DC | PRN
  Administered 2019-08-31: 20 mL

## 2019-08-31 MED ORDER — HYDROMORPHONE HCL 1 MG/ML IJ SOLN
0.2500 mg | INTRAMUSCULAR | Status: DC | PRN
  Administered 2019-08-31 (×2): 0.5 mg via INTRAVENOUS
  Filled 2019-08-31 (×2): qty 0.5

## 2019-08-31 MED ORDER — SODIUM CHLORIDE 0.9 % IR SOLN
Status: DC | PRN
  Administered 2019-08-31: 3000 mL

## 2019-08-31 MED ORDER — ALBUTEROL SULFATE HFA 108 (90 BASE) MCG/ACT IN AERS
INHALATION_SPRAY | RESPIRATORY_TRACT | Status: DC | PRN
  Administered 2019-08-31 (×2): 2 via RESPIRATORY_TRACT

## 2019-08-31 MED ORDER — CEFAZOLIN SODIUM-DEXTROSE 2-4 GM/100ML-% IV SOLN
INTRAVENOUS | Status: AC
  Filled 2019-08-31: qty 100

## 2019-08-31 MED ORDER — MIDAZOLAM HCL 2 MG/2ML IJ SOLN: 0.5000 mg | Freq: Once | INTRAMUSCULAR | Status: DC | PRN

## 2019-08-31 MED ORDER — ONDANSETRON HCL 4 MG/2ML IJ SOLN
INTRAMUSCULAR | Status: AC
  Filled 2019-08-31: qty 4

## 2019-08-31 SURGICAL SUPPLY — 25 items
CLOTH BEACON ORANGE TIMEOUT ST (SAFETY) ×3 IMPLANT
COVER LIGHT HANDLE STERIS (MISCELLANEOUS) ×6 IMPLANT
COVER WAND RF STERILE (DRAPES) ×3 IMPLANT
DECANTER SPIKE VIAL GLASS SM (MISCELLANEOUS) ×3 IMPLANT
GAUZE SPONGE 4X4 16PLY XRAY LF (GAUZE/BANDAGES/DRESSINGS) ×3 IMPLANT
GLOVE BIOGEL PI IND STRL 7.0 (GLOVE) ×2 IMPLANT
GLOVE BIOGEL PI IND STRL 9 (GLOVE) ×1 IMPLANT
GLOVE BIOGEL PI INDICATOR 7.0 (GLOVE) ×4
GLOVE BIOGEL PI INDICATOR 9 (GLOVE) ×2
GLOVE SS PI 9.0 STRL (GLOVE) ×3 IMPLANT
GLOVE SURG SS PI 6.5 STRL IVOR (GLOVE) ×3 IMPLANT
GOWN SPEC L3 XXLG W/TWL (GOWN DISPOSABLE) ×3 IMPLANT
GOWN STRL REUS W/TWL LRG LVL3 (GOWN DISPOSABLE) ×3 IMPLANT
HANDPIECE ABLA MINERVA ENDO (MISCELLANEOUS) ×3 IMPLANT
INST SET HYSTEROSCOPY (KITS) ×3 IMPLANT
IV NS IRRIG 3000ML ARTHROMATIC (IV SOLUTION) ×3 IMPLANT
KIT TURNOVER CYSTO (KITS) ×3 IMPLANT
MANIFOLD NEPTUNE II (INSTRUMENTS) ×3 IMPLANT
NS IRRIG 1000ML POUR BTL (IV SOLUTION) ×3 IMPLANT
PACK PERI GYN (CUSTOM PROCEDURE TRAY) ×3 IMPLANT
PAD ARMBOARD 7.5X6 YLW CONV (MISCELLANEOUS) ×3 IMPLANT
PAD TELFA 3X4 1S STER (GAUZE/BANDAGES/DRESSINGS) ×3 IMPLANT
SET BASIN LINEN APH (SET/KITS/TRAYS/PACK) ×3 IMPLANT
SET IRRIG Y TYPE TUR BLADDER L (SET/KITS/TRAYS/PACK) ×3 IMPLANT
SYR CONTROL 10ML LL (SYRINGE) ×3 IMPLANT

## 2019-08-31 NOTE — Anesthesia Preprocedure Evaluation (Signed)
Anesthesia Evaluation  Patient identified by MRN, date of birth, ID band Patient awake  General Assessment Comment:States GM was slow to awaken - She hasn't ever had issues  Reviewed: Allergy & Precautions, NPO status , Patient's Chart, lab work & pertinent test results  History of Anesthesia Complications (+) Family history of anesthesia reaction  Airway Mallampati: II  TM Distance: >3 FB Neck ROM: Full    Dental no notable dental hx. (+) Teeth Intact   Pulmonary asthma , Current Smoker and Patient abstained from smoking.,    Pulmonary exam normal breath sounds clear to auscultation       Cardiovascular Exercise Tolerance: Good hypertension, Pt. on medications negative cardio ROS Normal cardiovascular examI Rhythm:Regular Rate:Normal     Neuro/Psych  Headaches, Anxiety Depression Bipolar Disorder  Neuromuscular disease negative psych ROS   GI/Hepatic Neg liver ROS, hiatal hernia, GERD  Medicated and Controlled,  Endo/Other  negative endocrine ROS  Renal/GU negative Renal ROS  negative genitourinary   Musculoskeletal negative musculoskeletal ROS (+)   Abdominal   Peds negative pediatric ROS (+)  Hematology negative hematology ROS (+)   Anesthesia Other Findings   Reproductive/Obstetrics negative OB ROS                             Anesthesia Physical Anesthesia Plan  ASA: II  Anesthesia Plan: General   Post-op Pain Management:    Induction: Intravenous  PONV Risk Score and Plan: 2 and Ondansetron, Dexamethasone, Treatment may vary due to age or medical condition and Midazolam  Airway Management Planned: LMA and Oral ETT  Additional Equipment:   Intra-op Plan:   Post-operative Plan: Extubation in OR  Informed Consent: I have reviewed the patients History and Physical, chart, labs and discussed the procedure including the risks, benefits and alternatives for the proposed  anesthesia with the patient or authorized representative who has indicated his/her understanding and acceptance.     Dental advisory given  Plan Discussed with: CRNA  Anesthesia Plan Comments: (Plan Full PPE use  Plan GA (LMA) vs  GETA as needed d/w pt -WTP with same after Q&A)        Anesthesia Quick Evaluation

## 2019-08-31 NOTE — Interval H&P Note (Signed)
History and Physical Interval Note:  08/31/2019 7:03 AM  Destiny Petty  has presented today for surgery, with the diagnosis of Menorrhagia Dysmenorrhea.  The various methods of treatment have been discussed with the patient and family. After consideration of risks, benefits and other options for treatment, the patient has consented to  Procedure(s): DILATATION AND CURETTAGE/HYSTEROSCOPY WITH MINERVA (N/A) as a surgical intervention.  The patient's history has been reviewed, patient examined, no change in status, stable for surgery.  I have reviewed the patient's chart and labs.  Questions were answered to the patient's satisfaction.  She has been on Megace since the summer, and has gained 15 pounds in 4 months.   Jonnie Kind

## 2019-08-31 NOTE — H&P (Signed)
Patient ID: Destiny Petty, female   DOB: 1981-09-02, 38 y.o.   MRN: [id number] Preoperative History and Physical  Destiny Petty is a 38 y.o. [id number] here for surgical management of AUB.   No significant preoperative concerns. Will repeat pap with GC / CHL and  endometrial biopsy has been done and is benign.  Proposed surgery: Hysteroscopy, Dilation and Curettage, Endometrial ablation with Minerva.      Past Medical History:  Diagnosis Date  . Allergy    SEASONAL  . 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 Anxiety   . Asthma   . Back pain with right-sided sciatica 06/18/2016  . Body mass index 36.0-36.9, adult 04/10/2016  . Body mass index 37.0-37.9, adult 03/13/2016  . Depression   . Dysmenorrhea 04/10/2016  . GERD (gastroesophageal reflux disease)   . Hemorrhoids 02/10/2015  . Hiatal hernia   . Hyperlipidemia 04/09/2011  . Hypertension   . Menorrhagia with regular cycle 04/10/2016  . Muscle spasm of back 03/13/2016  . Obesity   . Ovarian cyst 12/18/2016  . Perianal wart 02/10/2015  . Stress 02/14/2016  . Weight loss counseling, encounter for 03/13/2016        Past Surgical History:  Procedure Laterality Date  . CESAREAN SECTION    . CHOLECYSTECTOMY    . COLONOSCOPY    . ESOPHAGOGASTRODUODENOSCOPY ENDOSCOPY    . TUBAL LIGATION                     OB History  Gravida Para Term Preterm AB Living  2 2 2     2   SAB TAB Ectopic Multiple Live Births             2       # Outcome Date GA Lbr Len/2nd Weight Sex Delivery Anes PTL Lv  2 Term 09/05/06 [redacted]w[redacted]d   F Vag-Spont   LIV  1 Term 07/01/03 [redacted]w[redacted]d   M CS-LTranv   LIV  Patient denies any other pertinent gynecologic issues.         Current Outpatient Medications on File Prior to Visit  Medication Sig Dispense Refill  . acetaminophen (TYLENOL) 500 MG tablet Take 1,000 mg by mouth every 6 (six) hours as needed for mild pain, moderate pain or headache.    . albuterol (PROVENTIL HFA;VENTOLIN HFA) 108 (90  Base) MCG/ACT inhaler Inhale 1-2 puffs into the lungs every 6 (six) hours as needed for wheezing or shortness of breath. (Patient not taking: Reported on 06/15/2019) 1 Inhaler 0  . ibuprofen (ADVIL) 200 MG tablet Take 400-600 mg by mouth every 6 (six) hours as needed for headache, mild pain, moderate pain or cramping.    . megestrol (MEGACE) 40 MG tablet Take 1 tablet (40 mg total) by mouth 3 (three) times daily. Switch to once daily when bleeding stops. 45 tablet 0  . phenazopyridine (PYRIDIUM) 200 MG tablet Take 1 tablet (200 mg total) by mouth 3 (three) times daily as needed for pain. (Patient not taking: Reported on 07/07/2019) 12 tablet 0  . phentermine 37.5 MG capsule Take 37.5 mg by mouth every morning.    . sulfamethoxazole-trimethoprim (BACTRIM DS) 800-160 MG tablet Take 1 tablet by mouth 2 (two) times daily. (Patient not taking: Reported on 07/07/2019) 14 tablet 1   No current facility-administered medications on file prior to visit.         Allergies  Allergen Reactions  . Latex Itching  . Lisinopril Cough    coughing  Social History:   reports that she has been smoking cigarettes. She has a 20.00 pack-year smoking history. She has never used smokeless tobacco. She reports current alcohol use. She reports that she does not use drugs.       Family History  Problem Relation Age of Onset  . Diabetes Mother   . Kidney failure Mother   . Early death Mother 38       diabetes complications  . Cancer Father 64       colon cancer  . Depression Sister   . Hypertension Sister   . Anxiety disorder Sister   . Other Sister        back problems  . ADD / ADHD Daughter   . Other Daughter        behavioral issues  . ODD Daughter   . Asthma Son   . Cancer Maternal Grandmother        breast  . Seizures Maternal Grandmother   . Lupus Other     Review of Systems: Noncontributory  PHYSICAL EXAM: There were no vitals taken for this visit. General  appearance - alert, well appearing, and in no distress Chest - clear to auscultation, no wheezes, rales or rhonchi, symmetric air entry Heart - normal rate and regular rhythm Abdomen - soft, nontender, nondistended, no masses or organomegaly Pelvic - VAGINA: normal appearing vagina  CERVIX: normal appearing cervix without discharge or lesions, multiparous os,  UTERUS: uterus is normal size, shape, consistency and nontender, anteverted, mobile. PAP: Pap smear done today.  ENDOMETRIAL BIOPSY Endo bx reproduces pt dysmenorrhea  Patient given informed consent, signed copy in the chart, time out was performed. Appropriate time out taken. . The patient was placed in the lithotomy position and the cervix brought into view with sterile speculum.  Portio of cervix cleansed x 2 with betadine swabs.  A tenaculum was placed in the anterior lip of the cervix.  The uterus was sounded for depth of 9 cm. A pipelle was introduced to into the uterus, suction created,  and an endometrial sample was obtained. All equipment was removed and accounted for.  The patient tolerated the procedure well.       Extremities - peripheral pulses normal, no pedal edema, no clubbing or cyanosis  Labs: CBC    Component Value Date/Time   WBC 9.4 08/27/2019 1202   RBC 4.37 08/27/2019 1202   HGB 13.8 08/27/2019 1202   HCT 43.3 08/27/2019 1202   PLT 268 08/27/2019 1202   MCV 99.1 08/27/2019 1202   MCH 31.6 08/27/2019 1202   MCHC 31.9 08/27/2019 1202   RDW 12.9 08/27/2019 1202   LYMPHSABS 2.6 06/06/2019 1413   MONOABS 0.7 06/06/2019 1413   EOSABS 0.5 06/06/2019 1413   BASOSABS 0.1 06/06/2019 1413   CMP Latest Ref Rng & Units 08/27/2019 06/14/2019 06/06/2019  Glucose 70 - 99 mg/dL 85 98 90  BUN 6 - 20 mg/dL 14 16 9   Creatinine 0.44 - 1.00 mg/dL 9.93 7.16  Sodium 135 - 145 mmol/L 137 137 139  Potassium 3.5 - 5.1 mmol/L 3.8 4.5 3.9  Chloride 98 - 111 mmol/L 108 109 106  CO2 22 - 32 mmol/L 20(L) 21(L) 25   Calcium 8.9 - 10.3 mg/dL 8.9 9.1 8.9  Total Protein 6.5 - 8.1 g/dL 6.9 7.7 -  Total Bilirubin 0.3 - 1.2 mg/dL 0.7 0.3 -  Alkaline Phos 38 - 126 U/L 66 88 -  AST 15 - 41 U/L 18 17 -  ALT 0 - 44 U/L 15 12 -     Imaging Studies:     CLINICAL DATA:  38 year old female with right lower quadrant and pelvic pain. Negative urine pregnancy test. Unknown LMP.  EXAM: TRANSABDOMINAL AND TRANSVAGINAL ULTRASOUND OF PELVIS  DOPPLER ULTRASOUND OF OVARIES  TECHNIQUE: Both transabdominal and transvaginal ultrasound examinations of the pelvis were performed. Transabdominal technique was performed for global imaging of the pelvis including uterus, ovaries, adnexal regions, and pelvic cul-de-sac.  It was necessary to proceed with endovaginal exam following the transabdominal exam to visualize the ovaries. Color and duplex Doppler ultrasound was utilized to evaluate blood flow to the ovaries.  COMPARISON:  None.  FINDINGS: Uterus  Measurements: 9.4 x 4.5 x 5.3 centimeters = volume: 116 mL. No fibroids or other mass visualized.  Endometrium  Thickness: 10 millimeters.  No focal abnormality visualized.  Right ovary  Measurements: 3.2 x 2.1 x 3.1 centimeters = volume: 11 mL. Normal appearance/no adnexal mass.  Left ovary  Measurements: 2.5 x 1.9 x 2.0 centimeters = volume: 5 mL. Normal appearance/no adnexal mass.  Pulsed Doppler evaluation of both ovaries demonstrates normal low-resistance arterial and venous waveforms.  Other findings  No pelvic free fluid.  IMPRESSION: Normal pelvis ultrasound with Doppler. Negative for ovarian torsion.   Electronically Signed   By: Odessa Fleming M.D.   On: 11/20/2018 20:40   Assessment: menorrhagia                         Dysmenorrhea.                         Menorrhagia     Patient Active Problem List   Diagnosis Date Noted  . Ovarian cyst 12/18/2016  . 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  Numbness and tingling in left hand 08/19/2016  . Back pain with right-sided sciatica 06/18/2016  . Menorrhagia 04/10/2016  . Dysmenorrhea 04/10/2016  . Muscle spasm of back 03/13/2016  . Obesity 03/13/2016  . Weight loss counseling, encounter for 03/13/2016  . Stress 02/14/2016  . Bipolar disorder (HCC) 01/15/2016  . Hiatal hernia 01/15/2016  . Migraine 01/15/2016  . Tobacco use disorder 07/10/2013  . GERD (gastroesophageal reflux disease) 07/10/2013  . HTN (hypertension) 07/08/2013  . Asthma, mild persistent 04/05/2013  . Hyperlipidemia 04/09/2011  . Depressive disorder, not elsewhere classified 03/27/2007  . Allergic rhinitis 08/16/2003    Plan: Patient will undergo surgical management with hysteroscopy, dilation and curettage ,endometrial ablation with Minerva.    megace has been given to suppress menses preceding this procedure.    By signing my name below, I, Arnette Norris, attest that this documentation has been prepared under the direction and in the presence of Tilda Burrow, MD. Electronically Signed: Arnette Norris Medical Scribe. 07/23/19. 9:27 AM.  I personally performed the services described in this documentation, which was SCRIBED in my presence. The recorded information has been reviewed and considered accurate. It has been edited as necessary during review. Tilda Burrow, MD

## 2019-08-31 NOTE — Op Note (Signed)
Please see the brief operative note for surgical details 

## 2019-08-31 NOTE — Anesthesia Procedure Notes (Signed)
Procedure Name: Intubation Date/Time: 08/31/2019 7:36 AM Performed by: Ollen Bowl, CRNA Pre-anesthesia Checklist: Patient identified, Patient being monitored, Timeout performed, Emergency Drugs available and Suction available Patient Re-evaluated:Patient Re-evaluated prior to induction Oxygen Delivery Method: Circle system utilized Preoxygenation: Pre-oxygenation with 100% oxygen Induction Type: IV induction Ventilation: Mask ventilation without difficulty Laryngoscope Size: Mac and 3 Grade View: Grade I Tube type: Oral Tube size: 7.0 mm Number of attempts: 1 Airway Equipment and Method: Stylet Placement Confirmation: ETT inserted through vocal cords under direct vision,  positive ETCO2 and breath sounds checked- equal and bilateral Secured at: 21 cm Tube secured with: Tape Dental Injury: Teeth and Oropharynx as per pre-operative assessment

## 2019-08-31 NOTE — Transfer of Care (Signed)
Immediate Anesthesia Transfer of Care Note  Patient: Destiny Petty  Procedure(s) Performed: DILATATION AND CURETTAGE (no specimen) /HYSTEROSCOPY WITH MINERVA ENDOMETRIAL ABLATION (N/A )  Patient Location: PACU  Anesthesia Type:General  Level of Consciousness: awake  Airway & Oxygen Therapy: Patient Spontanous Breathing  Post-op Assessment: Report given to RN  Post vital signs: Reviewed and stable  Last Vitals:  Vitals Value Taken Time  BP 149/77 08/31/19 0820  Temp    Pulse 87 08/31/19 0824  Resp 21 08/31/19 0824  SpO2 99 % 08/31/19 0824  Vitals shown include unvalidated device data.  Last Pain:  Vitals:   08/31/19 0705  TempSrc: Oral  PainSc: 0-No pain         Complications: No apparent anesthesia complications

## 2019-08-31 NOTE — Anesthesia Postprocedure Evaluation (Signed)
Anesthesia Post Note  Patient: Destiny Petty  Procedure(s) Performed: DILATATION AND CURETTAGE (no specimen) /HYSTEROSCOPY WITH MINERVA ENDOMETRIAL ABLATION (N/A )  Patient location during evaluation: PACU Anesthesia Type: General Level of consciousness: awake and alert and oriented Pain management: pain level controlled Vital Signs Assessment: post-procedure vital signs reviewed and stable Respiratory status: spontaneous breathing Cardiovascular status: blood pressure returned to baseline and stable Postop Assessment: no apparent nausea or vomiting Anesthetic complications: no     Last Vitals:  Vitals:   08/31/19 0845 08/31/19 0900  BP: (!) 125/91 127/70  Pulse: 75 60  Resp: 13 14  Temp:    SpO2: 97% 96%    Last Pain:  Vitals:   08/31/19 0900  TempSrc:   PainSc: 4                  Beverlyn Mcginness

## 2019-08-31 NOTE — Op Note (Signed)
08/31/2019  8:26 AM  PATIENT:  Destiny Petty  38 y.o. female  PRE-OPERATIVE DIAGNOSIS:  Menorrhagia Dysmenorrhea  POST-OPERATIVE DIAGNOSIS:  Menorrhagia Dysmenorrhea  PROCEDURE:  Procedure(s): DILATATION AND CURETTAGE (no specimen) /HYSTEROSCOPY WITH MINERVA ENDOMETRIAL ABLATION (N/A)  SURGEON:  Surgeon(s) and Role:    Jonnie Kind, MD - Primary  PHYSICIAN ASSISTANT:   ASSISTANTS: None.  ANESTHESIA:   local, general and paracervical block  EBL:  10 mL   BLOOD ADMINISTERED:none  DRAINS: none   LOCAL MEDICATIONS USED:  MARCAINE    and Amount: 20 ml  SPECIMEN:  No Specimen  DISPOSITION OF SPECIMEN:  N/A  COUNTS:  YES  TOURNIQUET:  * No tourniquets in log *  DICTATION: .Dragon Dictation  PLAN OF CARE: Discharge to home after PACU  PATIENT DISPOSITION:  PACU - hemodynamically stable.   Delay start of Pharmacological VTE agent (>24hrs) due to surgical blood loss or risk of bleeding: not applicable Details of procedure: Patient was taken the operating room prepped and draped for vaginal procedure with timeout conducted after general anesthesia introduced and prepping and draping performed.  Speculum was inserted the cervix grasped with single-tooth tenaculum, paracervical block applied using 20 cc of Marcaine with epinephrine, then the uterus sounded in the anteflexed position to 10 cm, dilated to 23 [place] upon which a operative hysteroscope could be introduced, 30 degree angle hysteroscope, and the endometrial cavity visualized is smooth and normal.  Photos were taken to document tubal ostia and general endometrial contours.  Brief curettage with a 0 curette were performed and minimal tissue fragments obtained.  The Minerva endometrial ablation device was then prepared activated inserted with endometrial cavity depth of 5.5 cm on the setting, the testing was confirmed and then the 122nd endometrial ablation sequence activated and completed successfully.  Post  procedure hysteroscopy confirmed a thermal change to the endometrial cavity contours and this was photo documented.  Equipment was recovered in its entirety, the sponge counts were correct and confirmed with the sponge count wand.  Patient to recovery room in stable condition EBL 5 cc

## 2019-08-31 NOTE — Discharge Instructions (Signed)
Dr. Glo Herring may be reached on his cell phone [phone] during the next 48 hours for concerns thank you  Endometrial Ablation Endometrial ablation is a procedure that destroys the thin inner layer of the lining of the uterus (endometrium). This procedure may be done:  To stop heavy periods.  To stop bleeding that is causing anemia.  To control irregular bleeding.  To treat bleeding caused by small tumors (fibroids) in the endometrium. This procedure is often an alternative to major surgery, such as removal of the uterus and cervix (hysterectomy). As a result of this procedure:  You may not be able to have children. However, if you are premenopausal (you have not gone through menopause): ? You may still have a small chance of getting pregnant. ? You will need to use a reliable method of birth control after the procedure to prevent pregnancy.  You may stop having a menstrual period, or you may have only a small amount of bleeding during your period. Menstruation may return several years after the procedure. Tell a health care provider about:  Any allergies you have.  All medicines you are taking, including vitamins, herbs, eye drops, creams, and over-the-counter medicines.  Any problems you or family members have had with the use of anesthetic medicines.  Any blood disorders you have.  Any surgeries you have had.  Any medical conditions you have. What are the risks? Generally, this is a safe procedure. However, problems may occur, including:  A hole (perforation) in the uterus or bowel.  Infection of the uterus, bladder, or vagina.  Bleeding.  Damage to other structures or organs.  An air bubble in the lung (air embolus).  Problems with pregnancy after the procedure.  Failure of the procedure.  Decreased ability to diagnose cancer in the endometrium. What happens before the procedure?  You will have tests of your endometrium to make sure there are no  pre-cancerous cells or cancer cells present.  You may have an ultrasound of the uterus.  You may be given medicines to thin the endometrium.  Ask your health care provider about: ? Changing or stopping your regular medicines. This is especially important if you take diabetes medicines or blood thinners. ? Taking medicines such as aspirin and ibuprofen. These medicines can thin your blood. Do not take these medicines before your procedure if your doctor tells you not to.  Plan to have someone take you home from the hospital or clinic. What happens during the procedure?   You will lie on an exam table with your feet and legs supported as in a pelvic exam.  To lower your risk of infection: ? Your health care team will wash or sanitize their hands and put on germ-free (sterile) gloves. ? Your genital area will be washed with soap.  An IV tube will be inserted into one of your veins.  You will be given a medicine to help you relax (sedative).  A surgical instrument with a light and camera (resectoscope) will be inserted into your vagina and moved into your uterus. This allows your surgeon to see inside your uterus.  Endometrial tissue will be removed using one of the following methods: ? Radiofrequency. This method uses a radiofrequency-alternating electric current to remove the endometrium. ? Cryotherapy. This method uses extreme cold to freeze the endometrium. ? Heated-free liquid. This method uses a heated saltwater (saline) solution to remove the endometrium. ? Microwave. This method uses high-energy microwaves to heat up the endometrium and remove it. ? Thermal  balloon. This method involves inserting a catheter with a balloon tip into the uterus. The balloon tip is filled with heated fluid to remove the endometrium. The procedure may vary among health care providers and hospitals. What happens after the procedure?  Your blood pressure, heart rate, breathing rate, and blood oxygen  level will be monitored until the medicines you were given have worn off.  As tissue healing occurs, you may notice vaginal bleeding for 4-6 weeks after the procedure. You may also experience: ? Cramps. ? Thin, watery vaginal discharge that is light pink or brown in color. ? A need to urinate more frequently than usual. ? Nausea.  Do not drive for 24 hours if you were given a sedative.  Do not have sex or insert anything into your vagina until your health care provider approves. Summary  Endometrial ablation is done to treat the many causes of heavy menstrual bleeding.  The procedure may be done only after medications have been tried to control the bleeding.  Plan to have someone take you home from the hospital or clinic. This information is not intended to replace advice given to you by your health care provider. Make sure you discuss any questions you have with your health care provider. Document Released: 10/04/2004 Document Revised: 05/12/2018 Document Reviewed: 12/12/2016 Elsevier Patient Education  2020 [employer].  Hysteroscopy Hysteroscopy is a procedure that is used to examine the inside of a woman's womb (uterus). This may be done for various reasons, including:  To look for lumps (tumors) and other growths in the uterus.  To evaluate abnormal bleeding, fibroid tumors, polyps, scar tissue (adhesions), or cancer of the uterus.  To determine the cause of an inability to get pregnant (infertility) or repeated losses of pregnancies (miscarriages).  To find a lost IUD (intrauterine device).  To perform a procedure that permanently prevents pregnancy (sterilization). During this procedure, a thin, flexible tube with a small light and camera (hysteroscope) is used to examine the uterus. The camera sends images to a monitor in the room so that your health care provider can view the inside of your uterus. A hysteroscopy should be done right after a menstrual period to make sure  that you are not pregnant. Tell a health care provider about:  Any allergies you have.  All medicines you are taking, including vitamins, herbs, eye drops, creams, and over-the-counter medicines.  Any problems you or family members have had with the use of anesthetic medicines.  Any blood disorders you have.  Any surgeries you have had.  Any medical conditions you have.  Whether you are pregnant or may be pregnant. What are the risks? Generally, this is a safe procedure. However, problems may occur, including:  Excessive bleeding.  Infection.  Damage to the uterus or other structures or organs.  Allergic reaction to medicines or fluids that are used in the procedure. What happens before the procedure? Staying hydrated Follow instructions from your health care provider about hydration, which may include:  Up to 2 hours before the procedure - you may continue to drink clear liquids, such as water, clear fruit juice, black coffee, and plain tea. Eating and drinking restrictions Follow instructions from your health care provider about eating and drinking, which may include:  8 hours before the procedure - stop eating solid foods and drink clear liquids only  2 hours before the procedure - stop drinking clear liquids. General instructions  Ask your health care provider about: ? Changing or stopping your normal  medicines. This is important if you take diabetes medicines or blood thinners. ? Taking medicines such as aspirin and ibuprofen. These medicines can thin your blood and cause bleeding. Do not take these medicines for 1 week before your procedure, or as told by your health care provider.  Do not use any products that contain nicotine or tobacco for 2 weeks before the procedure. This includes cigarettes and e-cigarettes. If you need help quitting, ask your health care provider.  Medicine may be placed in your cervix the day before the procedure. This medicine causes the  cervix to have a larger opening (dilate). The larger opening makes it easier for the hysteroscope to be inserted into the uterus during the procedure.  Plan to have someone with you for the first 24-48 hours after the procedure, especially if you are given a medicine to make you fall asleep (general anesthetic).  Plan to have someone take you home from the hospital or clinic. What happens during the procedure?  To lower your risk of infection: ? Your health care team will wash or sanitize their hands. ? Your skin will be washed with soap. ? Hair may be removed from the surgical area.  An IV tube will be inserted into one of your veins.  You may be given one or more of the following: ? A medicine to help you relax (sedative). ? A medicine that numbs the area around the cervix (local anesthetic). ? A medicine to make you fall asleep (general anesthetic).  A hysteroscope will be inserted through your vagina and into your uterus.  Air or fluid will be used to enlarge your uterus, enabling your health care provider to see your uterus better. The amount of fluid used will be carefully checked throughout the procedure.  In some cases, tissue may be gently scraped from inside the uterus and sent to a lab for testing (biopsy). The procedure may vary among health care providers and hospitals. What happens after the procedure?  Your blood pressure, heart rate, breathing rate, and blood oxygen level will be monitored until the medicines you were given have worn off.  You may have some cramping. You may be given medicines for this.  You may have bleeding, which varies from light spotting to menstrual-like bleeding. This is normal.  If you had a biopsy done, it is your responsibility to get the results of your procedure. Ask your health care provider, or the department performing the procedure, when your results will be ready. Summary  Hysteroscopy is a procedure that is used to examine the  inside of a woman's womb (uterus).  After the procedure, you may have bleeding, which varies from light spotting to menstrual-like bleeding. This is normal. You may also have cramping.  Plan to have someone take you home from the hospital or clinic. This information is not intended to replace advice given to you by your health care provider. Make sure you discuss any questions you have with your health care provider. Document Released: 03/03/2001 Document Revised: 11/07/2017 Document Reviewed: 12/24/2016 Elsevier Patient Education  2020 [employer].

## 2019-09-01 ENCOUNTER — Encounter (HOSPITAL_COMMUNITY): Payer: Self-pay | Admitting: Obstetrics and Gynecology

## 2019-09-10 ENCOUNTER — Encounter (HOSPITAL_COMMUNITY): Payer: Self-pay | Admitting: Emergency Medicine

## 2019-09-10 ENCOUNTER — Other Ambulatory Visit: Payer: Self-pay

## 2019-09-10 ENCOUNTER — Emergency Department (HOSPITAL_COMMUNITY): Payer: Medicaid Other

## 2019-09-10 ENCOUNTER — Emergency Department (HOSPITAL_COMMUNITY)
Admission: EM | Admit: 2019-09-10 | Discharge: 2019-09-11 | Disposition: A | Discharge status: Home / Self Care | Payer: Medicaid Other | Attending: Emergency Medicine | Admitting: Emergency Medicine

## 2019-09-10 DIAGNOSIS — J0191 Acute recurrent sinusitis, unspecified: Secondary | ICD-10-CM

## 2019-09-10 DIAGNOSIS — R1030 Lower abdominal pain, unspecified: Secondary | ICD-10-CM | POA: Insufficient documentation

## 2019-09-10 DIAGNOSIS — R109 Unspecified abdominal pain: Secondary | ICD-10-CM

## 2019-09-10 DIAGNOSIS — Z48816 Encounter for surgical aftercare following surgery on the genitourinary system: Secondary | ICD-10-CM | POA: Diagnosis not present

## 2019-09-10 DIAGNOSIS — Z20828 Contact with and (suspected) exposure to other viral communicable diseases: Secondary | ICD-10-CM | POA: Diagnosis not present

## 2019-09-10 LAB — COMPREHENSIVE METABOLIC PANEL
ALT: 14 U/L (ref 0–44)
AST: 16 U/L (ref 15–41)
Albumin: 4.1 g/dL (ref 3.5–5.0)
Alkaline Phosphatase: 78 U/L (ref 38–126)
Anion gap: 6 (ref 5–15)
BUN: 15 mg/dL (ref 6–20)
CO2: 19 mmol/L — ABNORMAL LOW (ref 22–32)
Calcium: 8.8 mg/dL — ABNORMAL LOW (ref 8.9–10.3)
Chloride: 113 mmol/L — ABNORMAL HIGH (ref 98–111)
Creatinine, Ser: 0.57 mg/dL (ref 0.44–1.00)
GFR calc Af Amer: >60 mL/min (ref >60)
GFR calc non Af Amer: >60 mL/min (ref >60)
Glucose, Bld: 76 mg/dL (ref 70–99)
Potassium: 3.7 mmol/L (ref 3.5–5.1)
Sodium: 138 mmol/L (ref 135–145)
Total Bilirubin: 0.3 mg/dL (ref 0.3–1.2)
Total Protein: 6.5 g/dL (ref 6.5–8.1)

## 2019-09-10 LAB — CBC WITH DIFFERENTIAL/PLATELET
Abs Immature Granulocytes: 0.02 10*3/uL (ref 0.00–0.07)
Basophils Absolute: 0 10*3/uL (ref 0.0–0.1)
Basophils Relative: 0 %
Eosinophils Absolute: 0.6 10*3/uL — ABNORMAL HIGH (ref 0.0–0.5)
Eosinophils Relative: 6 %
HCT: 40.6 % (ref 36.0–46.0)
Hemoglobin: 13.4 g/dL (ref 12.0–15.0)
Immature Granulocytes: 0 %
Lymphocytes Relative: 25 %
Lymphs Abs: 2.4 10*3/uL (ref 0.7–4.0)
MCH: 32.4 pg (ref 26.0–34.0)
MCHC: 33 g/dL (ref 30.0–36.0)
MCV: 98.3 fL (ref 80.0–100.0)
Monocytes Absolute: 0.7 10*3/uL (ref 0.1–1.0)
Monocytes Relative: 8 %
Neutro Abs: 5.8 10*3/uL (ref 1.7–7.7)
Neutrophils Relative %: 61 %
Platelets: 202 10*3/uL (ref 150–400)
RBC: 4.13 MIL/uL (ref 3.87–5.11)
RDW: 12.5 % (ref 11.5–15.5)
WBC: 9.6 10*3/uL (ref 4.0–10.5)
nRBC: 0 % (ref 0.0–0.2)

## 2019-09-10 LAB — URINALYSIS, ROUTINE W REFLEX MICROSCOPIC
Bilirubin Urine: NEGATIVE
Glucose, UA: NEGATIVE mg/dL
Hgb urine dipstick: NEGATIVE
Ketones, ur: NEGATIVE mg/dL
Leukocytes,Ua: NEGATIVE
Nitrite: NEGATIVE
Protein, ur: NEGATIVE mg/dL
Specific Gravity, Urine: 1.02 (ref 1.005–1.030)
pH: 5 (ref 5.0–8.0)

## 2019-09-10 MED ORDER — IOHEXOL 300 MG/ML  SOLN
100.0000 mL | Freq: Once | INTRAMUSCULAR | Status: AC | PRN
  Administered 2019-09-10: 100 mL via INTRAVENOUS

## 2019-09-10 NOTE — ED Triage Notes (Signed)
Called for triage x1, not available

## 2019-09-10 NOTE — ED Provider Notes (Signed)
[facility] EMERGENCY DEPARTMENT Provider Note   CSN: [id number] Arrival date & time: 09/10/19  1656     History   Chief Complaint Chief Complaint  Patient presents with   Abdominal Pain    HPI Destiny Petty is a 38 y.o. female.     HPI   Destiny Petty is a 38 y.o. female who is 10 days s/p D&C with endometrial ablation, who presents to the Emergency Department complaining of mid lower abdominal pain for 3 to 4 days.  She states that she went back to work approximately 1 week ago and lifted a heavy box yesterday.  She states the pain has gradually worsened since that time.  She describes the pain is dull and cramp-like.  Pain does not radiate.  She also endorses some urinary frequency and burning with urination.  No hematuria.  No vaginal discharge or vaginal bleeding.  She also complains of nasal congestion with frontal sinus pressure and pain and sore throat.  Her symptoms have also been associated with fever and chills and nonproductive cough.  She states that she had a negative COVID test on 08/31/2019.  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  Fever is subjective.  She denies chest pain, shortness of breath, vomiting or diarrhea.  No loss of taste or smell.    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   Anxiety    Asthma    Back pain with right-sided sciatica 06/18/2016   Body mass index 36.0-36.9, adult 04/10/2016   Body mass index 37.0-37.9, adult 03/13/2016   Depression    Dysmenorrhea 04/10/2016   Family history of adverse reaction to anesthesia    PONV   GERD (gastroesophageal reflux disease)    Hemorrhoids 02/10/2015   Hiatal hernia    Hyperlipidemia 04/09/2011   Hypertension    Menorrhagia with regular cycle 04/10/2016   Muscle spasm of back 03/13/2016   Obesity    Ovarian cyst 12/18/2016   Perianal wart 02/10/2015   Stress 02/14/2016   Weight loss  counseling, encounter for 03/13/2016    Patient Active Problem List   Diagnosis Date Noted   Ovarian cyst 12/18/2016   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   Numbness and tingling in left hand 08/19/2016   Back pain with right-sided sciatica 06/18/2016   Menorrhagia with regular cycle 04/10/2016   Dysmenorrhea 04/10/2016   Muscle spasm of back 03/13/2016   Obesity 03/13/2016   Weight loss counseling, encounter for 03/13/2016   Stress 02/14/2016   Bipolar disorder (HCC) 01/15/2016   Hiatal hernia 01/15/2016   Migraine 01/15/2016   Tobacco use disorder 07/10/2013   GERD (gastroesophageal reflux disease) 07/10/2013   HTN (hypertension) 07/08/2013   Asthma, mild persistent 04/05/2013   Hyperlipidemia 04/09/2011   Depressive disorder, not elsewhere classified 03/27/2007   Allergic rhinitis 08/16/2003    Past Surgical History:  Procedure Laterality Date   CESAREAN SECTION     CHOLECYSTECTOMY     COLONOSCOPY     DILATATION AND CURETTAGE/HYSTEROSCOPY WITH MINERVA N/A 08/31/2019   Procedure: DILATATION AND CURETTAGE (no specimen) /HYSTEROSCOPY WITH MINERVA ENDOMETRIAL ABLATION;  Surgeon: Tilda Burrow, MD;  Location: [facility];  Service: Gynecology;  Laterality: N/A;   ESOPHAGOGASTRODUODENOSCOPY ENDOSCOPY     TUBAL LIGATION       OB History    Gravida  2   Para  2   Term  2   Preterm      AB      Living  2     SAB      TAB      Ectopic      Multiple      Live Births  2            Home Medications    Prior to Admission medications   Medication Sig Start Date End Date Taking? Authorizing Provider  acetaminophen (TYLENOL) 500 MG tablet Take 1,000 mg by mouth every 6 (six) hours as needed for mild pain, moderate pain or headache.    [provider]  albuterol (PROVENTIL HFA;VENTOLIN HFA) 108 (90 Base) MCG/ACT inhaler Inhale 1-2 puffs into the lungs every 6 (six) hours as needed for wheezing or shortness of  breath. 01/26/19   Henderly, Britni A, PA-C  HYDROcodone-acetaminophen (NORCO/VICODIN) 5-325 MG tablet Take 1 tablet by mouth every 6 (six) hours as needed for moderate pain. May take with ibuprofen 08/31/19   Jonnie Kind, MD  ketorolac (TORADOL) 10 MG tablet Take 1 tablet (10 mg total) by mouth every 6 (six) hours. For up to 5 days 08/31/19   Jonnie Kind, MD    Family History Family History  Problem Relation Age of Onset   Diabetes Mother    Kidney failure Mother    Early death Mother 42       diabetes complications   Cancer Father 78       colon cancer   Depression Sister    Hypertension Sister    Anxiety disorder Sister    Other Sister        back problems   ADD / ADHD Daughter    Other Daughter        behavioral issues   ODD Daughter    Asthma Son    Cancer Maternal Grandmother        breast   Seizures Maternal Grandmother    Lupus Other     Social History Social History   Tobacco Use   Smoking status: Current Every Day Smoker    Packs/day: 1.00    Years: 20.00    Pack years: 20.00    Types: Cigarettes   Smokeless tobacco: Never Used   Tobacco comment: trying to quit  Substance Use Topics   Alcohol use: Yes    Comment: occ   Drug use: No     Allergies   Latex and Lisinopril   Review of Systems Review of Systems  Constitutional: Positive for chills and fever. Negative for appetite change.  HENT: Positive for congestion, rhinorrhea, sinus pressure, sinus pain and sore throat.   Respiratory: Positive for cough. Negative for chest tightness and shortness of breath.   Cardiovascular: Negative for chest pain.  Gastrointestinal: Positive for abdominal pain, nausea and vomiting. Negative for blood in stool and diarrhea.  Genitourinary: Positive for dysuria and pelvic pain. Negative for decreased urine volume, difficulty urinating, flank pain and vaginal bleeding.  Musculoskeletal: Negative for back pain.  Skin: Negative for color  change and rash.  Neurological: Negative for dizziness, weakness and numbness.  Hematological: Negative for adenopathy.     Physical Exam Updated Vital Signs BP (!) 149/101 (BP Location: Right Arm)    Pulse 96    Temp 98.2 F (36.8 C) (Oral)    Resp 16    Ht 5' (1.524 m)    Wt 96.3 kg    SpO2 100%    BMI 41.48 kg/m  Physical Exam Vitals signs and nursing note reviewed.  Constitutional:      Appearance: She is well-developed. She is not ill-appearing.  HENT:     Head: Normocephalic.     Mouth/Throat:     Mouth: Mucous membranes are moist.  Cardiovascular:     Rate and Rhythm: Normal rate and regular rhythm.  Pulmonary:     Effort: Pulmonary effort is normal.     Breath sounds: Normal breath sounds. No stridor. No wheezing.  Abdominal:     General: There is no distension.     Palpations: Abdomen is soft. There is no mass.     Tenderness: There is abdominal tenderness. There is no right CVA tenderness, left CVA tenderness or guarding.     Comments: Mild tenderness to palpation of the suprapubic area.  No guarding or rebound tenderness.  Abdomen is soft.  No distention  Musculoskeletal: Normal range of motion.  Skin:    General: Skin is warm.     Capillary Refill: Capillary refill takes less than 2 seconds.     Findings: No erythema or rash.  Neurological:     Mental Status: She is alert.     Sensory: No sensory deficit.     Motor: No weakness.      ED Treatments / Results  Labs (all labs ordered are listed, but only abnormal results are displayed) Labs Reviewed  COMPREHENSIVE METABOLIC PANEL - Abnormal; Notable for the following components:      Result Value   Chloride 113 (*)    CO2 19 (*)    Calcium 8.8 (*)    All other components within normal limits  CBC WITH DIFFERENTIAL/PLATELET - Abnormal; Notable for the following components:   Eosinophils Absolute 0.6 (*)    All other components within normal limits  SARS CORONAVIRUS 2 (TAT 6-24 HRS)  URINALYSIS,  ROUTINE W REFLEX MICROSCOPIC  PREGNANCY, URINE    EKG None  Radiology Ct Abdomen Pelvis W Contrast  Result Date: 09/10/2019 CLINICAL DATA:  Ten days post endometrial ablation EXAM: CT ABDOMEN AND PELVIS WITH CONTRAST TECHNIQUE: Multidetector CT imaging of the abdomen and pelvis was performed using the standard protocol following bolus administration of intravenous contrast. CONTRAST:  100mL OMNIPAQUE IOHEXOL 300 MG/ML  SOLN COMPARISON:  CT 02/22/2016, pelvic ultrasound 06/14/2019 FINDINGS: Lower chest: Mild atelectatic changes in the bases. Lung bases otherwise clear. Normal heart size. No pericardial effusion. Hepatobiliary: No focal liver abnormality is seen. Patient is post cholecystectomy. Slight prominence of the biliary tree likely related to reservoir effect. No calcified intraductal gallstones. Pancreas: Unremarkable. No pancreatic ductal dilatation or surrounding inflammatory changes. Spleen: Normal in size without focal abnormality. Adrenals/Urinary Tract: Adrenal glands are unremarkable. Kidneys are normal, without renal calculi, focal lesion, or hydronephrosis. No bladder wall thickening. Mild stranding seen along the anterior bladder wall is similar to comparison and may be related to prior Caesarean section. Stomach/Bowel: Distal esophagus, stomach and duodenal sweep are unremarkable. No small bowel wall thickening or dilatation. No evidence of obstruction. A normal appendix is visualized. Scattered colonic diverticula without focal pericolonic inflammation to suggest diverticulitis. No colonic dilatation or wall thickening. Vascular/Lymphatic: The aorta is normal caliber. No aneurysm or ectasia. Reproductive: Thickened appearance of the endometrium, nonspecific given recent endometrial ablation. No endometrial gas or abscess formation is clearly discernible. No concerning adnexal lesions. Normal follicle in the left ovary. Other: No abdominopelvic free fluid or free gas. No bowel containing  hernias. Musculoskeletal: No acute osseous abnormality or suspicious osseous lesion. Multilevel  degenerative changes are present in the imaged portions of the spine. Features most pronounced at L4-L5 and L5-S1 IMPRESSION: 1. Mildly thickened appearance of the endometrium, nonspecific given recent endometrial ablation. Consider further evaluation with pelvic ultrasound if clinically warranted. 2. No other acute intra-abdominal process. Electronically Signed   By: Kreg Shropshire M.D.   On: 09/10/2019 23:57    Procedures Procedures (including critical care time)  Medications Ordered in ED Medications - No data to display   Initial Impression / Assessment and Plan / ED Course  I have reviewed the triage vital signs and the nursing notes.  Pertinent labs & imaging results that were available during my care of the patient were reviewed by me and considered in my medical decision making (see chart for details).        Pt with lower abdominal pain and 10 days s/p endometrial ablation.  CT abd/pelvis is negative for abscess.  Mild thickening of th endometrium which is likely related to her recent procedure.  I have scheduled her to have an outpt pelvic ultrasound and she agrees to arrange f/u with [facility]. She also has some upper respiratory sx's and endorses recurrent sinusitis.  COVID test is pending.  I feel she is appropriate for d/c home.  Return precautions discussed.    Final Clinical Impressions(s) / ED Diagnoses   Final diagnoses:  Abdominal pain, unspecified abdominal location  Acute recurrent sinusitis, unspecified location    ED Discharge Orders    None       Pauline Aus, PA-C 09/12/19 0026    Sabas Sous, MD 09/12/19 2119

## 2019-09-10 NOTE — ED Triage Notes (Signed)
Post-op 08/31/2019 ablation, c/o abdominal rating pain 8/10.  C/o ear pain bilaterally, headache, and eye pressure, rating pain 10/10.

## 2019-09-11 LAB — PREGNANCY, URINE: Preg Test, Ur: NEGATIVE

## 2019-09-11 LAB — SARS CORONAVIRUS 2 (TAT 6-24 HRS): SARS Coronavirus 2: NEGATIVE

## 2019-09-11 MED ORDER — BENZONATATE 200 MG PO CAPS: 200.0000 mg | ORAL_CAPSULE | Freq: Three times a day (TID) | ORAL | 0 refills | Status: DC | PRN

## 2019-09-11 MED ORDER — HYDROCODONE-ACETAMINOPHEN 5-325 MG PO TABS
1.0000 | ORAL_TABLET | Freq: Once | ORAL | Status: AC
  Administered 2019-09-11: 01:00:00 1 via ORAL
  Filled 2019-09-11: qty 1

## 2019-09-11 MED ORDER — AZITHROMYCIN 250 MG PO TABS: ORAL_TABLET | ORAL | 0 refills | Status: DC

## 2019-09-11 MED ORDER — ALBUTEROL SULFATE HFA 108 (90 BASE) MCG/ACT IN AERS
2.0000 | INHALATION_SPRAY | Freq: Once | RESPIRATORY_TRACT | Status: AC
  Administered 2019-09-11: 01:00:00 2 via RESPIRATORY_TRACT
  Filled 2019-09-11: qty 6.7

## 2019-09-11 MED ORDER — PREDNISONE 20 MG PO TABS
40.0000 mg | ORAL_TABLET | Freq: Once | ORAL | Status: AC
  Administered 2019-09-11: 40 mg via ORAL
  Filled 2019-09-11: qty 2

## 2019-09-11 NOTE — Discharge Instructions (Signed)
You have been scheduled to have an out patient ultrasound of your pelvis.  Call the scheduling dept at [phone] tomorrow to arrange an appt time.  1 to 2 puffs of the albuterol inhaler every 4-6 hours as needed.  Your COVID test is pending.  Your results should be back in 1 to 2 days.  You may review your results on MyChart.  Isolate at home until your results are back.  Call Dr. Johnnye Sima office to arrange a follow-up appointment.

## 2019-09-16 ENCOUNTER — Ambulatory Visit
Admission: EM | Admit: 2019-09-16 | Discharge: 2019-09-16 | Disposition: A | Discharge status: Home / Self Care | Payer: Medicaid Other | Attending: Emergency Medicine | Admitting: Emergency Medicine

## 2019-09-16 ENCOUNTER — Other Ambulatory Visit: Payer: Self-pay

## 2019-09-16 DIAGNOSIS — Z20828 Contact with and (suspected) exposure to other viral communicable diseases: Secondary | ICD-10-CM | POA: Diagnosis not present

## 2019-09-16 DIAGNOSIS — J0100 Acute maxillary sinusitis, unspecified: Secondary | ICD-10-CM

## 2019-09-16 DIAGNOSIS — Z20822 Contact with and (suspected) exposure to covid-19: Secondary | ICD-10-CM

## 2019-09-16 MED ORDER — AMOXICILLIN-POT CLAVULANATE 875-125 MG PO TABS: 1.0000 | ORAL_TABLET | Freq: Two times a day (BID) | ORAL | 0 refills | Status: AC

## 2019-09-16 MED ORDER — PREDNISONE 20 MG PO TABS: 20.0000 mg | ORAL_TABLET | Freq: Two times a day (BID) | ORAL | 0 refills | Status: AC

## 2019-09-16 NOTE — ED Triage Notes (Signed)
Pt has completed z pack for symptoms and they have become worse with fever

## 2019-09-16 NOTE — Discharge Instructions (Addendum)
COVID testing ordered.  It will take between 5-7 days for test results.  Someone will contact you regarding abnormal results.    In the meantime: You should remain isolated in your home for 10 days from symptom onset AND greater than 72 hours after symptoms resolution (absence of fever without the use of fever-reducing medication and improvement in respiratory symptoms), whichever is longer Get plenty of rest and push fluids However, based on symptoms and exam will treat for possible sinus infection Augmentin and prednisone prescribed.  Take as directed and to completion Use OTC medications like ibuprofen or tylenol as needed fever or pain Follow up with via video or telephone with PCP earlier next week for recheck and to ensure your symptoms are improving Call or go to the ED if you have any new or worsening symptoms such as fever, worsening cough, shortness of breath, chest tightness, chest pain, turning blue, changes in mental status, etc..[name]

## 2019-09-16 NOTE — ED Provider Notes (Signed)
[place]   [id number] 09/16/19 Arrival Time: 0932   CC: URI symptoms; covid test  SUBJECTIVE: History from: patient.  Destiny Petty is a 38 y.o. female who presents with runny nose, frontal and maxillary sinus congestion/ pain, cough, SOB, wheezing, subjective fever, body aches, and chills x 5 days.  Denies sick exposure to COVID, flu or strep.  Denies recent travel.  However, boss at work with similar symptoms.  Was treated with a z-pak for URI symptoms, denies improvement in symptoms.  Symptoms worsening over the past few days.  Worse with leaning forward.  Reports previous symptoms in the past related to sinus infection as well as bronchitis.   Denies sore throat, chest pain, nausea, changes in bowel or bladder habits.    Recent ablation.  Admits to tobacco use 1 PPD x multiple years  ROS: As per HPI.  All other pertinent ROS negative.     Past Medical History:  Diagnosis Date  . Allergy    SEASONAL  . 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 Anxiety   . Asthma   . Back pain with right-sided sciatica 06/18/2016  . Body mass index 36.0-36.9, adult 04/10/2016  . Body mass index 37.0-37.9, adult 03/13/2016  . Depression   . Dysmenorrhea 04/10/2016  . Family history of adverse reaction to anesthesia    PONV  . GERD (gastroesophageal reflux disease)   . Hemorrhoids 02/10/2015  . Hiatal hernia   . Hyperlipidemia 04/09/2011  . Hypertension   . Menorrhagia with regular cycle 04/10/2016  . Muscle spasm of back 03/13/2016  . Obesity   . Ovarian cyst 12/18/2016  . Perianal wart 02/10/2015  . Stress 02/14/2016  . Weight loss counseling, encounter for 03/13/2016   Past Surgical History:  Procedure Laterality Date  . CESAREAN SECTION    . CHOLECYSTECTOMY    . COLONOSCOPY    . DILATATION AND CURETTAGE/HYSTEROSCOPY WITH MINERVA N/A 08/31/2019   Procedure: DILATATION AND CURETTAGE (no specimen) /HYSTEROSCOPY WITH MINERVA ENDOMETRIAL ABLATION;  Surgeon: Jonnie Kind, MD;  Location: [facility];  Service: Gynecology;  Laterality: N/A;  . ESOPHAGOGASTRODUODENOSCOPY ENDOSCOPY    . TUBAL LIGATION     Allergies  Allergen Reactions  . Latex Itching  . Lisinopril Cough    coughing   No current facility-administered medications on file prior to encounter.    Current Outpatient Medications on File Prior to Encounter  Medication Sig Dispense Refill  . acetaminophen (TYLENOL) 500 MG tablet Take 1,000 mg by mouth every 6 (six) hours as needed for mild pain, moderate pain or headache.    . albuterol (PROVENTIL HFA;VENTOLIN HFA) 108 (90 Base) MCG/ACT inhaler Inhale 1-2 puffs into the lungs every 6 (six) hours as needed for wheezing or shortness of breath. 1 Inhaler 0  . azithromycin (ZITHROMAX) 250 MG tablet Take first 2 tablets together, then 1 every day until finished. 6 tablet 0  . benzonatate (TESSALON) 200 MG capsule Take 1 capsule (200 mg total) by mouth 3 (three) times daily as needed for cough. 21 capsule 0  . HYDROcodone-acetaminophen (NORCO/VICODIN) 5-325 MG tablet Take 1 tablet by mouth every 6 (six) hours as needed for moderate pain. May take with ibuprofen 15 tablet 0  . ketorolac (TORADOL) 10 MG tablet Take 1 tablet (10 mg total) by mouth every 6 (six) hours. For up to 5 days 20 tablet 0   Social History   Socioeconomic History  . Marital status: Married    Spouse name: Elita Quick  . Number of  children: 2  . Years of education: 8014  . Highest education level: Not on file  Occupational History  . Occupation: Print production plannerleasing agent  Social Needs  . Financial resource strain: Not on file  . Food insecurity    Worry: Not on file    Inability: Not on file  . Transportation needs    Medical: Not on file    Non-medical: Not on file  Tobacco Use  . Smoking status: Current Every Day Smoker    Packs/day: 1.00    Years: 20.00    Pack years: 20.00    Types: Cigarettes  . Smokeless tobacco: Never Used  . Tobacco comment: trying to quit  Substance and Sexual Activity  . Alcohol use:  Yes    Comment: occ  . Drug use: No  . Sexual activity: Yes    Birth control/protection: Surgical    Comment: tubal  Lifestyle  . Physical activity    Days per week: Not on file    Minutes per session: Not on file  . Stress: Not on file  Relationships  . Social Musicianconnections    Talks on phone: Not on file    Gets together: Not on file    Attends religious service: Not on file    Active member of club or organization: Not on file    Attends meetings of clubs or organizations: Not on file    Relationship status: Not on file  . Intimate partner violence    Fear of current or ex partner: Not on file    Emotionally abused: Not on file    Physically abused: Not on file    Forced sexual activity: Not on file  Other Topics Concern  . Not on file  Social History Narrative   Married   3 children at home   Step son is 7   Family History  Problem Relation Age of Onset  . Diabetes Mother   . Kidney failure Mother   . Early death Mother [id number]       diabetes complications  . Cancer Father 7560       colon cancer  . Depression Sister   . Hypertension Sister   . Anxiety disorder Sister   . Other Sister        back problems  . ADD / ADHD Daughter   . Other Daughter        behavioral issues  . ODD Daughter   . Asthma Son   . Cancer Maternal Grandmother        breast  . Seizures Maternal Grandmother   . Lupus Other     OBJECTIVE:  Vitals:   09/16/19 1306  BP: (!) 146/89  Pulse: 92  Resp: 20  Temp: 98.4 F (36.9 C)  SpO2: 94%     General appearance: alert; appears fatigued, but nontoxic; speaking in full sentences and tolerating own secretions HEENT: NCAT; Ears: EACs clear, TMs pearly gray; Eyes: PERRL.  EOM grossly intact. Sinuses: TTP over frontal and maxillary sinuses; Nose: nares patent without rhinorrhea, Throat: oropharynx clear, tonsils non erythematous or enlarged, uvula midline  Neck: supple without LAD Lungs: unlabored respirations, symmetrical air entry; cough:  mild; no respiratory distress; CTAB Heart: regular rate and rhythm.  Radial pulses 2+ symmetrical bilaterally Skin: warm and dry Psychological: alert and cooperative; normal mood and affect  ASSESSMENT & PLAN:  1. Suspected COVID-19 virus infection   2. Acute non-recurrent maxillary sinusitis     Meds ordered this encounter  Medications  .  amoxicillin-clavulanate (AUGMENTIN) 875-125 MG tablet    Sig: Take 1 tablet by mouth every 12 (twelve) hours for 10 days.    Dispense:  20 tablet    Refill:  0    Order Specific Question:   Supervising Provider    Answer:   Eustace Moore [[id number]]  . predniSONE (DELTASONE) 20 MG tablet    Sig: Take 1 tablet (20 mg total) by mouth 2 (two) times daily with a meal for 5 days.    Dispense:  10 tablet    Refill:  0    Order Specific Question:   Supervising Provider    Answer:   Eustace Moore [[id number]]    COVID testing ordered.  It will take between 5-7 days for test results.  Someone will contact you regarding abnormal results.    In the meantime: You should remain isolated in your home for 10 days from symptom onset AND greater than 72 hours after symptoms resolution (absence of fever without the use of fever-reducing medication and improvement in respiratory symptoms), whichever is longer Get plenty of rest and push fluids However, based on symptoms and exam will treat for possible sinus infection Augmentin and prednisone prescribed.  Take as directed and to completion Use OTC medications like ibuprofen or tylenol as needed fever or pain Follow up with via video or telephone with PCP earlier next week for recheck and to ensure your symptoms are improving Call or go to the ED if you have any new or worsening symptoms such as fever, worsening cough, shortness of breath, chest tightness, chest pain, turning blue, changes in mental status, etc...   Reviewed expectations re: course of current medical issues. Questions answered. Outlined  signs and symptoms indicating need for more acute intervention. Patient verbalized understanding. After Visit Summary given.         Rennis Harding, PA-C 09/16/19 1333

## 2019-09-18 LAB — NOVEL CORONAVIRUS, NAA: SARS-CoV-2, NAA: NOT DETECTED

## 2019-09-21 ENCOUNTER — Encounter: Payer: Self-pay | Admitting: Obstetrics and Gynecology

## 2019-09-21 ENCOUNTER — Ambulatory Visit (INDEPENDENT_AMBULATORY_CARE_PROVIDER_SITE_OTHER): Payer: Medicaid Other | Admitting: Obstetrics and Gynecology

## 2019-09-21 ENCOUNTER — Other Ambulatory Visit: Payer: Self-pay

## 2019-09-21 VITALS — BP 147/91 | HR 110 | Ht 60.0 in | Wt 214.4 lb

## 2019-09-21 DIAGNOSIS — Z9889 Other specified postprocedural states: Secondary | ICD-10-CM | POA: Diagnosis not present

## 2019-09-21 DIAGNOSIS — Z09 Encounter for follow-up examination after completed treatment for conditions other than malignant neoplasm: Secondary | ICD-10-CM

## 2019-09-21 NOTE — Progress Notes (Signed)
Patient ID: Destiny Petty, female   DOB: [birth year], 38 y.o.   MRN: [id number]  Subjective:  Destiny Petty is a 38 y.o. female now 3 weeks status post hysteroscopy w/ minerva ablation.   Feels much better after surgery. Hasn't had any legs problems or cramps after surgery. When on phentermine back in June and would like refill. She had gain 20lbs on megace. BP has been running high but feels fine. Is trying to lose weight and has started dieting yesterday.  Review of Systems Negative except none   Diet:   normal   Bowel movements : normal.  The patient is not having any pain.  Objective:  There were no vitals taken for this visit. BP (!) 147/91 (BP Location: Right Arm, Patient Position: Sitting, Cuff Size: Large)   Pulse (!) 110   Ht 5' (1.524 m)   Wt 214 lb 6.4 oz (97.3 kg)   BMI 41.87 kg/m   General:Well developed, well nourished.  No acute distress. Abdomen: Bowel sounds normal, soft, non-tender. Pelvic Exam: Deferred  Incision(s): N/A  Assessment:  Post-Op 3 weeks s/p hysteroscopy w/ minerva ablation  Weight gain while on Megace Hx obesity with 100 lb weight loss. Hypertension  Doing well postoperatively.   Plan:  1.Pt will send in 1 weeks worth BP to be considered for phentermine refill, has bp cuff. 2. Activity restrictions: none 3. return to work: not applicable. 4. Follow up in PRN   By signing my name below, I, Destiny Petty, attest that this documentation has been prepared under the direction and in the presence of Jonnie Kind, MD. Electronically Signed: [place]. 09/21/19. 1:59 PM.  I personally performed the services described in this documentation, which was SCRIBED in my presence. The recorded information has been reviewed and considered accurate. It has been edited as necessary during review. Jonnie Kind, MD

## 2019-10-27 ENCOUNTER — Telehealth: Payer: Self-pay | Admitting: *Deleted

## 2019-10-27 NOTE — Telephone Encounter (Signed)
Voicemail not set up @ 12:41 pm. JSY

## 2019-10-27 NOTE — Telephone Encounter (Signed)
Pt left message that she would like an inhaler called to [facility].

## 2019-10-28 ENCOUNTER — Other Ambulatory Visit: Payer: Self-pay | Admitting: Obstetrics & Gynecology

## 2019-10-28 MED ORDER — ALBUTEROL SULFATE HFA 108 (90 BASE) MCG/ACT IN AERS: 1.0000 | INHALATION_SPRAY | Freq: Four times a day (QID) | RESPIRATORY_TRACT | 11 refills | Status: DC | PRN

## 2019-10-28 NOTE — Telephone Encounter (Addendum)
Pt is requesting a refill on her Albuterol inhaler. Thanks!! [place]

## 2019-10-28 NOTE — Telephone Encounter (Signed)
done

## 2019-11-17 IMAGING — DX DG CHEST 2V
2 series · 2 of 2 positions shown · non-contrast
Comparison: 04/03/2018

CLINICAL DATA: Productive cough

EXAM:
CHEST - 2 VIEW

[chest pa]
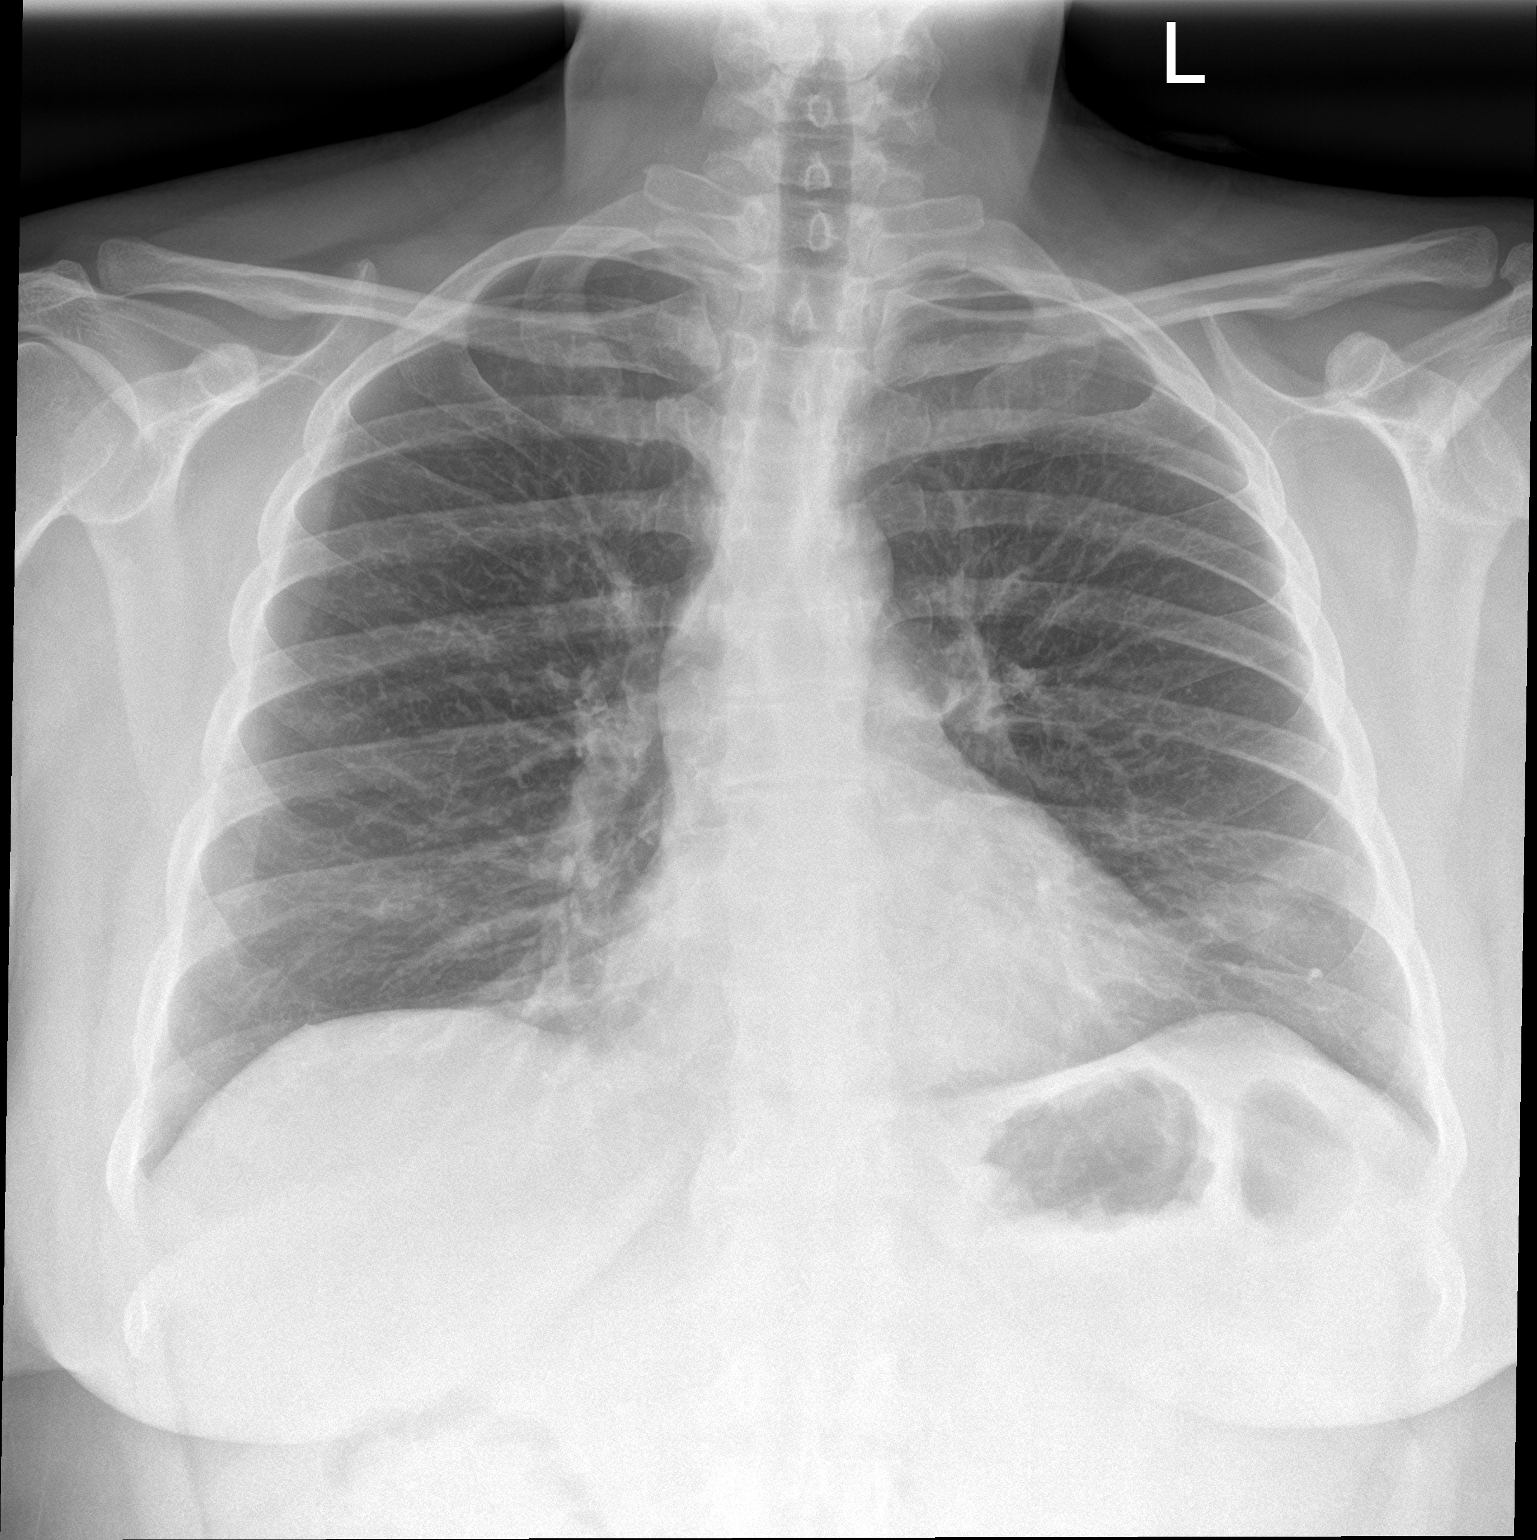

[chest lat]
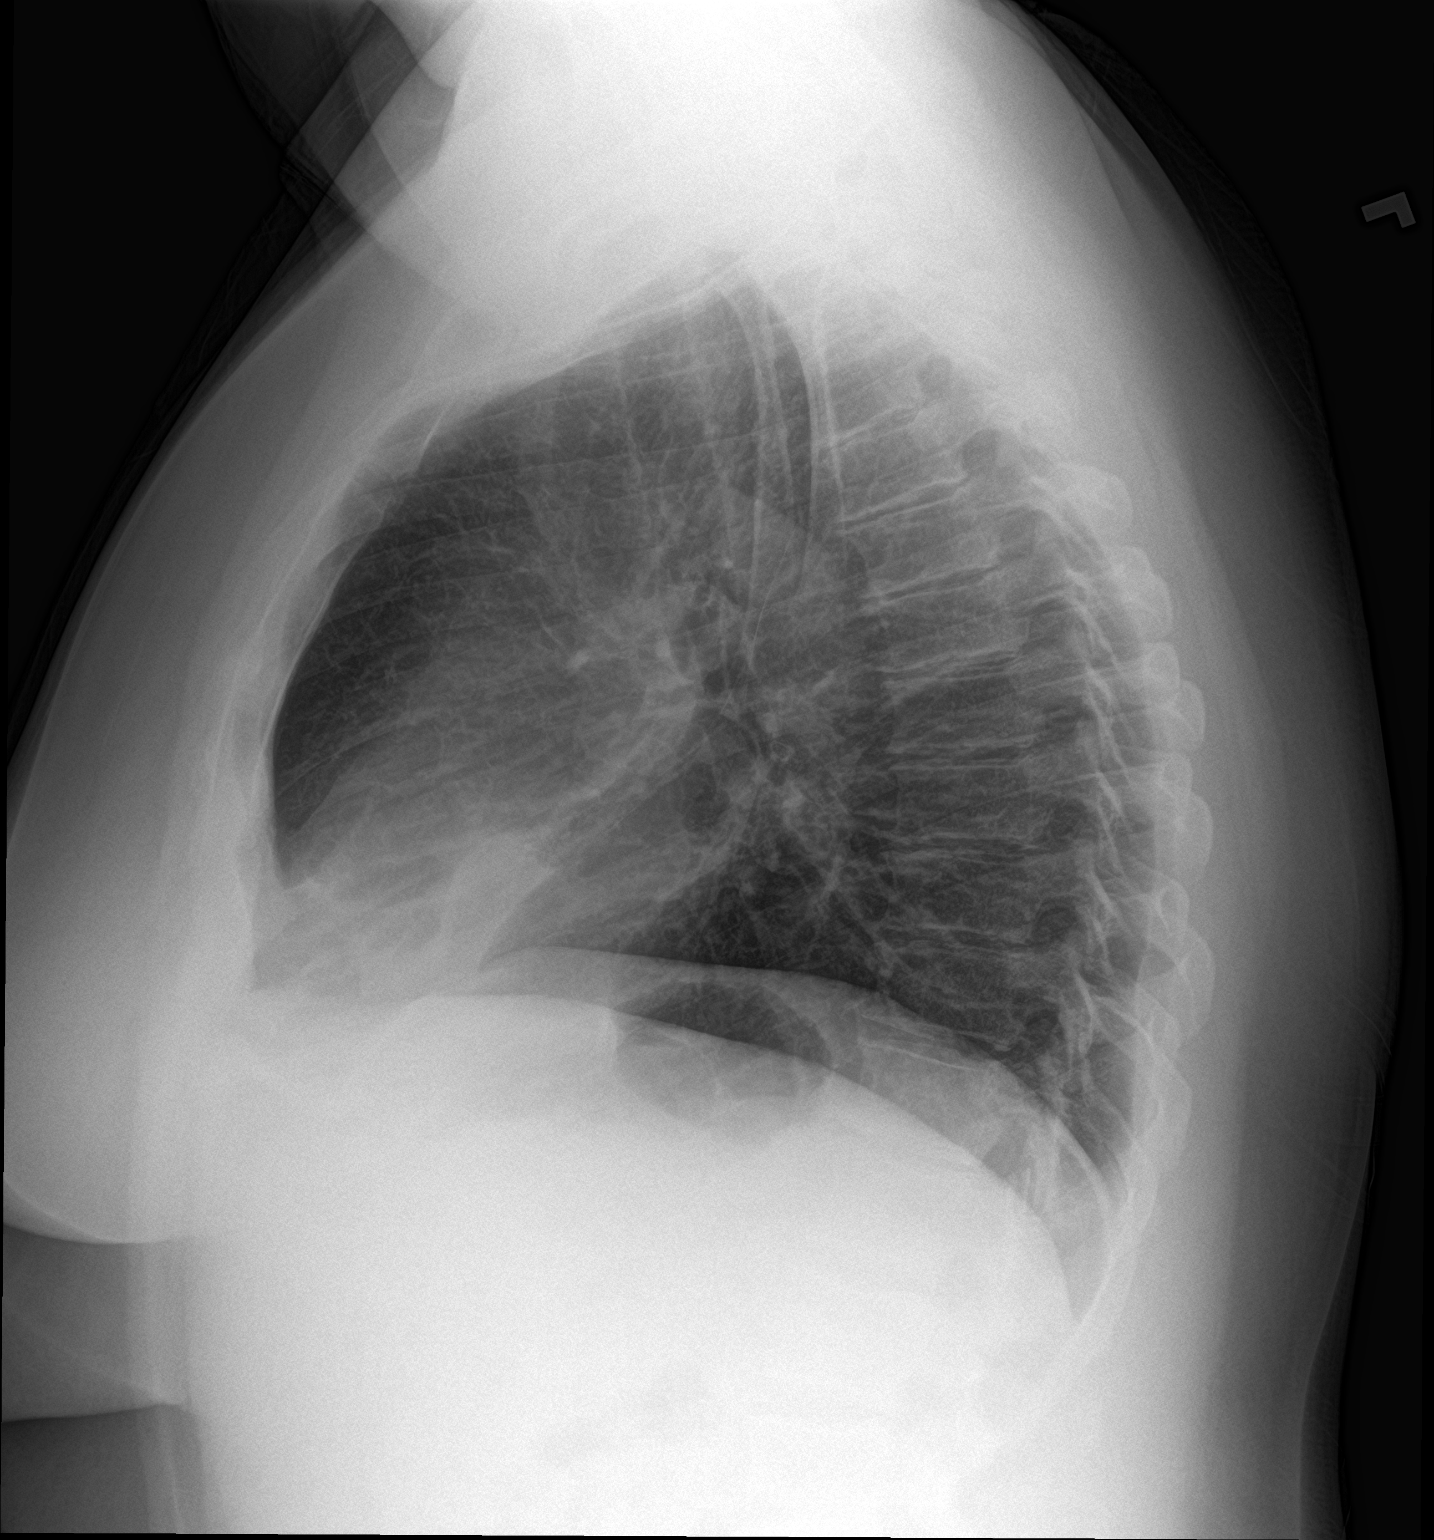

[2 of 2 positions shown; findings below may reference images not displayed]

FINDINGS: The heart size and mediastinal contours are within normal limits.
Both lungs are clear. The visualized skeletal structures are
unremarkable.
IMPRESSION: No active cardiopulmonary disease.

## 2019-12-06 ENCOUNTER — Ambulatory Visit
Admission: EM | Admit: 2019-12-06 | Discharge: 2019-12-06 | Disposition: A | Discharge status: Home / Self Care | Payer: Medicaid Other | Attending: Emergency Medicine | Admitting: Emergency Medicine

## 2019-12-06 ENCOUNTER — Other Ambulatory Visit: Payer: Self-pay

## 2019-12-06 DIAGNOSIS — R05 Cough: Secondary | ICD-10-CM | POA: Diagnosis not present

## 2019-12-06 DIAGNOSIS — Z20822 Contact with and (suspected) exposure to covid-19: Secondary | ICD-10-CM

## 2019-12-06 DIAGNOSIS — R638 Other symptoms and signs concerning food and fluid intake: Secondary | ICD-10-CM | POA: Diagnosis not present

## 2019-12-06 DIAGNOSIS — R438 Other disturbances of smell and taste: Secondary | ICD-10-CM

## 2019-12-06 DIAGNOSIS — U071 COVID-19: Secondary | ICD-10-CM

## 2019-12-06 DIAGNOSIS — Z20828 Contact with and (suspected) exposure to other viral communicable diseases: Secondary | ICD-10-CM | POA: Diagnosis not present

## 2019-12-06 LAB — POC SARS CORONAVIRUS 2 AG -  ED: SARS Coronavirus 2 Ag: POSITIVE — AB

## 2019-12-06 MED ORDER — FLUTICASONE PROPIONATE 50 MCG/ACT NA SUSP: 1.0000 | Freq: Every day | NASAL | 0 refills | Status: DC

## 2019-12-06 MED ORDER — BENZONATATE 100 MG PO CAPS: 100.0000 mg | ORAL_CAPSULE | Freq: Three times a day (TID) | ORAL | 0 refills | Status: DC

## 2019-12-06 NOTE — ED Triage Notes (Signed)
Pt presents to UC w/ c/o cough, decreased appetite, loss of taste and smell x3 days.

## 2019-12-06 NOTE — ED Provider Notes (Signed)
[facility]    CSN: [id number] Arrival date & time: 12/06/19  1719      History   Chief Complaint Chief Complaint  Patient presents with  . loss of taste/smell    HPI Destiny Petty is a 38 y.o. female.   Destiny Petty 38 years old female presented to the urgent care with a complaint of loss of taste and smell, cough and decreased appetite for 3 days .  Denies sick exposure to COVID, flu or strep.  Denies recent travel.  Denies aggravating or alleviating symptoms.  Denies previous COVID infection.   Denies fever, chills, fatigue, nasal congestion, rhinorrhea, sore throat,  SOB, wheezing, chest pain, nausea, vomiting, changes in bowel or bladder habits.       Past Medical History:  Diagnosis Date  . Allergy    SEASONAL  . 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 Anxiety   . Asthma   . Back pain with right-sided sciatica 06/18/2016  . Body mass index 36.0-36.9, adult 04/10/2016  . Body mass index 37.0-37.9, adult 03/13/2016  . Depression   . Dysmenorrhea 04/10/2016  . Family history of adverse reaction to anesthesia    PONV  . GERD (gastroesophageal reflux disease)   . Hemorrhoids 02/10/2015  . Hiatal hernia   . Hyperlipidemia 04/09/2011  . Hypertension   . Menorrhagia with regular cycle 04/10/2016  . Muscle spasm of back 03/13/2016  . Obesity   . Ovarian cyst 12/18/2016  . Perianal wart 02/10/2015  . Stress 02/14/2016  . Weight loss counseling, encounter for 03/13/2016    Patient Active Problem List   Diagnosis Date Noted  . Ovarian cyst 12/18/2016  . 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 Numbness and tingling in left hand 08/19/2016  . Back pain with right-sided sciatica 06/18/2016  . Menorrhagia with regular cycle 04/10/2016  . Dysmenorrhea 04/10/2016  . Muscle spasm of back 03/13/2016  . Obesity 03/13/2016  . Weight loss counseling, encounter for 03/13/2016  . Stress 02/14/2016  . Bipolar disorder (HCC) 01/15/2016  . Hiatal hernia 01/15/2016  .  Migraine 01/15/2016  . Tobacco use disorder 07/10/2013  . GERD (gastroesophageal reflux disease) 07/10/2013  . HTN (hypertension) 07/08/2013  . Asthma, mild persistent 04/05/2013  . Hyperlipidemia 04/09/2011  . Depressive disorder, not elsewhere classified 03/27/2007  . Allergic rhinitis 08/16/2003    Past Surgical History:  Procedure Laterality Date  . ABLATION    . CESAREAN SECTION    . CHOLECYSTECTOMY    . COLONOSCOPY    . DILATATION AND CURETTAGE/HYSTEROSCOPY WITH MINERVA N/A 08/31/2019   Procedure: DILATATION AND CURETTAGE (no specimen) /HYSTEROSCOPY WITH MINERVA ENDOMETRIAL ABLATION;  Surgeon: Tilda Burrow, MD;  Location: [facility];  Service: Gynecology;  Laterality: N/A;  . ESOPHAGOGASTRODUODENOSCOPY ENDOSCOPY    . TUBAL LIGATION      OB History    Gravida  2   Para  2   Term  2   Preterm      AB      Living  2     SAB      TAB      Ectopic      Multiple      Live Births  2            Home Medications    Prior to Admission medications   Medication Sig Start Date End Date Taking? Authorizing Provider  acetaminophen (TYLENOL) 500 MG tablet Take 1,000 mg by mouth every 6 (six) hours as needed  for mild pain, moderate pain or headache.    [provider]  albuterol (VENTOLIN HFA) 108 (90 Base) MCG/ACT inhaler Inhale 1-2 puffs into the lungs every 6 (six) hours as needed for wheezing or shortness of breath. 10/28/19   Lazaro ArmsEure, Luther H, MD  azithromycin (ZITHROMAX) 250 MG tablet Take first 2 tablets together, then 1 every day until finished. Patient not taking: Reported on 09/21/2019 09/11/19   Triplett, Tammy, PA-C  benzonatate (TESSALON) 200 MG capsule Take 1 capsule (200 mg total) by mouth 3 (three) times daily as needed for cough. Patient not taking: Reported on 09/21/2019 09/11/19   Triplett, Babette Relicammy, PA-C  HYDROcodone-acetaminophen (NORCO/VICODIN) 5-325 MG tablet Take 1 tablet by mouth every 6 (six) hours as needed for moderate pain. May take  with ibuprofen Patient not taking: Reported on 09/21/2019 08/31/19   Tilda BurrowFerguson, John V, MD  ketorolac (TORADOL) 10 MG tablet Take 1 tablet (10 mg total) by mouth every 6 (six) hours. For up to 5 days Patient not taking: Reported on 09/21/2019 08/31/19   Tilda BurrowFerguson, John V, MD    Family History Family History  Problem Relation Age of Onset  . Diabetes Mother   . Kidney failure Mother   . Early death Mother 7549       diabetes complications  . Cancer Father 11060       colon cancer  . Depression Sister   . Hypertension Sister   . Anxiety disorder Sister   . Other Sister        back problems  . ADD / ADHD Daughter   . Other Daughter        behavioral issues  . ODD Daughter   . Asthma Son   . Cancer Maternal Grandmother        breast  . Seizures Maternal Grandmother   . Lupus Other     Social History Social History   Tobacco Use  . Smoking status: Current Every Day Smoker    Packs/day: 1.00    Years: 20.00    Pack years: 20.00    Types: Cigarettes  . Smokeless tobacco: Never Used  . Tobacco comment: trying to quit  Substance Use Topics  . Alcohol use: Yes    Comment: occ  . Drug use: No     Allergies   Latex and Lisinopril   Review of Systems Review of Systems  Constitutional: Positive for appetite change.  HENT: Negative.   Respiratory: Positive for cough.   Cardiovascular: Negative.   Gastrointestinal: Negative.   Neurological: Negative.        Loss of taste and smell     Physical Exam Triage Vital Signs ED Triage Vitals  Enc Vitals Group     BP 12/06/19 1755 (!) 149/100     Pulse Rate 12/06/19 1755 84     Resp 12/06/19 1755 15     Temp 12/06/19 1755 98.3 F (36.8 C)     Temp Source 12/06/19 1755 Oral     SpO2 12/06/19 1755 97 %     Weight --      Height --      Head Circumference --      Peak Flow --      Pain Score 12/06/19 1808 0     Pain Loc --      Pain Edu? --      Excl. in GC? --    No data found.  Updated Vital Signs BP (!) 149/100  (BP Location: Right Arm)  Pulse 84   Temp 98.3 F (36.8 C) (Oral)   Resp 15   SpO2 97%   Visual Acuity Right Eye Distance:   Left Eye Distance:   Bilateral Distance:    Right Eye Near:   Left Eye Near:    Bilateral Near:     Physical Exam Nursing note reviewed.  Constitutional:      General: She is not in acute distress.    Appearance: Normal appearance. She is normal weight. She is not ill-appearing or toxic-appearing.  HENT:     Head: Normocephalic.     Right Ear: Tympanic membrane, ear canal and external ear normal. There is no impacted cerumen.     Left Ear: Tympanic membrane, ear canal and external ear normal. There is no impacted cerumen.     Nose: Nose normal. No congestion.     Mouth/Throat:     Mouth: Mucous membranes are moist.     Pharynx: No oropharyngeal exudate or posterior oropharyngeal erythema.  Cardiovascular:     Rate and Rhythm: Normal rate and regular rhythm.     Pulses: Normal pulses.     Heart sounds: Normal heart sounds. No murmur.  Pulmonary:     Effort: Pulmonary effort is normal. No respiratory distress.     Breath sounds: No wheezing or rhonchi.  Chest:     Chest wall: No tenderness.  Abdominal:     General: Abdomen is flat. Bowel sounds are normal. There is no distension.     Palpations: There is no mass.  Skin:    Capillary Refill: Capillary refill takes less than 2 seconds.  Neurological:     Mental Status: She is alert and oriented to person, place, and time.      UC Treatments / Results  Labs (all labs ordered are listed, but only abnormal results are displayed) Labs Reviewed  POC SARS CORONAVIRUS 2 AG -  ED    EKG   Radiology No results found.  Procedures Procedures (including critical care time)  Medications Ordered in UC Medications - No data to display  Initial Impression / Assessment and Plan / UC Course  I have reviewed the triage vital signs and the nursing notes.  Pertinent labs & imaging results that  were available during my care of the patient were reviewed by me and considered in my medical decision making (see chart for details).   Your point-of-care COVID-19 test was positive.  Patient stable for discharge.  Benign physical exam.  Tessalon Perles prescribed to manage cough.  Advised patient to use Flonase for congestion.  To go to ED for worsening of symptoms. Patient verbalized understanding of the plan of care.   Final Clinical Impressions(s) / UC Diagnoses   Final diagnoses:  COVID-19 virus infection     Discharge Instructions     COVID test was positive You should remain isolated in your home for 10 days from symptom onset  Get plenty of rest and push fluids Use flonase for nasal congestion and runny nose Use medications daily for symptom relief Use OTC medications like ibuprofen or tylenol as needed fever or pain Follow up with PCP in 1-2 days via phone or e-visit for recheck and to ensure symptoms are improving Call or go to the ED if you have any new or worsening symptoms such as fever, worsening cough, shortness of breath, chest tightness, chest pain, turning blue, changes in mental status, etc...      ED Prescriptions    None  PDMP not reviewed this encounter.   Emerson Monte, [place] 12/06/19 1843

## 2019-12-06 NOTE — Discharge Instructions (Addendum)
COVID test was positive You should remain isolated in your home for 10 days from symptom onset  Get plenty of rest and push fluids Use flonase for nasal congestion and runny nose Use medications daily for symptom relief Use OTC medications like ibuprofen or tylenol as needed fever or pain Follow up with PCP in 1-2 days via phone or e-visit for recheck and to ensure symptoms are improving Call or go to the ED if you have any new or worsening symptoms such as fever, worsening cough, shortness of breath, chest tightness, chest pain, turning blue, changes in mental status, etc..[name]

## 2019-12-07 ENCOUNTER — Telehealth: Payer: Self-pay | Admitting: Emergency Medicine

## 2019-12-07 MED ORDER — ALBUTEROL SULFATE HFA 108 (90 BASE) MCG/ACT IN AERS: 1.0000 | INHALATION_SPRAY | Freq: Four times a day (QID) | RESPIRATORY_TRACT | 0 refills | Status: DC | PRN

## 2019-12-07 NOTE — Telephone Encounter (Signed)
Patient would like her Pro Air refilled. Medication was sent by Mylinda Latina NP

## 2020-01-03 ENCOUNTER — Ambulatory Visit
Admission: EM | Admit: 2020-01-03 | Discharge: 2020-01-03 | Disposition: A | Discharge status: Home / Self Care | Payer: Medicaid Other | Attending: Emergency Medicine | Admitting: Emergency Medicine

## 2020-01-03 ENCOUNTER — Other Ambulatory Visit: Payer: Self-pay

## 2020-01-03 DIAGNOSIS — H9203 Otalgia, bilateral: Secondary | ICD-10-CM | POA: Insufficient documentation

## 2020-01-03 DIAGNOSIS — J029 Acute pharyngitis, unspecified: Secondary | ICD-10-CM | POA: Insufficient documentation

## 2020-01-03 LAB — POCT RAPID STREP A (OFFICE): Rapid Strep A Screen: NEGATIVE

## 2020-01-03 MED ORDER — CETIRIZINE-PSEUDOEPHEDRINE ER 5-120 MG PO TB12: 1.0000 | ORAL_TABLET | Freq: Every day | ORAL | 0 refills | Status: DC

## 2020-01-03 NOTE — ED Provider Notes (Signed)
[facility]    CSN: [id number] Arrival date & time: 01/03/20  1715      History   Chief Complaint Chief Complaint  Patient presents with  . Sore Throat  . Otalgia    HPI Destiny Petty is a 39 y.o. female.   Presented to urgent care with a complaint of sore throat and bilateral earache for the past 4 days.  Denies sick exposure to COVID, flu or strep.  Denies recent travel.  Denies aggravating or alleviating symptoms.  Report previous COVID infection.   Denies fever, chills, fatigue, nasal congestion, rhinorrhea,  cough, SOB, wheezing, chest pain, nausea, vomiting, changes in bowel or bladder habits.    The history is provided by the patient. No language interpreter was used.  Sore Throat  Otalgia Associated symptoms: sore throat     Past Medical History:  Diagnosis Date  . Allergy    SEASONAL  . 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 Anxiety   . Asthma   . Back pain with right-sided sciatica 06/18/2016  . Body mass index 36.0-36.9, adult 04/10/2016  . Body mass index 37.0-37.9, adult 03/13/2016  . Depression   . Dysmenorrhea 04/10/2016  . Family history of adverse reaction to anesthesia    PONV  . GERD (gastroesophageal reflux disease)   . Hemorrhoids 02/10/2015  . Hiatal hernia   . Hyperlipidemia 04/09/2011  . Hypertension   . Menorrhagia with regular cycle 04/10/2016  . Muscle spasm of back 03/13/2016  . Obesity   . Ovarian cyst 12/18/2016  . Perianal wart 02/10/2015  . Stress 02/14/2016  . Weight loss counseling, encounter for 03/13/2016    Patient Active Problem List   Diagnosis Date Noted  . Ovarian cyst 12/18/2016  . 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 Numbness and tingling in left hand 08/19/2016  . Back pain with right-sided sciatica 06/18/2016  . Menorrhagia with regular cycle 04/10/2016  . Dysmenorrhea 04/10/2016  . Muscle spasm of back 03/13/2016  . Obesity 03/13/2016  . Weight loss counseling, encounter for 03/13/2016  . Stress  02/14/2016  . Bipolar disorder (HCC) 01/15/2016  . Hiatal hernia 01/15/2016  . Migraine 01/15/2016  . Tobacco use disorder 07/10/2013  . GERD (gastroesophageal reflux disease) 07/10/2013  . HTN (hypertension) 07/08/2013  . Asthma, mild persistent 04/05/2013  . Hyperlipidemia 04/09/2011  . Depressive disorder, not elsewhere classified 03/27/2007  . Allergic rhinitis 08/16/2003    Past Surgical History:  Procedure Laterality Date  . ABLATION    . CESAREAN SECTION    . CHOLECYSTECTOMY    . COLONOSCOPY    . DILATATION AND CURETTAGE/HYSTEROSCOPY WITH MINERVA N/A 08/31/2019   Procedure: DILATATION AND CURETTAGE (no specimen) /HYSTEROSCOPY WITH MINERVA ENDOMETRIAL ABLATION;  Surgeon: Tilda Burrow, MD;  Location: [facility];  Service: Gynecology;  Laterality: N/A;  . ESOPHAGOGASTRODUODENOSCOPY ENDOSCOPY    . TUBAL LIGATION      OB History    Gravida  2   Para  2   Term  2   Preterm      AB      Living  2     SAB      TAB      Ectopic      Multiple      Live Births  2            Home Medications    Prior to Admission medications   Medication Sig Start Date End Date Taking? Authorizing Provider  acetaminophen (TYLENOL) 500  MG tablet Take 1,000 mg by mouth every 6 (six) hours as needed for mild pain, moderate pain or headache.    [provider]  albuterol (VENTOLIN HFA) 108 (90 Base) MCG/ACT inhaler Inhale 1-2 puffs into the lungs every 6 (six) hours as needed for wheezing or shortness of breath. 10/28/19   Florian Buff, MD  albuterol (VENTOLIN HFA) 108 (90 Base) MCG/ACT inhaler Inhale 1-2 puffs into the lungs every 6 (six) hours as needed for wheezing or shortness of breath. 12/07/19   Kaila Devries, Darrelyn Hillock, FNP  azithromycin (ZITHROMAX) 250 MG tablet Take first 2 tablets together, then 1 every day until finished. Patient not taking: Reported on 09/21/2019 09/11/19   Triplett, Tammy, PA-C  benzonatate (TESSALON) 100 MG capsule Take 1 capsule (100 mg total)  by mouth every 8 (eight) hours. 12/06/19   Graden Hoshino, Darrelyn Hillock, FNP  cetirizine-pseudoephedrine (ZYRTEC-D) 5-120 MG tablet Take 1 tablet by mouth daily. 01/03/20   Blaike Newburn, Darrelyn Hillock, FNP  fluticasone (FLONASE) 50 MCG/ACT nasal spray Place 1 spray into both nostrils daily for 14 days. 12/06/19 12/20/19  Khristi Schiller, Darrelyn Hillock, FNP  HYDROcodone-acetaminophen (NORCO/VICODIN) 5-325 MG tablet Take 1 tablet by mouth every 6 (six) hours as needed for moderate pain. May take with ibuprofen Patient not taking: Reported on 09/21/2019 08/31/19   Jonnie Kind, MD  ketorolac (TORADOL) 10 MG tablet Take 1 tablet (10 mg total) by mouth every 6 (six) hours. For up to 5 days Patient not taking: Reported on 09/21/2019 08/31/19   Jonnie Kind, MD    Family History Family History  Problem Relation Age of Onset  . Diabetes Mother   . Kidney failure Mother   . Early death Mother 38       diabetes complications  . Cancer Father 40       colon cancer  . Depression Sister   . Hypertension Sister   . Anxiety disorder Sister   . Other Sister        back problems  . ADD / ADHD Daughter   . Other Daughter        behavioral issues  . ODD Daughter   . Asthma Son   . Cancer Maternal Grandmother        breast  . Seizures Maternal Grandmother   . Lupus Other     Social History Social History   Tobacco Use  . Smoking status: Current Every Day Smoker    Packs/day: 1.00    Years: 20.00    Pack years: 20.00    Types: Cigarettes  . Smokeless tobacco: Never Used  . Tobacco comment: trying to quit  Substance Use Topics  . Alcohol use: Yes    Comment: occ  . Drug use: No     Allergies   Latex and Lisinopril   Review of Systems Review of Systems  HENT: Positive for ear pain and sore throat.      Physical Exam Triage Vital Signs ED Triage Vitals  Enc Vitals Group     BP 01/03/20 1733 138/90     Pulse Rate 01/03/20 1733 86     Resp 01/03/20 1733 18     Temp 01/03/20 1733 98.2 F (36.8 C)      Temp src --      SpO2 01/03/20 1733 94 %     Weight --      Height --      Head Circumference --      Peak Flow --  Pain Score 01/03/20 1731 8     Pain Loc --      Pain Edu? --      Excl. in GC? --    No data found.  Updated Vital Signs BP 138/90   Pulse 86   Temp 98.2 F (36.8 C)   Resp 18   SpO2 94%   Visual Acuity Right Eye Distance:   Left Eye Distance:   Bilateral Distance:    Right Eye Near:   Left Eye Near:    Bilateral Near:     Physical Exam Vitals and nursing note reviewed.  Constitutional:      General: She is not in acute distress.    Appearance: She is well-developed and normal weight. She is not ill-appearing or toxic-appearing.  HENT:     Head: Normocephalic.     Right Ear: Tympanic membrane and ear canal normal. No drainage, swelling or tenderness. No middle ear effusion. Tympanic membrane is not erythematous.     Left Ear: Tympanic membrane and ear canal normal. No drainage, swelling or tenderness.  No middle ear effusion. Tympanic membrane is not erythematous.     Nose: Nose normal. No congestion.     Mouth/Throat:     Mouth: Mucous membranes are moist.     Pharynx: Oropharynx is clear. No oropharyngeal exudate.  Cardiovascular:     Rate and Rhythm: Normal rate and regular rhythm.     Pulses: Normal pulses.     Heart sounds: Normal heart sounds. No murmur. No gallop.   Pulmonary:     Effort: Pulmonary effort is normal. No respiratory distress.     Breath sounds: Normal breath sounds. No wheezing.  Chest:     Chest wall: No tenderness.  Abdominal:     General: Abdomen is flat. Bowel sounds are normal. There is no distension.     Palpations: There is no mass.     Tenderness: There is no abdominal tenderness. There is no right CVA tenderness, left CVA tenderness or rebound.     Hernia: No hernia is present.  Neurological:     Mental Status: She is alert.      UC Treatments / Results  Labs (all labs ordered are listed, but only  abnormal results are displayed) Labs Reviewed  CULTURE, GROUP A STREP [facility])  POCT RAPID STREP A (OFFICE)    EKG   Radiology No results found.  Procedures Procedures (including critical care time)  Medications Ordered in UC Medications - No data to display  Initial Impression / Assessment and Plan / UC Course  I have reviewed the triage vital signs and the nursing notes.  Pertinent labs & imaging results that were available during my care of the patient were reviewed by me and considered in my medical decision making (see chart for details).   Patient is stable for discharge Zyrtec D prescribed for sore throat To continue to use Flonase for ear pain To return for worsening of symptoms Patient verbalized understand the plan of care  Final Clinical Impressions(s) / UC Diagnoses   Final diagnoses:  Sore throat  Otalgia of both ears     Discharge Instructions     Strep test negative, will send out for culture and we will call you with results Get plenty of rest and push fluids Zyrtec D prescribed. Use daily for symptomatic relief Continue to use Flonase Drink warm or cool liquids, use throat lozenges, or popsicles to help alleviate symptoms Take OTC ibuprofen or tylenol as  needed for pain Follow up with PCP if symptoms persists Return or go to ER if patient has any new or worsening symptoms such as fever, chills, nausea, vomiting, worsening sore throat, cough, abdominal pain, chest pain, changes in bowel or bladder habits, etc...  Reviewed expectations re: course of current medical issues. Questions answered. Outlined signs and symptoms indicating need for more acute intervention. Patient verbalized understanding. After Visit Summary given.    ED Prescriptions    Medication Sig Dispense Auth. Provider   cetirizine-pseudoephedrine (ZYRTEC-D) 5-120 MG tablet Take 1 tablet by mouth daily. 30 tablet Tommy Goostree, Zachery Dakins, FNP     PDMP not reviewed this encounter.     Durward Parcel, FNP 01/03/20 1811

## 2020-01-03 NOTE — ED Triage Notes (Signed)
Pt presents with sore throat  And ear ache for past few days

## 2020-01-03 NOTE — Discharge Instructions (Addendum)
Strep test negative, will send out for culture and we will call you with results Get plenty of rest and push fluids Zyrtec D prescribed. Use daily for symptomatic relief Continue to use Flonase Drink warm or cool liquids, use throat lozenges, or popsicles to help alleviate symptoms Take OTC ibuprofen or tylenol as needed for pain Follow up with PCP if symptoms persists Return or go to ER if patient has any new or worsening symptoms such as fever, chills, nausea, vomiting, worsening sore throat, cough, abdominal pain, chest pain, changes in bowel or bladder habits, etc...  Reviewed expectations re: course of current medical issues. Questions answered. Outlined signs and symptoms indicating need for more acute intervention. Patient verbalized understanding. After Visit Summary given.

## 2020-01-06 ENCOUNTER — Other Ambulatory Visit: Payer: Self-pay

## 2020-01-06 ENCOUNTER — Ambulatory Visit (INDEPENDENT_AMBULATORY_CARE_PROVIDER_SITE_OTHER): Payer: Medicaid Other

## 2020-01-06 ENCOUNTER — Ambulatory Visit
Admission: EM | Admit: 2020-01-06 | Discharge: 2020-01-06 | Disposition: A | Discharge status: Home / Self Care | Payer: Medicaid Other | Attending: Emergency Medicine | Admitting: Emergency Medicine

## 2020-01-06 DIAGNOSIS — R0602 Shortness of breath: Secondary | ICD-10-CM

## 2020-01-06 DIAGNOSIS — R059 Cough, unspecified: Secondary | ICD-10-CM

## 2020-01-06 DIAGNOSIS — J441 Chronic obstructive pulmonary disease with (acute) exacerbation: Secondary | ICD-10-CM | POA: Diagnosis not present

## 2020-01-06 DIAGNOSIS — R05 Cough: Secondary | ICD-10-CM

## 2020-01-06 DIAGNOSIS — R062 Wheezing: Secondary | ICD-10-CM

## 2020-01-06 LAB — CULTURE, GROUP A STREP (THRC)

## 2020-01-06 MED ORDER — PREDNISONE 10 MG (21) PO TBPK: ORAL_TABLET | Freq: Every day | ORAL | 0 refills | Status: DC

## 2020-01-06 MED ORDER — AZITHROMYCIN 250 MG PO TABS: 250.0000 mg | ORAL_TABLET | Freq: Every day | ORAL | 0 refills | Status: DC

## 2020-01-06 MED ORDER — DEXAMETHASONE SODIUM PHOSPHATE 10 MG/ML IJ SOLN
10.0000 mg | Freq: Once | INTRAMUSCULAR | Status: AC
  Administered 2020-01-06: 20:00:00 10 mg via INTRAMUSCULAR

## 2020-01-06 MED ORDER — ALBUTEROL SULFATE HFA 108 (90 BASE) MCG/ACT IN AERS: 1.0000 | INHALATION_SPRAY | Freq: Four times a day (QID) | RESPIRATORY_TRACT | 0 refills | Status: DC | PRN

## 2020-01-06 NOTE — ED Triage Notes (Signed)
pts cough and sore throat is worsening, pt having chest tightness and sob

## 2020-01-06 NOTE — ED Provider Notes (Signed)
[facility]   [id number] 01/06/20 Arrival Time: 1836  Cc: COUGH  SUBJECTIVE:  Destiny Petty is a 39 y.o. female hx significant for asthma and COPD, who presents with worsening sore throat, chest tightness, SOB, wheezing, and dry cough x 1 week.  Tested positive for COVID on 12/06/19.  Seen two days ago for similar symptoms.  Prescribed zyrtec D without relief.  Reports previous symptoms in the past with COPD exacerbation that improved with z-pak and prednisone.  Denies fever, sinus pain/ pressure, chest pain, nausea, changes in bowel or bladder habits.    ROS: As per HPI.  All other pertinent ROS negative.     Past Medical History:  Diagnosis Date  . Allergy    SEASONAL  . 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 Anxiety   . Asthma   . Back pain with right-sided sciatica 06/18/2016  . Body mass index 36.0-36.9, adult 04/10/2016  . Body mass index 37.0-37.9, adult 03/13/2016  . Depression   . Dysmenorrhea 04/10/2016  . Family history of adverse reaction to anesthesia    PONV  . GERD (gastroesophageal reflux disease)   . Hemorrhoids 02/10/2015  . Hiatal hernia   . Hyperlipidemia 04/09/2011  . Hypertension   . Menorrhagia with regular cycle 04/10/2016  . Muscle spasm of back 03/13/2016  . Obesity   . Ovarian cyst 12/18/2016  . Perianal wart 02/10/2015  . Stress 02/14/2016  . Weight loss counseling, encounter for 03/13/2016   Past Surgical History:  Procedure Laterality Date  . ABLATION    . CESAREAN SECTION    . CHOLECYSTECTOMY    . COLONOSCOPY    . DILATATION AND CURETTAGE/HYSTEROSCOPY WITH MINERVA N/A 08/31/2019   Procedure: DILATATION AND CURETTAGE (no specimen) /HYSTEROSCOPY WITH MINERVA ENDOMETRIAL ABLATION;  Surgeon: Tilda Burrow, MD;  Location: [facility];  Service: Gynecology;  Laterality: N/A;  . ESOPHAGOGASTRODUODENOSCOPY ENDOSCOPY    . TUBAL LIGATION     Allergies  Allergen Reactions  . Latex Itching  . Lisinopril Cough    coughing   No current facility-administered  medications on file prior to encounter.   Current Outpatient Medications on File Prior to Encounter  Medication Sig Dispense Refill  . acetaminophen (TYLENOL) 500 MG tablet Take 1,000 mg by mouth every 6 (six) hours as needed for mild pain, moderate pain or headache.    . cetirizine-pseudoephedrine (ZYRTEC-D) 5-120 MG tablet Take 1 tablet by mouth daily. 30 tablet 0  . fluticasone (FLONASE) 50 MCG/ACT nasal spray Place 1 spray into both nostrils daily for 14 days. 16 g 0    Social History   Socioeconomic History  . Marital status: Married    Spouse name: Lars Mage  . Number of children: 2  . Years of education: 17  . Highest education level: Not on file  Occupational History  . Occupation: Print production planner  Tobacco Use  . Smoking status: Current Every Day Smoker    Packs/day: 1.00    Years: 20.00    Pack years: 20.00    Types: Cigarettes  . Smokeless tobacco: Never Used  . Tobacco comment: trying to quit  Substance and Sexual Activity  . Alcohol use: Yes    Comment: occ  . Drug use: No  . Sexual activity: Not Currently    Birth control/protection: Surgical    Comment: tubal  Other Topics Concern  . Not on file  Social History Narrative   Married   3 children at home   Step son is 7   Social  Determinants of Health   Financial Resource Strain:   . Difficulty of Paying Living Expenses: Not on file  Food Insecurity:   . Worried About Programme researcher, broadcasting/film/video in the Last Year: Not on file  . Ran Out of Food in the Last Year: Not on file  Transportation Needs:   . Lack of Transportation (Medical): Not on file  . Lack of Transportation (Non-Medical): Not on file  Physical Activity:   . Days of Exercise per Week: Not on file  . Minutes of Exercise per Session: Not on file  Stress:   . Feeling of Stress : Not on file  Social Connections:   . Frequency of Communication with Friends and Family: Not on file  . Frequency of Social Gatherings with Friends and Family: Not on file  .  Attends Religious Services: Not on file  . Active Member of Clubs or Organizations: Not on file  . Attends Banker Meetings: Not on file  . Marital Status: Not on file  Intimate Partner Violence:   . Fear of Current or Ex-Partner: Not on file  . Emotionally Abused: Not on file  . Physically Abused: Not on file  . Sexually Abused: Not on file   Family History  Problem Relation Age of Onset  . Diabetes Mother   . Kidney failure Mother   . Early death Mother 34       diabetes complications  . Cancer Father 26       colon cancer  . Depression Sister   . Hypertension Sister   . Anxiety disorder Sister   . Other Sister        back problems  . ADD / ADHD Daughter   . Other Daughter        behavioral issues  . ODD Daughter   . Asthma Son   . Cancer Maternal Grandmother        breast  . Seizures Maternal Grandmother   . Lupus Other      OBJECTIVE:  Vitals:   01/06/20 1849  BP: (!) 137/99  Pulse: 95  Resp: (!) 22  Temp: 97.9 F (36.6 C)  SpO2: 92%     General appearance: Alert, appears fatigued, but nontoxic; speaking in full sentences without difficulty HEENT:NCAT; Ears: EACs clear, TMs pearly gray; Eyes: PERRL.  EOM grossly intact. Nose: nares patent without rhinorrhea; Throat: tonsils nonerythematous or enlarged, uvula midline  Neck: supple without LAD Lungs: diffuse wheezes heard throughout bilateral chest; normal respiratory effort; mild cough present Heart: regular rate and rhythm.   Skin: warm and dry Psychological: alert and cooperative; normal mood and affect  DIAGNOSTIC STUDIES:  DG Chest 2 View  Result Date: 01/06/2020 CLINICAL DATA:  Worsening cough EXAM: CHEST - 2 VIEW COMPARISON:  01/26/2019 FINDINGS: Heart and mediastinal contours are within normal limits. No focal opacities or effusions. No acute bony abnormality. IMPRESSION: No active cardiopulmonary disease. Electronically Signed   By: Charlett Nose M.D.   On: 01/06/2020 19:07    X-rays  negative for cardiopulmonary disease.    I have reviewed the x-rays myself and the radiologist interpretation. I am in agreement with the radiologist interpretation.     ASSESSMENT & PLAN:  1. COPD exacerbation (HCC)   2. Cough   3. Shortness of breath   4. Wheezing     Meds ordered this encounter  Medications  . dexamethasone (DECADRON) injection 10 mg  . albuterol (VENTOLIN HFA) 108 (90 Base) MCG/ACT inhaler  Sig: Inhale 1 puff into the lungs every 6 (six) hours as needed for wheezing or shortness of breath.    Dispense:  18 g    Refill:  0    Order Specific Question:   Supervising Provider    Answer:   Raylene Everts [[id number]]  . predniSONE (STERAPRED UNI-PAK 21 TAB) 10 MG (21) TBPK tablet    Sig: Take by mouth daily. Take 6 tabs by mouth daily  for 2 days, then 5 tabs for 2 days, then 4 tabs for 2 days, then 3 tabs for 2 days, 2 tabs for 2 days, then 1 tab by mouth daily for 2 days    Dispense:  42 tablet    Refill:  0    Order Specific Question:   Supervising Provider    Answer:   Raylene Everts [[id number]]  . azithromycin (ZITHROMAX) 250 MG tablet    Sig: Take 1 tablet (250 mg total) by mouth daily. Take first 2 tablets together, then 1 every day until finished.    Dispense:  6 tablet    Refill:  0    Order Specific Question:   Supervising Provider    Answer:   Raylene Everts [[id number]]    Orders Placed This Encounter  Procedures  . DG Chest 2 View    Standing Status:   Standing    Number of Occurrences:   1    Order Specific Question:   Reason for Exam (SYMPTOM  OR DIAGNOSIS REQUIRED)    Answer:   worsening cough; dx'ed covid 12/06/19    X-ray negative for cardiopulmonary disease Steroid shot given in office Concern for COPD exacerbation Get plenty of rest and push fluids Prednisone prescribed.  Take as directed and to completion z-pak prescribed.  Take as directed and to completion Proair inhaler prescribed.  Use as needed for shortness of breath  and/or wheezing Follow up with PCP for recheck and/or if symptoms persists Return or go to ER if you have any new or worsening symptoms such as fever, chills, fatigue, shortness of breath, wheezing, chest pain, nausea, changes in bowel or bladder habits, etc...  Reviewed expectations re: course of current medical issues. Questions answered. Outlined signs and symptoms indicating need for more acute intervention. Patient verbalized understanding. After Visit Summary given.          Lestine Box, PA-C 01/06/20 1926

## 2020-01-06 NOTE — Discharge Instructions (Signed)
X-ray negative for cardiopulmonary disease Steroid shot given in office Concern for COPD exacerbation Get plenty of rest and push fluids Prednisone prescribed.  Take as directed and to completion z-pak prescribed.  Take as directed and to completion Proair inhaler prescribed.  Use as needed for shortness of breath and/or wheezing Follow up with PCP for recheck and/or if symptoms persists Return or go to ER if you have any new or worsening symptoms such as fever, chills, fatigue, shortness of breath, wheezing, chest pain, nausea, changes in bowel or bladder habits, etc..[name]

## 2020-02-28 ENCOUNTER — Other Ambulatory Visit: Payer: Self-pay

## 2020-02-28 ENCOUNTER — Ambulatory Visit
Admission: EM | Admit: 2020-02-28 | Discharge: 2020-02-28 | Disposition: A | Discharge status: Home / Self Care | Payer: Medicaid Other | Attending: Emergency Medicine | Admitting: Emergency Medicine

## 2020-02-28 DIAGNOSIS — J4541 Moderate persistent asthma with (acute) exacerbation: Secondary | ICD-10-CM

## 2020-02-28 DIAGNOSIS — J441 Chronic obstructive pulmonary disease with (acute) exacerbation: Secondary | ICD-10-CM

## 2020-02-28 MED ORDER — ALBUTEROL SULFATE HFA 108 (90 BASE) MCG/ACT IN AERS: 1.0000 | INHALATION_SPRAY | Freq: Four times a day (QID) | RESPIRATORY_TRACT | 1 refills | Status: DC | PRN

## 2020-02-28 MED ORDER — DEXAMETHASONE SODIUM PHOSPHATE 10 MG/ML IJ SOLN
10.0000 mg | Freq: Once | INTRAMUSCULAR | Status: AC
  Administered 2020-02-28: 14:00:00 10 mg via INTRAMUSCULAR

## 2020-02-28 MED ORDER — PREDNISONE 10 MG (21) PO TBPK: ORAL_TABLET | ORAL | 0 refills | Status: DC

## 2020-02-28 MED ORDER — AZITHROMYCIN 250 MG PO TABS: 250.0000 mg | ORAL_TABLET | Freq: Every day | ORAL | 0 refills | Status: DC

## 2020-02-28 NOTE — ED Triage Notes (Signed)
Pt has had increased cough and asthma flare for past week and also has had headache and diarrhea for past 3 day

## 2020-02-28 NOTE — Discharge Instructions (Addendum)
Decadron IM shot was given in office ProAir was prescribed/ advised patient to use as prescribed Prednisone was prescribed /take as directed Azithromycin was prescribed /take as directed Follow-up with primary care  Return for worsening of symptoms

## 2020-02-28 NOTE — ED Provider Notes (Signed)
[facility]    CSN: [id number] Arrival date & time: 02/28/20  1246      History   Chief Complaint Chief Complaint  Patient presents with  . Cough  . Asthma    HPI Destiny Petty is a 39 y.o. female.   With history of asthma, and COPD presented to the urgent care for complaint of  shortness of breath,  and dry cough for the past 3 days.  Tested positive for COVID-19 on 12/06/2019. Denies sick exposure to COVID, flu or strep.  Denies recent travel.  Denies aggravating or alleviating symptoms.   Denies fever, chills, fatigue, nasal congestion, rhinorrhea, sore throat, wheezing, chest pain, nausea, vomiting, changes in bowel or bladder habits.     Cough Associated symptoms: shortness of breath   Asthma Associated symptoms include shortness of breath.    Past Medical History:  Diagnosis Date  . Allergy    SEASONAL  . 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 Anxiety   . Asthma   . Back pain with right-sided sciatica 06/18/2016  . Body mass index 36.0-36.9, adult 04/10/2016  . Body mass index 37.0-37.9, adult 03/13/2016  . Depression   . Dysmenorrhea 04/10/2016  . Family history of adverse reaction to anesthesia    PONV  . GERD (gastroesophageal reflux disease)   . Hemorrhoids 02/10/2015  . Hiatal hernia   . Hyperlipidemia 04/09/2011  . Hypertension   . Menorrhagia with regular cycle 04/10/2016  . Muscle spasm of back 03/13/2016  . Obesity   . Ovarian cyst 12/18/2016  . Perianal wart 02/10/2015  . Stress 02/14/2016  . Weight loss counseling, encounter for 03/13/2016    Patient Active Problem List   Diagnosis Date Noted  . Ovarian cyst 12/18/2016  . 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 Numbness and tingling in left hand 08/19/2016  . Back pain with right-sided sciatica 06/18/2016  . Menorrhagia with regular cycle 04/10/2016  . Dysmenorrhea 04/10/2016  . Muscle spasm of back 03/13/2016  . Obesity 03/13/2016  . Weight loss counseling, encounter for  03/13/2016  . Stress 02/14/2016  . Bipolar disorder (HCC) 01/15/2016  . Hiatal hernia 01/15/2016  . Migraine 01/15/2016  . Tobacco use disorder 07/10/2013  . GERD (gastroesophageal reflux disease) 07/10/2013  . HTN (hypertension) 07/08/2013  . Asthma, mild persistent 04/05/2013  . Hyperlipidemia 04/09/2011  . Depressive disorder, not elsewhere classified 03/27/2007  . Allergic rhinitis 08/16/2003    Past Surgical History:  Procedure Laterality Date  . ABLATION    . CESAREAN SECTION    . CHOLECYSTECTOMY    . COLONOSCOPY    . DILATATION AND CURETTAGE/HYSTEROSCOPY WITH MINERVA N/A 08/31/2019   Procedure: DILATATION AND CURETTAGE (no specimen) /HYSTEROSCOPY WITH MINERVA ENDOMETRIAL ABLATION;  Surgeon: Tilda Burrow, MD;  Location: [facility];  Service: Gynecology;  Laterality: N/A;  . ESOPHAGOGASTRODUODENOSCOPY ENDOSCOPY    . TUBAL LIGATION      OB History    Gravida  2   Para  2   Term  2   Preterm      AB      Living  2     SAB      TAB      Ectopic      Multiple      Live Births  2            Home Medications    Prior to Admission medications   Medication Sig Start Date End Date Taking? Authorizing Provider  acetaminophen (  TYLENOL) 500 MG tablet Take 1,000 mg by mouth every 6 (six) hours as needed for mild pain, moderate pain or headache.    [provider]  albuterol (VENTOLIN HFA) 108 (90 Base) MCG/ACT inhaler Inhale 1-2 puffs into the lungs every 6 (six) hours as needed for wheezing or shortness of breath. 02/28/20   Alekxander Isola, Zachery Dakins, FNP  azithromycin (ZITHROMAX) 250 MG tablet Take 1 tablet (250 mg total) by mouth daily. Take first 2 tablets together, then 1 every day until finished. 02/28/20   Huan Pollok, Zachery Dakins, FNP  cetirizine-pseudoephedrine (ZYRTEC-D) 5-120 MG tablet Take 1 tablet by mouth daily. 01/03/20   Crysta Gulick, Zachery Dakins, FNP  fluticasone (FLONASE) 50 MCG/ACT nasal spray Place 1 spray into both nostrils daily for 14 days. 12/06/19  12/20/19  Semisi Biela, Zachery Dakins, FNP  predniSONE (STERAPRED UNI-PAK 21 TAB) 10 MG (21) TBPK tablet Take 6 tabs by mouth daily  for 2 days, then 5 tabs for 2 days, then 4 tabs for 2 days, then 3 tabs for 2 days, 2 tabs for 2 days, then 1 tab by mouth daily for 2 days 02/28/20   Durward Parcel, FNP    Family History Family History  Problem Relation Age of Onset  . Diabetes Mother   . Kidney failure Mother   . Early death Mother 66       diabetes complications  . Cancer Father 49       colon cancer  . Depression Sister   . Hypertension Sister   . Anxiety disorder Sister   . Other Sister        back problems  . ADD / ADHD Daughter   . Other Daughter        behavioral issues  . ODD Daughter   . Asthma Son   . Cancer Maternal Grandmother        breast  . Seizures Maternal Grandmother   . Lupus Other     Social History Social History   Tobacco Use  . Smoking status: Current Every Day Smoker    Packs/day: 1.00    Years: 20.00    Pack years: 20.00    Types: Cigarettes  . Smokeless tobacco: Never Used  . Tobacco comment: trying to quit  Substance Use Topics  . Alcohol use: Yes    Comment: occ  . Drug use: No     Allergies   Latex and Lisinopril   Review of Systems Review of Systems  Constitutional: Negative.   HENT: Negative.   Respiratory: Positive for cough and shortness of breath.   Cardiovascular: Negative.   Gastrointestinal: Negative.   Neurological: Negative.      Physical Exam Triage Vital Signs ED Triage Vitals  Enc Vitals Group     BP 02/28/20 1305 129/80     Pulse Rate 02/28/20 1305 77     Resp 02/28/20 1305 18     Temp 02/28/20 1305 98.2 F (36.8 C)     Temp src --      SpO2 02/28/20 1305 94 %     Weight --      Height --      Head Circumference --      Peak Flow --      Pain Score 02/28/20 1304 0     Pain Loc --      Pain Edu? --      Excl. in [place]? --    No data found.  Updated Vital Signs BP 129/80   Pulse  77   Temp 98.2 F  (36.8 C)   Resp 18   SpO2 94%   Visual Acuity Right Eye Distance:   Left Eye Distance:   Bilateral Distance:    Right Eye Near:   Left Eye Near:    Bilateral Near:     Physical Exam Vitals and nursing note reviewed.  Constitutional:      General: She is not in acute distress.    Appearance: Normal appearance. She is normal weight. She is not ill-appearing or toxic-appearing.  HENT:     Head: Normocephalic.     Right Ear: Tympanic membrane, ear canal and external ear normal. There is no impacted cerumen.     Left Ear: Tympanic membrane, ear canal and external ear normal. There is no impacted cerumen.     Nose: Nose normal. No congestion.     Mouth/Throat:     Mouth: Mucous membranes are moist.     Pharynx: Oropharynx is clear. No oropharyngeal exudate or posterior oropharyngeal erythema.  Cardiovascular:     Rate and Rhythm: Normal rate and regular rhythm.     Pulses: Normal pulses.     Heart sounds: Normal heart sounds. No murmur.  Pulmonary:     Effort: Pulmonary effort is normal. No respiratory distress.     Breath sounds: Wheezing present. No rhonchi.  Chest:     Chest wall: No tenderness.  Neurological:     Mental Status: She is alert and oriented to person, place, and time.      UC Treatments / Results  Labs (all labs ordered are listed, but only abnormal results are displayed) Labs Reviewed - No data to display  EKG   Radiology No results found.  Procedures Procedures (including critical care time)  Medications Ordered in UC Medications  dexamethasone (DECADRON) injection 10 mg (10 mg Intramuscular Given 02/28/20 1405)    Initial Impression / Assessment and Plan / UC Course  I have reviewed the triage vital signs and the nursing notes.  Pertinent labs & imaging results that were available during my care of the patient were reviewed by me and considered in my medical decision making (see chart for details).    Patient is stable at  discharge. Symptoms more likely from COPD exacerbation. Decadron IM was given in office. Prednisone and azithromycin was prescribed. ProAir was prescribed and patient was advised to take as directed Follow-up with PCP Return for worsening of symptoms  Final Clinical Impressions(s) / UC Diagnoses   Final diagnoses:  COPD exacerbation ([place])     Discharge Instructions     Decadron IM shot was given in office ProAir was prescribed/ advised patient to use as prescribed Prednisone was prescribed /take as directed Azithromycin was prescribed /take as directed Follow-up with primary care  Return for worsening of symptoms    ED Prescriptions    Medication Sig Dispense Auth. Provider   predniSONE (STERAPRED UNI-PAK 21 TAB) 10 MG (21) TBPK tablet Take 6 tabs by mouth daily  for 2 days, then 5 tabs for 2 days, then 4 tabs for 2 days, then 3 tabs for 2 days, 2 tabs for 2 days, then 1 tab by mouth daily for 2 days 42 tablet Kariem Wolfson S, FNP   azithromycin (ZITHROMAX) 250 MG tablet Take 1 tablet (250 mg total) by mouth daily. Take first 2 tablets together, then 1 every day until finished. 6 tablet Romari Gasparro, Darrelyn Hillock, FNP   albuterol (VENTOLIN HFA) 108 (90 Base) MCG/ACT inhaler Inhale 1-2  puffs into the lungs every 6 (six) hours as needed for wheezing or shortness of breath. 18 g Durward Parcel, FNP     PDMP not reviewed this encounter.   Durward Parcel, FNP 02/28/20 1408

## 2020-05-08 ENCOUNTER — Ambulatory Visit
Admission: EM | Admit: 2020-05-08 | Discharge: 2020-05-08 | Disposition: A | Discharge status: Home / Self Care | Payer: Medicaid Other | Attending: Emergency Medicine | Admitting: Emergency Medicine

## 2020-05-08 DIAGNOSIS — T304 Corrosion of unspecified body region, unspecified degree: Secondary | ICD-10-CM | POA: Diagnosis not present

## 2020-05-08 MED ORDER — SILVER SULFADIAZINE 1 % EX CREA: 1.0000 "application " | TOPICAL_CREAM | Freq: Every day | CUTANEOUS | 0 refills | Status: DC
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
## 2020-05-15 ENCOUNTER — Telehealth: Payer: Self-pay

## 2020-05-15 MED ORDER — SILVER SULFADIAZINE 1 % EX CREA: 1.0000 "application " | TOPICAL_CREAM | Freq: Every day | CUTANEOUS | 0 refills | Status: DC

## 2020-05-26 ENCOUNTER — Ambulatory Visit: Payer: Medicaid Other | Admitting: Family Medicine

## 2020-06-01 ENCOUNTER — Encounter: Payer: Self-pay | Admitting: Family Medicine

## 2020-06-01 ENCOUNTER — Ambulatory Visit (INDEPENDENT_AMBULATORY_CARE_PROVIDER_SITE_OTHER): Payer: Medicaid Other | Admitting: Family Medicine

## 2020-06-01 ENCOUNTER — Other Ambulatory Visit: Payer: Self-pay

## 2020-06-01 VITALS — BP 133/83 | HR 91 | Temp 97.4°F | Ht 60.0 in | Wt 217.8 lb

## 2020-06-01 DIAGNOSIS — F17218 Nicotine dependence, cigarettes, with other nicotine-induced disorders: Secondary | ICD-10-CM | POA: Diagnosis not present

## 2020-06-01 DIAGNOSIS — Z131 Encounter for screening for diabetes mellitus: Secondary | ICD-10-CM | POA: Diagnosis not present

## 2020-06-01 DIAGNOSIS — E782 Mixed hyperlipidemia: Secondary | ICD-10-CM

## 2020-06-01 DIAGNOSIS — M5431 Sciatica, right side: Secondary | ICD-10-CM

## 2020-06-01 DIAGNOSIS — M1712 Unilateral primary osteoarthritis, left knee: Secondary | ICD-10-CM | POA: Insufficient documentation

## 2020-06-01 DIAGNOSIS — I83812 Varicose veins of left lower extremities with pain: Secondary | ICD-10-CM

## 2020-06-01 DIAGNOSIS — Z1231 Encounter for screening mammogram for malignant neoplasm of breast: Secondary | ICD-10-CM

## 2020-06-01 DIAGNOSIS — I10 Essential (primary) hypertension: Secondary | ICD-10-CM | POA: Diagnosis not present

## 2020-06-01 DIAGNOSIS — Z6841 Body Mass Index (BMI) 40.0 and over, adult: Secondary | ICD-10-CM

## 2020-06-01 DIAGNOSIS — J4530 Mild persistent asthma, uncomplicated: Secondary | ICD-10-CM | POA: Diagnosis not present

## 2020-06-01 DIAGNOSIS — Z0001 Encounter for general adult medical examination with abnormal findings: Secondary | ICD-10-CM | POA: Insufficient documentation

## 2020-06-01 LAB — LIPID PANEL
Cholesterol: 217 mg/dL — ABNORMAL HIGH (ref <200)
HDL: 45 mg/dL — ABNORMAL LOW (ref >=50)
LDL Cholesterol (Calc): 140 mg/dL (calc) — ABNORMAL HIGH
Non-HDL Cholesterol (Calc): 172 mg/dL (calc) — ABNORMAL HIGH (ref <130)
Total CHOL/HDL Ratio: 4.8 (calc) (ref <5.0)
Triglycerides: 186 mg/dL — ABNORMAL HIGH (ref <150)

## 2020-06-01 LAB — CBC
HCT: 46.8 % — ABNORMAL HIGH (ref 35.0–45.0)
Hemoglobin: 15.8 g/dL — ABNORMAL HIGH (ref 11.7–15.5)
MCH: 31.9 pg (ref 27.0–33.0)
MCHC: 33.8 g/dL (ref 32.0–36.0)
MCV: 94.4 fL (ref 80.0–100.0)
MPV: 10.7 fL (ref 7.5–12.5)
Platelets: 248 10*3/uL (ref 140–400)
RBC: 4.96 10*6/uL (ref 3.80–5.10)
RDW: 12.3 % (ref 11.0–15.0)
WBC: 9.6 10*3/uL (ref 3.8–10.8)

## 2020-06-01 LAB — COMPLETE METABOLIC PANEL WITH GFR
AG Ratio: 1.9 (calc) (ref 1.0–2.5)
ALT: 12 U/L (ref 6–29)
AST: 14 U/L (ref 10–30)
Albumin: 4.4 g/dL (ref 3.6–5.1)
Alkaline phosphatase (APISO): 96 U/L (ref 31–125)
BUN: 9 mg/dL (ref 7–25)
CO2: 28 mmol/L (ref 20–32)
Calcium: 9.6 mg/dL (ref 8.6–10.2)
Chloride: 106 mmol/L (ref 98–110)
Creat: 0.7 mg/dL (ref 0.50–1.10)
GFR, Est African American: 126 mL/min/{1.73_m2} (ref >=60)
GFR, Est Non African American: 108 mL/min/{1.73_m2} (ref >=60)
Globulin: 2.3 g/dL (calc) (ref 1.9–3.7)
Glucose, Bld: 79 mg/dL (ref 65–99)
Potassium: 4.6 mmol/L (ref 3.5–5.3)
Sodium: 139 mmol/L (ref 135–146)
Total Bilirubin: 0.5 mg/dL (ref 0.2–1.2)
Total Protein: 6.7 g/dL (ref 6.1–8.1)

## 2020-06-01 LAB — POCT GLYCOSYLATED HEMOGLOBIN (HGB A1C)
HbA1c POC (<> result, manual entry): 5.2 % (ref 4.0–5.6)
HbA1c, POC (controlled diabetic range): 5.2 % (ref 0.0–7.0)
HbA1c, POC (prediabetic range): 5.2 % — AB (ref 5.7–6.4)
Hemoglobin A1C: 5.2 % (ref 4.0–5.6)

## 2020-06-01 MED ORDER — PHENTERMINE HCL 37.5 MG PO TABS: 18.7500 mg | ORAL_TABLET | Freq: Every day | ORAL | 0 refills | Status: DC

## 2020-06-01 NOTE — Progress Notes (Signed)
Subjective:  Patient ID: Destiny Petty, female    DOB: Jun 26, 1981  Age: 39 y.o. MRN: [id number]  CC:  Chief Complaint  Patient presents with  . New Patient (Initial Visit)    new pt former dr Delton See pt [place] [address] would like to discuss leg pain right leg sciatic pain left leg fell and has been hurting when she lays down at night they are not hurting right now but when they do pain is at 10 she fell on them 2 years ago and they have been hurting since   . Weight Loss    also would like to discuss weight loss has tried numerous things phentermine is the only thing that helps       HPI  HPI  Is Destiny Petty is a 39 year old female patient she has previously been seen in this office back in 2018 by Dr. Delton See.  Has not been followed by primary care since then as she lost her insurance.  She has several concerns that she would like to have addressed today in the office.  Sciatica of the right leg and back pain, left knee arthritis, left leg vascular changes including varicose veins which she reports she has had stripped in the past.  And weight loss needs.  She reports that she has been on phentermine in the past this did well for her.  And then it stopped doing well for her she is not been on anything for a while.  She has gained up to 280 pounds when she was on prednisone secondary to Covid.  She is just now started to reduce her weight and would like help getting back to where she was.  She has not had insurance and needs to get a mammogram updated.  Is up-to-date on all her vaccines.  Did not get Covid vaccine was positive for Covid back in December 2020.  She reports history of asthma does use inhaler 1-2 times a week.  Does have some wheezing and shortness of breath.  However she smokes 1 pack of cigarettes a day.  She reports that she has had more shortness of breath since having Covid. She denies having any chest pain, headaches, vision changes, leg  swelling, palpitations, dizziness.   Today patient denies signs and symptoms of COVID 19 infection including fever, chills, cough, shortness of breath, and headache. Past Medical, Surgical, Social History, Allergies, and Medications have been Reviewed.   Past Medical History:  Diagnosis Date  . Allergy    SEASONAL  . 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 Anxiety   . Arthritis 12/04/2016   Phreesia 05/23/2020  . Asthma   . Back pain with right-sided sciatica 06/18/2016  . Bipolar disorder (HCC) 01/15/2016   Overview:  Tried Depakote  . Body mass index 36.0-36.9, adult 04/10/2016  . Body mass index 37.0-37.9, adult 03/13/2016  . Depression   . Depressive disorder, not elsewhere classified 03/27/2007  . Dysmenorrhea 04/10/2016  . Family history of adverse reaction to anesthesia    PONV  . GERD (gastroesophageal reflux disease)   . Hemorrhoids 02/10/2015  . Hiatal hernia   . Hyperlipidemia 04/09/2011  . Hypertension   . Menorrhagia with regular cycle 04/10/2016  . Migraine 01/15/2016   Overview:  Overview:  IMO Load 2016 R1.3  . Muscle spasm of back 03/13/2016  . Numbness and tingling in left hand 08/19/2016  . Obesity   . Ovarian cyst 12/18/2016  . Perianal wart 02/10/2015  .  Stress 02/14/2016  . Weight loss counseling, encounter for 03/13/2016    Current Meds  Medication Sig  . acetaminophen (TYLENOL) 500 MG tablet Take 1,000 mg by mouth every 6 (six) hours as needed for mild pain, moderate pain or headache.  . albuterol (VENTOLIN HFA) 108 (90 Base) MCG/ACT inhaler Inhale 1-2 puffs into the lungs every 6 (six) hours as needed for wheezing or shortness of breath.  [name] omeprazole (PRILOSEC) 40 MG capsule Take 40 mg by mouth daily.  . silver sulfADIAZINE (SILVADENE) 1 % cream Apply 1 application topically daily.    ROS:  Review of Systems  Constitutional: Negative.   HENT: Negative.   Eyes: Negative.   Respiratory: Negative.   Cardiovascular: Negative.   Gastrointestinal: Negative.   Genitourinary:  Negative.   Musculoskeletal: Positive for back pain and joint pain.  Skin: Negative.   Neurological: Negative.   Endo/Heme/Allergies: Negative.   Psychiatric/Behavioral: Negative.   All other systems reviewed and are negative.  See hpi  Objective:   Today's Vitals: BP 133/83 (BP Location: Right Arm, Patient Position: Sitting, Cuff Size: Normal)   Pulse 91   Temp (!) 97.4 F (36.3 C) (Temporal)   Ht 5' (1.524 m)   Wt 217 lb 12.8 oz (98.8 kg)   SpO2 97%   BMI 42.54 kg/m  Vitals with BMI 06/01/2020 02/28/2020 01/06/2020  Height 5\' 0"  - -  Weight 217 lbs 13 oz - -  BMI 42.54 - -  Systolic 133 129  Diastolic 83 80 99  Pulse 91 77 95     Physical Exam Vitals and nursing note reviewed.  Constitutional:      Appearance: Normal appearance. She is well-developed and well-groomed. She is morbidly obese.  HENT:     Head: Normocephalic and atraumatic.     Right Ear: External ear normal.     Left Ear: External ear normal.     Mouth/Throat:     Comments: Mask in place Eyes:     General:        Right eye: No discharge.        Left eye: No discharge.     Conjunctiva/sclera: Conjunctivae normal.  Cardiovascular:     Rate and Rhythm: Normal rate and regular rhythm.     Pulses: Normal pulses.     Heart sounds: Normal heart sounds.  Pulmonary:     Effort: Pulmonary effort is normal.     Breath sounds: Normal breath sounds.  Musculoskeletal:     Cervical back: Normal range of motion and neck supple.  Skin:    General: Skin is warm.     Comments: Varicose veins noted on Left lateral leg  Neurological:     General: No focal deficit present.     Mental Status: She is alert and oriented to person, place, and time.  Psychiatric:        Attention and Perception: Attention and perception normal.        Mood and Affect: Mood and affect normal.        Speech: Speech normal.        Behavior: Behavior normal. Behavior is cooperative.        Thought Content: Thought content normal.         Cognition and Memory: Cognition and memory normal.        Judgment: Judgment normal.      Assessment   1. Class 3 severe obesity due to excess calories with body mass index (BMI) of 40.0 to  44.9 in adult, unspecified whether serious comorbidity present ([place])   2. Mild persistent asthma without complication   3. Essential hypertension   4. Cigarette nicotine dependence with other nicotine-induced disorder   5. Mixed hyperlipidemia   6. Back pain with right-sided sciatica   7. Varicose veins of left lower extremity with pain   8. Arthritis of left knee   9. Screening for diabetes mellitus   10. Visit for screening mammogram     Tests ordered Orders Placed This Encounter  Procedures  . MM 3D SCREEN BREAST BILATERAL  . CBC  . COMPLETE METABOLIC PANEL WITH GFR  . Lipid panel  . Ambulatory referral to Orthopedic Surgery  . Ambulatory referral to Vascular Surgery  . POCT glycosylated hemoglobin (Hb A1C)     Plan: Please see assessment and plan per problem list above.   Meds ordered this encounter  Medications  . phentermine (ADIPEX-P) 37.5 MG tablet    Sig: Take 0.5 tablets (18.75 mg total) by mouth daily before breakfast.    Dispense:  15 tablet    Refill:  0    Order Specific Question:   Supervising Provider    Answer:   Fayrene Helper [[id number]]    Patient to follow-up in 4 weeks  Perlie Mayo, NP

## 2020-06-01 NOTE — Assessment & Plan Note (Signed)
She reports that she was 280 pounds and has lost down and would like to continue to lose down.  But she is hit a plateau.  She reports trying phentermine in the past and doing well.  Would like to try this again.  She is educated on not using the medication to use most of the way and to focus on diet and lifestyle changes.   Wt Readings from Last 3 Encounters:  06/01/20 217 lb 12.8 oz (98.8 kg)  09/21/19 214 lb 6.4 oz (97.3 kg)  09/10/19 212 lb 6.4 oz (96.3 kg)

## 2020-06-01 NOTE — Assessment & Plan Note (Signed)
Reports use of albuterol inhaler 1-2 times a week.  Has had more shortness of breath and wheezing since having Covid in December.  But overall is doing okay and stable.  Is not currently taking anything on a regular basis outside of the as needed inhaler.

## 2020-06-01 NOTE — Assessment & Plan Note (Signed)
Vascular Referral Has had vein stripping-reports it did not go well

## 2020-06-01 NOTE — Assessment & Plan Note (Signed)
Referral for orthopedic

## 2020-06-01 NOTE — Assessment & Plan Note (Signed)
Asked about quitting: confirms they are currently smokes cigarettes Advise to quit smoking: Educated about QUITTING to reduce the risk of cancer, cardio and cerebrovascular disease. Assess willingness: Unwilling to quit at this time, but is working on cutting back. Assist with counseling and pharmacotherapy: Counseled for 5 minutes and literature provided. Arrange for follow up:  not quitting follow up in 3 months and continue to offer help.   

## 2020-06-01 NOTE — Assessment & Plan Note (Signed)
Destiny Petty is encouraged to maintain a well balanced diet that is low in salt. Controlled on middle higher end. Additionally, she is also reminded that exercise is beneficial for heart health and control of  Blood pressure. 30-60 minutes daily is recommended-walking was suggested.

## 2020-06-01 NOTE — Assessment & Plan Note (Signed)
Ordering updated labs, heart healthy diet encouraged.

## 2020-06-01 NOTE — Patient Instructions (Addendum)
I appreciate the opportunity to provide you with care for your health and wellness. Today we discussed: establish care  Follow up: 4 weeks for weight check-in  Fasting Labs today A1c in Office Mammogram Referrals today: Ortho, Vascular  Please continue to practice social distancing to keep you, your family, and our community safe.  If you must go out, please wear a mask and practice good handwashing.  It was a pleasure to see you and I look forward to continuing to work together on your health and well-being. Please do not hesitate to call the office if you need care or have questions about your care.  Have a wonderful day and week. With Gratitude, Tereasa Coop, DNP, AGNP-BC

## 2020-06-01 NOTE — Assessment & Plan Note (Signed)
Reports being told that she has arthritis in her left knee.  Referral back to Ortho

## 2020-06-01 NOTE — Assessment & Plan Note (Signed)
Updated mammogram ordered Denies having any lumps bumps or changes in breast tissue.

## 2020-06-01 NOTE — Assessment & Plan Note (Signed)
Family history of diabetes.  Will check A1c here in the office.

## 2020-06-02 ENCOUNTER — Inpatient Hospital Stay: Admission: RE | Admit: 2020-06-02 | Payer: Medicaid Other | Source: Ambulatory Visit

## 2020-06-02 ENCOUNTER — Ambulatory Visit
Admission: RE | Admit: 2020-06-02 | Discharge: 2020-06-02 | Disposition: A | Discharge status: Home / Self Care | Payer: Medicaid Other | Source: Ambulatory Visit | Attending: Family Medicine | Admitting: Family Medicine

## 2020-06-02 DIAGNOSIS — Z1231 Encounter for screening mammogram for malignant neoplasm of breast: Secondary | ICD-10-CM | POA: Diagnosis not present

## 2020-06-13 ENCOUNTER — Ambulatory Visit (INDEPENDENT_AMBULATORY_CARE_PROVIDER_SITE_OTHER): Payer: Medicaid Other | Admitting: Orthopaedic Surgery

## 2020-06-13 ENCOUNTER — Other Ambulatory Visit: Payer: Self-pay

## 2020-06-13 ENCOUNTER — Telehealth: Payer: Self-pay | Admitting: Family Medicine

## 2020-06-13 ENCOUNTER — Encounter: Payer: Self-pay | Admitting: Orthopaedic Surgery

## 2020-06-13 ENCOUNTER — Ambulatory Visit: Payer: Medicaid Other

## 2020-06-13 VITALS — BP 135/75 | HR 87 | Ht 60.0 in | Wt 215.0 lb

## 2020-06-13 DIAGNOSIS — G8929 Other chronic pain: Secondary | ICD-10-CM

## 2020-06-13 DIAGNOSIS — Z6841 Body Mass Index (BMI) 40.0 and over, adult: Secondary | ICD-10-CM | POA: Diagnosis not present

## 2020-06-13 DIAGNOSIS — M79604 Pain in right leg: Secondary | ICD-10-CM

## 2020-06-13 DIAGNOSIS — M545 Low back pain: Secondary | ICD-10-CM | POA: Diagnosis not present

## 2020-06-13 DIAGNOSIS — M25562 Pain in left knee: Secondary | ICD-10-CM

## 2020-06-13 DIAGNOSIS — F17210 Nicotine dependence, cigarettes, uncomplicated: Secondary | ICD-10-CM

## 2020-06-13 MED ORDER — FLUTICASONE PROPIONATE 50 MCG/ACT NA SUSP: 1.0000 | Freq: Every day | NASAL | 0 refills | Status: DC

## 2020-06-13 MED ORDER — NAPROXEN 500 MG PO TABS: 500.0000 mg | ORAL_TABLET | Freq: Two times a day (BID) | ORAL | 5 refills | Status: DC

## 2020-06-13 NOTE — Telephone Encounter (Signed)
Patient would like a refill on flonase sent to [facility]. [phone]

## 2020-06-13 NOTE — Telephone Encounter (Signed)
Refill sent in

## 2020-06-13 NOTE — Progress Notes (Signed)
Subjective:    Patient ID: Destiny Petty, female    DOB: February 11, 1981, 39 y.o.   MRN: [id number]  HPI She has three year history of left knee pain and swelling and popping after fall at home.  She has pain at night.  She has giving way at times.  She has tried Tylenol and ice, heat with no good results.  It is gradually getting worse.  She also complains of lower back pain with right sided sciatica pain.  She has no numbness or weakness.  Her knee is the main reason for her visit here today.   Review of Systems  Constitutional: Positive for activity change.  Musculoskeletal: Positive for arthralgias, back pain, gait problem, joint swelling and myalgias.  Allergic/Immunologic: Positive for environmental allergies.  Psychiatric/Behavioral: Positive for self-injury.  All other systems reviewed and are negative.  For Review of Systems, all other systems reviewed and are negative.  The following is a summary of the past history medically, past history surgically, known current medicines, social history and family history.  This information is gathered electronically by the computer from prior information and documentation.  I review this each visit and have found including this information at this point in the chart is beneficial and informative.   Past Medical History:  Diagnosis Date  . Allergy    SEASONAL  . 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 Anxiety   . Arthritis 12/04/2016   Phreesia 05/23/2020  . Asthma   . Back pain with right-sided sciatica 06/18/2016  . Bipolar disorder (HCC) 01/15/2016   Overview:  Tried Depakote  . Body mass index 36.0-36.9, adult 04/10/2016  . Body mass index 37.0-37.9, adult 03/13/2016  . Depression   . Depressive disorder, not elsewhere classified 03/27/2007  . Dysmenorrhea 04/10/2016  . Family history of adverse reaction to anesthesia    PONV  . GERD (gastroesophageal reflux disease)   . Hemorrhoids 02/10/2015  . Hiatal hernia   . Hyperlipidemia 04/09/2011   . Hypertension   . Menorrhagia with regular cycle 04/10/2016  . Migraine 01/15/2016   Overview:  Overview:  IMO Load 2016 R1.3  . Muscle spasm of back 03/13/2016  . Numbness and tingling in left hand 08/19/2016  . Obesity   . Ovarian cyst 12/18/2016  . Perianal wart 02/10/2015  . Stress 02/14/2016  . Weight loss counseling, encounter for 03/13/2016    Past Surgical History:  Procedure Laterality Date  . ABLATION    . CESAREAN SECTION    . CESAREAN SECTION N/A    Phreesia 05/23/2020  . CHOLECYSTECTOMY    . COLONOSCOPY    . DILATATION AND CURETTAGE/HYSTEROSCOPY WITH MINERVA N/A 08/31/2019   Procedure: DILATATION AND CURETTAGE (no specimen) /HYSTEROSCOPY WITH MINERVA ENDOMETRIAL ABLATION;  Surgeon: Tilda Burrow, MD;  Location: [facility];  Service: Gynecology;  Laterality: N/A;  . ESOPHAGOGASTRODUODENOSCOPY ENDOSCOPY    . TUBAL LIGATION      Current Outpatient Medications on File Prior to Visit  Medication Sig Dispense Refill  . acetaminophen (TYLENOL) 500 MG tablet Take 1,000 mg by mouth every 6 (six) hours as needed for mild pain, moderate pain or headache.    . albuterol (VENTOLIN HFA) 108 (90 Base) MCG/ACT inhaler Inhale 1-2 puffs into the lungs every 6 (six) hours as needed for wheezing or shortness of breath. 18 g 1  . omeprazole (PRILOSEC) 40 MG capsule Take 40 mg by mouth daily.    . phentermine (ADIPEX-P) 37.5 MG tablet Take 0.5 tablets (18.75 mg total) by  mouth daily before breakfast. 15 tablet 0  . fluticasone (FLONASE) 50 MCG/ACT nasal spray Place 1 spray into both nostrils daily for 14 days. 16 g 0   No current facility-administered medications on file prior to visit.    Social History   Socioeconomic History  . Marital status: Legally Separated    Spouse name: Not on file  . Number of children: 2  . Years of education: 66  . Highest education level: Not on file  Occupational History  . Not on file  Tobacco Use  . Smoking status: Current Every Day Smoker    Packs/day:  1.00    Years: 20.00    Pack years: 20.00    Types: Cigarettes  . Smokeless tobacco: Never Used  . Tobacco comment: trying to quit  Vaping Use  . Vaping Use: Never used  Substance and Sexual Activity  . Alcohol use: Yes    Comment: occ  . Drug use: No  . Sexual activity: Yes    Birth control/protection: Surgical    Comment: tubal  Other Topics Concern  . 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 Difficulty of Paying Living Expenses: Not hard at all  Food Insecurity: No Food Insecurity  . Worried About Programme researcher, broadcasting/film/video in the Last Year: Never true  . Ran Out of Food in the Last Year: Never true  Transportation Needs: No Transportation Needs  . Lack of Transportation (Medical): No  . Lack of Transportation (Non-Medical): No  Physical Activity: Insufficiently Active  . Days of Exercise per Week: 2 days  . Minutes of Exercise per Session: 30 min  Stress: No Stress Concern Present  . Feeling of Stress : Not at all  Social Connections: Moderately Isolated  . Frequency of Communication with Friends and Family: More than three times a week  . Frequency of Social Gatherings with Friends and Family: More than three times a week  . Attends Religious Services: More than 4 times per year  . Active Member of Clubs or Organizations: No  . Attends Banker Meetings: Never  . Marital Status: Separated  Intimate Partner Violence: Not At Risk  . Fear of Current or Ex-Partner: No  . Emotionally Abused: No  . Physically  Abused: No  . Sexually Abused: No    Family History  Problem Relation Age of Onset  . Diabetes Mother   . Kidney failure Mother   . Early death Mother 93       diabetes complications  . Cancer Father 69       colon cancer  . Depression Sister   . Hypertension Sister   . Anxiety disorder Sister   . Other Sister        back problems  . ADD / ADHD Daughter   .  Other Daughter        behavioral issues  . ODD Daughter   . Asthma Son   . Cancer Maternal Grandmother        breast  . Seizures Maternal Grandmother   . Lupus Other   . Breast cancer Maternal Aunt     BP 135/75   Pulse 87   Ht 5' (1.524 m)   Wt 215 lb (97.5 kg)   BMI 41.99 kg/m   Body mass index is 41.99 kg/m.     Objective:   Physical Exam Vitals and nursing note reviewed.  Constitutional:      Appearance: She is well-developed.  HENT:     Head: Normocephalic and atraumatic.  Eyes:     Conjunctiva/sclera: Conjunctivae normal.     Pupils: Pupils are equal, round, and reactive to light.  Cardiovascular:     Rate and Rhythm: Normal rate and regular rhythm.  Pulmonary:     Effort: Pulmonary effort is normal.  Abdominal:     Palpations: Abdomen is soft.  Musculoskeletal:       Arms:     Cervical back: Normal range of motion and neck supple.       Legs:  Skin:    General: Skin is warm and dry.  Neurological:     Mental Status: She is alert and oriented to person, place, and time.     Cranial Nerves: No cranial nerve deficit.     Motor: No abnormal muscle tone.     Coordination: Coordination normal.     Deep Tendon Reflexes: Reflexes are normal and symmetric. Reflexes normal.  Psychiatric:        Behavior: Behavior normal.        Thought Content: Thought content normal.        Judgment: Judgment normal.      X-rays were done of the left knee, reported separately.     Assessment & Plan:   Encounter Diagnoses  Name Primary?  . Chronic pain of left knee Yes  . Body mass index 40.0-44.9,  adult (HCC)   . Morbid obesity (HCC)   . Lumbar pain with radiation down right leg   . Nicotine dependence, cigarettes, uncomplicated    PROCEDURE NOTE:  The patient requests injections of the left knee , verbal consent was obtained.  The left knee was prepped appropriately after time out was performed.   Sterile technique was observed and injection of 1 cc of Depo-Medrol 40 mg with several cc's of plain xylocaine. Anesthesia was provided by ethyl chloride and a 20-gauge needle was used to inject the knee area. The injection was tolerated well.  A band aid dressing was applied.  The patient was advised to apply ice later today and tomorrow to the injection sight as needed.  Begin Naprosyn 500 po bid pc.  Return in one month.  Call if any problem.  Precautions discussed.   Electronically Signed Darreld Mclean, MD 7/6/20218:38 AM

## 2020-06-13 NOTE — Patient Instructions (Signed)
Steps to Quit Smoking Smoking tobacco is the leading cause of preventable death. It can affect almost every organ in the body. Smoking puts you and people around you at risk for many serious, long-lasting (chronic) diseases. Quitting smoking can be hard, but it is one of the best things that you can do for your health. It is never too late to quit. How do I get ready to quit? When you decide to quit smoking, make a plan to help you succeed. Before you quit:  Pick a date to quit. Set a date within the next 2 weeks to give you time to prepare.  Write down the reasons why you are quitting. Keep this list in places where you will see it often.  Tell your family, friends, and co-workers that you are quitting. Their support is important.  Talk with your doctor about the choices that may help you quit.  Find out if your health insurance will pay for these treatments.  Know the people, places, things, and activities that make you want to smoke (triggers). Avoid them. What first steps can I take to quit smoking?  Throw away all cigarettes at home, at work, and in your car.  Throw away the things that you use when you smoke, such as ashtrays and lighters.  Clean your car. Make sure to empty the ashtray.  Clean your home, including curtains and carpets. What can I do to help me quit smoking? Talk with your doctor about taking medicines and seeing a counselor at the same time. You are more likely to succeed when you do both.  If you are pregnant or breastfeeding, talk with your doctor about counseling or other ways to quit smoking. Do not take medicine to help you quit smoking unless your doctor tells you to do so. To quit smoking: Quit right away  Quit smoking totally, instead of slowly cutting back on how much you smoke over a period of time.  Go to counseling. You are more likely to quit if you go to counseling sessions regularly. Take medicine You may take medicines to help you quit. Some  medicines need a prescription, and some you can buy over-the-counter. Some medicines may contain a drug called nicotine to replace the nicotine in cigarettes. Medicines may:  Help you to stop having the desire to smoke (cravings).  Help to stop the problems that come when you stop smoking (withdrawal symptoms). Your doctor may ask you to use:  Nicotine patches, gum, or lozenges.  Nicotine inhalers or sprays.  Non-nicotine medicine that is taken by mouth. Find resources Find resources and other ways to help you quit smoking and remain smoke-free after you quit. These resources are most helpful when you use them often. They include:  Online chats with a counselor.  Phone quitlines.  Printed self-help materials.  Support groups or group counseling.  Text messaging programs.  Mobile phone apps. Use apps on your mobile phone or tablet that can help you stick to your quit plan. There are many free apps for mobile phones and tablets as well as websites. Examples include Quit Guide from the CDC and smokefree.gov  What things can I do to make it easier to quit?   Talk to your family and friends. Ask them to support and encourage you.  Call a phone quitline (1-800-QUIT-NOW), reach out to support groups, or work with a counselor.  Ask people who smoke to not smoke around you.  Avoid places that make you want to smoke,   such as: ? Bars. ? Parties. ? Smoke-break areas at work.  Spend time with people who do not smoke.  Lower the stress in your life. Stress can make you want to smoke. Try these things to help your stress: ? Getting regular exercise. ? Doing deep-breathing exercises. ? Doing yoga. ? Meditating. ? Doing a body scan. To do this, close your eyes, focus on one area of your body at a time from head to toe. Notice which parts of your body are tense. Try to relax the muscles in those areas. How will I feel when I quit smoking? Day 1 to 3 weeks Within the first 24 hours,  you may start to have some problems that come from quitting tobacco. These problems are very bad 2-3 days after you quit, but they do not often last for more than 2-3 weeks. You may get these symptoms:  Mood swings.  Feeling restless, nervous, angry, or annoyed.  Trouble concentrating.  Dizziness.  Strong desire for high-sugar foods and nicotine.  Weight gain.  Trouble pooping (constipation).  Feeling like you may vomit (nausea).  Coughing or a sore throat.  Changes in how the medicines that you take for other issues work in your body.  Depression.  Trouble sleeping (insomnia). Week 3 and afterward After the first 2-3 weeks of quitting, you may start to notice more positive results, such as:  Better sense of smell and taste.  Less coughing and sore throat.  Slower heart rate.  Lower blood pressure.  Clearer skin.  Better breathing.  Fewer sick days. Quitting smoking can be hard. Do not give up if you fail the first time. Some people need to try a few times before they succeed. Do your best to stick to your quit plan, and talk with your doctor if you have any questions or concerns. Summary  Smoking tobacco is the leading cause of preventable death. Quitting smoking can be hard, but it is one of the best things that you can do for your health.  When you decide to quit smoking, make a plan to help you succeed.  Quit smoking right away, not slowly over a period of time.  When you start quitting, seek help from your doctor, family, or friends. This information is not intended to replace advice given to you by your health care provider. Make sure you discuss any questions you have with your health care provider. Document Revised: 08/20/2019 Document Reviewed: 02/13/2019 Elsevier Patient Education  2020 [employer] Inc.  

## 2020-06-19 ENCOUNTER — Other Ambulatory Visit: Payer: Self-pay | Admitting: *Deleted

## 2020-06-19 ENCOUNTER — Telehealth: Payer: Self-pay | Admitting: Family Medicine

## 2020-06-19 MED ORDER — ALBUTEROL SULFATE HFA 108 (90 BASE) MCG/ACT IN AERS: 1.0000 | INHALATION_SPRAY | Freq: Four times a day (QID) | RESPIRATORY_TRACT | 1 refills | Status: DC | PRN

## 2020-06-19 NOTE — Telephone Encounter (Signed)
Pt needing a refill on her inhaler (albuterol) says hers got messed up on vacation.

## 2020-06-19 NOTE — Telephone Encounter (Signed)
Medication sent to pt pharmacy 

## 2020-06-21 ENCOUNTER — Encounter: Payer: Medicaid Other | Admitting: Vascular Surgery

## 2020-06-30 ENCOUNTER — Ambulatory Visit (INDEPENDENT_AMBULATORY_CARE_PROVIDER_SITE_OTHER): Payer: Medicaid Other | Admitting: Family Medicine

## 2020-06-30 ENCOUNTER — Other Ambulatory Visit: Payer: Self-pay

## 2020-06-30 ENCOUNTER — Encounter: Payer: Self-pay | Admitting: Family Medicine

## 2020-06-30 VITALS — BP 156/82 | HR 76 | Temp 97.6°F | Resp 16 | Ht 63.0 in | Wt 214.4 lb

## 2020-06-30 DIAGNOSIS — Z6841 Body Mass Index (BMI) 40.0 and over, adult: Secondary | ICD-10-CM

## 2020-06-30 DIAGNOSIS — F17218 Nicotine dependence, cigarettes, with other nicotine-induced disorders: Secondary | ICD-10-CM

## 2020-06-30 MED ORDER — BUPROPION HCL ER (XL) 150 MG PO TB24: 150.0000 mg | ORAL_TABLET | Freq: Every day | ORAL | 0 refills | Status: DC

## 2020-06-30 NOTE — Assessment & Plan Note (Signed)
Limited success with phentermine.  Has stopped drinking sodas.  Reports that she has had a 3 pound weight loss secondary to that and walking more frequently.  However she been under a lot of stress going eating the best.  Is open to trying a different medication as she does not think phentermine is the option for her.  We will start Wellbutrin as she also smokes and has food cravings.  She is aware of side effects and is in agreement to care plan.

## 2020-06-30 NOTE — Assessment & Plan Note (Signed)
Will be starting Wellbutrin.

## 2020-06-30 NOTE — Patient Instructions (Signed)
I appreciate the opportunity to provide you with care for your health and wellness. Today we discussed: weight   Follow up: 4 weeks by phone for new med f/u  No labs or referrals today  Lets try Wellbutrin-take as directed.  Keep up the good work in diet and lifestyle changes. Walk as you can, get to point where you walk daily. Great job on not drinking sodas!  Please continue to practice social distancing to keep you, your family, and our community safe.  If you must go out, please wear a mask and practice good handwashing.  It was a pleasure to see you and I look forward to continuing to work together on your health and well-being. Please do not hesitate to call the office if you need care or have questions about your care.  Have a wonderful day and week. With Gratitude, Tereasa Coop, DNP, AGNP-BC

## 2020-06-30 NOTE — Progress Notes (Signed)
Subjective:  Patient ID: Destiny Petty, female    DOB: 1981/05/15  Age: 39 y.o. MRN: [id number]  CC:  Chief Complaint  Patient presents with  . Follow-up    4 week follow up would like to try something different for weight phentermine is not working she has been exercising and working out has not been to vein specalist as she has been transpotation issues but she will schedule the appointment when she gets everything under control       HPI  HPI     Today patient denies signs and symptoms of COVID 19 infection including fever, chills, cough, shortness of breath, and headache. Past Medical, Surgical, Social History, Allergies, and Medications have been Reviewed.   Past Medical History:  Diagnosis Date  . Allergy    SEASONAL  . 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 Anxiety   . Arthritis 12/04/2016   Phreesia 05/23/2020  . Asthma   . Back pain with right-sided sciatica 06/18/2016  . Bipolar disorder (HCC) 01/15/2016   Overview:  Tried Depakote  . Body mass index 36.0-36.9, adult 04/10/2016  . Body mass index 37.0-37.9, adult 03/13/2016  . Depression   . Depressive disorder, not elsewhere classified 03/27/2007  . Dysmenorrhea 04/10/2016  . Family history of adverse reaction to anesthesia    PONV  . GERD (gastroesophageal reflux disease)   . Hemorrhoids 02/10/2015  . Hiatal hernia   . Hyperlipidemia 04/09/2011  . Hypertension   . Menorrhagia with regular cycle 04/10/2016  . Migraine 01/15/2016   Overview:  Overview:  IMO Load 2016 R1.3  . Muscle spasm of back 03/13/2016  . Numbness and tingling in left hand 08/19/2016  . Obesity   . Ovarian cyst 12/18/2016  . Perianal wart 02/10/2015  . Stress 02/14/2016  . Weight loss counseling, encounter for 03/13/2016    Current Meds  Medication Sig  . acetaminophen (TYLENOL) 500 MG tablet Take 1,000 mg by mouth every 6 (six) hours as needed for mild pain, moderate pain or headache.  . albuterol (VENTOLIN HFA) 108 (90 Base) MCG/ACT inhaler  Inhale 1-2 puffs into the lungs every 6 (six) hours as needed for wheezing or shortness of breath.  . naproxen (NAPROSYN) 500 MG tablet Take 1 tablet (500 mg total) by mouth 2 (two) times daily with a meal.  . omeprazole (PRILOSEC) 40 MG capsule Take 40 mg by mouth daily.  . phentermine (ADIPEX-P) 37.5 MG tablet Take 0.5 tablets (18.75 mg total) by mouth daily before breakfast.    ROS:  Review of Systems  Constitutional: Negative.   HENT: Negative.   Eyes: Negative.   Respiratory: Negative.   Cardiovascular: Negative.   Gastrointestinal: Negative.   Genitourinary: Negative.   Musculoskeletal: Negative.   Skin: Negative.   Neurological: Negative.   Endo/Heme/Allergies: Negative.   Psychiatric/Behavioral: Negative.   All other systems reviewed and are negative.    Objective:   Today's Vitals: BP (!) 156/82 (BP Location: Right Arm, Patient Position: Sitting, Cuff Size: Normal)   Pulse 76   Temp 97.6 F (36.4 C) (Temporal)   Resp 16   Ht 5\' 3"  (1.6 m)   Wt (!) 214 lb 6.4 oz (97.3 kg)   SpO2 93%   BMI 37.98 kg/m  Vitals with BMI 06/30/2020 06/13/2020 06/01/2020  Height 5\' 3"  5\' 0"  5\' 0"   Weight 214 lbs 6 oz 215 lbs 217 lbs 13 oz  BMI 37.99 41.99 42.54  Systolic 156 135 06/03/2020  Diastolic 82 75 83  Pulse 76 87 91     Physical Exam Vitals and nursing note reviewed.  Constitutional:      Appearance: Normal appearance. She is well-developed and well-groomed. She is obese.  HENT:     Head: Normocephalic and atraumatic.     Right Ear: External ear normal.     Left Ear: External ear normal.     Mouth/Throat:     Comments: Mask in place  Eyes:     General:        Right eye: No discharge.        Left eye: No discharge.     Conjunctiva/sclera: Conjunctivae normal.  Cardiovascular:     Rate and Rhythm: Normal rate and regular rhythm.     Pulses: Normal pulses.     Heart sounds: Normal heart sounds.  Pulmonary:     Effort: Pulmonary effort is normal.     Breath sounds:  Wheezing present.  Musculoskeletal:        General: Normal range of motion.     Cervical back: Normal range of motion and neck supple.  Skin:    General: Skin is warm.  Neurological:     General: No focal deficit present.     Mental Status: She is alert and oriented to person, place, and time.  Psychiatric:        Attention and Perception: Attention normal.        Mood and Affect: Mood normal.        Speech: Speech normal.        Behavior: Behavior normal. Behavior is cooperative.        Thought Content: Thought content normal.        Cognition and Memory: Cognition normal.        Judgment: Judgment normal.     Assessment   1. Class 3 severe obesity due to excess calories with body mass index (BMI) of 40.0 to 44.9 in adult, unspecified whether serious comorbidity present (HCC)   2. Cigarette nicotine dependence with other nicotine-induced disorder     Tests ordered No orders of the defined types were placed in this encounter.    Plan: Please see assessment and plan per problem list above.   Meds ordered this encounter  Medications  . buPROPion (WELLBUTRIN XL) 150 MG 24 hr tablet    Sig: Take 1 tablet (150 mg total) by mouth daily.    Dispense:  30 tablet    Refill:  0    Order Specific Question:   Supervising Provider    Answer:   Kerri Perches [[id number]]    Patient to follow-up in 4 weeks.  Freddy Finner, NP

## 2020-07-11 ENCOUNTER — Other Ambulatory Visit: Payer: Self-pay

## 2020-07-11 ENCOUNTER — Ambulatory Visit (INDEPENDENT_AMBULATORY_CARE_PROVIDER_SITE_OTHER): Payer: Medicaid Other | Admitting: Orthopaedic Surgery

## 2020-07-11 ENCOUNTER — Encounter: Payer: Self-pay | Admitting: Orthopaedic Surgery

## 2020-07-11 ENCOUNTER — Telehealth: Payer: Self-pay | Admitting: Orthopaedic Surgery

## 2020-07-11 VITALS — BP 103/65 | HR 74 | Ht 60.0 in | Wt 214.0 lb

## 2020-07-11 DIAGNOSIS — G8929 Other chronic pain: Secondary | ICD-10-CM

## 2020-07-11 DIAGNOSIS — Z6841 Body Mass Index (BMI) 40.0 and over, adult: Secondary | ICD-10-CM | POA: Diagnosis not present

## 2020-07-11 DIAGNOSIS — M25562 Pain in left knee: Secondary | ICD-10-CM | POA: Diagnosis not present

## 2020-07-11 MED ORDER — DICLOFENAC SODIUM 75 MG PO TBEC: 75.0000 mg | DELAYED_RELEASE_TABLET | Freq: Two times a day (BID) | ORAL | 2 refills | Status: DC

## 2020-07-11 NOTE — Progress Notes (Signed)
Patient Destiny Petty, female DOB:Aug 12, 1981, 39 y.o. KPT:[id number]  Chief Complaint  Patient presents with  . Knee Pain    left     HPI  Destiny Petty is a 39 y.o. female who has continued pain and swelling of the left knee.  The injection only helped a week.  She did not tolerate the Naprosyn.  I will change to diclofenac.  I will see her in two weeks and then schedule a MRI the following week.  She has to wait six weeks according to her insurance before MRI.   Body mass index is 41.79 kg/m.  The patient meets the AMA guidelines for Morbid (severe) obesity with a BMI > 40.0 and I have recommended weight loss.   ROS  Review of Systems  Constitutional: Positive for activity change.  Musculoskeletal: Positive for arthralgias, back pain, gait problem, joint swelling and myalgias.  Allergic/Immunologic: Positive for environmental allergies.  Psychiatric/Behavioral: Positive for self-injury.  All other systems reviewed and are negative.   All other systems reviewed and are negative.  The following is a summary of the past history medically, past history surgically, known current medicines, social history and family history.  This information is gathered electronically by the computer from prior information and documentation.  I review this each visit and have found including this information at this point in the chart is beneficial and informative.    Past Medical History:  Diagnosis Date  . Allergy    SEASONAL  . 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 Anxiety   . Arthritis 12/04/2016   Phreesia 05/23/2020  . Asthma   . Back pain with right-sided sciatica 06/18/2016  . Bipolar disorder (HCC) 01/15/2016   Overview:  Tried Depakote  . Body mass index 36.0-36.9, adult 04/10/2016  . Body mass index 37.0-37.9, adult 03/13/2016  . Depression   . Depressive disorder, not elsewhere classified 03/27/2007  . Dysmenorrhea 04/10/2016  . Family history of adverse reaction to  anesthesia    PONV  . GERD (gastroesophageal reflux disease)   . Hemorrhoids 02/10/2015  . Hiatal hernia   . Hyperlipidemia 04/09/2011  . Hypertension   . Menorrhagia with regular cycle 04/10/2016  . Migraine 01/15/2016   Overview:  Overview:  IMO Load 2016 R1.3  . Muscle spasm of back 03/13/2016  . Numbness and tingling in left hand 08/19/2016  . Obesity   . Ovarian cyst 12/18/2016  . Perianal wart 02/10/2015  . Stress 02/14/2016  . Weight loss counseling, encounter for 03/13/2016    Past Surgical History:  Procedure Laterality Date  . ABLATION    . CESAREAN SECTION    . CESAREAN SECTION N/A    Phreesia 05/23/2020  . CHOLECYSTECTOMY    . COLONOSCOPY    . DILATATION AND CURETTAGE/HYSTEROSCOPY WITH MINERVA N/A 08/31/2019   Procedure: DILATATION AND CURETTAGE (no specimen) /HYSTEROSCOPY WITH MINERVA ENDOMETRIAL ABLATION;  Surgeon: Tilda Burrow, MD;  Location: [facility];  Service: Gynecology;  Laterality: N/A;  . ESOPHAGOGASTRODUODENOSCOPY ENDOSCOPY    . TUBAL LIGATION      Family History  Problem Relation Age of Onset  . Diabetes Mother   . Kidney failure Mother   . Early death Mother 29       diabetes complications  . Cancer Father 58       colon cancer  . Depression Sister   . Hypertension Sister   . Anxiety disorder Sister   . Other Sister        back problems  .  ADD / ADHD Daughter   . Other Daughter        behavioral issues  . ODD Daughter   . Asthma Son   . Cancer Maternal Grandmother        breast  . Seizures Maternal Grandmother   . Lupus Other   . Breast cancer Maternal Aunt     Social History Social History   Tobacco Use  . Smoking status: Current Every Day Smoker    Packs/day: 1.00    Years: 20.00    Pack years: 20.00    Types: Cigarettes  . Smokeless tobacco: Never Used  . Tobacco comment: trying to quit  Vaping Use  . Vaping Use: Never used  Substance Use Topics  . Alcohol use: Yes    Comment: occ  . Drug use: No    Allergies  Allergen Reactions   . Bee Venom Shortness Of Breath  . Latex Itching  . Lisinopril Cough    coughing    Current Outpatient Medications  Medication Sig Dispense Refill  . acetaminophen (TYLENOL) 500 MG tablet Take 1,000 mg by mouth every 6 (six) hours as needed for mild pain, moderate pain or headache.    . albuterol (VENTOLIN HFA) 108 (90 Base) MCG/ACT inhaler Inhale 1-2 puffs into the lungs every 6 (six) hours as needed for wheezing or shortness of breath. 18 g 1  . buPROPion (WELLBUTRIN XL) 150 MG 24 hr tablet Take 1 tablet (150 mg total) by mouth daily. 30 tablet 0  . omeprazole (PRILOSEC) 40 MG capsule Take 40 mg by mouth daily.    . diclofenac (VOLTAREN) 75 MG EC tablet Take 1 tablet (75 mg total) by mouth 2 (two) times daily with a meal. 60 tablet 2   No current facility-administered medications for this visit.     Physical Exam  Blood pressure 103/65, pulse 74, height 5' (1.524 m), weight 214 lb (97.1 kg).  Constitutional: overall normal hygiene, normal nutrition, well developed, normal grooming, normal body habitus. Assistive device:none  Musculoskeletal: gait and station Limp left, muscle tone and strength are normal, no tremors or atrophy is present.  .  Neurological: coordination overall normal.  Deep tendon reflex/nerve stretch intact.  Sensation normal.  Cranial nerves II-XII intact.   Skin:   Normal overall no scars, lesions, ulcers or rashes. No psoriasis.  Psychiatric: Alert and oriented x 3.  Recent memory intact, remote memory unclear.  Normal mood and affect. Well groomed.  Good eye contact.  Cardiovascular: overall no swelling, no varicosities, no edema bilaterally, normal temperatures of the legs and arms, no clubbing, cyanosis and good capillary refill.  Lymphatic: palpation is normal.  Left knee with tenderness, crepitus, effusion, ROM 0 to 105, limp left, positive medial [place].  NV intact.  All other systems reviewed and are negative   The patient has been educated  about the nature of the problem(s) and counseled on treatment options.  The patient appeared to understand what I have discussed and is in agreement with it.  Encounter Diagnoses  Name Primary?  . Chronic pain of left knee Yes  . Body mass index 40.0-44.9, adult (HCC)   . Morbid obesity (HCC)     PLAN Call if any problems.  Precautions discussed.  Changed to diclofenac.  Return to clinic 2 weeks   Electronically Signed Darreld Mclean, MD 8/3/20219:41 AM

## 2020-07-11 NOTE — Patient Instructions (Signed)
Steps to Quit Smoking Smoking tobacco is the leading cause of preventable death. It can affect almost every organ in the body. Smoking puts you and people around you at risk for many serious, long-lasting (chronic) diseases. Quitting smoking can be hard, but it is one of the best things that you can do for your health. It is never too late to quit. How do I get ready to quit? When you decide to quit smoking, make a plan to help you succeed. Before you quit:  Pick a date to quit. Set a date within the next 2 weeks to give you time to prepare.  Write down the reasons why you are quitting. Keep this list in places where you will see it often.  Tell your family, friends, and co-workers that you are quitting. Their support is important.  Talk with your doctor about the choices that may help you quit.  Find out if your health insurance will pay for these treatments.  Know the people, places, things, and activities that make you want to smoke (triggers). Avoid them. What first steps can I take to quit smoking?  Throw away all cigarettes at home, at work, and in your car.  Throw away the things that you use when you smoke, such as ashtrays and lighters.  Clean your car. Make sure to empty the ashtray.  Clean your home, including curtains and carpets. What can I do to help me quit smoking? Talk with your doctor about taking medicines and seeing a counselor at the same time. You are more likely to succeed when you do both.  If you are pregnant or breastfeeding, talk with your doctor about counseling or other ways to quit smoking. Do not take medicine to help you quit smoking unless your doctor tells you to do so. To quit smoking: Quit right away  Quit smoking totally, instead of slowly cutting back on how much you smoke over a period of time.  Go to counseling. You are more likely to quit if you go to counseling sessions regularly. Take medicine You may take medicines to help you quit. Some  medicines need a prescription, and some you can buy over-the-counter. Some medicines may contain a drug called nicotine to replace the nicotine in cigarettes. Medicines may:  Help you to stop having the desire to smoke (cravings).  Help to stop the problems that come when you stop smoking (withdrawal symptoms). Your doctor may ask you to use:  Nicotine patches, gum, or lozenges.  Nicotine inhalers or sprays.  Non-nicotine medicine that is taken by mouth. Find resources Find resources and other ways to help you quit smoking and remain smoke-free after you quit. These resources are most helpful when you use them often. They include:  Online chats with a counselor.  Phone quitlines.  Printed self-help materials.  Support groups or group counseling.  Text messaging programs.  Mobile phone apps. Use apps on your mobile phone or tablet that can help you stick to your quit plan. There are many free apps for mobile phones and tablets as well as websites. Examples include Quit Guide from the CDC and smokefree.gov  What things can I do to make it easier to quit?   Talk to your family and friends. Ask them to support and encourage you.  Call a phone quitline (1-800-QUIT-NOW), reach out to support groups, or work with a counselor.  Ask people who smoke to not smoke around you.  Avoid places that make you want to smoke,   such as: ? Bars. ? Parties. ? Smoke-break areas at work.  Spend time with people who do not smoke.  Lower the stress in your life. Stress can make you want to smoke. Try these things to help your stress: ? Getting regular exercise. ? Doing deep-breathing exercises. ? Doing yoga. ? Meditating. ? Doing a body scan. To do this, close your eyes, focus on one area of your body at a time from head to toe. Notice which parts of your body are tense. Try to relax the muscles in those areas. How will I feel when I quit smoking? Day 1 to 3 weeks Within the first 24 hours,  you may start to have some problems that come from quitting tobacco. These problems are very bad 2-3 days after you quit, but they do not often last for more than 2-3 weeks. You may get these symptoms:  Mood swings.  Feeling restless, nervous, angry, or annoyed.  Trouble concentrating.  Dizziness.  Strong desire for high-sugar foods and nicotine.  Weight gain.  Trouble pooping (constipation).  Feeling like you may vomit (nausea).  Coughing or a sore throat.  Changes in how the medicines that you take for other issues work in your body.  Depression.  Trouble sleeping (insomnia). Week 3 and afterward After the first 2-3 weeks of quitting, you may start to notice more positive results, such as:  Better sense of smell and taste.  Less coughing and sore throat.  Slower heart rate.  Lower blood pressure.  Clearer skin.  Better breathing.  Fewer sick days. Quitting smoking can be hard. Do not give up if you fail the first time. Some people need to try a few times before they succeed. Do your best to stick to your quit plan, and talk with your doctor if you have any questions or concerns. Summary  Smoking tobacco is the leading cause of preventable death. Quitting smoking can be hard, but it is one of the best things that you can do for your health.  When you decide to quit smoking, make a plan to help you succeed.  Quit smoking right away, not slowly over a period of time.  When you start quitting, seek help from your doctor, family, or friends. This information is not intended to replace advice given to you by your health care provider. Make sure you discuss any questions you have with your health care provider. Document Revised: 08/20/2019 Document Reviewed: 02/13/2019 Elsevier Patient Education  2020 [employer] Inc.  

## 2020-07-11 NOTE — Telephone Encounter (Signed)
Sheppard Coil from [place] called stating that the Diclofenac prescribed for this patient needs prior authorization.   She says it is on the non preferred list for [place] Medicaid.    Destiny Petty wants to know if you will prescribe something else in place of this medication such as Celebrex, Ibuprofen, Meloxicam or Naprosyn.   Please send the new prescription to [facility]

## 2020-07-12 MED ORDER — CELECOXIB 200 MG PO CAPS: ORAL_CAPSULE | ORAL | 3 refills | Status: DC

## 2020-07-27 ENCOUNTER — Ambulatory Visit (INDEPENDENT_AMBULATORY_CARE_PROVIDER_SITE_OTHER): Payer: Medicaid Other | Admitting: Orthopaedic Surgery

## 2020-07-27 ENCOUNTER — Encounter: Payer: Self-pay | Admitting: Orthopaedic Surgery

## 2020-07-27 ENCOUNTER — Other Ambulatory Visit: Payer: Self-pay

## 2020-07-27 VITALS — Ht 60.0 in | Wt 209.5 lb

## 2020-07-27 DIAGNOSIS — Z6841 Body Mass Index (BMI) 40.0 and over, adult: Secondary | ICD-10-CM

## 2020-07-27 DIAGNOSIS — M25562 Pain in left knee: Secondary | ICD-10-CM | POA: Diagnosis not present

## 2020-07-27 DIAGNOSIS — F17210 Nicotine dependence, cigarettes, uncomplicated: Secondary | ICD-10-CM | POA: Diagnosis not present

## 2020-07-27 DIAGNOSIS — G8929 Other chronic pain: Secondary | ICD-10-CM

## 2020-07-27 MED ORDER — MELOXICAM 15 MG PO TABS: 15.0000 mg | ORAL_TABLET | Freq: Every day | ORAL | 2 refills | Status: DC

## 2020-07-27 NOTE — Progress Notes (Signed)
Patient Destiny Petty, female DOB:January 02, 1981, 39 y.o. DUK:[id number]  Chief Complaint  Patient presents with  . Knee Pain    L/hurting/meds not helping/10 on pain scale    HPI  Destiny Petty is a 39 y.o. female who has continued pain of the left knee with giving way.  The injection did not help that much, she has more pain.  The Naprosyn did not help.  I will stop the Naprosyn, begin Mobic and set up MRI.   Body mass index is 40.92 kg/m.  The patient meets the AMA guidelines for Morbid (severe) obesity with a BMI > 40.0 and I have recommended weight loss.  ROS  Review of Systems  Constitutional: Positive for activity change.  Musculoskeletal: Positive for arthralgias, back pain, gait problem, joint swelling and myalgias.  Allergic/Immunologic: Positive for environmental allergies.  Psychiatric/Behavioral: Positive for self-injury.  All other systems reviewed and are negative.   All other systems reviewed and are negative.  The following is a summary of the past history medically, past history surgically, known current medicines, social history and family history.  This information is gathered electronically by the computer from prior information and documentation.  I review this each visit and have found including this information at this point in the chart is beneficial and informative.    Past Medical History:  Diagnosis Date  . Allergy    SEASONAL  . 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 Anxiety   . Arthritis 12/04/2016   Phreesia 05/23/2020  . Asthma   . Back pain with right-sided sciatica 06/18/2016  . Bipolar disorder (HCC) 01/15/2016   Overview:  Tried Depakote  . Body mass index 36.0-36.9, adult 04/10/2016  . Body mass index 37.0-37.9, adult 03/13/2016  . Depression   . Depressive disorder, not elsewhere classified 03/27/2007  . Dysmenorrhea 04/10/2016  . Family history of adverse reaction to anesthesia    PONV  . GERD (gastroesophageal reflux disease)   .  Hemorrhoids 02/10/2015  . Hiatal hernia   . Hyperlipidemia 04/09/2011  . Hypertension   . Menorrhagia with regular cycle 04/10/2016  . Migraine 01/15/2016   Overview:  Overview:  IMO Load 2016 R1.3  . Muscle spasm of back 03/13/2016  . Numbness and tingling in left hand 08/19/2016  . Obesity   . Ovarian cyst 12/18/2016  . Perianal wart 02/10/2015  . Stress 02/14/2016  . Weight loss counseling, encounter for 03/13/2016    Past Surgical History:  Procedure Laterality Date  . ABLATION    . CESAREAN SECTION    . CESAREAN SECTION N/A    Phreesia 05/23/2020  . CHOLECYSTECTOMY    . COLONOSCOPY    . DILATATION AND CURETTAGE/HYSTEROSCOPY WITH MINERVA N/A 08/31/2019   Procedure: DILATATION AND CURETTAGE (no specimen) /HYSTEROSCOPY WITH MINERVA ENDOMETRIAL ABLATION;  Surgeon: Tilda Burrow, MD;  Location: [facility];  Service: Gynecology;  Laterality: N/A;  . ESOPHAGOGASTRODUODENOSCOPY ENDOSCOPY    . TUBAL LIGATION      Family History  Problem Relation Age of Onset  . Diabetes Mother   . Kidney failure Mother   . Early death Mother 75       diabetes complications  . Cancer Father 40       colon cancer  . Depression Sister   . Hypertension Sister   . Anxiety disorder Sister   . Other Sister        back problems  . ADD / ADHD Daughter   . Other Daughter  behavioral issues  . ODD Daughter   . Asthma Son   . Cancer Maternal Grandmother        breast  . Seizures Maternal Grandmother   . Lupus Other   . Breast cancer Maternal Aunt     Social History Social History   Tobacco Use  . Smoking status: Current Every Day Smoker    Packs/day: 1.00    Years: 20.00    Pack years: 20.00    Types: Cigarettes  . Smokeless tobacco: Never Used  . Tobacco comment: trying to quit  Vaping Use  . Vaping Use: Never used  Substance Use Topics  . Alcohol use: Yes    Comment: occ  . Drug use: No    Allergies  Allergen Reactions  . Bee Venom Shortness Of Breath  . Latex Itching  . Lisinopril  Cough    coughing    Current Outpatient Medications  Medication Sig Dispense Refill  . acetaminophen (TYLENOL) 500 MG tablet Take 1,000 mg by mouth every 6 (six) hours as needed for mild pain, moderate pain or headache.    . albuterol (VENTOLIN HFA) 108 (90 Base) MCG/ACT inhaler Inhale 1-2 puffs into the lungs every 6 (six) hours as needed for wheezing or shortness of breath. 18 g 1  . buPROPion (WELLBUTRIN XL) 150 MG 24 hr tablet Take 1 tablet (150 mg total) by mouth daily. 30 tablet 0  . celecoxib (CELEBREX) 200 MG capsule One daily after eating. 30 capsule 3  . diclofenac (VOLTAREN) 75 MG EC tablet Take 1 tablet (75 mg total) by mouth 2 (two) times daily with a meal. 60 tablet 2  . omeprazole (PRILOSEC) 40 MG capsule Take 40 mg by mouth daily.     No current facility-administered medications for this visit.     Physical Exam  Height 5' (1.524 m), weight 209 lb 8 oz (95 kg).  Constitutional: overall normal hygiene, normal nutrition, well developed, normal grooming, normal body habitus. Assistive device:none  Musculoskeletal: gait and station Limp left, muscle tone and strength are normal, no tremors or atrophy is present.  .  Neurological: coordination overall normal.  Deep tendon reflex/nerve stretch intact.  Sensation normal.  Cranial nerves II-XII intact.   Skin:   Normal overall no scars, lesions, ulcers or rashes. No psoriasis.  Psychiatric: Alert and oriented x 3.  Recent memory intact, remote memory unclear.  Normal mood and affect. Well groomed.  Good eye contact.  Cardiovascular: overall no swelling, no varicosities, no edema bilaterally, normal temperatures of the legs and arms, no clubbing, cyanosis and good capillary refill.  Left knee with pain, effusion, crepitus, ROM 0 to 105, limp left, positive medial Mc[place]ray.  Lymphatic: palpation is normal.  All other systems reviewed and are negative   The patient has been educated about the nature of the problem(s) and  counseled on treatment options.  The patient appeared to understand what I have discussed and is in agreement with it.  Encounter Diagnoses  Name Primary?  . Chronic pain of left knee Yes  . Body mass index 40.0-44.9, adult (HCC)   . Morbid obesity (HCC)   . Nicotine dependence, cigarettes, uncomplicated     PLAN Call if any problems.  Precautions discussed. Begin Mobic  Return to clinic 2 weeks   Get MRI of the knee.  Electronically Signed Darreld Mclean, MD 8/19/20219:46 AM

## 2020-07-27 NOTE — Patient Instructions (Signed)

## 2020-07-28 ENCOUNTER — Ambulatory Visit (INDEPENDENT_AMBULATORY_CARE_PROVIDER_SITE_OTHER): Payer: Medicaid Other | Admitting: Family Medicine

## 2020-07-28 ENCOUNTER — Encounter: Payer: Self-pay | Admitting: Family Medicine

## 2020-07-28 VITALS — BP 135/89 | HR 60 | Resp 16 | Ht 60.0 in | Wt 209.1 lb

## 2020-07-28 DIAGNOSIS — F17218 Nicotine dependence, cigarettes, with other nicotine-induced disorders: Secondary | ICD-10-CM | POA: Diagnosis not present

## 2020-07-28 DIAGNOSIS — J3089 Other allergic rhinitis: Secondary | ICD-10-CM | POA: Diagnosis not present

## 2020-07-28 DIAGNOSIS — J4530 Mild persistent asthma, uncomplicated: Secondary | ICD-10-CM | POA: Diagnosis not present

## 2020-07-28 DIAGNOSIS — Z6841 Body Mass Index (BMI) 40.0 and over, adult: Secondary | ICD-10-CM

## 2020-07-28 MED ORDER — ALBUTEROL SULFATE HFA 108 (90 BASE) MCG/ACT IN AERS: 1.0000 | INHALATION_SPRAY | Freq: Four times a day (QID) | RESPIRATORY_TRACT | 1 refills | Status: DC | PRN

## 2020-07-28 MED ORDER — FLUTICASONE PROPIONATE 50 MCG/ACT NA SUSP: 2.0000 | Freq: Every day | NASAL | 2 refills | Status: DC

## 2020-07-28 MED ORDER — BUPROPION HCL ER (XL) 300 MG PO TB24: 300.0000 mg | ORAL_TABLET | Freq: Every day | ORAL | 0 refills | Status: DC

## 2020-07-28 NOTE — Assessment & Plan Note (Signed)
Refill of Flonase provided.  Encourage smoking sensation completely.

## 2020-07-28 NOTE — Progress Notes (Signed)
Subjective:  Patient ID: Destiny Petty, female    DOB: 12-28-1980  Age: 39 y.o. MRN: [id number]  CC:  Chief Complaint  Patient presents with  . Obesity    weight check      HPI  HPI Ms. Destiny Petty is a 39 year old female patient who comes in today for weight check.  We started her on Wellbutrin at her last visit secondary to having failed treatment with phentermine.  She comes in today weighing 209, down from 217.  Overall Not feeling much better wanting to lose a little bit more weight in this.  But she does report that she has had less cravings for cigarettes being on the Wellbutrin.  And would like to have an increase of this to see if it will eventually help with her food cravings.  Her stress level is very high right now secondary to not having a vehicle and having other things going on in life and she thinks that that might be a component as well.  She continues to walk daily run on the treadmill and exercise and watch what she eats.  Today patient denies signs and symptoms of COVID 19 infection including fever, chills, cough, shortness of breath, and headache. Past Medical, Surgical, Social History, Allergies, and Medications have been Reviewed.   Past Medical History:  Diagnosis Date  . Allergy    SEASONAL  . 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 Anxiety   . Arthritis 12/04/2016   Phreesia 05/23/2020  . Asthma   . Back pain with right-sided sciatica 06/18/2016  . Bipolar disorder (HCC) 01/15/2016   Overview:  Tried Depakote  . Body mass index 36.0-36.9, adult 04/10/2016  . Body mass index 37.0-37.9, adult 03/13/2016  . Depression   . Depressive disorder, not elsewhere classified 03/27/2007  . Dysmenorrhea 04/10/2016  . Family history of adverse reaction to anesthesia    PONV  . GERD (gastroesophageal reflux disease)   . Hemorrhoids 02/10/2015  . Hiatal hernia   . Hyperlipidemia 04/09/2011  . Hypertension   . Menorrhagia with regular cycle 04/10/2016  . Migraine 01/15/2016    Overview:  Overview:  IMO Load 2016 R1.3  . Muscle spasm of back 03/13/2016  . Numbness and tingling in left hand 08/19/2016  . Obesity   . Ovarian cyst 12/18/2016  . Perianal wart 02/10/2015  . Stress 02/14/2016  . Weight loss counseling, encounter for 03/13/2016    Current Meds  Medication Sig  . acetaminophen (TYLENOL) 500 MG tablet Take 1,000 mg by mouth every 6 (six) hours as needed for mild pain, moderate pain or headache.  . albuterol (VENTOLIN HFA) 108 (90 Base) MCG/ACT inhaler Inhale 1-2 puffs into the lungs every 6 (six) hours as needed for wheezing or shortness of breath.  [name] buPROPion (WELLBUTRIN XL) 300 MG 24 hr tablet Take 1 tablet (300 mg total) by mouth daily.  . meloxicam (MOBIC) 15 MG tablet Take 1 tablet (15 mg total) by mouth daily.  [name] omeprazole (PRILOSEC) 40 MG capsule Take 40 mg by mouth daily.  . [DISCONTINUED] albuterol (VENTOLIN HFA) 108 (90 Base) MCG/ACT inhaler Inhale 1-2 puffs into the lungs every 6 (six) hours as needed for wheezing or shortness of breath.  . [DISCONTINUED] buPROPion (WELLBUTRIN XL) 150 MG 24 hr tablet Take 1 tablet (150 mg total) by mouth daily.    ROS:  Review of Systems  HENT: Negative.   Eyes: Negative.   Respiratory: Negative.   Cardiovascular: Negative.   Gastrointestinal: Negative.  Genitourinary: Negative.   Musculoskeletal: Negative.   Skin: Negative.   Neurological: Negative.   Endo/Heme/Allergies: Negative.   Psychiatric/Behavioral: Negative.   All other systems reviewed and are negative.    Objective:   Today's Vitals: BP 135/89   Pulse 60   Resp 16   Ht 5' (1.524 m)   Wt 209 lb 1.9 oz (94.9 kg)   BMI 40.84 kg/m  Vitals with BMI 07/28/2020 07/27/2020 07/11/2020  Height 5\' 0"  5\' 0"  5\' 0"   Weight 209 lbs 2 oz 209 lbs 8 oz 214 lbs  BMI 40.84 40.92 41.79  Systolic 135 - 103  Diastolic 89 - 65  Pulse 60 - 74     Physical Exam Vitals and nursing note reviewed.  Constitutional:      Appearance: Normal appearance. She is  well-developed and well-groomed. She is obese.  HENT:     Head: Normocephalic and atraumatic.     Right Ear: External ear normal.     Left Ear: External ear normal.     Mouth/Throat:     Comments: Mask in place  Eyes:     General:        Right eye: No discharge.        Left eye: No discharge.     Conjunctiva/sclera: Conjunctivae normal.  Cardiovascular:     Rate and Rhythm: Normal rate and regular rhythm.     Pulses: Normal pulses.     Heart sounds: Normal heart sounds.  Pulmonary:     Effort: Pulmonary effort is normal.     Breath sounds: Normal breath sounds.  Musculoskeletal:        General: Normal range of motion.     Cervical back: Normal range of motion and neck supple.  Skin:    General: Skin is warm.  Neurological:     General: No focal deficit present.     Mental Status: She is alert and oriented to person, place, and time.  Psychiatric:        Attention and Perception: Attention normal.        Mood and Affect: Mood normal.        Speech: Speech normal.        Behavior: Behavior normal. Behavior is cooperative.        Thought Content: Thought content normal.        Cognition and Memory: Cognition normal.        Judgment: Judgment normal.    Assessment   1. Class 3 severe obesity due to excess calories with body mass index (BMI) of 40.0 to 44.9 in adult, unspecified whether serious comorbidity present (HCC)   2. Cigarette nicotine dependence with other nicotine-induced disorder   3. Environmental and seasonal allergies   4. Mild persistent asthma without complication     Tests ordered No orders of the defined types were placed in this encounter.  Plan: Hyperlipidemia hypertension Please see assessment and plan per problem list above.   Meds ordered this encounter  Medications  . buPROPion (WELLBUTRIN XL) 300 MG 24 hr tablet    Sig: Take 1 tablet (300 mg total) by mouth daily.    Dispense:  90 tablet    Refill:  0    Order Specific Question:    Supervising Provider    Answer:   SIMPSON, MARGARET E [[id number]]  . fluticasone (FLONASE) 50 MCG/ACT nasal spray    Sig: Place 2 sprays into both nostrils daily.    Dispense:  16 g  Refill:  2    Order Specific Question:   Supervising Provider    Answer:   SIMPSON, MARGARET E [[id number]]  . albuterol (VENTOLIN HFA) 108 (90 Base) MCG/ACT inhaler    Sig: Inhale 1-2 puffs into the lungs every 6 (six) hours as needed for wheezing or shortness of breath.    Dispense:  18 g    Refill:  1    Order Specific Question:   Supervising Provider    Answer:   Kerri Perches [[id number]]    Patient to follow-up in 8/10 weeks .  Freddy Finner, NP

## 2020-07-28 NOTE — Patient Instructions (Addendum)
I appreciate the opportunity to provide you with care for your health and wellness. Today we discussed: weight loss  Follow up: 8-10 weeks med change f/u  No labs or referrals today  Increase Wellbutrin to 300 mg daily. Refill of inhaler and Flonase as well.  I am sorry you have a lot going on right now. I hope it starts to settle out soon.  Please continue to practice social distancing to keep you, your family, and our community safe.  If you must go out, please wear a mask and practice good handwashing.  It was a pleasure to see you and I look forward to continuing to work together on your health and well-being. Please do not hesitate to call the office if you need care or have questions about your care.  Have a wonderful day and week. With Gratitude, Tereasa Coop, DNP, AGNP-BC

## 2020-07-28 NOTE — Assessment & Plan Note (Signed)
Reported use of inhaler 1-2 times a week.  Has some wheezing and shortness of breath secondary to Covid in December.  Not smoking as much as she was secondary to Wellbutrin.  Increase in Wellbutrin dose.  Refill of inhaler.

## 2020-07-28 NOTE — Assessment & Plan Note (Signed)
Limited success on Wellbutrin at this time.  However it is helping her with her smoking.  Increased dose may be that would help with food cravings.  Encouraged that stress levels she needs to make sure that she is eating healthy food and making good food choices as well as exercise.  Improved, obesity associated with hyperlipidemia, hypertension, depression  Destiny Petty is educated about the importance of exercise daily to help with weight management. A minumum of 30 minutes daily is recommended. Additionally, importance of healthy food choices with portion control discussed.   Wt Readings from Last 3 Encounters:  07/28/20 209 lb 1.9 oz (94.9 kg)  07/27/20 209 lb 8 oz (95 kg)  07/11/20 214 lb (97.1 kg)

## 2020-07-28 NOTE — Assessment & Plan Note (Signed)
Improved some reports that Wellbutrin is helping increased dose of Wellbutrin continued use follow-up in 8 to 10 weeks.  Asked about quitting: confirms they are currently smokes cigarettes Advise to quit smoking: Educated about QUITTING to reduce the risk of cancer, cardio and cerebrovascular disease. Assess willingness: Unwilling to quit at this time, but is working on cutting back. Assist with counseling and pharmacotherapy: Counseled for 5 minutes and literature provided. Arrange for follow up: (working on quitting follow up in 3 months and continue to offer help.

## 2020-08-03 ENCOUNTER — Encounter (HOSPITAL_COMMUNITY): Payer: Self-pay | Admitting: *Deleted

## 2020-08-03 ENCOUNTER — Emergency Department (HOSPITAL_COMMUNITY)
Admission: EM | Admit: 2020-08-03 | Discharge: 2020-08-03 | Disposition: A | Discharge status: Home / Self Care | Payer: Medicaid Other | Attending: Emergency Medicine | Admitting: Emergency Medicine

## 2020-08-03 ENCOUNTER — Other Ambulatory Visit: Payer: Self-pay

## 2020-08-03 ENCOUNTER — Telehealth: Payer: Self-pay | Admitting: Orthopedic Surgery

## 2020-08-03 ENCOUNTER — Emergency Department (HOSPITAL_COMMUNITY): Payer: Medicaid Other

## 2020-08-03 DIAGNOSIS — J45909 Unspecified asthma, uncomplicated: Secondary | ICD-10-CM | POA: Diagnosis not present

## 2020-08-03 DIAGNOSIS — S61216A Laceration without foreign body of right little finger without damage to nail, initial encounter: Secondary | ICD-10-CM

## 2020-08-03 DIAGNOSIS — Z91040 Latex allergy status: Secondary | ICD-10-CM | POA: Insufficient documentation

## 2020-08-03 DIAGNOSIS — Y92009 Unspecified place in unspecified non-institutional (private) residence as the place of occurrence of the external cause: Secondary | ICD-10-CM | POA: Diagnosis not present

## 2020-08-03 DIAGNOSIS — W260XXA Contact with knife, initial encounter: Secondary | ICD-10-CM | POA: Diagnosis not present

## 2020-08-03 DIAGNOSIS — S61209A Unspecified open wound of unspecified finger without damage to nail, initial encounter: Secondary | ICD-10-CM

## 2020-08-03 DIAGNOSIS — Z79899 Other long term (current) drug therapy: Secondary | ICD-10-CM | POA: Diagnosis not present

## 2020-08-03 DIAGNOSIS — Y999 Unspecified external cause status: Secondary | ICD-10-CM | POA: Diagnosis not present

## 2020-08-03 DIAGNOSIS — Y93G3 Activity, cooking and baking: Secondary | ICD-10-CM | POA: Insufficient documentation

## 2020-08-03 DIAGNOSIS — I10 Essential (primary) hypertension: Secondary | ICD-10-CM | POA: Diagnosis not present

## 2020-08-03 DIAGNOSIS — S66306A Unspecified injury of extensor muscle, fascia and tendon of right little finger at wrist and hand level, initial encounter: Secondary | ICD-10-CM | POA: Diagnosis present

## 2020-08-03 DIAGNOSIS — F17210 Nicotine dependence, cigarettes, uncomplicated: Secondary | ICD-10-CM | POA: Insufficient documentation

## 2020-08-03 MED ORDER — SULFAMETHOXAZOLE-TRIMETHOPRIM 800-160 MG PO TABS: 1.0000 | ORAL_TABLET | Freq: Two times a day (BID) | ORAL | 0 refills | Status: AC

## 2020-08-03 MED ORDER — POVIDONE-IODINE 10 % EX SOLN
CUTANEOUS | Status: DC | PRN
  Filled 2020-08-03: qty 15

## 2020-08-03 MED ORDER — HYDROCODONE-ACETAMINOPHEN 5-325 MG PO TABS: 1.0000 | ORAL_TABLET | ORAL | 0 refills | Status: DC | PRN

## 2020-08-03 MED ORDER — LIDOCAINE HCL (PF) 2 % IJ SOLN
INTRAMUSCULAR | Status: AC
  Filled 2020-08-03: qty 10

## 2020-08-03 MED ORDER — LIDOCAINE HCL (PF) 2 % IJ SOLN: 5.0000 mL | Freq: Once | INTRAMUSCULAR | Status: DC

## 2020-08-03 MED ORDER — TRANEXAMIC ACID FOR EPISTAXIS
500.0000 mg | Freq: Once | TOPICAL | Status: AC
  Administered 2020-08-03: 500 mg via TOPICAL
  Filled 2020-08-03: qty 10

## 2020-08-03 MED ORDER — SULFAMETHOXAZOLE-TRIMETHOPRIM 800-160 MG PO TABS
1.0000 | ORAL_TABLET | Freq: Once | ORAL | Status: AC
  Administered 2020-08-03: 1 via ORAL
  Filled 2020-08-03: qty 1

## 2020-08-03 NOTE — ED Triage Notes (Signed)
Pt with lac to right pinky finger after knife cut her finger while cooking dinner.  Pt unable to bend finger currently. Bleeding controlled.

## 2020-08-03 NOTE — ED Provider Notes (Signed)
[facility] EMERGENCY DEPARTMENT Provider Note   CSN: [id number] Arrival date & time: 08/03/20  1513     History Chief Complaint  Patient presents with   Laceration    Destiny Petty is a 39 y.o. female presenting for evaluation of finger laceration.  She was trying to cut through a piece of frozen chicken and sustained a laceration to her right proximal volar finger just prior to arrival.  She endorses moderate bleeding from the site and also notes that she has been unable to bend the finger since the injury.  She does have sensation distally but describes a numb sensation as well.  She is current with her tetanus vaccine.  She is right-handed.  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   Anxiety    Arthritis 12/04/2016   Phreesia 05/23/2020   Asthma    Back pain with right-sided sciatica 06/18/2016   Bipolar disorder (HCC) 01/15/2016   Overview:  Tried Depakote   Body mass index 36.0-36.9, adult 04/10/2016   Body mass index 37.0-37.9, adult 03/13/2016   Depression    Depressive disorder, not elsewhere classified 03/27/2007   Dysmenorrhea 04/10/2016   Family history of adverse reaction to anesthesia    PONV   GERD (gastroesophageal reflux disease)    Hemorrhoids 02/10/2015   Hiatal hernia    Hyperlipidemia 04/09/2011   Hypertension    Menorrhagia with regular cycle 04/10/2016   Migraine 01/15/2016   Overview:  Overview:  IMO Load 2016 R1.3   Muscle spasm of back 03/13/2016   Numbness and tingling in left hand 08/19/2016   Obesity    Ovarian cyst 12/18/2016   Perianal wart 02/10/2015   Stress 02/14/2016   Weight loss counseling, encounter for 03/13/2016    Patient Active Problem List   Diagnosis Date Noted   Environmental and seasonal allergies 07/28/2020   Arthritis of left knee 06/01/2020   Screening for diabetes mellitus 06/01/2020   Visit for screening mammogram 06/01/2020   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   Back pain with right-sided sciatica 06/18/2016   Morbid obesity with BMI of 40.0-44.9, adult (HCC) 03/13/2016   Weight loss counseling, encounter for 03/13/2016   Gallstones 01/29/2016   Recurrent major depressive disorder, in full remission (HCC) 01/15/2016   Nicotine addiction 07/10/2013   GERD (gastroesophageal reflux disease) 07/10/2013   HTN (hypertension) 07/08/2013   Asthma, mild persistent 04/05/2013   Orthopnea 12/18/2011   Hyperlipidemia 04/09/2011    Past Surgical History:  Procedure Laterality Date   ABLATION     CESAREAN SECTION     CESAREAN SECTION N/A    Phreesia 05/23/2020   CHOLECYSTECTOMY     COLONOSCOPY     DILATATION AND CURETTAGE/HYSTEROSCOPY WITH MINERVA N/A 08/31/2019   Procedure: DILATATION AND CURETTAGE (no specimen) /HYSTEROSCOPY WITH MINERVA ENDOMETRIAL ABLATION;  Surgeon: Tilda Burrow, MD;  Location: [facility];  Service: Gynecology;  Laterality: N/A;   ESOPHAGOGASTRODUODENOSCOPY ENDOSCOPY     TUBAL LIGATION       OB History    Gravida  2   Para  2   Term  2   Preterm      AB      Living  2     SAB      TAB      Ectopic      Multiple      Live Births  2  Family History  Problem Relation Age of Onset   Diabetes Mother    Kidney failure Mother    Early death Mother 49       diabetes complications   Cancer Father 67       colon cancer   Depression Sister    Hypertension Sister    Anxiety disorder Sister    Other Sister        back problems   ADD / ADHD Daughter    Other Daughter        behavioral issues   ODD Daughter    Asthma Son    Cancer Maternal Grandmother        breast   Seizures Maternal Grandmother    Lupus Other    Breast cancer Maternal Aunt     Social History   Tobacco Use   Smoking status: Current Every Day Smoker    Packs/day: 1.00    Years: 20.00    Pack years: 20.00    Types: Cigarettes   Smokeless  tobacco: Never Used   Tobacco comment: trying to quit  Vaping Use   Vaping Use: Never used  Substance Use Topics   Alcohol use: Yes    Comment: occ   Drug use: No    Home Medications Prior to Admission medications   Medication Sig Start Date End Date Taking? Authorizing Provider  acetaminophen (TYLENOL) 500 MG tablet Take 1,000 mg by mouth every 6 (six) hours as needed for mild pain, moderate pain or headache.    [provider]  albuterol (VENTOLIN HFA) 108 (90 Base) MCG/ACT inhaler Inhale 1-2 puffs into the lungs every 6 (six) hours as needed for wheezing or shortness of breath. 07/28/20   Freddy Finner, NP  buPROPion (WELLBUTRIN XL) 300 MG 24 hr tablet Take 1 tablet (300 mg total) by mouth daily. 07/28/20   Freddy Finner, NP  fluticasone (FLONASE) 50 MCG/ACT nasal spray Place 2 sprays into both nostrils daily. 07/28/20   Freddy Finner, NP  HYDROcodone-acetaminophen (NORCO/VICODIN) 5-325 MG tablet Take 1 tablet by mouth every 4 (four) hours as needed for moderate pain. 08/03/20   [name], PA-C  meloxicam (MOBIC) 15 MG tablet Take 1 tablet (15 mg total) by mouth daily. 07/27/20   Darreld Mclean, MD  omeprazole (PRILOSEC) 40 MG capsule Take 40 mg by mouth daily.    [provider]  sulfamethoxazole-trimethoprim (BACTRIM DS) 800-160 MG tablet Take 1 tablet by mouth 2 (two) times daily for 10 days. 08/03/20 08/13/20  [name], PA-C    Allergies    Bee venom, Latex, and Lisinopril  Review of Systems   Review of Systems  Constitutional: Negative for chills and fever.  Respiratory: Negative for shortness of breath and wheezing.   Skin: Positive for wound.  Neurological: Positive for numbness.  All other systems reviewed and are negative.   Physical Exam Updated Vital Signs BP (!) 144/97 (BP Location: Right Arm)    Pulse 78    Temp 98.6 F (37 C) (Oral)    Resp 20    Ht 5' (1.524 m)    Wt 94.3 kg    SpO2 100%    BMI 40.62 kg/m   Physical  Exam Constitutional:      Appearance: She is well-developed.  HENT:     Head: Normocephalic.  Cardiovascular:     Rate and Rhythm: Normal rate.  Pulmonary:     Effort: Pulmonary effort is normal.  Musculoskeletal:  General: Tenderness present.  Skin:    Findings: Laceration present.     Comments: 2 cm laceration right volar fifth finger across to her mid proximal phalanx.  There is a tiny pulsatile bleeding coming from the mid wound.  Distal sensation is present but reduced, patient describing a tingly sensation.  She is unable to flex her DIP and her PIP.  full flexion of MCP present.  Neurological:     Mental Status: She is alert and oriented to person, place, and time.     Sensory: No sensory deficit.     ED Results / Procedures / Treatments   Labs (all labs ordered are listed, but only abnormal results are displayed) Labs Reviewed - No data to display  EKG None  Radiology DG Finger Little Right  Result Date: 08/03/2020 CLINICAL DATA:  Laceration EXAM: RIGHT LITTLE FINGER 2+V COMPARISON:  None. FINDINGS: Soft tissue laceration within the right little finger. No radiopaque foreign body. No fracture, subluxation or dislocation. IMPRESSION: No acute bony abnormality or foreign body. Electronically Signed   By: Charlett Nose M.D.   On: 08/03/2020 16:21    Procedures Procedures (including critical care time)  LACERATION REPAIR Performed by: [name] Authorized by: [name] Consent: Verbal consent obtained. Risks and benefits: risks, benefits and alternatives were discussed Consent given by: patient Patient identity confirmed: provided demographic data Prepped and Draped in normal sterile fashion Wound explored  Laceration Location: right pinky finger, proximal phalanx, volar  Laceration Length: 2cm  No Foreign Bodies seen or palpated.  Finger cot was required to allow adequate visualization of the wound. Volar tendon edge identified.  TXA applied for full  control of bleeding.   Anesthesia: local infiltration  Local anesthetic: lidocaine 1% without epinephrine  Anesthetic total: 2 ml  Irrigation method: syringe Amount of cleaning: standard  Skin closure: ethilon 4-0  Number of sutures: 4  Technique: simple interrupted  Patient tolerance: Patient tolerated the procedure well with no immediate complications.  Medications Ordered in ED Medications  povidone-iodine (BETADINE) 10 % external solution (has no administration in time range)  lidocaine HCl (PF) (XYLOCAINE) 2 % injection 5 mL (has no administration in time range)  tranexamic acid (CYKLOKAPRON) 1000 MG/10ML topical solution 500 mg (500 mg Topical Given 08/03/20 1730)  sulfamethoxazole-trimethoprim (BACTRIM DS) 800-160 MG per tablet 1 tablet (1 tablet Oral Given 08/03/20 1731)    ED Course  I have reviewed the triage vital signs and the nursing notes.  Pertinent labs & imaging results that were available during my care of the patient were reviewed by me and considered in my medical decision making (see chart for details).    MDM Rules/Calculators/A&P                          Pt with finger laceration with deficit in finger flexion, suspected tendon injury. Discussed with Dr. Merlyn Lot who will see pt in office in close f/u.  Placed in dorsal finger splint with finger in flexion.  Bactrim.  Pt understands to call office in am for f/u care.  Final Clinical Impression(s) / ED Diagnoses Final diagnoses:  Laceration of right little finger without foreign body without damage to nail, initial encounter  Open wound of finger with tendon injury, initial encounter    Rx / DC Orders ED Discharge Orders         Ordered    HYDROcodone-acetaminophen (NORCO/VICODIN) 5-325 MG tablet  Every 4 hours PRN,  Status:  Discontinued        08/03/20 1718    sulfamethoxazole-trimethoprim (BACTRIM DS) 800-160 MG tablet  2 times daily        08/03/20 1718    HYDROcodone-acetaminophen  (NORCO/VICODIN) 5-325 MG tablet  Every 4 hours PRN        08/03/20 1722           Victoriano Laindol, Mesiah Manzo, PA-C 08/03/20 Janina [id number]    Miller, Brian, MD 08/04/20 [phone]

## 2020-08-03 NOTE — Telephone Encounter (Signed)
This is a Dr. Hilda Lias patient and she has been on Meloxicam and Flexeril with no relief. She has tried over the counter Aspercreme and Voltaren gel with no relief. Do you have any other recommendations? She is waiting on an MRI to be scheduled and is in pain in her left knee. Please advise.

## 2020-08-03 NOTE — Discharge Instructions (Addendum)
Keep your finger clean and dry and wear the splint at all times until follow-up with Dr. Merlyn Lot.  Call his office tomorrow morning for an appointment time.  Take your next dose of the antibiotics tomorrow morning.  You may use the hydrocodone if needed for pain relief, this will make you drowsy.  Do not drive within 4 hours of taking this medicine.  Ice and elevation will also help with pain and throbbing.

## 2020-08-07 ENCOUNTER — Telehealth: Payer: Self-pay

## 2020-08-07 ENCOUNTER — Encounter: Payer: Self-pay | Admitting: Emergency Medicine

## 2020-08-07 ENCOUNTER — Other Ambulatory Visit: Payer: Self-pay

## 2020-08-07 ENCOUNTER — Telehealth: Payer: Self-pay | Admitting: *Deleted

## 2020-08-07 ENCOUNTER — Ambulatory Visit
Admission: EM | Admit: 2020-08-07 | Discharge: 2020-08-07 | Disposition: A | Discharge status: Home / Self Care | Payer: Medicaid Other | Attending: Emergency Medicine | Admitting: Emergency Medicine

## 2020-08-07 DIAGNOSIS — Z5189 Encounter for other specified aftercare: Secondary | ICD-10-CM | POA: Diagnosis not present

## 2020-08-07 DIAGNOSIS — S61219A Laceration without foreign body of unspecified finger without damage to nail, initial encounter: Secondary | ICD-10-CM

## 2020-08-07 DIAGNOSIS — S61216D Laceration without foreign body of right little finger without damage to nail, subsequent encounter: Secondary | ICD-10-CM

## 2020-08-07 MED ORDER — MELOXICAM 15 MG PO TABS: 15.0000 mg | ORAL_TABLET | Freq: Every day | ORAL | 0 refills | Status: DC

## 2020-08-07 NOTE — ED Triage Notes (Addendum)
Pt had injury to RT fifth finger on 08/26.  Pt has area wrapped in coban and states area felt numb and she was unable to move it before she left the hospital on 08/26 and has continued to feel numb and has no movement.  appt with specialist on Wednesday.  Pt has finger wrapped with same dressing she was discharged from the hospital with.

## 2020-08-07 NOTE — Discharge Instructions (Signed)
Looser dressing applied Mobic prescribed for pain Keep clean and covered Continue with antibiotic as prescribed.   Follow up with specialist on Wednesday Return or go to the ED if you have any new or worsening symptoms such as increased pain, redness, swelling, drainage, discharge, pallor, cold to the touch, goose bumps to digits, worsening numbness or tingling, etc..

## 2020-08-07 NOTE — Telephone Encounter (Signed)
    MA8/30/2021 1st Attempt  Name: Destiny Petty   MRN: [id number]   DOB: 1981-07-14   AGE: 39 y.o.   GENDER: female   PCP Freddy Finner, NP.   08/07/20 Spoke with patient she stated that her son will transport her to the appointment with Dr. Merlyn Lot on Sept 1.  No other resources needed at this time.  Closing referral.    Shabrea Weldin, AAS Paralegal, [facility] Care Guide . Embedded Care Coordination [facility]  Care Management  [address] [place], [place] [address] [email or web address]  [email or web address]   [email or web address]

## 2020-08-07 NOTE — ED Provider Notes (Signed)
[facility] CARE CENTER   [id number] 08/07/20 Arrival Time: 1316  CC: Wound check  SUBJECTIVE:  Destiny Petty is a 39 y.o. female who presents for wound check.  Cut RT hand little finger on 8/26 with knife.  States she has numbness in the tip of her finger, and she is unable to move her little finger.  However, that was present after the initial injury.  Has appt with specialist this Wednesday.   Currently on antibiotic.  Denies fever, chills, nausea, vomiting, redness, swelling, purulent drainage, pallor, coldness, goose bumps.    ROS: As per HPI.  All other pertinent ROS negative.     Past Medical History:  Diagnosis Date  . Allergy    SEASONAL  . 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 Anxiety   . Arthritis 12/04/2016   Phreesia 05/23/2020  . Asthma   . Back pain with right-sided sciatica 06/18/2016  . Bipolar disorder (HCC) 01/15/2016   Overview:  Tried Depakote  . Body mass index 36.0-36.9, adult 04/10/2016  . Body mass index 37.0-37.9, adult 03/13/2016  . Depression   . Depressive disorder, not elsewhere classified 03/27/2007  . Dysmenorrhea 04/10/2016  . Family history of adverse reaction to anesthesia    PONV  . GERD (gastroesophageal reflux disease)   . Hemorrhoids 02/10/2015  . Hiatal hernia   . Hyperlipidemia 04/09/2011  . Hypertension   . Menorrhagia with regular cycle 04/10/2016  . Migraine 01/15/2016   Overview:  Overview:  IMO Load 2016 R1.3  . Muscle spasm of back 03/13/2016  . Numbness and tingling in left hand 08/19/2016  . Obesity   . Ovarian cyst 12/18/2016  . Perianal wart 02/10/2015  . Stress 02/14/2016  . Weight loss counseling, encounter for 03/13/2016   Past Surgical History:  Procedure Laterality Date  . ABLATION    . CESAREAN SECTION    . CESAREAN SECTION N/A    Phreesia 05/23/2020  . CHOLECYSTECTOMY    . COLONOSCOPY    . DILATATION AND CURETTAGE/HYSTEROSCOPY WITH MINERVA N/A 08/31/2019   Procedure: DILATATION AND CURETTAGE (no specimen) /HYSTEROSCOPY WITH MINERVA  ENDOMETRIAL ABLATION;  Surgeon: Tilda Burrow, MD;  Location: [facility];  Service: Gynecology;  Laterality: N/A;  . ESOPHAGOGASTRODUODENOSCOPY ENDOSCOPY    . TUBAL LIGATION     Allergies  Allergen Reactions  . Bee Venom Shortness Of Breath  . Latex Itching  . Lisinopril Cough    coughing   No current facility-administered medications on file prior to encounter.   Current Outpatient Medications on File Prior to Encounter  Medication Sig Dispense Refill  . acetaminophen (TYLENOL) 500 MG tablet Take 1,000 mg by mouth every 6 (six) hours as needed for mild pain, moderate pain or headache.    . albuterol (VENTOLIN HFA) 108 (90 Base) MCG/ACT inhaler Inhale 1-2 puffs into the lungs every 6 (six) hours as needed for wheezing or shortness of breath. 18 g 1  . buPROPion (WELLBUTRIN XL) 300 MG 24 hr tablet Take 1 tablet (300 mg total) by mouth daily. 90 tablet 0  . fluticasone (FLONASE) 50 MCG/ACT nasal spray Place 2 sprays into both nostrils daily. 16 g 2  . HYDROcodone-acetaminophen (NORCO/VICODIN) 5-325 MG tablet Take 1 tablet by mouth every 4 (four) hours as needed for moderate pain. 10 tablet 0  . omeprazole (PRILOSEC) 40 MG capsule Take 40 mg by mouth daily.    [name] sulfamethoxazole-trimethoprim (BACTRIM DS) 800-160 MG tablet Take 1 tablet by mouth 2 (two) times daily for 10 days. 20 tablet 0  Social History   Socioeconomic History  . Marital status: Legally Separated    Spouse name: Not on file  . Number of children: 2  . Years of education: 66  . Highest education level: Not on file  Occupational History  . Not on file  Tobacco Use  . Smoking status: Current Every Day Smoker    Packs/day: 1.00    Years: 20.00    Pack years: 20.00    Types: Cigarettes  . Smokeless tobacco: Never Used  . Tobacco comment: trying to quit  Vaping Use  . Vaping Use: Never used  Substance and Sexual Activity  . Alcohol use: Yes    Comment: occ  . Drug use: No  . Sexual activity: Yes    Birth  control/protection: Surgical    Comment: tubal  Other Topics Concern  . 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 Difficulty of Paying Living Expenses: Not hard at all  Food Insecurity: No Food Insecurity  . Worried About Programme researcher, broadcasting/film/video in the Last Year: Never true  . Ran Out of Food in the Last Year: Never true  Transportation Needs: No Transportation Needs  . Lack of Transportation (Medical): No  . Lack of Transportation (Non-Medical): No  Physical Activity: Insufficiently Active  . Days of Exercise per Week: 2 days  . Minutes of Exercise per Session: 30 min  Stress: No Stress Concern Present  . Feeling of Stress : Not at all  Social Connections: Moderately Isolated  . Frequency of Communication with Friends and Family: More than three times a week  . Frequency of Social Gatherings with Friends and Family: More than three times a week  . Attends Religious Services: More than 4 times per year  . Active Member of Clubs or Organizations: No  . Attends Banker Meetings: Never  . Marital Status: Separated  Intimate Partner Violence: Not At Risk  . Fear of Current or Ex-Partner: No  . Emotionally Abused: No  . Physically Abused: No  . Sexually Abused: No   Family History  Problem Relation Age of Onset  . Diabetes Mother   . Kidney failure Mother   . Early death Mother 34       diabetes complications  . Cancer Father 71       colon cancer  . Depression Sister   . Hypertension Sister   . Anxiety disorder  Sister   . Other Sister        back problems  . ADD / ADHD Daughter   . Other Daughter        behavioral issues  . ODD Daughter   . Asthma Son   . Cancer Maternal Grandmother        breast  . Seizures Maternal Grandmother   .  Lupus Other   . Breast cancer Maternal Aunt      OBJECTIVE:  Vitals:   08/07/20 1418 08/07/20 1422  BP:  125/78  Pulse:  86  Resp:  19  Temp:  98 F (36.7 C)  TempSrc:  Oral  SpO2:  95%  Weight: 208 lb (94.3 kg)   Height: 5' (1.524 m)      General appearance: alert; no distress CV: Radial pulse 2+; cap refill intact; digit warm to the touch Skin: healing laceration with sutures in place to proximal palmar aspect of fifth digit, mild discomfort over distal tip of fifth digit, no obvious erythema, discharge, or bleeding Psychological: alert and cooperative; normal mood and affect  ASSESSMENT & PLAN:  1. Visit for wound check   2. Laceration of right little finger without foreign body without damage to nail, subsequent encounter     Meds ordered this encounter  Medications  . meloxicam (MOBIC) 15 MG tablet    Sig: Take 1 tablet (15 mg total) by mouth daily.    Dispense:  20 tablet    Refill:  0    Order Specific Question:   Supervising Provider    Answer:   Eustace Moore [[id number]]    Looser dressing applied Mobic prescribed for pain Keep clean and covered Continue with antibiotic as prescribed.   Follow up with specialist on Wednesday Return or go to the ED if you have any new or worsening symptoms such as increased pain, redness, swelling, drainage, discharge, pallor, cold to the touch, goose bumps to digits, worsening numbness or tingling, etc..     Reviewed expectations re: course of current medical issues. Questions answered. Outlined signs and symptoms indicating need for more acute intervention. Patient verbalized understanding. After Visit Summary given.   Rennis Harding, PA-C 08/07/20 1449

## 2020-08-07 NOTE — Telephone Encounter (Addendum)
Contacted patient to complete transition of care assessent:  Transition Care Management Follow-up Telephone Call  . Medicaid Managed Care Transition Call Status:MM [facility] Call Made  . Date of discharge and from where: [facility], 08/03/20  . How have you been since you were released from the hospital? "My hand has been hurting a lot. The metal has cut through the bandage. My hand is tight, and my finger is numb". Patient advised to contact PCP and Dr Polo Riley regarding symptoms. She verbalized understanding.  . Any questions or concerns? No  Items Reviewed: [name] Did the pt receive and understand the discharge instructions provided? Yes  . Medications obtained and verified? Yes  . Any new allergies since your discharge? No  . Dietary orders reviewed? No . Do you have support at home? Yes, family  Functional Questionnaire: (I = Independent and D = Dependent)  ADLs: Independent Bathing/Dressing:Independent Meal Prep: Independent Eating: Independent Maintaining continence: Independent Transferring/Ambulation: Independent Managing Meds: Independent   Follow up appointments reviewed:   PCP Hospital f/u appt confirmed? No Patient to call office of Tereasa Coop on 08/07/20 regarding symptoms and to schedule follow up appt  Specialist Hospital f/u appt confirmed? Yes, patient scheduled to see Dr Armen Pickup on 08/09/20 @ 1000.  Are transportation arrangements needed? Yes , she may need transportation to appt because she has been without a car for a month.   If their condition worsens, is the pt aware to call PCP or go to the EmergencyDept.?  Yes  Was the patient provided with contact information for the PCP's office or ED? Yes  Was to pt encouraged to call back with questions or concerns? Yes   Order placed for Ambulatory Referral to Connected Care due to transportation needs.

## 2020-08-07 NOTE — Telephone Encounter (Signed)
Patient is aware and will call the scheduling to see about an appointment. She did not have any questions. She reprots she has been to the hospital for pain and it did not help. No other concerns.

## 2020-08-09 ENCOUNTER — Other Ambulatory Visit: Payer: Self-pay

## 2020-08-09 ENCOUNTER — Other Ambulatory Visit: Payer: Self-pay | Admitting: Orthopedic Surgery

## 2020-08-09 ENCOUNTER — Other Ambulatory Visit (HOSPITAL_COMMUNITY)
Admission: RE | Admit: 2020-08-09 | Discharge: 2020-08-09 | Disposition: A | Discharge status: Home / Self Care | Payer: Medicaid Other | Source: Ambulatory Visit | Attending: Orthopedic Surgery | Admitting: Orthopedic Surgery

## 2020-08-09 ENCOUNTER — Encounter (HOSPITAL_BASED_OUTPATIENT_CLINIC_OR_DEPARTMENT_OTHER): Payer: Self-pay | Admitting: Orthopedic Surgery

## 2020-08-09 DIAGNOSIS — S64496A Injury of digital nerve of right little finger, initial encounter: Secondary | ICD-10-CM | POA: Diagnosis not present

## 2020-08-09 DIAGNOSIS — S66126A Laceration of flexor muscle, fascia and tendon of right little finger at wrist and hand level, initial encounter: Secondary | ICD-10-CM | POA: Diagnosis not present

## 2020-08-09 DIAGNOSIS — Z01812 Encounter for preprocedural laboratory examination: Secondary | ICD-10-CM | POA: Insufficient documentation

## 2020-08-09 DIAGNOSIS — Z20822 Contact with and (suspected) exposure to covid-19: Secondary | ICD-10-CM | POA: Insufficient documentation

## 2020-08-09 LAB — SARS CORONAVIRUS 2 (TAT 6-24 HRS): SARS Coronavirus 2: NEGATIVE

## 2020-08-10 ENCOUNTER — Ambulatory Visit (HOSPITAL_BASED_OUTPATIENT_CLINIC_OR_DEPARTMENT_OTHER): Payer: Medicaid Other | Admitting: Anesthesiology

## 2020-08-10 ENCOUNTER — Ambulatory Visit: Payer: Medicaid Other | Admitting: Orthopaedic Surgery

## 2020-08-10 ENCOUNTER — Encounter (HOSPITAL_BASED_OUTPATIENT_CLINIC_OR_DEPARTMENT_OTHER)
Admission: RE | Disposition: A | Discharge status: Home / Self Care | Payer: Self-pay | Source: Home / Self Care | Attending: Orthopedic Surgery

## 2020-08-10 ENCOUNTER — Encounter (HOSPITAL_BASED_OUTPATIENT_CLINIC_OR_DEPARTMENT_OTHER): Payer: Self-pay | Admitting: Orthopedic Surgery

## 2020-08-10 ENCOUNTER — Other Ambulatory Visit: Payer: Self-pay

## 2020-08-10 ENCOUNTER — Ambulatory Visit (HOSPITAL_BASED_OUTPATIENT_CLINIC_OR_DEPARTMENT_OTHER)
Admission: RE | Admit: 2020-08-10 | Discharge: 2020-08-10 | Disposition: A | Discharge status: Home / Self Care | Payer: Medicaid Other | Attending: Orthopedic Surgery | Admitting: Orthopedic Surgery

## 2020-08-10 DIAGNOSIS — Y9289 Other specified places as the place of occurrence of the external cause: Secondary | ICD-10-CM | POA: Insufficient documentation

## 2020-08-10 DIAGNOSIS — Z818 Family history of other mental and behavioral disorders: Secondary | ICD-10-CM | POA: Insufficient documentation

## 2020-08-10 DIAGNOSIS — Z8249 Family history of ischemic heart disease and other diseases of the circulatory system: Secondary | ICD-10-CM | POA: Insufficient documentation

## 2020-08-10 DIAGNOSIS — Z7951 Long term (current) use of inhaled steroids: Secondary | ICD-10-CM | POA: Insufficient documentation

## 2020-08-10 DIAGNOSIS — S64496A Injury of digital nerve of right little finger, initial encounter: Secondary | ICD-10-CM | POA: Insufficient documentation

## 2020-08-10 DIAGNOSIS — F419 Anxiety disorder, unspecified: Secondary | ICD-10-CM | POA: Insufficient documentation

## 2020-08-10 DIAGNOSIS — Z91040 Latex allergy status: Secondary | ICD-10-CM | POA: Diagnosis not present

## 2020-08-10 DIAGNOSIS — Y93G3 Activity, cooking and baking: Secondary | ICD-10-CM | POA: Diagnosis not present

## 2020-08-10 DIAGNOSIS — S61216A Laceration without foreign body of right little finger without damage to nail, initial encounter: Secondary | ICD-10-CM | POA: Diagnosis present

## 2020-08-10 DIAGNOSIS — S66126A Laceration of flexor muscle, fascia and tendon of right little finger at wrist and hand level, initial encounter: Secondary | ICD-10-CM | POA: Insufficient documentation

## 2020-08-10 DIAGNOSIS — Z91030 Bee allergy status: Secondary | ICD-10-CM | POA: Insufficient documentation

## 2020-08-10 DIAGNOSIS — Z841 Family history of disorders of kidney and ureter: Secondary | ICD-10-CM | POA: Insufficient documentation

## 2020-08-10 DIAGNOSIS — F17210 Nicotine dependence, cigarettes, uncomplicated: Secondary | ICD-10-CM | POA: Diagnosis not present

## 2020-08-10 DIAGNOSIS — E669 Obesity, unspecified: Secondary | ICD-10-CM | POA: Diagnosis not present

## 2020-08-10 DIAGNOSIS — Z800 Family history of malignant neoplasm of digestive organs: Secondary | ICD-10-CM | POA: Diagnosis not present

## 2020-08-10 DIAGNOSIS — F319 Bipolar disorder, unspecified: Secondary | ICD-10-CM | POA: Diagnosis not present

## 2020-08-10 DIAGNOSIS — Z79899 Other long term (current) drug therapy: Secondary | ICD-10-CM | POA: Insufficient documentation

## 2020-08-10 DIAGNOSIS — J45909 Unspecified asthma, uncomplicated: Secondary | ICD-10-CM | POA: Insufficient documentation

## 2020-08-10 DIAGNOSIS — E785 Hyperlipidemia, unspecified: Secondary | ICD-10-CM | POA: Diagnosis not present

## 2020-08-10 DIAGNOSIS — S65011A Laceration of ulnar artery at wrist and hand level of right arm, initial encounter: Secondary | ICD-10-CM | POA: Insufficient documentation

## 2020-08-10 DIAGNOSIS — Z9049 Acquired absence of other specified parts of digestive tract: Secondary | ICD-10-CM | POA: Diagnosis not present

## 2020-08-10 DIAGNOSIS — S65516A Laceration of blood vessel of right little finger, initial encounter: Secondary | ICD-10-CM | POA: Diagnosis not present

## 2020-08-10 DIAGNOSIS — K219 Gastro-esophageal reflux disease without esophagitis: Secondary | ICD-10-CM | POA: Diagnosis not present

## 2020-08-10 DIAGNOSIS — Z8489 Family history of other specified conditions: Secondary | ICD-10-CM | POA: Insufficient documentation

## 2020-08-10 DIAGNOSIS — Z803 Family history of malignant neoplasm of breast: Secondary | ICD-10-CM | POA: Insufficient documentation

## 2020-08-10 DIAGNOSIS — Z6841 Body Mass Index (BMI) 40.0 and over, adult: Secondary | ICD-10-CM | POA: Diagnosis not present

## 2020-08-10 DIAGNOSIS — M1990 Unspecified osteoarthritis, unspecified site: Secondary | ICD-10-CM | POA: Diagnosis not present

## 2020-08-10 DIAGNOSIS — I10 Essential (primary) hypertension: Secondary | ICD-10-CM | POA: Insufficient documentation

## 2020-08-10 DIAGNOSIS — S56127A Laceration of flexor muscle, fascia and tendon of right little finger at forearm level, initial encounter: Secondary | ICD-10-CM | POA: Diagnosis not present

## 2020-08-10 DIAGNOSIS — Z825 Family history of asthma and other chronic lower respiratory diseases: Secondary | ICD-10-CM | POA: Insufficient documentation

## 2020-08-10 DIAGNOSIS — W260XXA Contact with knife, initial encounter: Secondary | ICD-10-CM | POA: Diagnosis not present

## 2020-08-10 DIAGNOSIS — G43909 Migraine, unspecified, not intractable, without status migrainosus: Secondary | ICD-10-CM | POA: Insufficient documentation

## 2020-08-10 DIAGNOSIS — K449 Diaphragmatic hernia without obstruction or gangrene: Secondary | ICD-10-CM | POA: Diagnosis not present

## 2020-08-10 DIAGNOSIS — Z888 Allergy status to other drugs, medicaments and biological substances status: Secondary | ICD-10-CM | POA: Insufficient documentation

## 2020-08-10 DIAGNOSIS — Z833 Family history of diabetes mellitus: Secondary | ICD-10-CM | POA: Diagnosis not present

## 2020-08-10 HISTORY — PX: TENDON EXPLORATION: SHX5112

## 2020-08-10 HISTORY — DX: Laceration without foreign body of unspecified finger without damage to nail, initial encounter: S61.219A

## 2020-08-10 SURGERY — EXPLORATION, TENDON
Anesthesia: General | Site: Little Finger | Laterality: Left

## 2020-08-10 MED ORDER — MEPERIDINE HCL 25 MG/ML IJ SOLN: 6.2500 mg | INTRAMUSCULAR | Status: DC | PRN

## 2020-08-10 MED ORDER — HYDROCODONE-ACETAMINOPHEN 5-325 MG PO TABS
ORAL_TABLET | ORAL | 0 refills | Status: DC
Start: 2020-08-10 — End: 2020-08-29

## 2020-08-10 MED ORDER — MIDAZOLAM HCL 2 MG/2ML IJ SOLN
INTRAMUSCULAR | Status: AC
  Filled 2020-08-10: qty 2

## 2020-08-10 MED ORDER — LIDOCAINE HCL (CARDIAC) PF 100 MG/5ML IV SOSY
PREFILLED_SYRINGE | INTRAVENOUS | Status: DC | PRN
  Administered 2020-08-10: 60 mg via INTRAVENOUS

## 2020-08-10 MED ORDER — BUPIVACAINE HCL (PF) 0.25 % IJ SOLN
INTRAMUSCULAR | Status: DC | PRN
  Administered 2020-08-10: 7 mL

## 2020-08-10 MED ORDER — 0.9 % SODIUM CHLORIDE (POUR BTL) OPTIME
TOPICAL | Status: DC | PRN
  Administered 2020-08-10: 1000 mL

## 2020-08-10 MED ORDER — LIDOCAINE 2% (20 MG/ML) 5 ML SYRINGE
INTRAMUSCULAR | Status: AC
  Filled 2020-08-10: qty 5

## 2020-08-10 MED ORDER — OXYCODONE HCL 5 MG PO TABS
5.0000 mg | ORAL_TABLET | Freq: Once | ORAL | Status: AC | PRN
  Administered 2020-08-10: 5 mg via ORAL

## 2020-08-10 MED ORDER — FENTANYL CITRATE (PF) 100 MCG/2ML IJ SOLN
INTRAMUSCULAR | Status: AC
  Filled 2020-08-10: qty 2

## 2020-08-10 MED ORDER — DEXAMETHASONE SODIUM PHOSPHATE 10 MG/ML IJ SOLN
INTRAMUSCULAR | Status: AC
  Filled 2020-08-10: qty 1

## 2020-08-10 MED ORDER — PROPOFOL 10 MG/ML IV BOLUS
INTRAVENOUS | Status: DC | PRN
  Administered 2020-08-10: 150 mg via INTRAVENOUS
  Administered 2020-08-10: 50 mg via INTRAVENOUS

## 2020-08-10 MED ORDER — FENTANYL CITRATE (PF) 100 MCG/2ML IJ SOLN
INTRAMUSCULAR | Status: DC | PRN
  Administered 2020-08-10: 25 ug via INTRAVENOUS
  Administered 2020-08-10: 100 ug via INTRAVENOUS
  Administered 2020-08-10: 25 ug via INTRAVENOUS
  Administered 2020-08-10: 50 ug via INTRAVENOUS

## 2020-08-10 MED ORDER — ONDANSETRON HCL 4 MG/2ML IJ SOLN
INTRAMUSCULAR | Status: AC
  Filled 2020-08-10: qty 2

## 2020-08-10 MED ORDER — PROMETHAZINE HCL 25 MG/ML IJ SOLN: 6.2500 mg | INTRAMUSCULAR | Status: DC | PRN

## 2020-08-10 MED ORDER — HYDROMORPHONE HCL 1 MG/ML IJ SOLN
INTRAMUSCULAR | Status: AC
  Filled 2020-08-10: qty 0.5

## 2020-08-10 MED ORDER — PROPOFOL 10 MG/ML IV BOLUS
INTRAVENOUS | Status: AC
  Filled 2020-08-10: qty 20

## 2020-08-10 MED ORDER — LACTATED RINGERS IV SOLN: INTRAVENOUS | Status: DC

## 2020-08-10 MED ORDER — OXYCODONE HCL 5 MG PO TABS
ORAL_TABLET | ORAL | Status: AC
  Filled 2020-08-10: qty 1

## 2020-08-10 MED ORDER — OXYCODONE HCL 5 MG/5ML PO SOLN: 5.0000 mg | Freq: Once | ORAL | Status: AC | PRN

## 2020-08-10 MED ORDER — DEXAMETHASONE SODIUM PHOSPHATE 10 MG/ML IJ SOLN
INTRAMUSCULAR | Status: DC | PRN
  Administered 2020-08-10: 10 mg via INTRAVENOUS

## 2020-08-10 MED ORDER — MIDAZOLAM HCL 5 MG/5ML IJ SOLN
INTRAMUSCULAR | Status: DC | PRN
  Administered 2020-08-10: 2 mg via INTRAVENOUS

## 2020-08-10 MED ORDER — AMISULPRIDE (ANTIEMETIC) 5 MG/2ML IV SOLN: 10.0000 mg | Freq: Once | INTRAVENOUS | Status: DC | PRN

## 2020-08-10 MED ORDER — ONDANSETRON HCL 4 MG/2ML IJ SOLN
INTRAMUSCULAR | Status: DC | PRN
  Administered 2020-08-10: 4 mg via INTRAVENOUS

## 2020-08-10 MED ORDER — HYDROMORPHONE HCL 1 MG/ML IJ SOLN
0.2500 mg | INTRAMUSCULAR | Status: DC | PRN
  Administered 2020-08-10 (×3): 0.5 mg via INTRAVENOUS

## 2020-08-10 MED ORDER — CEFAZOLIN SODIUM-DEXTROSE 2-4 GM/100ML-% IV SOLN
INTRAVENOUS | Status: AC
  Filled 2020-08-10: qty 100

## 2020-08-10 MED ORDER — CEFAZOLIN SODIUM-DEXTROSE 2-4 GM/100ML-% IV SOLN
2.0000 g | INTRAVENOUS | Status: AC
  Administered 2020-08-10: 2 g via INTRAVENOUS

## 2020-08-10 SURGICAL SUPPLY — 62 items
BAG DECANTER FOR FLEXI CONT (MISCELLANEOUS) IMPLANT
BLADE MINI RND TIP GREEN BEAV (BLADE) IMPLANT
BLADE SURG 15 STRL LF DISP TIS (BLADE) ×2 IMPLANT
BLADE SURG 15 STRL SS (BLADE) ×4
BNDG ELASTIC 3X5.8 VLCR STR LF (GAUZE/BANDAGES/DRESSINGS) ×3 IMPLANT
BNDG ESMARK 4X9 LF (GAUZE/BANDAGES/DRESSINGS) IMPLANT
BNDG GAUZE ELAST 4 BULKY (GAUZE/BANDAGES/DRESSINGS) ×3 IMPLANT
BRUSH SCRUB EZ PLAIN DRY (MISCELLANEOUS) ×3 IMPLANT
CATH ROBINSON RED A/P 10FR (CATHETERS) IMPLANT
CHLORAPREP W/TINT 26 (MISCELLANEOUS) ×3 IMPLANT
CORD BIPOLAR FORCEPS 12FT (ELECTRODE) ×3 IMPLANT
COVER BACK TABLE 60X90IN (DRAPES) ×3 IMPLANT
COVER MAYO STAND STRL (DRAPES) ×3 IMPLANT
COVER WAND RF STERILE (DRAPES) IMPLANT
CUFF TOURN SGL QUICK 18X4 (TOURNIQUET CUFF) IMPLANT
DECANTER SPIKE VIAL GLASS SM (MISCELLANEOUS) ×3 IMPLANT
DRAPE EXTREMITY T 121X128X90 (DISPOSABLE) ×3 IMPLANT
DRAPE SURG 17X23 STRL (DRAPES) ×3 IMPLANT
GAUZE SPONGE 4X4 12PLY STRL (GAUZE/BANDAGES/DRESSINGS) ×3 IMPLANT
GAUZE XEROFORM 1X8 LF (GAUZE/BANDAGES/DRESSINGS) ×3 IMPLANT
GLOVE BIO SURGEON STRL SZ7.5 (GLOVE) ×3 IMPLANT
GLOVE BIOGEL PI IND STRL 8 (GLOVE) ×1 IMPLANT
GLOVE BIOGEL PI IND STRL 8.5 (GLOVE) IMPLANT
GLOVE BIOGEL PI INDICATOR 8 (GLOVE) ×2
GLOVE BIOGEL PI INDICATOR 8.5 (GLOVE)
GLOVE SURG ORTHO 8.0 STRL STRW (GLOVE) IMPLANT
GOWN STRL REUS W/ TWL LRG LVL3 (GOWN DISPOSABLE) ×1 IMPLANT
GOWN STRL REUS W/TWL LRG LVL3 (GOWN DISPOSABLE) ×2
GOWN STRL REUS W/TWL XL LVL3 (GOWN DISPOSABLE) ×3 IMPLANT
LOOP VESSEL MAXI BLUE (MISCELLANEOUS) IMPLANT
NDL SAFETY ECLIPSE 18X1.5 (NEEDLE) IMPLANT
NEEDLE HYPO 18GX1.5 SHARP (NEEDLE)
NEEDLE HYPO 25X1 1.5 SAFETY (NEEDLE) IMPLANT
NS IRRIG 1000ML POUR BTL (IV SOLUTION) ×3 IMPLANT
PACK BASIN DAY SURGERY FS (CUSTOM PROCEDURE TRAY) ×3 IMPLANT
PAD CAST 3X4 CTTN HI CHSV (CAST SUPPLIES) ×1 IMPLANT
PAD CAST 4YDX4 CTTN HI CHSV (CAST SUPPLIES) IMPLANT
PADDING CAST ABS 4INX4YD NS (CAST SUPPLIES) ×2
PADDING CAST ABS COTTON 4X4 ST (CAST SUPPLIES) ×1 IMPLANT
PADDING CAST COTTON 3X4 STRL (CAST SUPPLIES) ×2
PADDING CAST COTTON 4X4 STRL (CAST SUPPLIES)
SLEEVE SCD COMPRESS KNEE MED (MISCELLANEOUS) IMPLANT
SLING ARM FOAM STRAP MED (SOFTGOODS) ×3 IMPLANT
SPEAR EYE SURG WECK-CEL (MISCELLANEOUS) ×3 IMPLANT
SPLINT PLASTER CAST XFAST 3X15 (CAST SUPPLIES) IMPLANT
SPLINT PLASTER XTRA FASTSET 3X (CAST SUPPLIES)
STOCKINETTE 4X48 STRL (DRAPES) ×3 IMPLANT
SUT ETHIBOND 3-0 V-5 (SUTURE) IMPLANT
SUT ETHILON 4 0 PS 2 18 (SUTURE) ×3 IMPLANT
SUT FIBERWIRE 4-0 18 TAPR NDL (SUTURE)
SUT MERSILENE 4 0 P 3 (SUTURE) ×3 IMPLANT
SUT NYLON 9 0 VRM6 (SUTURE) ×3 IMPLANT
SUT PROLENE 6 0 P 1 18 (SUTURE) ×3 IMPLANT
SUT SILK 4 0 PS 2 (SUTURE) ×3 IMPLANT
SUT SUPRAMID 4-0 (SUTURE) ×6 IMPLANT
SUT VICRYL 4-0 PS2 18IN ABS (SUTURE) IMPLANT
SUTURE FIBERWR 4-0 18 TAPR NDL (SUTURE) IMPLANT
SYR BULB EAR ULCER 3OZ GRN STR (SYRINGE) ×3 IMPLANT
SYR CONTROL 10ML LL (SYRINGE) IMPLANT
TOWEL GREEN STERILE FF (TOWEL DISPOSABLE) ×6 IMPLANT
TRAY DSU PREP LF (CUSTOM PROCEDURE TRAY) IMPLANT
UNDERPAD 30X36 HEAVY ABSORB (UNDERPADS AND DIAPERS) ×3 IMPLANT

## 2020-08-10 NOTE — Anesthesia Preprocedure Evaluation (Signed)
Anesthesia Evaluation  Patient identified by MRN, date of birth, ID band Patient awake  General Assessment Comment:States GM was slow to awaken - She hasn't ever had issues  Reviewed: Allergy & Precautions, NPO status , Patient's Chart, lab work & pertinent test results  History of Anesthesia Complications (+) Family history of anesthesia reaction  Airway Mallampati: II  TM Distance: >3 FB Neck ROM: Full    Dental no notable dental hx. (+) Teeth Intact   Pulmonary asthma , Current Smoker and Patient abstained from smoking.,    Pulmonary exam normal breath sounds clear to auscultation       Cardiovascular Exercise Tolerance: Good hypertension, Pt. on medications negative cardio ROS Normal cardiovascular examI Rhythm:Regular Rate:Normal     Neuro/Psych  Headaches, Anxiety Depression Bipolar Disorder  Neuromuscular disease negative psych ROS   GI/Hepatic Neg liver ROS, hiatal hernia, GERD  Medicated and Controlled,  Endo/Other  Morbid obesity  Renal/GU negative Renal ROS  negative genitourinary   Musculoskeletal  (+) Arthritis , Osteoarthritis,    Abdominal (+) + obese,   Peds negative pediatric ROS (+)  Hematology negative hematology ROS (+)   Anesthesia Other Findings   Reproductive/Obstetrics negative OB ROS                             Anesthesia Physical  Anesthesia Plan  ASA: III  Anesthesia Plan: General   Post-op Pain Management:    Induction: Intravenous  PONV Risk Score and Plan: 2 and Ondansetron, Treatment may vary due to age or medical condition and Midazolam  Airway Management Planned: LMA  Additional Equipment:   Intra-op Plan:   Post-operative Plan: Extubation in OR  Informed Consent: I have reviewed the patients History and Physical, chart, labs and discussed the procedure including the risks, benefits and alternatives for the proposed anesthesia with the  patient or authorized representative who has indicated his/her understanding and acceptance.     Dental advisory given  Plan Discussed with: CRNA  Anesthesia Plan Comments:         Anesthesia Quick Evaluation

## 2020-08-10 NOTE — Op Note (Signed)
I assisted Surgeon(s) and Role:    * Betha Loa, MD - Primary    Cindee Salt, MD on the Procedure(s): LEFT SMALL FINGER EXPLORATION, REPAIR OF TENDON, ARTERY, NERVE on 08/10/2020.  I provided assistance on this case as follows:setup, exploration and repair both flexor tendons and microscopic repair of digital artery and nerve, closure of the wound and application of the dressings and splint.  Electronically signed by: Cindee Salt, MD Date: 08/10/2020 Time: 5:08 PM

## 2020-08-10 NOTE — Anesthesia Procedure Notes (Signed)
Procedure Name: LMA Insertion Date/Time: 08/10/2020 3:42 PM Performed by: Burna Cash, CRNA Pre-anesthesia Checklist: Patient identified, Emergency Drugs available, Suction available and Patient being monitored Patient Re-evaluated:Patient Re-evaluated prior to induction Oxygen Delivery Method: Circle system utilized Preoxygenation: Pre-oxygenation with 100% oxygen Induction Type: IV induction Ventilation: Mask ventilation without difficulty LMA: LMA inserted LMA Size: 4.0 Number of attempts: 1 Airway Equipment and Method: Bite block Placement Confirmation: positive ETCO2 Tube secured with: Tape Dental Injury: Teeth and Oropharynx as per pre-operative assessment

## 2020-08-10 NOTE — Discharge Instructions (Addendum)

## 2020-08-10 NOTE — Op Note (Addendum)
NAME: Destiny Petty MEDICAL RECORD NO: [id number] DATE OF BIRTH: [birth year] FACILITY: [facility] LOCATION: [facility] PHYSICIAN: Tami Ribas, MD   OPERATIVE REPORT   DATE OF PROCEDURE: 08/10/20    PREOPERATIVE DIAGNOSIS:   Right small finger laceration   POSTOPERATIVE DIAGNOSIS:   Right small finger FDP and FDS lacerations, ulnar digital nerve and artery laceration   PROCEDURE:   1.  Right small finger repair FDP zone 2 laceration 2.  Right small finger repair of FDS zone 2 laceration 3.  Right small finger repair of ulnar digital nerve under microscope 4.  Right small finger repair of ulnar digital artery under microscope   SURGEON:  Betha Loa, M.D.   ASSISTANT: Cindee Salt, MD   ANESTHESIA:  General   INTRAVENOUS FLUIDS:  Per anesthesia flow sheet.   ESTIMATED BLOOD LOSS:  Minimal.   COMPLICATIONS:  None.   SPECIMENS:  none   TOURNIQUET TIME:    Total Tourniquet Time Documented: Upper Arm (Left) - 71 minutes Total: Upper Arm (Left) - 71 minutes    DISPOSITION:  Stable to PACU.   INDICATIONS: 39 year old female states she sustained a laceration to her right small finger approximate 1 week ago.  She was seen at the emergency department where the wound was cleaned and sutured.  She is unable to flex the finger and has decreased sensation.  She wishes to undergo operative exploration with repair of tendon artery nerve is necessary. Risks, benefits and alternatives of surgery were discussed including the risks of blood loss, infection, damage to nerves, vessels, tendons, ligaments, bone for surgery, need for additional surgery, complications with wound healing, continued pain, stiffness.  She voiced understanding of these risks and elected to proceed.  OPERATIVE COURSE:  After being identified preoperatively by myself,  the patient and I agreed on the procedure and site of the procedure.  The surgical site was marked.  Surgical consent had been  signed. She was given IV antibiotics as preoperative antibiotic prophylaxis. She was transferred to the operating room and placed on the operating table in supine position with the Left upper extremity on an arm board.  General anesthesia was induced by the anesthesiologist.  Left upper extremity was prepped and draped in normal sterile orthopedic fashion.  A surgical pause was performed between the surgeons, anesthesia, and operating room staff and all were in agreement as to the patient, procedure, and site of procedure.  Tourniquet at the proximal aspect of the extremity was inflated to 250 mmHg after exsanguination of the arm with an Esmarch bandage.    The wound is explored.  There was hematoma within.  The sheath was violated.  The wound was extended proximally and distally in a [place] fashion.  The radial digital nerve and artery were identified and were intact.  The ulnar digital nerve and artery were identified.  The artery was 100% lacerated.  The nerve was 75% or more lacerated.  The portion of the sheath proximal to the A4 pulley was released for visualization.  The FDS and FDP tendon stumps were able to be retrieved from proximally.  The FDS tendon was repaired with a 4-0 Mersilene suture in a figure-of-eight fashion.  This provided good reapposition.  The FDP tendon was then repaired initially with a running epitendinous 6-0 Prolene suture along the dorsal aspect.  A modified Kessler technique was used to pass a 4 oh looped Supramid core suture.  This was then augmented with a 4-0 Mersilene core suture  as well.  The volar aspect of the epitendinous suture was then placed.  Good reapproximation and contour was obtained.  The finger was able to be ranged in the tendon would slide under the sheath.  The microscope was brought in.  This was used for microdissection of the ulnar digital nerve and artery.  The ulnar digital artery was repaired with a 9-0 nylon suture in a interrupted circumferential fashion.   Good reapproximation was obtained.  The 9-0 nylon suture was then used to reapproximate the nerve ends.  Again a interrupted circumferential fashion was used.  Good reapproximation was obtained.  The wound was copiously irrigated with sterile saline.  It was closed with 4-0 nylon in a horizontal mattress fashion.  A digital block was performed with quarter percent plain Marcaine to aid in postoperative analgesia.  The wound was dressed with sterile Xeroform 4 x 4's and wrapped with a Kerlix bandage.  Dorsal blocking splint is placed with the wrist at 30 to 40 degrees flexion the MPs flexed and the IP is extended.  This was wrapped with Kerlix and Ace bandage.  The tourniquet was deflated at 71 minutes.  Fingertips were pink with brisk capillary refill after deflation of tourniquet.  The operative  drapes were broken down.  The patient was awoken from anesthesia safely.  She was transferred back to the stretcher and taken to PACU in stable condition.  I will see her back in the office in 1 week for postoperative followup.  I will give her a prescription for Norco 5/325 1-2 tabs PO q6 hours prn pain, dispense # 25.   Betha Loa, MD Electronically signed, 08/10/20

## 2020-08-10 NOTE — Anesthesia Postprocedure Evaluation (Signed)
Anesthesia Post Note  Patient: Destiny Petty  Procedure(s) Performed: LEFT SMALL FINER EXPLORATION, REPAIR OF TENDON, ARTERY, NERVE (Left Little Finger)     Patient location during evaluation: PACU Anesthesia Type: General Level of consciousness: awake and alert Pain management: pain level controlled Vital Signs Assessment: post-procedure vital signs reviewed and stable Respiratory status: spontaneous breathing, nonlabored ventilation and respiratory function stable Cardiovascular status: blood pressure returned to baseline and stable Postop Assessment: no apparent nausea or vomiting Anesthetic complications: no   No complications documented.  Last Vitals:  Vitals:   08/10/20 1715 08/10/20 1730  BP: (!) 153/82 (!) 131/99  Pulse: 83 70  Resp: 19 (!) 8  Temp:    SpO2: 99% 97%    Last Pain:  Vitals:   08/10/20 1715  TempSrc:   PainSc: 5                  Lowella Curb

## 2020-08-10 NOTE — H&P (Signed)
Destiny Petty is an 39 y.o. female.   Chief Complaint: right small finger laceration HPI: 39 yo female states she sustained laceration right small finger while cutting chicken 1 week ago.  Seen at [facility] where wound cleaned and sutured.  Followed up in office.  She wishes to proceed with operative exploration and repair tendon/artery/nerve as necessary.  Allergies:  Allergies  Allergen Reactions  . Bee Venom Shortness Of Breath  . Latex Itching  . Lisinopril Cough    coughing    Past Medical History:  Diagnosis Date  . Allergy    SEASONAL  . 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 Anxiety   . Arthritis 12/04/2016   Phreesia 05/23/2020  . Asthma   . Back pain with right-sided sciatica 06/18/2016  . Bipolar disorder (HCC) 01/15/2016   Overview:  Tried Depakote  . Body mass index 36.0-36.9, adult 04/10/2016  . Body mass index 37.0-37.9, adult 03/13/2016  . Depression   . Depressive disorder, not elsewhere classified 03/27/2007  . Dysmenorrhea 04/10/2016  . Family history of adverse reaction to anesthesia    PONV  . GERD (gastroesophageal reflux disease)   . Hemorrhoids 02/10/2015  . Hiatal hernia   . Hyperlipidemia 04/09/2011  . Hypertension    no meds  . Laceration of finger of left hand without damage to nail    left small finger  . Menorrhagia with regular cycle 04/10/2016  . Migraine 01/15/2016   Overview:  Overview:  IMO Load 2016 R1.3  . Muscle spasm of back 03/13/2016  . Numbness and tingling in left hand 08/19/2016  . Obesity   . Ovarian cyst 12/18/2016  . Perianal wart 02/10/2015  . Stress 02/14/2016  . Weight loss counseling, encounter for 03/13/2016    Past Surgical History:  Procedure Laterality Date  . ABLATION    . CESAREAN SECTION    . CESAREAN SECTION N/A    Phreesia 05/23/2020  . CHOLECYSTECTOMY    . COLONOSCOPY    . DILATATION AND CURETTAGE/HYSTEROSCOPY WITH MINERVA N/A 08/31/2019   Procedure: DILATATION AND CURETTAGE (no specimen) /HYSTEROSCOPY WITH MINERVA ENDOMETRIAL  ABLATION;  Surgeon: Tilda Burrow, MD;  Location: [facility];  Service: Gynecology;  Laterality: N/A;  . ESOPHAGOGASTRODUODENOSCOPY ENDOSCOPY    . TUBAL LIGATION      Family History: Family History  Problem Relation Age of Onset  . Diabetes Mother   . Kidney failure Mother   . Early death Mother 49       diabetes complications  . Cancer Father 70       colon cancer  . Depression Sister   . Hypertension Sister   . Anxiety disorder Sister   . Other Sister        back problems  . ADD / ADHD Daughter   . Other Daughter        behavioral issues  . ODD Daughter   . Asthma Son   . Cancer Maternal Grandmother        breast  . Seizures Maternal Grandmother   . Lupus Other   . Breast cancer Maternal Aunt     Social History:   reports that she has been smoking cigarettes. She has a 20.00 pack-year smoking history. She has never used smokeless tobacco. She reports current alcohol use. She reports that she does not use drugs.  Medications: Medications Prior to Admission  Medication Sig Dispense Refill  . acetaminophen (TYLENOL) 500 MG tablet Take 1,000 mg by mouth every 6 (six) hours as needed for  mild pain, moderate pain or headache.    . albuterol (VENTOLIN HFA) 108 (90 Base) MCG/ACT inhaler Inhale 1-2 puffs into the lungs every 6 (six) hours as needed for wheezing or shortness of breath. 18 g 1  . buPROPion (WELLBUTRIN XL) 300 MG 24 hr tablet Take 1 tablet (300 mg total) by mouth daily. 90 tablet 0  . fluticasone (FLONASE) 50 MCG/ACT nasal spray Place 2 sprays into both nostrils daily. 16 g 2  . HYDROcodone-acetaminophen (NORCO/VICODIN) 5-325 MG tablet Take 1 tablet by mouth every 4 (four) hours as needed for moderate pain. 10 tablet 0  . omeprazole (PRILOSEC) 40 MG capsule Take 40 mg by mouth daily.    [name] sulfamethoxazole-trimethoprim (BACTRIM DS) 800-160 MG tablet Take 1 tablet by mouth 2 (two) times daily for 10 days. 20 tablet 0    Results for orders placed or performed during  the hospital encounter of 08/09/20 (from the past 48 hour(s))  SARS CORONAVIRUS 2 (TAT 6-24 HRS) Nasopharyngeal Nasopharyngeal Swab     Status: None   Collection Time: 08/09/20  1:41 PM   Specimen: Nasopharyngeal Swab  Result Value Ref Range   SARS Coronavirus 2 NEGATIVE NEGATIVE    Comment: (NOTE) SARS-CoV-2 target nucleic acids are NOT DETECTED.  The SARS-CoV-2 RNA is generally detectable in upper and lower respiratory specimens during the acute phase of infection. Negative results do not preclude SARS-CoV-2 infection, do not rule out co-infections with other pathogens, and should not be used as the sole basis for treatment or other patient management decisions. Negative results must be combined with clinical observations, patient history, and epidemiological information. The expected result is Negative.  Fact Sheet for Patients: HairSlick.no  Fact Sheet for Healthcare Providers: quierodirigir.com  This test is not yet approved or cleared by the [place] FDA and  has been authorized for detection and/or diagnosis of SARS-CoV-2 by FDA under an Emergency Use Authorization (EUA). This EUA will remain  in effect (meaning this test can be used) for the duration of the COVID-19 declaration under Se ction 564(b)(1) of the Act, 21 U.S.C. section 360bbb-3(b)(1), unless the authorization is terminated or revoked sooner.  Performed at [facility], [address], [place], [place] [address]     No results found.   A comprehensive review of systems was negative.  Blood pressure 124/86, pulse 78, temperature (!) 97.4 F (36.3 C), temperature source Oral, resp. rate 20, height 5' (1.524 m), weight 94.3 kg, SpO2 97 %.  General appearance: alert, cooperative and appears stated age Head: Normocephalic, without obvious abnormality, atraumatic Neck: supple, symmetrical, trachea midline Cardio: regular rate and  rhythm Resp: clear to auscultation bilaterally Extremities: Intact sensation and capillary refill all digits except right small finger.  +epl/fpl/io.  Unable to flex right small finger.  Decreased sensation in fingertip. Pulses: 2+ and symmetric Skin: Skin color, texture, turgor normal. No rashes or lesions Neurologic: Grossly normal Incision/Wound: laceration volar right small finger.  Assessment/Plan Right small finger laceration.  Plan operative exploration with repair tendon/artery/nerve as necessary.  Non operative and operative treatment options have been discussed with the patient and patient wishes to proceed with operative treatment. Risks, benefits, and alternatives of surgery have been discussed and the patient agrees with the plan of care.   Betha Loa 08/10/2020, 1:26 PM

## 2020-08-10 NOTE — Transfer of Care (Signed)
Immediate Anesthesia Transfer of Care Note  Patient: Destiny Petty  Procedure(s) Performed: LEFT SMALL FINER EXPLORATION, REPAIR OF TENDON, ARTERY, NERVE (Left Little Finger)  Patient Location: PACU  Anesthesia Type:General  Level of Consciousness: sedated  Airway & Oxygen Therapy: Patient Spontanous Breathing and Patient connected to face mask oxygen  Post-op Assessment: Report given to RN and Post -op Vital signs reviewed and stable  Post vital signs: Reviewed and stable  Last Vitals:  Vitals Value Taken Time  BP 153/82 08/10/20 1715  Temp 36.6 C 08/10/20 1712  Pulse 84 08/10/20 1716  Resp 19 08/10/20 1716  SpO2 99 % 08/10/20 1716  Vitals shown include unvalidated device data.  Last Pain:  Vitals:   08/10/20 1712  TempSrc:   PainSc: Asleep         Complications: No complications documented.

## 2020-08-11 ENCOUNTER — Encounter (HOSPITAL_BASED_OUTPATIENT_CLINIC_OR_DEPARTMENT_OTHER): Payer: Self-pay | Admitting: Orthopedic Surgery

## 2020-08-23 ENCOUNTER — Other Ambulatory Visit: Payer: Self-pay

## 2020-08-23 ENCOUNTER — Ambulatory Visit (HOSPITAL_COMMUNITY)
Admission: RE | Admit: 2020-08-23 | Discharge: 2020-08-23 | Disposition: A | Discharge status: Home / Self Care | Payer: Medicaid Other | Source: Ambulatory Visit | Attending: Orthopaedic Surgery | Admitting: Orthopaedic Surgery

## 2020-08-23 DIAGNOSIS — G8929 Other chronic pain: Secondary | ICD-10-CM | POA: Diagnosis not present

## 2020-08-23 DIAGNOSIS — S66126A Laceration of flexor muscle, fascia and tendon of right little finger at wrist and hand level, initial encounter: Secondary | ICD-10-CM | POA: Insufficient documentation

## 2020-08-23 DIAGNOSIS — S65516A Laceration of blood vessel of right little finger, initial encounter: Secondary | ICD-10-CM | POA: Insufficient documentation

## 2020-08-23 DIAGNOSIS — M25562 Pain in left knee: Secondary | ICD-10-CM | POA: Insufficient documentation

## 2020-08-23 DIAGNOSIS — S64496A Injury of digital nerve of right little finger, initial encounter: Secondary | ICD-10-CM

## 2020-08-23 HISTORY — DX: Laceration of blood vessel of right little finger, initial encounter: S65.516A

## 2020-08-23 HISTORY — DX: Injury of digital nerve of right little finger, initial encounter: S64.496A

## 2020-08-29 ENCOUNTER — Ambulatory Visit (INDEPENDENT_AMBULATORY_CARE_PROVIDER_SITE_OTHER): Payer: Medicaid Other | Admitting: Orthopaedic Surgery

## 2020-08-29 ENCOUNTER — Encounter: Payer: Self-pay | Admitting: Orthopaedic Surgery

## 2020-08-29 ENCOUNTER — Other Ambulatory Visit: Payer: Self-pay

## 2020-08-29 VITALS — Wt 205.0 lb

## 2020-08-29 DIAGNOSIS — F17210 Nicotine dependence, cigarettes, uncomplicated: Secondary | ICD-10-CM | POA: Diagnosis not present

## 2020-08-29 DIAGNOSIS — G8929 Other chronic pain: Secondary | ICD-10-CM

## 2020-08-29 DIAGNOSIS — M25562 Pain in left knee: Secondary | ICD-10-CM

## 2020-08-29 DIAGNOSIS — Z6841 Body Mass Index (BMI) 40.0 and over, adult: Secondary | ICD-10-CM | POA: Diagnosis not present

## 2020-08-29 MED ORDER — HYDROCODONE-ACETAMINOPHEN 5-325 MG PO TABS: ORAL_TABLET | ORAL | 0 refills | Status: DC

## 2020-08-29 NOTE — Progress Notes (Signed)
Patient Destiny Petty, female DOB:1981/05/17, 39 y.o. EXH:[id number]  Chief Complaint  Patient presents with  . Knee Pain    left knee MRI     HPI  Destiny Petty is a 39 y.o. female who has left knee pain.  She has swelling and popping and giving way.  She had MRI which showed: IMPRESSION: 1. Intact ligamentous structures and no acute bony findings. 2. No meniscal tears. 3. Mild tricompartmental degenerative chondrosis. 4. Small joint effusion and small to moderate-sized Baker's cyst.  I have explained the findings to her.  She does not need surgery.   Body mass index is 40.04 kg/m.  The patient meets the AMA guidelines for Morbid (severe) obesity with a BMI > 40.0 and I have recommended weight loss.   ROS  Review of Systems  Constitutional: Positive for activity change.  Musculoskeletal: Positive for arthralgias, back pain, gait problem, joint swelling and myalgias.  Allergic/Immunologic: Positive for environmental allergies.  Psychiatric/Behavioral: Positive for self-injury.  All other systems reviewed and are negative.   All other systems reviewed and are negative.  The following is a summary of the past history medically, past history surgically, known current medicines, social history and family history.  This information is gathered electronically by the computer from prior information and documentation.  I review this each visit and have found including this information at this point in the chart is beneficial and informative.    Past Medical History:  Diagnosis Date  . Allergy    SEASONAL  . 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 Anxiety   . Arthritis 12/04/2016   Phreesia 05/23/2020  . Asthma   . Back pain with right-sided sciatica 06/18/2016  . Bipolar disorder (HCC) 01/15/2016   Overview:  Tried Depakote  . Body mass index 36.0-36.9, adult 04/10/2016  . Body mass index 37.0-37.9, adult 03/13/2016  . Depression   . Depressive disorder, not  elsewhere classified 03/27/2007  . Dysmenorrhea 04/10/2016  . Family history of adverse reaction to anesthesia    PONV  . GERD (gastroesophageal reflux disease)   . Hemorrhoids 02/10/2015  . Hiatal hernia   . Hyperlipidemia 04/09/2011  . Hypertension    no meds  . Laceration of finger of left hand without damage to nail    left small finger  . Menorrhagia with regular cycle 04/10/2016  . Migraine 01/15/2016   Overview:  Overview:  IMO Load 2016 R1.3  . Muscle spasm of back 03/13/2016  . Numbness and tingling in left hand 08/19/2016  . Obesity   . Ovarian cyst 12/18/2016  . Perianal wart 02/10/2015  . Stress 02/14/2016  . Weight loss counseling, encounter for 03/13/2016    Past Surgical History:  Procedure Laterality Date  . ABLATION    . CESAREAN SECTION    . CESAREAN SECTION N/A    Phreesia 05/23/2020  . CHOLECYSTECTOMY    . COLONOSCOPY    . DILATATION AND CURETTAGE/HYSTEROSCOPY WITH MINERVA N/A 08/31/2019   Procedure: DILATATION AND CURETTAGE (no specimen) /HYSTEROSCOPY WITH MINERVA ENDOMETRIAL ABLATION;  Surgeon: Tilda Burrow, MD;  Location: [facility];  Service: Gynecology;  Laterality: N/A;  . ESOPHAGOGASTRODUODENOSCOPY ENDOSCOPY    . TENDON EXPLORATION Left 08/10/2020   Procedure: LEFT SMALL FINER EXPLORATION, REPAIR OF TENDON, ARTERY, NERVE;  Surgeon: Betha Loa, MD;  Location: [facility];  Service: Orthopedics;  Laterality: Left;  . TUBAL LIGATION      Family History  Problem Relation Age of Onset  . Diabetes Mother   .  Kidney failure Mother   . Early death Mother 17       diabetes complications  . Cancer Father 60       colon cancer  . Depression Sister   . Hypertension Sister   . Anxiety disorder Sister   . Other Sister        back problems  . ADD / ADHD Daughter   . Other Daughter        behavioral issues  . ODD Daughter   . Asthma Son   . Cancer Maternal Grandmother        breast  . Seizures Maternal Grandmother   . Lupus Other   . Breast cancer  Maternal Aunt     Social History Social History   Tobacco Use  . Smoking status: Current Every Day Smoker    Packs/day: 1.00    Years: 20.00    Pack years: 20.00    Types: Cigarettes  . Smokeless tobacco: Never Used  . Tobacco comment: trying to quit  Vaping Use  . Vaping Use: Never used  Substance Use Topics  . Alcohol use: Yes    Comment: occ  . Drug use: No    Allergies  Allergen Reactions  . Bee Venom Shortness Of Breath  . Latex Itching  . Lisinopril Cough    coughing    Current Outpatient Medications  Medication Sig Dispense Refill  . albuterol (VENTOLIN HFA) 108 (90 Base) MCG/ACT inhaler Inhale 1-2 puffs into the lungs every 6 (six) hours as needed for wheezing or shortness of breath. 18 g 1  . buPROPion (WELLBUTRIN XL) 300 MG 24 hr tablet Take 1 tablet (300 mg total) by mouth daily. 90 tablet 0  . fluticasone (FLONASE) 50 MCG/ACT nasal spray Place 2 sprays into both nostrils daily. 16 g 2  . omeprazole (PRILOSEC) 40 MG capsule Take 40 mg by mouth daily.    [name] HYDROcodone-acetaminophen (NORCO/VICODIN) 5-325 MG tablet One tablet every four hours for pain. 30 tablet 0   No current facility-administered medications for this visit.     Physical Exam  Weight 205 lb (93 kg).  Constitutional: overall normal hygiene, normal nutrition, well developed, normal grooming, normal body habitus. Assistive device:none  Musculoskeletal: gait and station Limp left, muscle tone and strength are normal, no tremors or atrophy is present.  .  Neurological: coordination overall normal.  Deep tendon reflex/nerve stretch intact.  Sensation normal.  Cranial nerves II-XII intact.   Skin:   Normal overall no scars, lesions, ulcers or rashes. No psoriasis.  Psychiatric: Alert and oriented x 3.  Recent memory intact, remote memory unclear.  Normal mood and affect. Well groomed.  Good eye contact.  Cardiovascular: overall no swelling, no varicosities, no edema bilaterally, normal  temperatures of the legs and arms, no clubbing, cyanosis and good capillary refill.  Lymphatic: palpation is normal.  Left knee has pain, effusion, ROM 0 to 110, limp left, NV intact.  Stable.  All other systems reviewed and are negative   The patient has been educated about the nature of the problem(s) and counseled on treatment options.  The patient appeared to understand what I have discussed and is in agreement with it.  Encounter Diagnoses  Name Primary?  . Chronic pain of left knee Yes  . Nicotine dependence, cigarettes, uncomplicated   . Body mass index 40.0-44.9, adult (HCC)   . Morbid obesity (HCC)     PLAN Call if any problems.  Precautions discussed.  Continue current medications.  Return to clinic 1 month   I have reviewed the [facility] Controlled Substance Reporting System web site prior to prescribing narcotic medicine for this patient.   Electronically Signed Darreld Mclean, MD 9/21/20212:25 PM

## 2020-08-29 NOTE — Patient Instructions (Signed)
The MRI did not show anything to require surgery, you do have some arthritis, it may bother you more if it gets cold or damp weather. Use Aspercreme, Biofreeze or Voltaren gel over the counter 2-3 times daily make sure you rub it in well each time you use it.    Steps to Quit Smoking Smoking tobacco is the leading cause of preventable death. It can affect almost every organ in the body. Smoking puts you and people around you at risk for many serious, long-lasting (chronic) diseases. Quitting smoking can be hard, but it is one of the best things that you can do for your health. It is never too late to quit. How do I get ready to quit? When you decide to quit smoking, make a plan to help you succeed. Before you quit:  Pick a date to quit. Set a date within the next 2 weeks to give you time to prepare.  Write down the reasons why you are quitting. Keep this list in places where you will see it often.  Tell your family, friends, and co-workers that you are quitting. Their support is important.  Talk with your doctor about the choices that may help you quit.  Find out if your health insurance will pay for these treatments.  Know the people, places, things, and activities that make you want to smoke (triggers). Avoid them. What first steps can I take to quit smoking?  Throw away all cigarettes at home, at work, and in your car.  Throw away the things that you use when you smoke, such as ashtrays and lighters.  Clean your car. Make sure to empty the ashtray.  Clean your home, including curtains and carpets. What can I do to help me quit smoking? Talk with your doctor about taking medicines and seeing a counselor at the same time. You are more likely to succeed when you do both.  If you are pregnant or breastfeeding, talk with your doctor about counseling or other ways to quit smoking. Do not take medicine to help you quit smoking unless your doctor tells you to do so. To quit smoking: Quit  right away  Quit smoking totally, instead of slowly cutting back on how much you smoke over a period of time.  Go to counseling. You are more likely to quit if you go to counseling sessions regularly. Take medicine You may take medicines to help you quit. Some medicines need a prescription, and some you can buy over-the-counter. Some medicines may contain a drug called nicotine to replace the nicotine in cigarettes. Medicines may:  Help you to stop having the desire to smoke (cravings).  Help to stop the problems that come when you stop smoking (withdrawal symptoms). Your doctor may ask you to use:  Nicotine patches, gum, or lozenges.  Nicotine inhalers or sprays.  Non-nicotine medicine that is taken by mouth. Find resources Find resources and other ways to help you quit smoking and remain smoke-free after you quit. These resources are most helpful when you use them often. They include:  Online chats with a Veterinary surgeon.  Phone quitlines.  Printed Materials engineer.  Support groups or group counseling.  Text messaging programs.  Mobile phone apps. Use apps on your mobile phone or tablet that can help you stick to your quit plan. There are many free apps for mobile phones and tablets as well as websites. Examples include Quit Guide from the [employer] and smokefree.gov  What things can I do to make it  easier to quit?   Talk to your family and friends. Ask them to support and encourage you.  Call a phone quitline (1-800-QUIT-NOW), reach out to support groups, or work with a Veterinary surgeon.  Ask people who smoke to not smoke around you.  Avoid places that make you want to smoke, such as: ? Bars. ? Parties. ? Smoke-break areas at work.  Spend time with people who do not smoke.  Lower the stress in your life. Stress can make you want to smoke. Try these things to help your stress: ? Getting regular exercise. ? Doing deep-breathing exercises. ? Doing yoga. ? Meditating. ? Doing a  body scan. To do this, close your eyes, focus on one area of your body at a time from head to toe. Notice which parts of your body are tense. Try to relax the muscles in those areas. How will I feel when I quit smoking? Day 1 to 3 weeks Within the first 24 hours, you may start to have some problems that come from quitting tobacco. These problems are very bad 2-3 days after you quit, but they do not often last for more than 2-3 weeks. You may get these symptoms:  Mood swings.  Feeling restless, nervous, angry, or annoyed.  Trouble concentrating.  Dizziness.  Strong desire for high-sugar foods and nicotine.  Weight gain.  Trouble pooping (constipation).  Feeling like you may vomit (nausea).  Coughing or a sore throat.  Changes in how the medicines that you take for other issues work in your body.  Depression.  Trouble sleeping (insomnia). Week 3 and afterward After the first 2-3 weeks of quitting, you may start to notice more positive results, such as:  Better sense of smell and taste.  Less coughing and sore throat.  Slower heart rate.  Lower blood pressure.  Clearer skin.  Better breathing.  Fewer sick days. Quitting smoking can be hard. Do not give up if you fail the first time. Some people need to try a few times before they succeed. Do your best to stick to your quit plan, and talk with your doctor if you have any questions or concerns. Summary  Smoking tobacco is the leading cause of preventable death. Quitting smoking can be hard, but it is one of the best things that you can do for your health.  When you decide to quit smoking, make a plan to help you succeed.  Quit smoking right away, not slowly over a period of time.  When you start quitting, seek help from your doctor, family, or friends. This information is not intended to replace advice given to you by your health care provider. Make sure you discuss any questions you have with your health care  provider. Document Revised: 08/20/2019 Document Reviewed: 02/13/2019 Elsevier Patient Education  2020 [employer].

## 2020-08-30 DIAGNOSIS — M25641 Stiffness of right hand, not elsewhere classified: Secondary | ICD-10-CM | POA: Diagnosis not present

## 2020-08-30 DIAGNOSIS — M79644 Pain in right finger(s): Secondary | ICD-10-CM | POA: Diagnosis not present

## 2020-08-30 DIAGNOSIS — S66126A Laceration of flexor muscle, fascia and tendon of right little finger at wrist and hand level, initial encounter: Secondary | ICD-10-CM | POA: Diagnosis not present

## 2020-09-06 DIAGNOSIS — S66126A Laceration of flexor muscle, fascia and tendon of right little finger at wrist and hand level, initial encounter: Secondary | ICD-10-CM | POA: Diagnosis not present

## 2020-09-06 DIAGNOSIS — M79644 Pain in right finger(s): Secondary | ICD-10-CM | POA: Diagnosis not present

## 2020-09-06 DIAGNOSIS — M25641 Stiffness of right hand, not elsewhere classified: Secondary | ICD-10-CM | POA: Diagnosis not present

## 2020-09-13 DIAGNOSIS — S66126A Laceration of flexor muscle, fascia and tendon of right little finger at wrist and hand level, initial encounter: Secondary | ICD-10-CM | POA: Diagnosis not present

## 2020-09-13 DIAGNOSIS — M25641 Stiffness of right hand, not elsewhere classified: Secondary | ICD-10-CM | POA: Diagnosis not present

## 2020-09-20 DIAGNOSIS — S66126A Laceration of flexor muscle, fascia and tendon of right little finger at wrist and hand level, initial encounter: Secondary | ICD-10-CM | POA: Diagnosis not present

## 2020-09-20 DIAGNOSIS — M25641 Stiffness of right hand, not elsewhere classified: Secondary | ICD-10-CM | POA: Diagnosis not present

## 2020-09-20 DIAGNOSIS — S64496D Injury of digital nerve of right little finger, subsequent encounter: Secondary | ICD-10-CM | POA: Diagnosis not present

## 2020-09-20 DIAGNOSIS — M79644 Pain in right finger(s): Secondary | ICD-10-CM | POA: Diagnosis not present

## 2020-09-20 DIAGNOSIS — S65516D Laceration of blood vessel of right little finger, subsequent encounter: Secondary | ICD-10-CM | POA: Diagnosis not present

## 2020-09-26 ENCOUNTER — Ambulatory Visit (INDEPENDENT_AMBULATORY_CARE_PROVIDER_SITE_OTHER): Payer: Medicaid Other | Admitting: Orthopaedic Surgery

## 2020-09-26 ENCOUNTER — Other Ambulatory Visit: Payer: Self-pay

## 2020-09-26 ENCOUNTER — Encounter: Payer: Self-pay | Admitting: Orthopaedic Surgery

## 2020-09-26 VITALS — BP 159/90 | HR 95 | Ht 60.0 in | Wt 204.0 lb

## 2020-09-26 DIAGNOSIS — F17210 Nicotine dependence, cigarettes, uncomplicated: Secondary | ICD-10-CM

## 2020-09-26 DIAGNOSIS — Z6841 Body Mass Index (BMI) 40.0 and over, adult: Secondary | ICD-10-CM

## 2020-09-26 DIAGNOSIS — G8929 Other chronic pain: Secondary | ICD-10-CM

## 2020-09-26 DIAGNOSIS — M25562 Pain in left knee: Secondary | ICD-10-CM

## 2020-09-26 MED ORDER — HYDROCODONE-ACETAMINOPHEN 5-325 MG PO TABS: ORAL_TABLET | ORAL | 0 refills | Status: DC

## 2020-09-26 NOTE — Progress Notes (Signed)
Patient WL:[id number] Destiny Petty, female DOB:Nov 15, 1981, 39 y.o. JHE:[id number]  Chief Complaint  Patient presents with  . Knee Pain    left    HPI  Destiny Petty is a 39 y.o. female who is having more pain in the left knee.  Her MRI was negative.  She has swelling and popping.  She has no new trauma.  She is taking her pain medicine.     Body mass index is 39.84 kg/m.  ROS  Review of Systems  Constitutional: Positive for activity change.  Musculoskeletal: Positive for arthralgias, back pain, gait problem, joint swelling and myalgias.  Allergic/Immunologic: Positive for environmental allergies.  Psychiatric/Behavioral: Positive for self-injury.  All other systems reviewed and are negative.   All other systems reviewed and are negative.  The following is a summary of the past history medically, past history surgically, known current medicines, social history and family history.  This information is gathered electronically by the computer from prior information and documentation.  I review this each visit and have found including this information at this point in the chart is beneficial and informative.    Past Medical History:  Diagnosis Date  . Allergy    SEASONAL  . 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 Anxiety   . Arthritis 12/04/2016   Phreesia 05/23/2020  . Asthma   . Back pain with right-sided sciatica 06/18/2016  . Bipolar disorder (HCC) 01/15/2016   Overview:  Tried Depakote  . Body mass index 36.0-36.9, adult 04/10/2016  . Body mass index 37.0-37.9, adult 03/13/2016  . Depression   . Depressive disorder, not elsewhere classified 03/27/2007  . Dysmenorrhea 04/10/2016  . Family history of adverse reaction to anesthesia    PONV  . GERD (gastroesophageal reflux disease)   . Hemorrhoids 02/10/2015  . Hiatal hernia   . Hyperlipidemia 04/09/2011  . Hypertension    no meds  . Laceration of finger of left hand without damage to nail    left small finger  . Menorrhagia with  regular cycle 04/10/2016  . Migraine 01/15/2016   Overview:  Overview:  IMO Load 2016 R1.3  . Muscle spasm of back 03/13/2016  . Numbness and tingling in left hand 08/19/2016  . Obesity   . Ovarian cyst 12/18/2016  . Perianal wart 02/10/2015  . Stress 02/14/2016  . Weight loss counseling, encounter for 03/13/2016    Past Surgical History:  Procedure Laterality Date  . ABLATION    . CESAREAN SECTION    . CESAREAN SECTION N/A    Phreesia 05/23/2020  . CHOLECYSTECTOMY    . COLONOSCOPY    . DILATATION AND CURETTAGE/HYSTEROSCOPY WITH MINERVA N/A 08/31/2019   Procedure: DILATATION AND CURETTAGE (no specimen) /HYSTEROSCOPY WITH MINERVA ENDOMETRIAL ABLATION;  Surgeon: Tilda Burrow, MD;  Location: [facility];  Service: Gynecology;  Laterality: N/A;  . ESOPHAGOGASTRODUODENOSCOPY ENDOSCOPY    . TENDON EXPLORATION Left 08/10/2020   Procedure: LEFT SMALL FINER EXPLORATION, REPAIR OF TENDON, ARTERY, NERVE;  Surgeon: Betha Loa, MD;  Location: [facility];  Service: Orthopedics;  Laterality: Left;  . TUBAL LIGATION      Family History  Problem Relation Age of Onset  . Diabetes Mother   . Kidney failure Mother   . Early death Mother 22       diabetes complications  . Cancer Father 66       colon cancer  . Depression Sister   . Hypertension Sister   . Anxiety disorder Sister   . Other Sister  back problems  . ADD / ADHD Daughter   . Other Daughter        behavioral issues  . ODD Daughter   . Asthma Son   . Cancer Maternal Grandmother        breast  . Seizures Maternal Grandmother   . Lupus Other   . Breast cancer Maternal Aunt     Social History Social History   Tobacco Use  . Smoking status: Current Every Day Smoker    Packs/day: 1.00    Years: 20.00    Pack years: 20.00    Types: Cigarettes  . Smokeless tobacco: Never Used  . Tobacco comment: trying to quit  Vaping Use  . Vaping Use: Never used  Substance Use Topics  . Alcohol use: Yes    Comment: occ  .  Drug use: No    Allergies  Allergen Reactions  . Bee Venom Shortness Of Breath  . Latex Itching  . Lisinopril Cough    coughing    Current Outpatient Medications  Medication Sig Dispense Refill  . albuterol (VENTOLIN HFA) 108 (90 Base) MCG/ACT inhaler Inhale 1-2 puffs into the lungs every 6 (six) hours as needed for wheezing or shortness of breath. 18 g 1  . buPROPion (WELLBUTRIN XL) 300 MG 24 hr tablet Take 1 tablet (300 mg total) by mouth daily. 90 tablet 0  . fluticasone (FLONASE) 50 MCG/ACT nasal spray Place 2 sprays into both nostrils daily. 16 g 2  . omeprazole (PRILOSEC) 40 MG capsule Take 40 mg by mouth daily.    [name] HYDROcodone-acetaminophen (NORCO/VICODIN) 5-325 MG tablet One tablet every six hours for pain.  Limit 7 days. 28 tablet 0   No current facility-administered medications for this visit.     Physical Exam  Blood pressure (!) 159/90, pulse 95, height 5' (1.524 m), weight 204 lb (92.5 kg).  Constitutional: overall normal hygiene, normal nutrition, well developed, normal grooming, normal body habitus. Assistive device:none  Musculoskeletal: gait and station Limp left, muscle tone and strength are normal, no tremors or atrophy is present.  .  Neurological: coordination overall normal.  Deep tendon reflex/nerve stretch intact.  Sensation normal.  Cranial nerves II-XII intact.   Skin:   Normal overall no scars, lesions, ulcers or rashes. No psoriasis.  Psychiatric: Alert and oriented x 3.  Recent memory intact, remote memory unclear.  Normal mood and affect. Well groomed.  Good eye contact.  Cardiovascular: overall no swelling, no varicosities, no edema bilaterally, normal temperatures of the legs and arms, no clubbing, cyanosis and good capillary refill.  Lymphatic: palpation is normal.  Left knee has pain, effusion, crepitus, ROM 0 to 105, stable.  NV intact. No distal edema.  All other systems reviewed and are negative   The patient has been educated about  the nature of the problem(s) and counseled on treatment options.  The patient appeared to understand what I have discussed and is in agreement with it.  Encounter Diagnoses  Name Primary?  . Chronic pain of left knee Yes  . Nicotine dependence, cigarettes, uncomplicated   . Body mass index 40.0-44.9, adult (HCC)   . Morbid obesity (HCC)     PLAN Call if any problems.  Precautions discussed.  Continue current medications.   Return to clinic 6 weeks   I have reviewed the [facility] Controlled Substance Reporting System web site prior to prescribing narcotic medicine for this patient.   Electronically Signed Darreld Mclean, MD 10/19/20212:46 PM

## 2020-09-27 DIAGNOSIS — M25641 Stiffness of right hand, not elsewhere classified: Secondary | ICD-10-CM | POA: Diagnosis not present

## 2020-09-27 DIAGNOSIS — S66126A Laceration of flexor muscle, fascia and tendon of right little finger at wrist and hand level, initial encounter: Secondary | ICD-10-CM | POA: Diagnosis not present

## 2020-09-27 DIAGNOSIS — S64496D Injury of digital nerve of right little finger, subsequent encounter: Secondary | ICD-10-CM | POA: Diagnosis not present

## 2020-09-27 DIAGNOSIS — S65516D Laceration of blood vessel of right little finger, subsequent encounter: Secondary | ICD-10-CM | POA: Diagnosis not present

## 2020-09-28 ENCOUNTER — Telehealth: Payer: Self-pay | Admitting: Family Medicine

## 2020-09-28 ENCOUNTER — Other Ambulatory Visit: Payer: Self-pay | Admitting: *Deleted

## 2020-09-28 ENCOUNTER — Ambulatory Visit: Payer: Medicaid Other | Admitting: Family Medicine

## 2020-09-28 DIAGNOSIS — J3089 Other allergic rhinitis: Secondary | ICD-10-CM

## 2020-09-28 DIAGNOSIS — J4530 Mild persistent asthma, uncomplicated: Secondary | ICD-10-CM

## 2020-09-28 MED ORDER — ALBUTEROL SULFATE HFA 108 (90 BASE) MCG/ACT IN AERS: 1.0000 | INHALATION_SPRAY | Freq: Four times a day (QID) | RESPIRATORY_TRACT | 1 refills | Status: DC | PRN

## 2020-09-28 MED ORDER — FLUTICASONE PROPIONATE 50 MCG/ACT NA SUSP: 2.0000 | Freq: Every day | NASAL | 2 refills | Status: DC

## 2020-09-28 NOTE — Telephone Encounter (Signed)
Patient needing a refill on her inhaler and flonase sent to [facility] p# [phone]

## 2020-09-28 NOTE — Telephone Encounter (Signed)
Refills sent to pt pharmacy. 

## 2020-10-03 ENCOUNTER — Other Ambulatory Visit: Payer: Self-pay

## 2020-10-03 ENCOUNTER — Encounter: Payer: Self-pay | Admitting: Family Medicine

## 2020-10-03 ENCOUNTER — Ambulatory Visit (INDEPENDENT_AMBULATORY_CARE_PROVIDER_SITE_OTHER): Payer: Medicaid Other | Admitting: Family Medicine

## 2020-10-03 VITALS — BP 126/78 | HR 92 | Ht 60.0 in | Wt 203.0 lb

## 2020-10-03 DIAGNOSIS — Z6841 Body Mass Index (BMI) 40.0 and over, adult: Secondary | ICD-10-CM

## 2020-10-03 MED ORDER — TOPIRAMATE 50 MG PO TABS: ORAL_TABLET | ORAL | 0 refills | Status: DC

## 2020-10-03 NOTE — Patient Instructions (Signed)
HAPPY FALL!  I appreciate the opportunity to provide you with care for your health and wellness. Today we discussed: weight loss needs   Follow up: 4 weeks for wt and BP check  No labs or referrals today  Stop Wellbutrin and start Topamax for weigh loss, please take as directed  Try to eat a more consistent diet and increase water intake  Please continue to practice social distancing to keep you, your family, and our community safe.  If you must go out, please wear a mask and practice good handwashing.  It was a pleasure to see you and I look forward to continuing to work together on your health and well-being. Please do not hesitate to call the office if you need care or have questions about your care.  Have a wonderful day and week. With Gratitude, Tereasa Coop, DNP, AGNP-BC    Calorie Counting for Weight Loss Calories are units of energy. Your body needs a certain amount of calories from food to keep you going throughout the day. When you eat more calories than your body needs, your body stores the extra calories as fat. When you eat fewer calories than your body needs, your body burns fat to get the energy it needs. Calorie counting means keeping track of how many calories you eat and drink each day. Calorie counting can be helpful if you need to lose weight. If you make sure to eat fewer calories than your body needs, you should lose weight. Ask your health care provider what a healthy weight is for you. For calorie counting to work, you will need to eat the right number of calories in a day in order to lose a healthy amount of weight per week. A dietitian can help you determine how many calories you need in a day and will give you suggestions on how to reach your calorie goal.  A healthy amount of weight to lose per week is usually 1-2 lb (0.5-0.9 kg). This usually means that your daily calorie intake should be reduced by 500-750 calories.  Eating 1,200 - 1,500 calories per  day can help most women lose weight.  Eating 1,500 - 1,800 calories per day can help most men lose weight.  What do I need to know about calorie counting? In order to meet your daily calorie goal, you will need to:  Find out how many calories are in each food you would like to eat. Try to do this before you eat.  Decide how much of the food you plan to eat.  Write down what you ate and how many calories it had. Doing this is called keeping a food log. To successfully lose weight, it is important to balance calorie counting with a healthy lifestyle that includes regular activity. Aim for 150 minutes of moderate exercise (such as walking) or 75 minutes of vigorous exercise (such as running) each week. Where do I find calorie information?  The number of calories in a food can be found on a Nutrition Facts label. If a food does not have a Nutrition Facts label, try to look up the calories online or ask your dietitian for help. Remember that calories are listed per serving. If you choose to have more than one serving of a food, you will have to multiply the calories per serving by the amount of servings you plan to eat. For example, the label on a package of bread might say that a serving size is 1 slice and that there  are 90 calories in a serving. If you eat 1 slice, you will have eaten 90 calories. If you eat 2 slices, you will have eaten 180 calories. How do I keep a food log? Immediately after each meal, record the following information in your food log:  What you ate. Don't forget to include toppings, sauces, and other extras on the food.  How much you ate. This can be measured in cups, ounces, or number of items.  How many calories each food and drink had.  The total number of calories in the meal. Keep your food log near you, such as in a small notebook in your pocket, or use a mobile app or website. Some programs will calculate calories for you and show you how many calories you have  left for the day to meet your goal. What are some calorie counting tips?   Use your calories on foods and drinks that will fill you up and not leave you hungry: ? Some examples of foods that fill you up are nuts and nut butters, vegetables, lean proteins, and high-fiber foods like whole grains. High-fiber foods are foods with more than 5 g fiber per serving. ? Drinks such as sodas, specialty coffee drinks, alcohol, and juices have a lot of calories, yet do not fill you up.  Eat nutritious foods and avoid empty calories. Empty calories are calories you get from foods or beverages that do not have many vitamins or protein, such as candy, sweets, and soda. It is better to have a nutritious high-calorie food (such as an avocado) than a food with few nutrients (such as a bag of chips).  Know how many calories are in the foods you eat most often. This will help you calculate calorie counts faster.  Pay attention to calories in drinks. Low-calorie drinks include water and unsweetened drinks.  Pay attention to nutrition labels for "low fat" or "fat free" foods. These foods sometimes have the same amount of calories or more calories than the full fat versions. They also often have added sugar, starch, or salt, to make up for flavor that was removed with the fat.  Find a way of tracking calories that works for you. Get creative. Try different apps or programs if writing down calories does not work for you. What are some portion control tips?  Know how many calories are in a serving. This will help you know how many servings of a certain food you can have.  Use a measuring cup to measure serving sizes. You could also try weighing out portions on a kitchen scale. With time, you will be able to estimate serving sizes for some foods.  Take some time to put servings of different foods on your favorite plates, bowls, and cups so you know what a serving looks like.  Try not to eat straight from a bag or box.  Doing this can lead to overeating. Put the amount you would like to eat in a cup or on a plate to make sure you are eating the right portion.  Use smaller plates, glasses, and bowls to prevent overeating.  Try not to multitask (for example, watch TV or use your computer) while eating. If it is time to eat, sit down at a table and enjoy your food. This will help you to know when you are full. It will also help you to be aware of what you are eating and how much you are eating. What are tips for following this plan?  Reading food labels  Check the calorie count compared to the serving size. The serving size may be smaller than what you are used to eating.  Check the source of the calories. Make sure the food you are eating is high in vitamins and protein and low in saturated and trans fats. Shopping  Read nutrition labels while you shop. This will help you make healthy decisions before you decide to purchase your food.  Make a grocery list and stick to it. Cooking  Try to cook your favorite foods in a healthier way. For example, try baking instead of frying.  Use low-fat dairy products. Meal planning  Use more fruits and vegetables. Half of your plate should be fruits and vegetables.  Include lean proteins like poultry and fish. How do I count calories when eating out?  Ask for smaller portion sizes.  Consider sharing an entree and sides instead of getting your own entree.  If you get your own entree, eat only half. Ask for a box at the beginning of your meal and put the rest of your entree in it so you are not tempted to eat it.  If calories are listed on the menu, choose the lower calorie options.  Choose dishes that include vegetables, fruits, whole grains, low-fat dairy products, and lean protein.  Choose items that are boiled, broiled, grilled, or steamed. Stay away from items that are buttered, battered, fried, or served with cream sauce. Items labeled "crispy" are usually  fried, unless stated otherwise.  Choose water, low-fat milk, unsweetened iced tea, or other drinks without added sugar. If you want an alcoholic beverage, choose a lower calorie option such as a glass of wine or light beer.  Ask for dressings, sauces, and syrups on the side. These are usually high in calories, so you should limit the amount you eat.  If you want a salad, choose a garden salad and ask for grilled meats. Avoid extra toppings like bacon, cheese, or fried items. Ask for the dressing on the side, or ask for olive oil and vinegar or lemon to use as dressing.  Estimate how many servings of a food you are given. For example, a serving of cooked rice is  cup or about the size of half a baseball. Knowing serving sizes will help you be aware of how much food you are eating at restaurants. The list below tells you how big or small some common portion sizes are based on everyday objects: ? 1 oz--4 stacked dice. ? 3 oz--1 deck of cards. ? 1 tsp--1 die. ? 1 Tbsp-- a ping-pong ball. ? 2 Tbsp--1 ping-pong ball. ?  cup-- baseball. ? 1 cup--1 baseball. Summary  Calorie counting means keeping track of how many calories you eat and drink each day. If you eat fewer calories than your body needs, you should lose weight.  A healthy amount of weight to lose per week is usually 1-2 lb (0.5-0.9 kg). This usually means reducing your daily calorie intake by 500-750 calories.  The number of calories in a food can be found on a Nutrition Facts label. If a food does not have a Nutrition Facts label, try to look up the calories online or ask your dietitian for help.  Use your calories on foods and drinks that will fill you up, and not on foods and drinks that will leave you hungry.  Use smaller plates, glasses, and bowls to prevent overeating. This information is not intended to replace advice given to you  by your health care provider. Make sure you discuss any questions you have with your health care  provider. Document Revised: 08/14/2018 Document Reviewed: 10/25/2016 Elsevier Patient Education  2020 [employer].

## 2020-10-03 NOTE — Progress Notes (Signed)
Subjective:  Patient ID: Destiny Petty, female    DOB: [birth year]  Age: 39 y.o. MRN: [id number]  CC:  Chief Complaint  Patient presents with  . Follow-up    discuss medications, wants to change Welbutrin, does not seem to be helping      HPI  HPI   Ms. Destiny Petty is a 39 year old female patient who comes in today for weight check.  We started her on Wellbutrin at her last visit secondary to having failed treatment with phentermine. She did well with this medication for a few months and she now reports she is in in a holding pattern even though she is exercising and more active with work.  She comes in today weighing 203, down from 217.  Overall not feeling much better wanting to lose a little bit more weight in this  She continues to walk daily run on the treadmill and exercise and watch what she eats, but is only eating once daily. She reports drinking water, but might could drink more.   Today patient denies signs and symptoms of COVID 19 infection including fever, chills, cough, shortness of breath, and headache. Past Medical, Surgical, Social History, Allergies, and Medications have been Reviewed.   Past Medical History:  Diagnosis Date  . Allergy    SEASONAL  . 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 Anxiety   . Arthritis 12/04/2016   Phreesia 05/23/2020  . Asthma   . Back pain with right-sided sciatica 06/18/2016  . Bipolar disorder (HCC) 01/15/2016   Overview:  Tried Depakote  . Body mass index 36.0-36.9, adult 04/10/2016  . Body mass index 37.0-37.9, adult 03/13/2016  . Depression   . Depressive disorder, not elsewhere classified 03/27/2007  . Dysmenorrhea 04/10/2016  . Family history of adverse reaction to anesthesia    PONV  . GERD (gastroesophageal reflux disease)   . Hemorrhoids 02/10/2015  . Hiatal hernia   . Hyperlipidemia 04/09/2011  . Hypertension    no meds  . Laceration of finger of left hand without damage to nail    left small finger  . Menorrhagia with regular  cycle 04/10/2016  . Migraine 01/15/2016   Overview:  Overview:  IMO Load 2016 R1.3  . Muscle spasm of back 03/13/2016  . Numbness and tingling in left hand 08/19/2016  . Obesity   . Ovarian cyst 12/18/2016  . Perianal wart 02/10/2015  . Stress 02/14/2016  . Weight loss counseling, encounter for 03/13/2016    Current Meds  Medication Sig  . albuterol (VENTOLIN HFA) 108 (90 Base) MCG/ACT inhaler Inhale 1-2 puffs into the lungs every 6 (six) hours as needed for wheezing or shortness of breath.  [name] buPROPion (WELLBUTRIN XL) 300 MG 24 hr tablet Take 1 tablet (300 mg total) by mouth daily.  . fluticasone (FLONASE) 50 MCG/ACT nasal spray Place 2 sprays into both nostrils daily.  [name] HYDROcodone-acetaminophen (NORCO/VICODIN) 5-325 MG tablet One tablet every six hours for pain.  Limit 7 days.  [name] omeprazole (PRILOSEC) 40 MG capsule Take 40 mg by mouth daily.    ROS:  Review of Systems  Constitutional: Negative.   HENT: Negative.   Eyes: Negative.   Respiratory: Negative.   Cardiovascular: Negative.   Gastrointestinal: Negative.   Genitourinary: Negative.   Musculoskeletal: Negative.   Skin: Negative.   Neurological: Negative.   Endo/Heme/Allergies: Negative.   Psychiatric/Behavioral: Negative.      Objective:   Today's Vitals: BP 126/78 (BP Location: Left Arm, Patient Position: Sitting, Cuff Size: Normal)  Pulse 92   Ht 5' (1.524 m)   Wt 203 lb (92.1 kg)   SpO2 99%   BMI 39.65 kg/m  Vitals with BMI 10/03/2020 09/26/2020 08/29/2020  Height 5\' 0"  5\' 0"  -  Weight 203 lbs 204 lbs 205 lbs  BMI 39.65 39.84 40.04  Systolic 126 159 -  Diastolic 78 90 -  Pulse 92 95 -     Physical Exam Vitals and nursing note reviewed.  Constitutional:      Appearance: Normal appearance. She is well-developed and well-groomed. She is obese.  HENT:     Head: Normocephalic and atraumatic.     Right Ear: External ear normal.     Left Ear: External ear normal.     Mouth/Throat:     Comments: Mask in place    Eyes:     General:        Right eye: No discharge.        Left eye: No discharge.     Conjunctiva/sclera: Conjunctivae normal.  Cardiovascular:     Rate and Rhythm: Normal rate and regular rhythm.     Pulses: Normal pulses.     Heart sounds: Normal heart sounds.  Pulmonary:     Effort: Pulmonary effort is normal.     Breath sounds: Normal breath sounds.  Musculoskeletal:        General: Normal range of motion.     Cervical back: Normal range of motion and neck supple.  Skin:    General: Skin is warm.  Neurological:     General: No focal deficit present.     Mental Status: She is alert and oriented to person, place, and time.  Psychiatric:        Attention and Perception: Attention normal.        Mood and Affect: Mood normal.        Speech: Speech normal.        Behavior: Behavior normal. Behavior is cooperative.        Thought Content: Thought content normal.        Cognition and Memory: Cognition normal.        Judgment: Judgment normal.     Assessment   1. Class 3 severe obesity due to excess calories with body mass index (BMI) of 40.0 to 44.9 in adult, unspecified whether serious comorbidity present (HCC)     Tests ordered No orders of the defined types were placed in this encounter.    Plan: Please see assessment and plan per problem list above.   Meds ordered this encounter  Medications  . topiramate (TOPAMAX) 50 MG tablet    Sig: Take 1 tablet (50 mg total) by mouth daily for 7 days, THEN 2 tablets (100 mg total) daily for 21 days.    Dispense:  52 tablet    Refill:  0    Order Specific Question:   Supervising Provider    Answer:      Patient to follow-up in 10/31/2020  Note: This dictation was prepared with Dragon dictation along with smaller phrase technology. Similar sounding words can be transcribed inadequately or may not be corrected upon review. Any transcriptional errors that result from this process are  unintentional.      Genia Harold, NP

## 2020-10-03 NOTE — Assessment & Plan Note (Addendum)
Success in Wellbutrin is limited and she would like to stop it and try something else. Will try topamax. Adipex did not do much for her in the past also her BP is up and down at each visit.  She realizes she is not eating well due to only eating one meal a day.  She reports staying active   Improved BMI now under 40 Obesity is associated with HLD, HTN, depression   Wt Readings from Last 3 Encounters:  10/03/20 203 lb (92.1 kg)  09/26/20 204 lb (92.5 kg)  08/29/20 205 lb (93 kg)

## 2020-10-16 ENCOUNTER — Other Ambulatory Visit: Payer: Self-pay

## 2020-10-16 ENCOUNTER — Ambulatory Visit
Admission: EM | Admit: 2020-10-16 | Discharge: 2020-10-16 | Disposition: A | Discharge status: Home / Self Care | Payer: Medicaid Other | Attending: Emergency Medicine | Admitting: Emergency Medicine

## 2020-10-16 ENCOUNTER — Encounter: Payer: Self-pay | Admitting: Emergency Medicine

## 2020-10-16 DIAGNOSIS — J019 Acute sinusitis, unspecified: Secondary | ICD-10-CM

## 2020-10-16 DIAGNOSIS — R062 Wheezing: Secondary | ICD-10-CM

## 2020-10-16 MED ORDER — PREDNISONE 20 MG PO TABS: 20.0000 mg | ORAL_TABLET | Freq: Two times a day (BID) | ORAL | 0 refills | Status: AC

## 2020-10-16 MED ORDER — AMOXICILLIN-POT CLAVULANATE 875-125 MG PO TABS: 1.0000 | ORAL_TABLET | Freq: Two times a day (BID) | ORAL | 0 refills | Status: AC

## 2020-10-16 MED ORDER — ALBUTEROL SULFATE HFA 108 (90 BASE) MCG/ACT IN AERS
2.0000 | INHALATION_SPRAY | Freq: Once | RESPIRATORY_TRACT | Status: AC
  Administered 2020-10-16: 2 via RESPIRATORY_TRACT

## 2020-10-16 MED ORDER — DEXAMETHASONE SODIUM PHOSPHATE 10 MG/ML IJ SOLN
10.0000 mg | Freq: Once | INTRAMUSCULAR | Status: AC
  Administered 2020-10-16: 10 mg via INTRAMUSCULAR

## 2020-10-16 NOTE — ED Provider Notes (Signed)
[facility]   [id number] 10/16/20 Arrival Time: 1634   CC: sinus pain/ pressure/ wheezing  SUBJECTIVE: History from: patient.  Destiny Petty is a 39 y.o. female who presents with sinus pain, pressure, and wheezing x 5 days.  Denies sick exposure to COVID, flu or strep.  Has tried OTC medications without relief.  Symptoms are made worse at work.  Reports previous symptoms in the past with sinus infection.  Reports improvement with z-pak and prednisone.   Denies fever, chills, rhinorrhea, sore throat, SOB, chest pain, nausea, changes in bowel or bladder habits.    ROS: As per HPI.  All other pertinent ROS negative.     Past Medical History:  Diagnosis Date  . Allergy    SEASONAL  . 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 Anxiety   . Arthritis 12/04/2016   Phreesia 05/23/2020  . Asthma   . Back pain with right-sided sciatica 06/18/2016  . Bipolar disorder (HCC) 01/15/2016   Overview:  Tried Depakote  . Body mass index 36.0-36.9, adult 04/10/2016  . Body mass index 37.0-37.9, adult 03/13/2016  . Depression   . Depressive disorder, not elsewhere classified 03/27/2007  . Dysmenorrhea 04/10/2016  . Family history of adverse reaction to anesthesia    PONV  . GERD (gastroesophageal reflux disease)   . Hemorrhoids 02/10/2015  . Hiatal hernia   . Hyperlipidemia 04/09/2011  . Hypertension    no meds  . Laceration of finger of left hand without damage to nail    left small finger  . Menorrhagia with regular cycle 04/10/2016  . Migraine 01/15/2016   Overview:  Overview:  IMO Load 2016 R1.3  . Muscle spasm of back 03/13/2016  . Numbness and tingling in left hand 08/19/2016  . Obesity   . Ovarian cyst 12/18/2016  . Perianal wart 02/10/2015  . Stress 02/14/2016  . Weight loss counseling, encounter for 03/13/2016   Past Surgical History:  Procedure Laterality Date  . ABLATION    . CESAREAN SECTION    . CESAREAN SECTION N/A    Phreesia 05/23/2020  . CHOLECYSTECTOMY    . COLONOSCOPY    .  DILATATION AND CURETTAGE/HYSTEROSCOPY WITH MINERVA N/A 08/31/2019   Procedure: DILATATION AND CURETTAGE (no specimen) /HYSTEROSCOPY WITH MINERVA ENDOMETRIAL ABLATION;  Surgeon: Tilda Burrow, MD;  Location: [facility];  Service: Gynecology;  Laterality: N/A;  . ESOPHAGOGASTRODUODENOSCOPY ENDOSCOPY    . TENDON EXPLORATION Left 08/10/2020   Procedure: LEFT SMALL FINER EXPLORATION, REPAIR OF TENDON, ARTERY, NERVE;  Surgeon: Betha Loa, MD;  Location: [facility];  Service: Orthopedics;  Laterality: Left;  . TUBAL LIGATION     Allergies  Allergen Reactions  . Bee Venom Shortness Of Breath  . Latex Itching  . Lisinopril Cough    coughing   No current facility-administered medications on file prior to encounter.   Current Outpatient Medications on File Prior to Encounter  Medication Sig Dispense Refill  . albuterol (VENTOLIN HFA) 108 (90 Base) MCG/ACT inhaler Inhale 1-2 puffs into the lungs every 6 (six) hours as needed for wheezing or shortness of breath. 18 g 1  . buPROPion (WELLBUTRIN XL) 300 MG 24 hr tablet Take 1 tablet (300 mg total) by mouth daily. 90 tablet 0  . fluticasone (FLONASE) 50 MCG/ACT nasal spray Place 2 sprays into both nostrils daily. 16 g 2  . HYDROcodone-acetaminophen (NORCO/VICODIN) 5-325 MG tablet One tablet every six hours for pain.  Limit 7 days. 28 tablet 0  . omeprazole (PRILOSEC) 40 MG  capsule Take 40 mg by mouth daily.    [name] topiramate (TOPAMAX) 50 MG tablet Take 1 tablet (50 mg total) by mouth daily for 7 days, THEN 2 tablets (100 mg total) daily for 21 days. 52 tablet 0   Social History   Socioeconomic History  . Marital status: Legally Separated    Spouse name: Not on file  . Number of children: 2  . Years of education: 18  . Highest education level: Not on file  Occupational History  . Not on file  Tobacco Use  . Smoking status: Current Every Day Smoker    Packs/day: 1.00    Years: 20.00    Pack years: 20.00    Types: Cigarettes  .  Smokeless tobacco: Never Used  . Tobacco comment: trying to quit  Vaping Use  . Vaping Use: Never used  Substance and Sexual Activity  . Alcohol use: Yes    Comment: occ  . Drug use: No  . Sexual activity: Yes    Birth control/protection: Surgical    Comment: tubal  Other Topics Concern  . 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 Difficulty of Paying Living Expenses: Not hard at all  Food Insecurity: No Food Insecurity  . Worried About Programme researcher, broadcasting/film/video in the Last Year: Never true  . Ran Out of Food in the Last Year: Never true  Transportation Needs: No Transportation Needs  . Lack of Transportation (Medical): No  . Lack of Transportation (Non-Medical): No  Physical Activity: Insufficiently Active  . Days of Exercise per Week: 2 days  . Minutes of Exercise per Session: 30 min  Stress: No Stress Concern Present  . Feeling of Stress : Not at all  Social Connections: Moderately Isolated  . Frequency of Communication with Friends and Family: More than three times a week  . Frequency of Social Gatherings with Friends and Family: More than three times a week  . Attends Religious Services: More than 4 times per year  . Active Member of Clubs or Organizations: No  . Attends Banker Meetings: Never  . Marital Status: Separated  Intimate Partner Violence: Not At Risk  . Fear of Current or Ex-Partner: No  . Emotionally Abused: No  . Physically Abused: No  . Sexually Abused: No   Family History  Problem  Relation Age of Onset  . Diabetes Mother   . Kidney failure Mother   . Early death Mother 70       diabetes complications  . Cancer Father 77       colon cancer  . Depression Sister   . Hypertension Sister   . Anxiety disorder Sister   . Other Sister        back problems  . ADD / ADHD Daughter   .  Other Daughter        behavioral issues  . ODD Daughter   . Asthma Son   . Cancer Maternal Grandmother        breast  . Seizures Maternal Grandmother   . Lupus Other   . Breast cancer Maternal Aunt     OBJECTIVE:  Vitals:   10/16/20 1651 10/16/20 1652  BP: (!) 136/92   Pulse: 96   Resp: 18   Temp: 98.5 F (36.9 C)   TempSrc: Oral   SpO2: 97%   Weight:  202 lb 13.2 oz (92 kg)  Height:  5' (1.524 m)     General appearance: alert; appears fatigued, but nontoxic; speaking in full sentences and tolerating own secretions HEENT: NCAT; Ears: EACs clear, TMs pearly gray; Eyes: PERRL.  EOM grossly intact. Sinuses: TTP; Nose: nares patent without rhinorrhea, Throat: oropharynx clear, tonsils non erythematous or enlarged, uvula midline  Neck: supple without LAD Lungs: unlabored respirations, symmetrical air entry; cough: mild; no respiratory distress; diffuse wheezes heard throughout bilateral lung fields Heart: regular rate and rhythm.   Skin: warm and dry Psychological: alert and cooperative; normal mood and affect   ASSESSMENT & PLAN:  1. Acute non-recurrent sinusitis, unspecified location   2. Wheezing     Meds ordered this encounter  Medications  . amoxicillin-clavulanate (AUGMENTIN) 875-125 MG tablet    Sig: Take 1 tablet by mouth every 12 (twelve) hours for 10 days.    Dispense:  20 tablet    Refill:  0    Order Specific Question:   Supervising Provider    Answer:   Eustace Moore [[id number]]  . predniSONE (DELTASONE) 20 MG tablet    Sig: Take 1 tablet (20 mg total) by mouth 2 (two) times daily with a meal for 5 days.    Dispense:  10 tablet    Refill:  0     Order Specific Question:   Supervising Provider    Answer:   Eustace Moore [[id number]]  . dexamethasone (DECADRON) injection 10 mg  . albuterol (VENTOLIN HFA) 108 (90 Base) MCG/ACT inhaler 2 puff    Get plenty of rest and push fluids Augmentin and prednisone prescribed.  Take as directed and to completion Use OTC zyrtec for nasal congestion, runny nose, and/or sore throat Use OTC flonase for nasal congestion and runny nose Use medications daily for symptom relief Use OTC medications like ibuprofen or tylenol as needed fever or pain Call or go to the ED if you have any new or worsening symptoms such as fever, cough, shortness of breath, chest tightness, chest pain, turning blue, changes in mental status, etc...   Reviewed expectations re: course of current medical issues. Questions answered. Outlined signs and symptoms indicating need for more acute intervention. Patient verbalized understanding. After Visit Summary given.   Steroid shot given in office Albuterol inhaler given in office        Rennis Harding, [place] 10/16/20 1704

## 2020-10-16 NOTE — ED Triage Notes (Signed)
Pt states she has a sinus infection , she gets one every year. Having pain, swelling in sinus areas and pressure behind her ears. States she normally takes a z pack and prednisone to get over this.

## 2020-10-16 NOTE — Discharge Instructions (Signed)
Get plenty of rest and push fluids Augmentin and prednisone prescribed.  Take as directed and to completion Use OTC zyrtec for nasal congestion, runny nose, and/or sore throat Use OTC flonase for nasal congestion and runny nose Use medications daily for symptom relief Use OTC medications like ibuprofen or tylenol as needed fever or pain Call or go to the ED if you have any new or worsening symptoms such as fever, cough, shortness of breath, chest tightness, chest pain, turning blue, changes in mental status, etc..[name]

## 2020-10-18 DIAGNOSIS — S66126A Laceration of flexor muscle, fascia and tendon of right little finger at wrist and hand level, initial encounter: Secondary | ICD-10-CM | POA: Diagnosis not present

## 2020-10-18 DIAGNOSIS — S64496D Injury of digital nerve of right little finger, subsequent encounter: Secondary | ICD-10-CM | POA: Diagnosis not present

## 2020-10-18 DIAGNOSIS — M25641 Stiffness of right hand, not elsewhere classified: Secondary | ICD-10-CM | POA: Diagnosis not present

## 2020-10-18 DIAGNOSIS — S65516D Laceration of blood vessel of right little finger, subsequent encounter: Secondary | ICD-10-CM | POA: Diagnosis not present

## 2020-10-18 DIAGNOSIS — M79644 Pain in right finger(s): Secondary | ICD-10-CM | POA: Diagnosis not present

## 2020-10-18 DIAGNOSIS — S64496A Injury of digital nerve of right little finger, initial encounter: Secondary | ICD-10-CM | POA: Diagnosis not present

## 2020-10-20 ENCOUNTER — Emergency Department (HOSPITAL_COMMUNITY)
Admission: EM | Admit: 2020-10-20 | Discharge: 2020-10-21 | Discharge status: Against Medical Advice | Payer: Medicaid Other

## 2020-10-20 ENCOUNTER — Other Ambulatory Visit: Payer: Self-pay

## 2020-10-21 ENCOUNTER — Other Ambulatory Visit: Payer: Self-pay

## 2020-10-21 ENCOUNTER — Emergency Department (HOSPITAL_COMMUNITY)
Admission: EM | Admit: 2020-10-21 | Discharge: 2020-10-21 | Disposition: A | Discharge status: Home / Self Care | Payer: Medicaid Other | Attending: Emergency Medicine | Admitting: Emergency Medicine

## 2020-10-21 DIAGNOSIS — F17210 Nicotine dependence, cigarettes, uncomplicated: Secondary | ICD-10-CM | POA: Diagnosis not present

## 2020-10-21 DIAGNOSIS — J45909 Unspecified asthma, uncomplicated: Secondary | ICD-10-CM | POA: Insufficient documentation

## 2020-10-21 DIAGNOSIS — Z20822 Contact with and (suspected) exposure to covid-19: Secondary | ICD-10-CM | POA: Insufficient documentation

## 2020-10-21 DIAGNOSIS — Z91040 Latex allergy status: Secondary | ICD-10-CM | POA: Diagnosis not present

## 2020-10-21 DIAGNOSIS — J209 Acute bronchitis, unspecified: Secondary | ICD-10-CM | POA: Diagnosis not present

## 2020-10-21 DIAGNOSIS — J069 Acute upper respiratory infection, unspecified: Secondary | ICD-10-CM | POA: Insufficient documentation

## 2020-10-21 DIAGNOSIS — I10 Essential (primary) hypertension: Secondary | ICD-10-CM | POA: Diagnosis not present

## 2020-10-21 DIAGNOSIS — R051 Acute cough: Secondary | ICD-10-CM | POA: Diagnosis present

## 2020-10-21 LAB — RESPIRATORY PANEL BY RT PCR (FLU A&B, COVID)
Influenza A by PCR: NEGATIVE
Influenza B by PCR: NEGATIVE
SARS Coronavirus 2 by RT PCR: NEGATIVE

## 2020-10-21 MED ORDER — BENZONATATE 100 MG PO CAPS: 100.0000 mg | ORAL_CAPSULE | Freq: Three times a day (TID) | ORAL | 0 refills | Status: DC

## 2020-10-21 MED ORDER — AZITHROMYCIN 250 MG PO TABS: 250.0000 mg | ORAL_TABLET | Freq: Every day | ORAL | 0 refills | Status: DC

## 2020-10-21 MED ORDER — AZITHROMYCIN 250 MG PO TABS
500.0000 mg | ORAL_TABLET | Freq: Once | ORAL | Status: AC
  Administered 2020-10-21: 500 mg via ORAL
  Filled 2020-10-21: qty 2

## 2020-10-21 NOTE — ED Provider Notes (Signed)
[facility]-EMERGENCY DEPT Provider Note   CSN: [id number] Arrival date & time: 10/21/20  0058     History Chief Complaint  Patient presents with  . COVID-like symptoms    Destiny Petty is a 39 y.o. female.  Patient is a 39 year old female with history of hypertension, hyperlipidemia, asthma, and bipolar.  She presents today for evaluation of URI symptoms.  She describes cough, congestion, loss of taste and smell, and feeling generally unwell for the past 3 weeks.  She has tried over-the-counter medications with little relief.  Patient was diagnosed in December 2020 with Covid and this feels similar.  She denies any definite Covid exposures.  The history is provided by the patient.       Past Medical History:  Diagnosis Date  . Allergy    SEASONAL  . 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 Anxiety   . Arthritis 12/04/2016   Phreesia 05/23/2020  . Asthma   . Back pain with right-sided sciatica 06/18/2016  . Bipolar disorder (HCC) 01/15/2016   Overview:  Tried Depakote  . Body mass index 36.0-36.9, adult 04/10/2016  . Body mass index 37.0-37.9, adult 03/13/2016  . Depression   . Depressive disorder, not elsewhere classified 03/27/2007  . Dysmenorrhea 04/10/2016  . Family history of adverse reaction to anesthesia    PONV  . GERD (gastroesophageal reflux disease)   . Hemorrhoids 02/10/2015  . Hiatal hernia   . Hyperlipidemia 04/09/2011  . Hypertension    no meds  . Laceration of finger of left hand without damage to nail    left small finger  . Menorrhagia with regular cycle 04/10/2016  . Migraine 01/15/2016   Overview:  Overview:  IMO Load 2016 R1.3  . Muscle spasm of back 03/13/2016  . Numbness and tingling in left hand 08/19/2016  . Obesity   . Ovarian cyst 12/18/2016  . Perianal wart 02/10/2015  . Stress 02/14/2016  . Weight loss counseling, encounter for 03/13/2016    Patient Active Problem List   Diagnosis Date Noted  . Injury of digital nerve of right little  finger 08/23/2020  . Laceration of blood vessel of right little finger 08/23/2020  . Laceration of flexor muscle, fascia and tendon of right little finger at wrist and hand level, initial encounter 08/23/2020  . Environmental and seasonal allergies 07/28/2020  . Arthritis of left knee 06/01/2020  . Screening for diabetes mellitus 06/01/2020  . Visit for screening mammogram 06/01/2020  . 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 Back pain with right-sided sciatica 06/18/2016  . Morbid obesity (HCC) 03/13/2016  . Weight loss counseling, encounter for 03/13/2016  . Gallstones 01/29/2016  . Recurrent major depressive disorder, in full remission (HCC) 01/15/2016  . Nicotine addiction 07/10/2013  . GERD (gastroesophageal reflux disease) 07/10/2013  . HTN (hypertension) 07/08/2013  . Asthma, mild persistent 04/05/2013  . Orthopnea 12/18/2011  . Hyperlipidemia 04/09/2011    Past Surgical History:  Procedure Laterality Date  . ABLATION    . CESAREAN SECTION    . CESAREAN SECTION N/A    Phreesia 05/23/2020  . CHOLECYSTECTOMY    . COLONOSCOPY    . DILATATION AND CURETTAGE/HYSTEROSCOPY WITH MINERVA N/A 08/31/2019   Procedure: DILATATION AND CURETTAGE (no specimen) /HYSTEROSCOPY WITH MINERVA ENDOMETRIAL ABLATION;  Surgeon: Tilda Burrow, MD;  Location: [facility];  Service: Gynecology;  Laterality: N/A;  . ESOPHAGOGASTRODUODENOSCOPY ENDOSCOPY    . TENDON EXPLORATION Left 08/10/2020   Procedure: LEFT SMALL FINER EXPLORATION, REPAIR OF TENDON, ARTERY,  NERVE;  Surgeon: Betha Loa, MD;  Location: [facility];  Service: Orthopedics;  Laterality: Left;  . TUBAL LIGATION       OB History    Gravida  2   Para  2   Term  2   Preterm      AB      Living  2     SAB      TAB      Ectopic      Multiple      Live Births  2           Family History  Problem Relation Age of Onset  . Diabetes Mother   . Kidney failure Mother   . Early death Mother 72        diabetes complications  . Cancer Father 41       colon cancer  . Depression Sister   . Hypertension Sister   . Anxiety disorder Sister   . Other Sister        back problems  . ADD / ADHD Daughter   . Other Daughter        behavioral issues  . ODD Daughter   . Asthma Son   . Cancer Maternal Grandmother        breast  . Seizures Maternal Grandmother   . Lupus Other   . Breast cancer Maternal Aunt     Social History   Tobacco Use  . Smoking status: Current Every Day Smoker    Packs/day: 1.00    Years: 20.00    Pack years: 20.00    Types: Cigarettes  . Smokeless tobacco: Never Used  . Tobacco comment: trying to quit  Vaping Use  . Vaping Use: Never used  Substance Use Topics  . Alcohol use: Yes    Comment: occ  . Drug use: No    Home Medications Prior to Admission medications   Medication Sig Start Date End Date Taking? Authorizing Provider  albuterol (VENTOLIN HFA) 108 (90 Base) MCG/ACT inhaler Inhale 1-2 puffs into the lungs every 6 (six) hours as needed for wheezing or shortness of breath. 09/28/20   Freddy Finner, NP  amoxicillin-clavulanate (AUGMENTIN) 875-125 MG tablet Take 1 tablet by mouth every 12 (twelve) hours for 10 days. 10/16/20 10/26/20  Wurst, Lowanda Foster, PA-C  buPROPion (WELLBUTRIN XL) 300 MG 24 hr tablet Take 1 tablet (300 mg total) by mouth daily. 07/28/20   Freddy Finner, NP  fluticasone (FLONASE) 50 MCG/ACT nasal spray Place 2 sprays into both nostrils daily. 09/28/20   Freddy Finner, NP  HYDROcodone-acetaminophen (NORCO/VICODIN) 5-325 MG tablet One tablet every six hours for pain.  Limit 7 days. 09/26/20   Darreld Mclean, MD  omeprazole (PRILOSEC) 40 MG capsule Take 40 mg by mouth daily.    [provider]  predniSONE (DELTASONE) 20 MG tablet Take 1 tablet (20 mg total) by mouth 2 (two) times daily with a meal for 5 days. 10/16/20 10/21/20  Wurst, Lowanda Foster, PA-C  topiramate (TOPAMAX) 50 MG tablet Take 1 tablet (50 mg total) by mouth daily  for 7 days, THEN 2 tablets (100 mg total) daily for 21 days. 10/03/20 10/31/20  Freddy Finner, NP    Allergies    Bee venom, Latex, and Lisinopril  Review of Systems   Review of Systems  All other systems reviewed and are negative.   Physical Exam Updated Vital Signs BP 131/84 (BP Location: Left Arm)   Pulse 81  Temp 98.5 F (36.9 C) (Oral)   Resp 15   Ht 5' (1.524 m)   Wt 91.6 kg   SpO2 99%   BMI 39.45 kg/m   Physical Exam Vitals and nursing note reviewed.  Constitutional:      General: She is not in acute distress.    Appearance: She is well-developed. She is not diaphoretic.  HENT:     Head: Normocephalic and atraumatic.  Cardiovascular:     Rate and Rhythm: Normal rate and regular rhythm.     Heart sounds: No murmur heard.  No friction rub. No gallop.   Pulmonary:     Effort: Pulmonary effort is normal. No respiratory distress.     Breath sounds: Normal breath sounds. No wheezing.  Abdominal:     General: Bowel sounds are normal. There is no distension.     Palpations: Abdomen is soft.     Tenderness: There is no abdominal tenderness.  Musculoskeletal:        General: Normal range of motion.     Cervical back: Normal range of motion and neck supple.  Skin:    General: Skin is warm and dry.  Neurological:     Mental Status: She is alert and oriented to person, place, and time.     ED Results / Procedures / Treatments   Labs (all labs ordered are listed, but only abnormal results are displayed) Labs Reviewed  RESPIRATORY PANEL BY RT PCR (FLU A&B, COVID)    EKG None  Radiology No results found.  Procedures Procedures (including critical care time)  Medications Ordered in ED Medications  azithromycin (ZITHROMAX) tablet 500 mg (has no administration in time range)    ED Course  I have reviewed the triage vital signs and the nursing notes.  Pertinent labs & imaging results that were available during my care of the patient were reviewed by  me and considered in my medical decision making (see chart for details).    MDM Rules/Calculators/A&P  Patient with URI symptoms as described in the HPI.  Due to the prolonged nature of her symptoms, I feel as though a trial course of antibiotics is appropriate.  Patient will be given Zithromax and Tessalon.  She is to return as needed/follow-up if not improving.  Covid test here negative.  Patient's lungs are clear with oxygen saturations in the upper 90s.  There is no tachycardia and I highly doubt a cardiac or PE etiology.  Final Clinical Impression(s) / ED Diagnoses Final diagnoses:  None    Rx / DC Orders ED Discharge Orders    None       Geoffery Lyons, MD 10/21/20 [phone]

## 2020-10-21 NOTE — Discharge Instructions (Signed)
Begin taking Zithromax as prescribed.  Take Tessalon as prescribed as needed for cough.  Follow-up with primary doctor if not improving in the next week, and return to the ER if you develop severe chest pain, worsening breathing, or other new and concerning symptoms.

## 2020-10-21 NOTE — ED Triage Notes (Signed)
Patient here because she thinks she has COVID (previously had it last December). Patient states being sick on/off for the past three weeks with cough, sore throat, headache, sinus pressure, loss of appetite, loss of smell/loss of taste. Patient is ambulatory without assistance, in NAD, AxOx4.

## 2020-10-26 DIAGNOSIS — M79644 Pain in right finger(s): Secondary | ICD-10-CM | POA: Diagnosis not present

## 2020-10-26 DIAGNOSIS — M25641 Stiffness of right hand, not elsewhere classified: Secondary | ICD-10-CM | POA: Diagnosis not present

## 2020-10-26 DIAGNOSIS — S64496D Injury of digital nerve of right little finger, subsequent encounter: Secondary | ICD-10-CM | POA: Diagnosis not present

## 2020-10-26 DIAGNOSIS — S66126A Laceration of flexor muscle, fascia and tendon of right little finger at wrist and hand level, initial encounter: Secondary | ICD-10-CM | POA: Diagnosis not present

## 2020-10-26 DIAGNOSIS — S64496A Injury of digital nerve of right little finger, initial encounter: Secondary | ICD-10-CM | POA: Diagnosis not present

## 2020-10-26 DIAGNOSIS — S65516D Laceration of blood vessel of right little finger, subsequent encounter: Secondary | ICD-10-CM | POA: Diagnosis not present

## 2020-10-31 ENCOUNTER — Ambulatory Visit (INDEPENDENT_AMBULATORY_CARE_PROVIDER_SITE_OTHER): Payer: Medicaid Other | Admitting: Family Medicine

## 2020-10-31 ENCOUNTER — Other Ambulatory Visit: Payer: Self-pay

## 2020-10-31 ENCOUNTER — Encounter: Payer: Self-pay | Admitting: Family Medicine

## 2020-10-31 VITALS — BP 126/80 | HR 75 | Temp 97.9°F | Ht 60.0 in | Wt 196.0 lb

## 2020-10-31 DIAGNOSIS — Z6838 Body mass index (BMI) 38.0-38.9, adult: Secondary | ICD-10-CM | POA: Diagnosis not present

## 2020-10-31 DIAGNOSIS — R300 Dysuria: Secondary | ICD-10-CM | POA: Diagnosis not present

## 2020-10-31 DIAGNOSIS — E6609 Other obesity due to excess calories: Secondary | ICD-10-CM | POA: Diagnosis not present

## 2020-10-31 LAB — POCT URINALYSIS DIPSTICK
Blood, UA: NEGATIVE
Glucose, UA: NEGATIVE
Ketones, UA: NEGATIVE
Leukocytes, UA: NEGATIVE
Nitrite, UA: NEGATIVE
Protein, UA: POSITIVE — AB
Spec Grav, UA: >=1.03 — AB (ref 1.010–1.025)
Urobilinogen, UA: 0.2 E.U./dL
pH, UA: 5 (ref 5.0–8.0)

## 2020-10-31 MED ORDER — TOPIRAMATE 100 MG PO TABS: 100.0000 mg | ORAL_TABLET | Freq: Every day | ORAL | 0 refills | Status: DC

## 2020-10-31 NOTE — Assessment & Plan Note (Signed)
Dip in office neg for infx encouraged hydration.

## 2020-10-31 NOTE — Progress Notes (Signed)
Subjective:  Patient ID: Destiny Petty, female    DOB: May 05, 1981  Age: 39 y.o. MRN: [id number]  CC:  Chief Complaint  Patient presents with  . Follow-up  . Dysuria    x2 weeks, burning      HPI  HPI  Destiny Petty is a 39 year old female patient who comes in today for weight check. She comes in today weighing 196, down from 217. Reports that she was very sick and was not able to eat much but thinks the Topamax was helpful for her. Overall feeling much better wanting to lose a little bit more weight in this. Would like to continue the medication. She continues to walk daily run on the treadmill and exercise and watch what she eats, but is only eating once daily. She reports drinking water, but might could drink more. She feels burning when urinating. But denies other S&S or UTI.  Today patient denies signs and symptoms of COVID 19 infection including fever, chills, cough, shortness of breath, and headache. Past Medical, Surgical, Social History, Allergies, and Medications have been Reviewed.   Past Medical History:  Diagnosis Date  . Allergy    SEASONAL  . 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 Anxiety   . Arthritis 12/04/2016   Phreesia 05/23/2020  . Asthma   . Back pain with right-sided sciatica 06/18/2016  . Bipolar disorder (HCC) 01/15/2016   Overview:  Tried Depakote  . Body mass index 36.0-36.9, adult 04/10/2016  . Body mass index 37.0-37.9, adult 03/13/2016  . Depression   . Depressive disorder, not elsewhere classified 03/27/2007  . Dysmenorrhea 04/10/2016  . Family history of adverse reaction to anesthesia    PONV  . GERD (gastroesophageal reflux disease)   . Hemorrhoids 02/10/2015  . Hiatal hernia   . Hyperlipidemia 04/09/2011  . Hypertension    no meds  . Laceration of finger of left hand without damage to nail    left small finger  . Menorrhagia with regular cycle 04/10/2016  . Migraine 01/15/2016   Overview:  Overview:  IMO Load 2016 R1.3  . Muscle spasm of back  03/13/2016  . Numbness and tingling in left hand 08/19/2016  . Obesity   . Ovarian cyst 12/18/2016  . Perianal wart 02/10/2015  . Stress 02/14/2016  . Weight loss counseling, encounter for 03/13/2016    Current Meds  Medication Sig  . albuterol (VENTOLIN HFA) 108 (90 Base) MCG/ACT inhaler Inhale 1-2 puffs into the lungs every 6 (six) hours as needed for wheezing or shortness of breath.  [name] buPROPion (WELLBUTRIN XL) 300 MG 24 hr tablet Take 1 tablet (300 mg total) by mouth daily.  . fluticasone (FLONASE) 50 MCG/ACT nasal spray Place 2 sprays into both nostrils daily.  [name] omeprazole (PRILOSEC) 40 MG capsule Take 40 mg by mouth daily.  [name] topiramate (TOPAMAX) 100 MG tablet Take 1 tablet (100 mg total) by mouth daily.  . [DISCONTINUED] azithromycin (ZITHROMAX) 250 MG tablet Take 1 tablet (250 mg total) by mouth daily.  . [DISCONTINUED] benzonatate (TESSALON) 100 MG capsule Take 1 capsule (100 mg total) by mouth every 8 (eight) hours.  . [DISCONTINUED] HYDROcodone-acetaminophen (NORCO/VICODIN) 5-325 MG tablet One tablet every six hours for pain.  Limit 7 days.  . [DISCONTINUED] topiramate (TOPAMAX) 50 MG tablet Take 1 tablet (50 mg total) by mouth daily for 7 days, THEN 2 tablets (100 mg total) daily for 21 days.    ROS:  Review of Systems  Constitutional: Negative.   HENT: Negative.  Eyes: Negative.   Respiratory: Negative.   Cardiovascular: Negative.   Gastrointestinal: Negative.   Genitourinary: Positive for dysuria.  Musculoskeletal: Negative.   Skin: Negative.   Neurological: Negative.   Endo/Heme/Allergies: Negative.   Psychiatric/Behavioral: Negative.      Objective:   Today's Vitals: BP 126/80 (BP Location: Right Arm, Patient Position: Sitting, Cuff Size: Normal)   Pulse 75   Temp 97.9 F (36.6 C) (Temporal)   Ht 5' (1.524 m)   Wt 196 lb (88.9 kg)   SpO2 98%   BMI 38.28 kg/m  Vitals with BMI 10/31/2020 10/21/2020 10/21/2020  Height 5\' 0"  - -  Weight 196 lbs - -  BMI 38.28  - -  Systolic 126 122  Diastolic 80 67 76  Pulse 75 71 92     Physical Exam Vitals and nursing note reviewed.  Constitutional:      Appearance: Normal appearance. She is well-developed and well-groomed. She is obese.  HENT:     Head: Normocephalic and atraumatic.     Right Ear: External ear normal.     Left Ear: External ear normal.     Mouth/Throat:     Comments: Mask in place  Eyes:     General:        Right eye: No discharge.        Left eye: No discharge.     Conjunctiva/sclera: Conjunctivae normal.  Cardiovascular:     Rate and Rhythm: Normal rate and regular rhythm.     Pulses: Normal pulses.     Heart sounds: Normal heart sounds.  Pulmonary:     Effort: Pulmonary effort is normal.     Breath sounds: Normal breath sounds.  Abdominal:     Tenderness: There is no right CVA tenderness or left CVA tenderness.  Musculoskeletal:        General: Normal range of motion.     Cervical back: Normal range of motion and neck supple.  Skin:    General: Skin is warm.  Neurological:     General: No focal deficit present.     Mental Status: She is alert and oriented to person, place, and time.  Psychiatric:        Attention and Perception: Attention normal.        Mood and Affect: Mood normal.        Speech: Speech normal.        Behavior: Behavior normal. Behavior is cooperative.        Thought Content: Thought content normal.        Cognition and Memory: Cognition normal.        Judgment: Judgment normal.          Assessment   1. Class 2 obesity due to excess calories without serious comorbidity with body mass index (BMI) of 38.0 to 38.9 in adult   2. Dysuria     Tests ordered Orders Placed This Encounter  Procedures  . POCT Urinalysis Dipstick     Plan: Please see assessment and plan per problem list above.   Meds ordered this encounter  Medications  . topiramate (TOPAMAX) 100 MG tablet    Sig: Take 1 tablet (100 mg total) by mouth daily.     Dispense:  30 tablet    Refill:  0    Order Specific Question:   Supervising Provider    Answer:   [id number]]    Patient to follow-up in 4 weeks .  Note: This dictation was  prepared with Dragon dictation along with smaller phrase technology. Similar sounding words can be transcribed inadequately or may not be corrected upon review. Any transcriptional errors that result from this process are unintentional.      Freddy Finner, NP

## 2020-10-31 NOTE — Patient Instructions (Signed)
  HAPPY FALL!  I appreciate the opportunity to provide you with care for your health and wellness. Today we discussed: weight   Follow up: 4 weeks for Wt check   No labs or referrals today  Continue Topamax daily  Glad you are feeling better  I hope you have a good Thanksgiving!  Please continue to practice social distancing to keep you, your family, and our community safe.  If you must go out, please wear a mask and practice good handwashing.  It was a pleasure to see you and I look forward to continuing to work together on your health and well-being. Please do not hesitate to call the office if you need care or have questions about your care.  Have a wonderful day and week. With Gratitude, Tereasa Coop, DNP, AGNP-BC

## 2020-10-31 NOTE — Assessment & Plan Note (Signed)
Success with Wellbutrin and Topamax-continue for now F/u 4 weeks for wt check Encouraged to focus on lifestyle and diet changes

## 2020-11-01 DIAGNOSIS — S64496A Injury of digital nerve of right little finger, initial encounter: Secondary | ICD-10-CM | POA: Diagnosis not present

## 2020-11-01 DIAGNOSIS — S66126A Laceration of flexor muscle, fascia and tendon of right little finger at wrist and hand level, initial encounter: Secondary | ICD-10-CM | POA: Diagnosis not present

## 2020-11-01 DIAGNOSIS — M25641 Stiffness of right hand, not elsewhere classified: Secondary | ICD-10-CM | POA: Diagnosis not present

## 2020-11-01 DIAGNOSIS — S64496D Injury of digital nerve of right little finger, subsequent encounter: Secondary | ICD-10-CM | POA: Diagnosis not present

## 2020-11-01 DIAGNOSIS — M79644 Pain in right finger(s): Secondary | ICD-10-CM | POA: Diagnosis not present

## 2020-11-01 DIAGNOSIS — S65516D Laceration of blood vessel of right little finger, subsequent encounter: Secondary | ICD-10-CM | POA: Diagnosis not present

## 2020-11-08 DIAGNOSIS — S64496A Injury of digital nerve of right little finger, initial encounter: Secondary | ICD-10-CM | POA: Diagnosis not present

## 2020-11-08 DIAGNOSIS — S65516D Laceration of blood vessel of right little finger, subsequent encounter: Secondary | ICD-10-CM | POA: Diagnosis not present

## 2020-11-08 DIAGNOSIS — M79644 Pain in right finger(s): Secondary | ICD-10-CM | POA: Diagnosis not present

## 2020-11-08 DIAGNOSIS — M25641 Stiffness of right hand, not elsewhere classified: Secondary | ICD-10-CM | POA: Diagnosis not present

## 2020-11-08 DIAGNOSIS — S64496D Injury of digital nerve of right little finger, subsequent encounter: Secondary | ICD-10-CM | POA: Diagnosis not present

## 2020-11-08 DIAGNOSIS — S66126A Laceration of flexor muscle, fascia and tendon of right little finger at wrist and hand level, initial encounter: Secondary | ICD-10-CM | POA: Diagnosis not present

## 2020-11-09 ENCOUNTER — Encounter: Payer: Self-pay | Admitting: Orthopaedic Surgery

## 2020-11-09 ENCOUNTER — Ambulatory Visit (INDEPENDENT_AMBULATORY_CARE_PROVIDER_SITE_OTHER): Payer: Medicaid Other | Admitting: Orthopaedic Surgery

## 2020-11-09 ENCOUNTER — Other Ambulatory Visit: Payer: Self-pay

## 2020-11-09 VITALS — BP 165/85 | HR 84 | Ht 60.0 in | Wt 200.0 lb

## 2020-11-09 DIAGNOSIS — F17210 Nicotine dependence, cigarettes, uncomplicated: Secondary | ICD-10-CM

## 2020-11-09 DIAGNOSIS — M25562 Pain in left knee: Secondary | ICD-10-CM

## 2020-11-09 DIAGNOSIS — G8929 Other chronic pain: Secondary | ICD-10-CM

## 2020-11-09 MED ORDER — HYDROCODONE-ACETAMINOPHEN 5-325 MG PO TABS
ORAL_TABLET | ORAL | 0 refills | Status: DC
Start: 2020-11-09 — End: 2020-12-19

## 2020-11-09 NOTE — Progress Notes (Signed)
Patient Destiny Petty, female DOB:1981/09/26, 39 y.o. TOI:[id number]  Chief Complaint  Patient presents with  . Knee Pain    left knee pain .  [name] Hand Pain    left pinky finger PT extended for 6 more week.     HPI  Destiny Petty is a 39 y.o. female who has knee pain on the left.  She has good and bad days.  She is being followed in PT for injury to the left little finger.  She has no giving way of the knee.   Body mass index is 39.06 kg/m.  ROS  Review of Systems  Constitutional: Positive for activity change.  Musculoskeletal: Positive for arthralgias, back pain, gait problem, joint swelling and myalgias.  Allergic/Immunologic: Positive for environmental allergies.  Psychiatric/Behavioral: Positive for self-injury.  All other systems reviewed and are negative.   All other systems reviewed and are negative.  The following is a summary of the past history medically, past history surgically, known current medicines, social history and family history.  This information is gathered electronically by the computer from prior information and documentation.  I review this each visit and have found including this information at this point in the chart is beneficial and informative.    Past Medical History:  Diagnosis Date  . Allergy    SEASONAL  . 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 Anxiety   . Arthritis 12/04/2016   Phreesia 05/23/2020  . Asthma   . Back pain with right-sided sciatica 06/18/2016  . Bipolar disorder (HCC) 01/15/2016   Overview:  Tried Depakote  . Body mass index 36.0-36.9, adult 04/10/2016  . Body mass index 37.0-37.9, adult 03/13/2016  . Depression   . Depressive disorder, not elsewhere classified 03/27/2007  . Dysmenorrhea 04/10/2016  . Family history of adverse reaction to anesthesia    PONV  . GERD (gastroesophageal reflux disease)   . Hemorrhoids 02/10/2015  . Hiatal hernia   . Hyperlipidemia 04/09/2011  . Hypertension    no meds  . Laceration of  finger of left hand without damage to nail    left small finger  . Menorrhagia with regular cycle 04/10/2016  . Migraine 01/15/2016   Overview:  Overview:  IMO Load 2016 R1.3  . Muscle spasm of back 03/13/2016  . Numbness and tingling in left hand 08/19/2016  . Obesity   . Ovarian cyst 12/18/2016  . Perianal wart 02/10/2015  . Stress 02/14/2016  . Weight loss counseling, encounter for 03/13/2016    Past Surgical History:  Procedure Laterality Date  . ABLATION    . CESAREAN SECTION    . CESAREAN SECTION N/A    Phreesia 05/23/2020  . CHOLECYSTECTOMY    . COLONOSCOPY    . DILATATION AND CURETTAGE/HYSTEROSCOPY WITH MINERVA N/A 08/31/2019   Procedure: DILATATION AND CURETTAGE (no specimen) /HYSTEROSCOPY WITH MINERVA ENDOMETRIAL ABLATION;  Surgeon: Tilda Burrow, MD;  Location: [facility];  Service: Gynecology;  Laterality: N/A;  . ESOPHAGOGASTRODUODENOSCOPY ENDOSCOPY    . TENDON EXPLORATION Left 08/10/2020   Procedure: LEFT SMALL FINER EXPLORATION, REPAIR OF TENDON, ARTERY, NERVE;  Surgeon: Betha Loa, MD;  Location: [facility];  Service: Orthopedics;  Laterality: Left;  . TUBAL LIGATION      Family History  Problem Relation Age of Onset  . Diabetes Mother   . Kidney failure Mother   . Early death Mother 57       diabetes complications  . Cancer Father 65       colon cancer  .  Depression Sister   . Hypertension Sister   . Anxiety disorder Sister   . Other Sister        back problems  . ADD / ADHD Daughter   . Other Daughter        behavioral issues  . ODD Daughter   . Asthma Son   . Cancer Maternal Grandmother        breast  . Seizures Maternal Grandmother   . Lupus Other   . Breast cancer Maternal Aunt     Social History Social History   Tobacco Use  . Smoking status: Current Every Day Smoker    Packs/day: 1.00    Years: 20.00    Pack years: 20.00    Types: Cigarettes  . Smokeless tobacco: Never Used  . Tobacco comment: trying to quit  Vaping Use  .  Vaping Use: Never used  Substance Use Topics  . Alcohol use: Yes    Comment: occ  . Drug use: No    Allergies  Allergen Reactions  . Bee Venom Shortness Of Breath  . Latex Itching  . Lisinopril Cough    coughing    Current Outpatient Medications  Medication Sig Dispense Refill  . albuterol (VENTOLIN HFA) 108 (90 Base) MCG/ACT inhaler Inhale 1-2 puffs into the lungs every 6 (six) hours as needed for wheezing or shortness of breath. 18 g 1  . buPROPion (WELLBUTRIN XL) 300 MG 24 hr tablet Take 1 tablet (300 mg total) by mouth daily. 90 tablet 0  . fluticasone (FLONASE) 50 MCG/ACT nasal spray Place 2 sprays into both nostrils daily. 16 g 2  . omeprazole (PRILOSEC) 40 MG capsule Take 40 mg by mouth daily.    [name] topiramate (TOPAMAX) 100 MG tablet Take 1 tablet (100 mg total) by mouth daily. 30 tablet 0  . HYDROcodone-acetaminophen (NORCO/VICODIN) 5-325 MG tablet One tablet every six hours for pain.  Limit 7 days. 28 tablet 0   No current facility-administered medications for this visit.     Physical Exam  Blood pressure (!) 165/85, pulse 84, height 5' (1.524 m), weight 200 lb (90.7 kg).  Constitutional: overall normal hygiene, normal nutrition, well developed, normal grooming, normal body habitus. Assistive device:none  Musculoskeletal: gait and station Limp none, muscle tone and strength are normal, no tremors or atrophy is present.  .  Neurological: coordination overall normal.  Deep tendon reflex/nerve stretch intact.  Sensation normal.  Cranial nerves II-XII intact.   Skin:   Normal overall no scars, lesions, ulcers or rashes. No psoriasis.  Psychiatric: Alert and oriented x 3.  Recent memory intact, remote memory unclear.  Normal mood and affect. Well groomed.  Good eye contact.  Cardiovascular: overall no swelling, no varicosities, no edema bilaterally, normal temperatures of the legs and arms, no clubbing, cyanosis and good capillary refill.  Left knee has good ROM and no  effusion, NV intact.  Lymphatic: palpation is normal.  All other systems reviewed and are negative   The patient has been educated about the nature of the problem(s) and counseled on treatment options.  The patient appeared to understand what I have discussed and is in agreement with it.  Encounter Diagnoses  Name Primary?  . Chronic pain of left knee Yes  . Nicotine dependence, cigarettes, uncomplicated     PLAN Call if any problems.  Precautions discussed.  Continue current medications.   Return to clinic 2 months   I have reviewed the [facility] Controlled Substance Reporting System web site  prior to prescribing narcotic medicine for this patient.   Electronically Signed Darreld Mclean, MD 12/2/20213:29 PM

## 2020-11-14 ENCOUNTER — Ambulatory Visit: Payer: Medicaid Other | Admitting: Orthopaedic Surgery

## 2020-11-28 ENCOUNTER — Ambulatory Visit: Payer: Medicaid Other | Admitting: Family Medicine

## 2020-11-30 ENCOUNTER — Ambulatory Visit: Payer: Medicaid Other | Admitting: Family Medicine

## 2020-12-05 ENCOUNTER — Ambulatory Visit: Payer: Medicaid Other | Admitting: Family Medicine

## 2020-12-05 ENCOUNTER — Other Ambulatory Visit: Payer: Self-pay

## 2020-12-05 ENCOUNTER — Encounter: Payer: Self-pay | Admitting: Family Medicine

## 2020-12-05 VITALS — BP 160/76 | HR 78 | Temp 97.6°F | Ht 60.0 in | Wt 202.0 lb

## 2020-12-05 DIAGNOSIS — J4530 Mild persistent asthma, uncomplicated: Secondary | ICD-10-CM | POA: Diagnosis not present

## 2020-12-05 DIAGNOSIS — I10 Essential (primary) hypertension: Secondary | ICD-10-CM

## 2020-12-05 DIAGNOSIS — F17218 Nicotine dependence, cigarettes, with other nicotine-induced disorders: Secondary | ICD-10-CM

## 2020-12-05 DIAGNOSIS — E6609 Other obesity due to excess calories: Secondary | ICD-10-CM | POA: Diagnosis not present

## 2020-12-05 DIAGNOSIS — J3089 Other allergic rhinitis: Secondary | ICD-10-CM | POA: Diagnosis not present

## 2020-12-05 DIAGNOSIS — Z6838 Body mass index (BMI) 38.0-38.9, adult: Secondary | ICD-10-CM

## 2020-12-05 MED ORDER — TOPIRAMATE 100 MG PO TABS: 100.0000 mg | ORAL_TABLET | Freq: Every day | ORAL | 0 refills | Status: DC

## 2020-12-05 MED ORDER — HYDROCHLOROTHIAZIDE 12.5 MG PO TABS: 12.5000 mg | ORAL_TABLET | Freq: Every day | ORAL | 1 refills | Status: DC

## 2020-12-05 MED ORDER — BUPROPION HCL ER (XL) 300 MG PO TB24: 300.0000 mg | ORAL_TABLET | Freq: Every day | ORAL | 0 refills | Status: DC

## 2020-12-05 MED ORDER — ALBUTEROL SULFATE HFA 108 (90 BASE) MCG/ACT IN AERS: 1.0000 | INHALATION_SPRAY | Freq: Four times a day (QID) | RESPIRATORY_TRACT | 1 refills | Status: DC | PRN

## 2020-12-05 NOTE — Patient Instructions (Signed)
I appreciate the opportunity to provide you with care for your health and wellness. Today we discussed: wt, knee pain  Follow up:  2 weeks for BP check in office  No labs or referrals today  Call Ortho about need pain  Take BP medication daily  Continue weight loss medication as ordered for now  Happy New Year!  Please continue to practice social distancing to keep you, your family, and our community safe.  If you must go out, please wear a mask and practice good handwashing.  It was a pleasure to see you and I look forward to continuing to work together on your health and well-being. Please do not hesitate to call the office if you need care or have questions about your care.  Have a wonderful day. With Gratitude, Tereasa Coop, DNP, AGNP-BC

## 2020-12-05 NOTE — Progress Notes (Signed)
Subjective:  Patient ID: Destiny Petty, female    DOB: 04-11-1981  Age: 39 y.o. MRN: [id number]  CC:  Chief Complaint  Patient presents with  . Weight Check    F/u       HPI  HPI  Destiny Petty is a 39 year old female patient who comes in today for weight check. Destiny Petty comes in today malaise 202 pounds.  Previous weight check had demonstrated that Destiny Petty was 200 pounds.  Her starting weight was 217 pounds.  The last few checks Destiny Petty has been up and down at her weight.  At her last visit Destiny Petty reports the Topamax was very helpful for her.  But Destiny Petty was also sick in which Destiny Petty was not sure if that was the Topamax for the sickness that made her not want to eat as much. Overall today Destiny Petty is unsure if the Topamax is of benefit.  Destiny Petty continues to smoke at this time but thinks that Wellbutrin is helpful.  Blood pressure is elevated in the office Destiny Petty is willing to have medication adjustment.  Destiny Petty would like to continue 1 more month to see how Destiny Petty does on the medicine as phentermine was not very good option for her in the past Destiny Petty just never had success after taking it for a while.  Destiny Petty denies any side effects of medications.  No numbness or tingling in extremities.  No headaches.  Denies having any chest pain, palpitations, leg swelling, dizziness or sleep disturbance.  Today patient denies signs and symptoms of COVID 19 infection including fever, chills, cough, shortness of breath, and headache. Past Medical, Surgical, Social History, Allergies, and Medications have been Reviewed.   Past Medical History:  Diagnosis Date  . Allergy    SEASONAL  . 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 Anxiety   . Arthritis 12/04/2016   Phreesia 05/23/2020  . Asthma   . Back pain with right-sided sciatica 06/18/2016  . Bipolar disorder (HCC) 01/15/2016   Overview:  Tried Depakote  . Body mass index 36.0-36.9, adult 04/10/2016  . Body mass index 37.0-37.9, adult 03/13/2016  . Depression   . Depressive disorder, not  elsewhere classified 03/27/2007  . Dysmenorrhea 04/10/2016  . Family history of adverse reaction to anesthesia    PONV  . GERD (gastroesophageal reflux disease)   . Hemorrhoids 02/10/2015  . Hiatal hernia   . Hyperlipidemia 04/09/2011  . Hypertension    no meds  . 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 Laceration of blood vessel of right little finger 08/23/2020  . Laceration of finger of left hand without damage to nail    left small finger  . Menorrhagia with regular cycle 04/10/2016  . Migraine 01/15/2016   Overview:  Overview:  IMO Load 2016 R1.3  . Muscle spasm of back 03/13/2016  . Numbness and tingling in left hand 08/19/2016  . Obesity   . Ovarian cyst 12/18/2016  . Perianal wart 02/10/2015  . Stress 02/14/2016  . Weight loss counseling, encounter for 03/13/2016    Current Meds  Medication Sig  . fluticasone (FLONASE) 50 MCG/ACT nasal spray Place 2 sprays into both nostrils daily.  . hydrochlorothiazide (HYDRODIURIL) 12.5 MG tablet Take 1 tablet (12.5 mg total) by mouth daily.  [name] HYDROcodone-acetaminophen (NORCO/VICODIN) 5-325 MG tablet One tablet every six hours for pain.  Limit 7 days.  [name] omeprazole (PRILOSEC) 40 MG capsule Take 40 mg by mouth daily.  . [DISCONTINUED] albuterol (VENTOLIN HFA) 108 (90 Base) MCG/ACT  inhaler Inhale 1-2 puffs into the lungs every 6 (six) hours as needed for wheezing or shortness of breath.  . [DISCONTINUED] buPROPion (WELLBUTRIN XL) 300 MG 24 hr tablet Take 1 tablet (300 mg total) by mouth daily.  . [DISCONTINUED] topiramate (TOPAMAX) 100 MG tablet Take 1 tablet (100 mg total) by mouth daily.    ROS:  Review of Systems  Constitutional: Negative.   HENT: Negative.   Eyes: Negative.   Respiratory: Negative.   Cardiovascular: Negative.   Gastrointestinal: Negative.   Genitourinary: Negative.   Musculoskeletal: Negative.   Skin: Negative.   Neurological: Negative.   Endo/Heme/Allergies: Negative.   Psychiatric/Behavioral: Negative.       Objective:   Today's Vitals: BP (!) 160/76 (BP Location: Right Arm, Patient Position: Sitting, Cuff Size: Normal)   Pulse 78   Temp 97.6 F (36.4 C) (Temporal)   Ht 5' (1.524 m)   Wt 202 lb (91.6 kg)   SpO2 97%   BMI 39.45 kg/m  Vitals with BMI 12/05/2020 11/09/2020 10/31/2020  Height 5\' 0"  5\' 0"  5\' 0"   Weight 202 lbs 200 lbs 196 lbs  BMI 39.45 39.06 38.28  Systolic 160 165  Diastolic 76 85 80  Pulse 78 84 75     Physical Exam Vitals and nursing note reviewed.  Constitutional:      Appearance: Normal appearance. Destiny Petty is well-developed and well-groomed. Destiny Petty is obese.  HENT:     Head: Normocephalic and atraumatic.     Right Ear: External ear normal.     Left Ear: External ear normal.     Mouth/Throat:     Comments: Mask in place  Eyes:     General:        Right eye: No discharge.        Left eye: No discharge.     Conjunctiva/sclera: Conjunctivae normal.  Cardiovascular:     Rate and Rhythm: Normal rate and regular rhythm.     Pulses: Normal pulses.     Heart sounds: Normal heart sounds.  Pulmonary:     Effort: Pulmonary effort is normal.     Breath sounds: Normal breath sounds.  Musculoskeletal:        General: Normal range of motion.     Cervical back: Normal range of motion and neck supple.  Skin:    General: Skin is warm.  Neurological:     General: No focal deficit present.     Mental Status: Destiny Petty is alert and oriented to person, place, and time.  Psychiatric:        Attention and Perception: Attention normal.        Mood and Affect: Mood normal.        Speech: Speech normal.        Behavior: Behavior normal. Behavior is cooperative.        Thought Content: Thought content normal.        Cognition and Memory: Cognition normal.        Judgment: Judgment normal.     Assessment   1. Class 2 obesity due to excess calories without serious comorbidity with body mass index (BMI) of 38.0 to 38.9 in adult   2. Essential hypertension   3.  Environmental and seasonal allergies   4. Mild persistent asthma without complication   5. Cigarette nicotine dependence with other nicotine-induced disorder     Tests ordered No orders of the defined types were placed in this encounter.    Plan: Please see assessment and plan  per problem list above.   Meds ordered this encounter  Medications  . albuterol (VENTOLIN HFA) 108 (90 Base) MCG/ACT inhaler    Sig: Inhale 1-2 puffs into the lungs every 6 (six) hours as needed for wheezing or shortness of breath.    Dispense:  18 g    Refill:  1    Order Specific Question:   Supervising Provider    Answer:   SIMPSON, MARGARET E [[id number]]  . topiramate (TOPAMAX) 100 MG tablet    Sig: Take 1 tablet (100 mg total) by mouth daily.    Dispense:  30 tablet    Refill:  0    Order Specific Question:   Supervising Provider    Answer:   SIMPSON, MARGARET E [[id number]]  . buPROPion (WELLBUTRIN XL) 300 MG 24 hr tablet    Sig: Take 1 tablet (300 mg total) by mouth daily.    Dispense:  90 tablet    Refill:  0    Order Specific Question:   Supervising Provider    Answer:   SIMPSON, MARGARET E [[id number]]  . hydrochlorothiazide (HYDRODIURIL) 12.5 MG tablet    Sig: Take 1 tablet (12.5 mg total) by mouth daily.    Dispense:  30 tablet    Refill:  1    Order Specific Question:   Supervising Provider    Answer:   Kerri Perches [[id number]]    Patient to follow-up in 12/21/2020 .   Freddy Finner, NP

## 2020-12-08 ENCOUNTER — Encounter: Payer: Self-pay | Admitting: Family Medicine

## 2020-12-08 NOTE — Assessment & Plan Note (Signed)
Asked about quitting: confirms they are currently smokes cigarettes Advise to quit smoking: Educated about QUITTING to reduce the risk of cancer, cardio and cerebrovascular disease. Assess willingness: Unwilling to quit at this time, but is working on cutting back. Assist with counseling and pharmacotherapy: Counseled for 5 minutes and literature provided. Arrange for follow up:  not quitting follow up in 3 months and continue to offer help.   Still smoking around half a pack a day

## 2020-12-08 NOTE — Assessment & Plan Note (Signed)
She has been on the higher end of control over the last several visits and then the last visit she started creeping up.  Advised of DASH diet, smoking cessation, exercise.  In addition to hydrochlorothiazide being added 12.5 mg close follow-up in a few weeks to see how she is tolerating it.

## 2020-12-08 NOTE — Assessment & Plan Note (Signed)
Had success with Wellbutrin and Topamax a month back.  Reports that she thinks it might be because she was sick.  Willing to try 1 more month of this to see if she can lose more weight as she is put on some weight.  Encouraged to focus on lifestyle and diet changes rather than medication use.

## 2020-12-08 NOTE — Assessment & Plan Note (Signed)
Refill of inhaler provided.  Encourage smoking cessation.

## 2020-12-08 NOTE — Assessment & Plan Note (Addendum)
Overall she reports some control recently was sick and that flared up.  Inhaler refill provided today. Smoking cessation is encouraged

## 2020-12-19 ENCOUNTER — Telehealth: Payer: Self-pay | Admitting: Orthopaedic Surgery

## 2020-12-19 MED ORDER — HYDROCODONE-ACETAMINOPHEN 5-325 MG PO TABS: ORAL_TABLET | ORAL | 0 refills | Status: DC

## 2020-12-19 NOTE — Telephone Encounter (Signed)
Patient requests refill on Hydrocodone/Acetaminophen 5-325 mgs.  Qty  28  Sig: One tablet every six hours for pain. Limit 7 days.  Patient uses [facility] 

## 2020-12-21 ENCOUNTER — Ambulatory Visit: Payer: Medicaid Other | Admitting: Internal Medicine

## 2020-12-21 ENCOUNTER — Encounter: Payer: Self-pay | Admitting: Orthopaedic Surgery

## 2020-12-21 ENCOUNTER — Other Ambulatory Visit: Payer: Self-pay

## 2020-12-21 ENCOUNTER — Ambulatory Visit (INDEPENDENT_AMBULATORY_CARE_PROVIDER_SITE_OTHER): Payer: Medicaid Other | Admitting: Orthopaedic Surgery

## 2020-12-21 VITALS — BP 143/99 | HR 96 | Ht 60.0 in | Wt 200.0 lb

## 2020-12-21 DIAGNOSIS — F17210 Nicotine dependence, cigarettes, uncomplicated: Secondary | ICD-10-CM

## 2020-12-21 DIAGNOSIS — G8929 Other chronic pain: Secondary | ICD-10-CM

## 2020-12-21 DIAGNOSIS — M25562 Pain in left knee: Secondary | ICD-10-CM

## 2020-12-21 NOTE — Progress Notes (Signed)
PROCEDURE NOTE:  The patient requests injections of the left knee , verbal consent was obtained.  The left knee was prepped appropriately after time out was performed.   Sterile technique was observed and injection of 1 cc of Depo-Medrol 40 mg with several cc's of plain xylocaine. Anesthesia was provided by ethyl chloride and a 20-gauge needle was used to inject the knee area. The injection was tolerated well.  A band aid dressing was applied.  The patient was advised to apply ice later today and tomorrow to the injection sight as needed.  Return in one month.  Electronically Signed Darreld Mclean, MD 1/13/20228:44 AM

## 2021-01-04 ENCOUNTER — Ambulatory Visit: Payer: Medicaid Other | Admitting: Internal Medicine

## 2021-01-08 ENCOUNTER — Telehealth: Payer: Self-pay | Admitting: Orthopaedic Surgery

## 2021-01-08 NOTE — Telephone Encounter (Signed)
Patient requests refill on Hydrocodone/Acetaminophen 5-325 mgs.  Qty  28  Sig: One tablet every six hours for pain. Limit 7 days.  Patient uses [facility]

## 2021-01-08 NOTE — Telephone Encounter (Signed)
Per [facility] if script is for more than 5 days it will need a prior authorization or the patient will have to pay out of pocket.

## 2021-01-08 NOTE — Telephone Encounter (Signed)
Odd. I have done this monthly since September with no problem, including first of this month.  Check with pharmacy.  Maybe a new Medicaid rule.  Then she will have to get from primary care.  I do not do prior authorizations.

## 2021-01-09 ENCOUNTER — Ambulatory Visit: Payer: Medicaid Other | Admitting: Orthopaedic Surgery

## 2021-01-10 ENCOUNTER — Other Ambulatory Visit: Payer: Self-pay

## 2021-01-10 ENCOUNTER — Encounter: Payer: Self-pay | Admitting: Nurse Practitioner

## 2021-01-10 ENCOUNTER — Ambulatory Visit: Payer: Medicaid Other | Admitting: Nurse Practitioner

## 2021-01-10 ENCOUNTER — Telehealth: Payer: Self-pay

## 2021-01-10 ENCOUNTER — Other Ambulatory Visit: Payer: Self-pay | Admitting: Family Medicine

## 2021-01-10 VITALS — BP 143/84 | HR 87 | Temp 98.3°F | Resp 20 | Ht 60.0 in | Wt 195.0 lb

## 2021-01-10 DIAGNOSIS — E6609 Other obesity due to excess calories: Secondary | ICD-10-CM

## 2021-01-10 DIAGNOSIS — G43909 Migraine, unspecified, not intractable, without status migrainosus: Secondary | ICD-10-CM

## 2021-01-10 DIAGNOSIS — I10 Essential (primary) hypertension: Secondary | ICD-10-CM | POA: Diagnosis not present

## 2021-01-10 DIAGNOSIS — G8929 Other chronic pain: Secondary | ICD-10-CM | POA: Diagnosis not present

## 2021-01-10 DIAGNOSIS — R519 Headache, unspecified: Secondary | ICD-10-CM

## 2021-01-10 MED ORDER — RIZATRIPTAN BENZOATE 5 MG PO TABS: 5.0000 mg | ORAL_TABLET | ORAL | 1 refills | Status: DC | PRN

## 2021-01-10 MED ORDER — KETOROLAC TROMETHAMINE 60 MG/2ML IM SOLN
30.0000 mg | Freq: Once | INTRAMUSCULAR | Status: AC
  Administered 2021-01-10: 30 mg via INTRAMUSCULAR

## 2021-01-10 NOTE — Progress Notes (Addendum)
Acute Office Visit  Subjective:    Patient ID: Destiny Petty, female    DOB: December 02, 1981, 40 y.o.   MRN: [id number]  Chief Complaint  Patient presents with  . Hypertension  . Headache    Migraines x 2 weeks     HPI Patient is in today for migraine.  Her headache has lasted a solid 2 weeks.  She rates her pain at 7/10, and hurts at both temples and across her forehead.  She denies photosensitivity, nausea, or vomiting.  She states that noise makes her more irritable.  She has been using ibuprofen, but that has not helped. She has taken hydrocodone previously, but her insurance will not allow her to take this for migraine (was prescribed for her knee pain).   She states that she has been stressed out and missed 3 of her BP appointments.  She has been out of HCTZ, and is low on topamax.  Past Medical History:  Diagnosis Date  . Allergy    SEASONAL  . 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 Anxiety   . Arthritis 12/04/2016   Phreesia 05/23/2020  . Asthma   . Back pain with right-sided sciatica 06/18/2016  . Bipolar disorder (HCC) 01/15/2016   Overview:  Tried Depakote  . Body mass index 36.0-36.9, adult 04/10/2016  . Body mass index 37.0-37.9, adult 03/13/2016  . Depression   . Depressive disorder, not elsewhere classified 03/27/2007  . Dysmenorrhea 04/10/2016  . Family history of adverse reaction to anesthesia    PONV  . GERD (gastroesophageal reflux disease)   . Hemorrhoids 02/10/2015  . Hiatal hernia   . Hyperlipidemia 04/09/2011  . Hypertension    no meds  . 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 Laceration of blood vessel of right little finger 08/23/2020  . Laceration of finger of left hand without damage to nail    left small finger  . Menorrhagia with regular cycle 04/10/2016  . Migraine 01/15/2016   Overview:  Overview:  IMO Load 2016 R1.3  . Muscle spasm of back 03/13/2016  . Numbness and tingling in left hand 08/19/2016  . Obesity   . Ovarian cyst 12/18/2016   . Perianal wart 02/10/2015  . Stress 02/14/2016  . Weight loss counseling, encounter for 03/13/2016    Past Surgical History:  Procedure Laterality Date  . ABLATION    . CESAREAN SECTION    . CESAREAN SECTION N/A    Phreesia 05/23/2020  . CHOLECYSTECTOMY    . COLONOSCOPY    . DILATATION AND CURETTAGE/HYSTEROSCOPY WITH MINERVA N/A 08/31/2019   Procedure: DILATATION AND CURETTAGE (no specimen) /HYSTEROSCOPY WITH MINERVA ENDOMETRIAL ABLATION;  Surgeon: Tilda Burrow, MD;  Location: [facility];  Service: Gynecology;  Laterality: N/A;  . ESOPHAGOGASTRODUODENOSCOPY ENDOSCOPY    . TENDON EXPLORATION Left 08/10/2020   Procedure: LEFT SMALL FINER EXPLORATION, REPAIR OF TENDON, ARTERY, NERVE;  Surgeon: Betha Loa, MD;  Location: [facility];  Service: Orthopedics;  Laterality: Left;  . TUBAL LIGATION      Family History  Problem Relation Age of Onset  . Diabetes Mother   . Kidney failure Mother   . Early death Mother 14       diabetes complications  . Cancer Father 63       colon cancer  . Depression Sister   . Hypertension Sister   . Anxiety disorder Sister   . Other Sister        back problems  . ADD /  ADHD Daughter   . Other Daughter        behavioral issues  . ODD Daughter   . Asthma Son   . Cancer Maternal Grandmother        breast  . Seizures Maternal Grandmother   . Lupus Other   . Breast cancer Maternal Aunt     Social History   Socioeconomic History  . Marital status: Legally Separated    Spouse name: Not on file  . Number of children: 2  . Years of education: 42  . Highest education level: Not on file  Occupational History  . Not on file  Tobacco Use  . Smoking status: Current Every Day Smoker    Packs/day: 1.00    Years: 20.00    Pack years: 20.00    Types: Cigarettes  . Smokeless tobacco: Never Used  . Tobacco comment: trying to quit  Vaping Use  . Vaping Use: Never used  Substance and Sexual Activity  . Alcohol use: Yes    Comment: occ   . Drug use: No  . Sexual activity: Yes    Birth control/protection: Surgical    Comment: tubal  Other Topics Concern  . 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 Difficulty of Paying Living Expenses: Not hard at all  Food Insecurity: No Food Insecurity  . Worried About Programme researcher, broadcasting/film/video in the Last Year: Never true  . Ran Out of Food in the Last Year: Never true  Transportation Needs: No Transportation Needs  . Lack of Transportation (Medical): No  . Lack of Transportation (Non-Medical): No  Physical Activity: Insufficiently Active  . Days of Exercise per Week: 2 days  . Minutes of Exercise per Session: 30 min  Stress: No Stress Concern Present  . Feeling of Stress : Not at all  Social Connections: Moderately Isolated  . Frequency of Communication with Friends and Family: More than three times a week  . Frequency of Social Gatherings with Friends and Family: More than three times a week  . Attends Religious Services: More than 4 times per year  . Active Member of Clubs or Organizations: No  . Attends Banker Meetings: Never  . Marital Status: Separated  Intimate Partner Violence: Not At Risk  . Fear of Current or Ex-Partner: No  . Emotionally Abused: No  . Physically Abused: No  . Sexually Abused: No    Outpatient Medications Prior to Visit  Medication Sig Dispense Refill  . albuterol (VENTOLIN HFA) 108 (90 Base) MCG/ACT inhaler Inhale 1-2 puffs into the lungs every 6 (six) hours as needed for wheezing  or shortness of breath. 18 g 1  . buPROPion (WELLBUTRIN XL) 300 MG 24 hr tablet Take 1 tablet (300 mg total) by mouth daily. 90 tablet 0  .  fluticasone (FLONASE) 50 MCG/ACT nasal spray Place 2 sprays into both nostrils daily. 16 g 2  . hydrochlorothiazide (HYDRODIURIL) 12.5 MG tablet Take 1 tablet (12.5 mg total) by mouth daily. 30 tablet 1  . HYDROcodone-acetaminophen (NORCO/VICODIN) 5-325 MG tablet One tablet every six hours for pain.  Limit 7 days. 28 tablet 0  . omeprazole (PRILOSEC) 40 MG capsule Take 40 mg by mouth daily.    [name] topiramate (TOPAMAX) 100 MG tablet Take 1 tablet (100 mg total) by mouth daily. (Patient not taking: Reported on 01/10/2021) 30 tablet 0   No facility-administered medications prior to visit.    Allergies  Allergen Reactions  . Bee Venom Shortness Of Breath  . Latex Itching  . Lisinopril Cough    coughing    Review of Systems  Constitutional: Negative.   Respiratory: Negative.   Cardiovascular: Negative.   Neurological: Positive for headaches.       Objective:    Physical Exam Constitutional:      Appearance: She is well-developed.  Cardiovascular:     Rate and Rhythm: Normal rate and regular rhythm.  Pulmonary:     Effort: Pulmonary effort is normal.     Breath sounds: Normal breath sounds.  Neurological:     Mental Status: She is alert and oriented to person, place, and time. Mental status is at baseline.     GCS: GCS eye subscore is 4. GCS verbal subscore is 5. GCS motor subscore is 6.     Cranial Nerves: Dysarthria present. No cranial nerve deficit or facial asymmetry.     BP (!) 143/84   Pulse 87   Temp 98.3 F (36.8 C)   Resp 20   Ht 5' (1.524 m)   Wt 195 lb (88.5 kg)   SpO2 98%   BMI 38.08 kg/m  Wt Readings from Last 3 Encounters:  01/10/21 195 lb (88.5 kg)  12/21/20 200 lb (90.7 kg)  12/05/20 202 lb (91.6 kg)    Health Maintenance Due  Topic Date Due  . Hepatitis C Screening  Never done  . COVID-19 Vaccine (1) Never  done    There are no preventive care reminders to display for this patient.   Lab Results  Component Value Date   TSH 2.34 12/04/2016   Lab Results  Component Value Date   WBC 9.6 06/01/2020   HGB 15.8 (H) 06/01/2020   HCT 46.8 (H) 06/01/2020   MCV 94.4 06/01/2020   PLT 248 06/01/2020   Lab Results  Component Value Date   NA 139 06/01/2020   K 4.6 06/01/2020   CO2 28 06/01/2020   GLUCOSE 79 06/01/2020   BUN 9 06/01/2020   CREATININE 0.70 06/01/2020   BILITOT 0.5 06/01/2020   ALKPHOS 78 09/10/2019   AST 14 06/01/2020   ALT 12 06/01/2020   PROT 6.7 06/01/2020   ALBUMIN 4.1 09/10/2019   CALCIUM 9.6 06/01/2020   ANIONGAP 6 09/10/2019   Lab Results  Component Value Date   CHOL 217 (H) 06/01/2020   Lab Results  Component Value Date   HDL 45 (L) 06/01/2020   Lab Results  Component Value Date   LDLCALC 140 (H) 06/01/2020   Lab Results  Component Value Date   TRIG 186 (H) 06/01/2020   Lab Results  Component Value Date   CHOLHDL 4.8 06/01/2020   Lab Results  Component Value Date   HGBA1C 5.2 06/01/2020   HGBA1C 5.2 06/01/2020   HGBA1C 5.2 (A) 06/01/2020   HGBA1C 5.2 06/01/2020  Assessment & Plan:   Problem List Items Addressed This Visit      Cardiovascular and Mediastinum   Essential hypertension    -slightly elevated today, but she has been out of HCTZ        Other   Headache    -has hx of migraine, and sound is making her HA worse -IM toradol today -Rx. rizatriptan      Relevant Medications   rizatriptan (MAXALT) 5 MG tablet       Meds ordered this encounter  Medications  . rizatriptan (MAXALT) 5 MG tablet    Sig: Take 1 tablet (5 mg total) by mouth as needed for migraine. May repeat in 2 hours if needed    Dispense:  10 tablet    Refill:  1     Heather Roberts, NP

## 2021-01-10 NOTE — Telephone Encounter (Signed)
Dr Hilda Lias gave her a 7-day course of norco. To my knowledge, we did not originally. We do not use narcotics for migraines or for chronic pain management (like knee pain).  There are many better options. For her migraine, she should use the maxalt that we sent in.. if that isn't working she will need to come into the office.

## 2021-01-10 NOTE — Patient Instructions (Signed)
If your headache is not better in after your toradol shot today and the rizatriptan, please return to the office.

## 2021-01-10 NOTE — Telephone Encounter (Signed)
Please advise 

## 2021-01-10 NOTE — Telephone Encounter (Signed)
Pt needs a refill on the Topamax

## 2021-01-10 NOTE — Assessment & Plan Note (Signed)
-  slightly elevated today, but she has been out of HCTZ

## 2021-01-10 NOTE — Telephone Encounter (Signed)
Pt came back to the office after seeing Ortho. Ortho advised that PCP needed to send in the Hydrocodone for Knee pain.  I advised the pt we typically do not do pain management , we refer out to Pain Management.  Told her a nurse would call

## 2021-01-10 NOTE — Telephone Encounter (Signed)
Patient came into the office about her medication.  Advised her she will need to follow up with her primary care doctor to continue refills for this medication.

## 2021-01-10 NOTE — Assessment & Plan Note (Signed)
-  has hx of migraine, and sound is making her HA worse -IM toradol today -Rx. rizatriptan

## 2021-01-10 NOTE — Addendum Note (Signed)
Addended by: Dellia Cloud on: 01/10/2021 11:18 AM   Modules accepted: Orders

## 2021-01-11 NOTE — Telephone Encounter (Signed)
Left message

## 2021-01-15 NOTE — Telephone Encounter (Signed)
Pt informed

## 2021-01-18 ENCOUNTER — Encounter: Payer: Self-pay | Admitting: Orthopaedic Surgery

## 2021-01-18 ENCOUNTER — Ambulatory Visit (INDEPENDENT_AMBULATORY_CARE_PROVIDER_SITE_OTHER): Payer: Medicaid Other | Admitting: Orthopaedic Surgery

## 2021-01-18 ENCOUNTER — Other Ambulatory Visit: Payer: Self-pay

## 2021-01-18 VITALS — BP 125/86 | HR 98 | Ht 60.0 in | Wt 199.1 lb

## 2021-01-18 DIAGNOSIS — M25562 Pain in left knee: Secondary | ICD-10-CM

## 2021-01-18 DIAGNOSIS — G8929 Other chronic pain: Secondary | ICD-10-CM | POA: Diagnosis not present

## 2021-01-18 DIAGNOSIS — F17210 Nicotine dependence, cigarettes, uncomplicated: Secondary | ICD-10-CM

## 2021-01-18 MED ORDER — HYDROCODONE-ACETAMINOPHEN 5-325 MG PO TABS: ORAL_TABLET | ORAL | 0 refills | Status: DC

## 2021-01-18 NOTE — Patient Instructions (Signed)

## 2021-01-18 NOTE — Progress Notes (Signed)
Patient Destiny Petty, female DOB:[birth year], 40 y.o. VZD:[id number]  Chief Complaint  Patient presents with  . Knee Pain    L/hurting bad today    HPI  Destiny Petty is a 40 y.o. female who has chronic left knee pain.  It has no giving way but swells and pops.  She has no new trauma.  The cold weather has made it worse.  She has no redness.   Body mass index is 38.89 kg/m.  ROS  Review of Systems  Constitutional: Positive for activity change.  Musculoskeletal: Positive for arthralgias, back pain, gait problem, joint swelling and myalgias.  Allergic/Immunologic: Positive for environmental allergies.  Psychiatric/Behavioral: Positive for self-injury.  All other systems reviewed and are negative.   All other systems reviewed and are negative.  The following is a summary of the past history medically, past history surgically, known current medicines, social history and family history.  This information is gathered electronically by the computer from prior information and documentation.  I review this each visit and have found including this information at this point in the chart is beneficial and informative.    Past Medical History:  Diagnosis Date  . Allergy    SEASONAL  . 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 Anxiety   . Arthritis 12/04/2016   Phreesia 05/23/2020  . Asthma   . Back pain with right-sided sciatica 06/18/2016  . Bipolar disorder (HCC) 01/15/2016   Overview:  Tried Depakote  . Body mass index 36.0-36.9, adult 04/10/2016  . Body mass index 37.0-37.9, adult 03/13/2016  . Depression   . Depressive disorder, not elsewhere classified 03/27/2007  . Dysmenorrhea 04/10/2016  . Family history of adverse reaction to anesthesia    PONV  . GERD (gastroesophageal reflux disease)   . Hemorrhoids 02/10/2015  . Hiatal hernia   . Hyperlipidemia 04/09/2011  . Hypertension    no meds  . 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 Laceration of blood  vessel of right little finger 08/23/2020  . Laceration of finger of left hand without damage to nail    left small finger  . Menorrhagia with regular cycle 04/10/2016  . Migraine 01/15/2016   Overview:  Overview:  IMO Load 2016 R1.3  . Muscle spasm of back 03/13/2016  . Numbness and tingling in left hand 08/19/2016  . Obesity   . Ovarian cyst 12/18/2016  . Perianal wart 02/10/2015  . Stress 02/14/2016  . Weight loss counseling, encounter for 03/13/2016    Past Surgical History:  Procedure Laterality Date  . ABLATION    . CESAREAN SECTION    . CESAREAN SECTION N/A    Phreesia 05/23/2020  . CHOLECYSTECTOMY    . COLONOSCOPY    . DILATATION AND CURETTAGE/HYSTEROSCOPY WITH MINERVA N/A 08/31/2019   Procedure: DILATATION AND CURETTAGE (no specimen) /HYSTEROSCOPY WITH MINERVA ENDOMETRIAL ABLATION;  Surgeon: Tilda Burrow, MD;  Location: [facility];  Service: Gynecology;  Laterality: N/A;  . ESOPHAGOGASTRODUODENOSCOPY ENDOSCOPY    . TENDON EXPLORATION Left 08/10/2020   Procedure: LEFT SMALL FINER EXPLORATION, REPAIR OF TENDON, ARTERY, NERVE;  Surgeon: Betha Loa, MD;  Location: [facility];  Service: Orthopedics;  Laterality: Left;  . TUBAL LIGATION      Family History  Problem Relation Age of Onset  . Diabetes Mother   . Kidney failure Mother   . Early death Mother 101       diabetes complications  . Cancer Father 51  colon cancer  . Depression Sister   . Hypertension Sister   . Anxiety disorder Sister   . Other Sister        back problems  . ADD / ADHD Daughter   . Other Daughter        behavioral issues  . ODD Daughter   . Asthma Son   . Cancer Maternal Grandmother        breast  . Seizures Maternal Grandmother   . Lupus Other   . Breast cancer Maternal Aunt     Social History Social History   Tobacco Use  . Smoking status: Current Every Day Smoker    Packs/day: 1.00    Years: 20.00    Pack years: 20.00    Types: Cigarettes  . Smokeless tobacco: Never Used   . Tobacco comment: trying to quit  Vaping Use  . Vaping Use: Never used  Substance Use Topics  . Alcohol use: Yes    Comment: occ  . Drug use: No    Allergies  Allergen Reactions  . Bee Venom Shortness Of Breath  . Latex Itching  . Lisinopril Cough    coughing    Current Outpatient Medications  Medication Sig Dispense Refill  . albuterol (VENTOLIN HFA) 108 (90 Base) MCG/ACT inhaler Inhale 1-2 puffs into the lungs every 6 (six) hours as needed for wheezing or shortness of breath. 18 g 1  . buPROPion (WELLBUTRIN XL) 300 MG 24 hr tablet Take 1 tablet (300 mg total) by mouth daily. 90 tablet 0  . fluticasone (FLONASE) 50 MCG/ACT nasal spray Place 2 sprays into both nostrils daily. 16 g 2  . hydrochlorothiazide (MICROZIDE) 12.5 MG capsule TAKE ONE TABLET BY MOUTH ONCE DAILY. 30 capsule 0  . omeprazole (PRILOSEC) 40 MG capsule Take 40 mg by mouth daily.    . rizatriptan (MAXALT) 5 MG tablet Take 1 tablet (5 mg total) by mouth as needed for migraine. May repeat in 2 hours if needed 10 tablet 1  . topiramate (TOPAMAX) 100 MG tablet TAKE (1) TABLET BY MOUTH ONCE DAILY. 30 tablet 0  . HYDROcodone-acetaminophen (NORCO/VICODIN) 5-325 MG tablet One tablet every six hours for pain.  Limit 7 days. 20 tablet 0   No current facility-administered medications for this visit.     Physical Exam  Blood pressure 125/86, pulse 98, height 5' (1.524 m), weight 199 lb 2 oz (90.3 kg).  Constitutional: overall normal hygiene, normal nutrition, well developed, normal grooming, normal body habitus. Assistive device:none  Musculoskeletal: gait and station Limp left, muscle tone and strength are normal, no tremors or atrophy is present.  .  Neurological: coordination overall normal.  Deep tendon reflex/nerve stretch intact.  Sensation normal.  Cranial nerves II-XII intact.   Skin:   Normal overall no scars, lesions, ulcers or rashes. No psoriasis.  Psychiatric: Alert and oriented x 3.  Recent memory  intact, remote memory unclear.  Normal mood and affect. Well groomed.  Good eye contact.  Cardiovascular: overall no swelling, no varicosities, no edema bilaterally, normal temperatures of the legs and arms, no clubbing, cyanosis and good capillary refill.  Lymphatic: palpation is normal.  Left knee with effusion, crepitus, ROM 0 to 110, limp left, stable.  NV intact.  All other systems reviewed and are negative   The patient has been educated about the nature of the problem(s) and counseled on treatment options.  The patient appeared to understand what I have discussed and is in agreement with it.  Encounter Diagnoses  Name Primary?  . Chronic pain of left knee Yes  . Nicotine dependence, cigarettes, uncomplicated     PLAN Call if any problems.  Precautions discussed.  Continue current medications.   Return to clinic 3 months   I have reviewed the [facility] Controlled Substance Reporting System web site prior to prescribing narcotic medicine for this patient.   Electronically Signed Darreld Mclean, MD 2/10/20228:23 AM

## 2021-02-13 ENCOUNTER — Telehealth: Payer: Self-pay

## 2021-02-13 NOTE — Telephone Encounter (Signed)
Please refill Hydrochlonthazie & Topirmate

## 2021-02-14 ENCOUNTER — Other Ambulatory Visit: Payer: Self-pay

## 2021-02-14 DIAGNOSIS — Z6838 Body mass index (BMI) 38.0-38.9, adult: Secondary | ICD-10-CM

## 2021-02-14 DIAGNOSIS — E6609 Other obesity due to excess calories: Secondary | ICD-10-CM

## 2021-02-14 DIAGNOSIS — I10 Essential (primary) hypertension: Secondary | ICD-10-CM

## 2021-02-14 MED ORDER — HYDROCHLOROTHIAZIDE 12.5 MG PO CAPS: ORAL_CAPSULE | ORAL | 2 refills | Status: DC

## 2021-02-14 MED ORDER — TOPIRAMATE 100 MG PO TABS: ORAL_TABLET | ORAL | 2 refills | Status: DC

## 2021-02-14 NOTE — Telephone Encounter (Signed)
Refill sent.

## 2021-02-26 ENCOUNTER — Other Ambulatory Visit: Payer: Self-pay | Admitting: Family Medicine

## 2021-02-26 ENCOUNTER — Other Ambulatory Visit: Payer: Self-pay | Admitting: Nurse Practitioner

## 2021-02-26 DIAGNOSIS — F17218 Nicotine dependence, cigarettes, with other nicotine-induced disorders: Secondary | ICD-10-CM

## 2021-03-09 ENCOUNTER — Encounter: Payer: Self-pay | Admitting: Emergency Medicine

## 2021-03-09 ENCOUNTER — Ambulatory Visit
Admission: EM | Admit: 2021-03-09 | Discharge: 2021-03-09 | Disposition: A | Discharge status: Home / Self Care | Payer: Medicaid Other | Attending: Emergency Medicine | Admitting: Emergency Medicine

## 2021-03-09 ENCOUNTER — Other Ambulatory Visit: Payer: Self-pay

## 2021-03-09 DIAGNOSIS — R0981 Nasal congestion: Secondary | ICD-10-CM | POA: Diagnosis not present

## 2021-03-09 DIAGNOSIS — R062 Wheezing: Secondary | ICD-10-CM

## 2021-03-09 DIAGNOSIS — J0140 Acute pansinusitis, unspecified: Secondary | ICD-10-CM

## 2021-03-09 DIAGNOSIS — J209 Acute bronchitis, unspecified: Secondary | ICD-10-CM

## 2021-03-09 MED ORDER — PREDNISONE 10 MG (21) PO TBPK: ORAL_TABLET | Freq: Every day | ORAL | 0 refills | Status: DC

## 2021-03-09 MED ORDER — ALBUTEROL SULFATE HFA 108 (90 BASE) MCG/ACT IN AERS
2.0000 | INHALATION_SPRAY | Freq: Once | RESPIRATORY_TRACT | Status: AC
  Administered 2021-03-09: 2 via RESPIRATORY_TRACT

## 2021-03-09 MED ORDER — AMOXICILLIN-POT CLAVULANATE 875-125 MG PO TABS: 1.0000 | ORAL_TABLET | Freq: Two times a day (BID) | ORAL | 0 refills | Status: AC

## 2021-03-09 MED ORDER — DEXAMETHASONE SODIUM PHOSPHATE 10 MG/ML IJ SOLN
10.0000 mg | Freq: Once | INTRAMUSCULAR | Status: AC
  Administered 2021-03-09: 10 mg via INTRAMUSCULAR

## 2021-03-09 NOTE — ED Triage Notes (Addendum)
Wheezing , nasal drainage and cough x 2 weeks.  Using benadryl, dayquil and Theraflu with no relief.

## 2021-03-09 NOTE — ED Provider Notes (Signed)
[facility]   [id number] 03/09/21 Arrival Time: 1414  Cc: COUGH  SUBJECTIVE:  Destiny Petty is a 40 y.o. female who presents with sinus pain, pressure, nonproductive cough, congestion, and wheezing x 2 weeks.  Denies positive sick exposure or precipitating event.  Describes cough as intermittent and nonproductive.  Has tried OTC medications without relief.  Symptoms are made worse with deep breath.  Reports previous symptoms in the past.  Denies fever, chest pain, nausea, changes in bowel or bladder habits.    ROS: As per HPI.  All other pertinent ROS negative.     Past Medical History:  Diagnosis Date  . Allergy    SEASONAL  . 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 Anxiety   . Arthritis 12/04/2016   Phreesia 05/23/2020  . Asthma   . Back pain with right-sided sciatica 06/18/2016  . Bipolar disorder (HCC) 01/15/2016   Overview:  Tried Depakote  . Body mass index 36.0-36.9, adult 04/10/2016  . Body mass index 37.0-37.9, adult 03/13/2016  . Depression   . Depressive disorder, not elsewhere classified 03/27/2007  . Dysmenorrhea 04/10/2016  . Family history of adverse reaction to anesthesia    PONV  . GERD (gastroesophageal reflux disease)   . Hemorrhoids 02/10/2015  . Hiatal hernia   . Hyperlipidemia 04/09/2011  . Hypertension    no meds  . 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 Laceration of blood vessel of right little finger 08/23/2020  . Laceration of finger of left hand without damage to nail    left small finger  . Menorrhagia with regular cycle 04/10/2016  . Migraine 01/15/2016   Overview:  Overview:  IMO Load 2016 R1.3  . Muscle spasm of back 03/13/2016  . Numbness and tingling in left hand 08/19/2016  . Obesity   . Ovarian cyst 12/18/2016  . Perianal wart 02/10/2015  . Stress 02/14/2016  . Weight loss counseling, encounter for 03/13/2016   Past Surgical History:  Procedure Laterality Date  . ABLATION    . CESAREAN SECTION    . CESAREAN SECTION N/A     Phreesia 05/23/2020  . CHOLECYSTECTOMY    . COLONOSCOPY    . DILATATION AND CURETTAGE/HYSTEROSCOPY WITH MINERVA N/A 08/31/2019   Procedure: DILATATION AND CURETTAGE (no specimen) /HYSTEROSCOPY WITH MINERVA ENDOMETRIAL ABLATION;  Surgeon: Tilda Burrow, MD;  Location: [facility];  Service: Gynecology;  Laterality: N/A;  . ESOPHAGOGASTRODUODENOSCOPY ENDOSCOPY    . TENDON EXPLORATION Left 08/10/2020   Procedure: LEFT SMALL FINER EXPLORATION, REPAIR OF TENDON, ARTERY, NERVE;  Surgeon: Betha Loa, MD;  Location: [facility];  Service: Orthopedics;  Laterality: Left;  . TUBAL LIGATION     Allergies  Allergen Reactions  . Bee Venom Shortness Of Breath  . Latex Itching  . Lisinopril Cough    coughing   No current facility-administered medications on file prior to encounter.   Current Outpatient Medications on File Prior to Encounter  Medication Sig Dispense Refill  . albuterol (VENTOLIN HFA) 108 (90 Base) MCG/ACT inhaler Inhale 1-2 puffs into the lungs every 6 (six) hours as needed for wheezing or shortness of breath. 18 g 1  . buPROPion (WELLBUTRIN XL) 300 MG 24 hr tablet TAKE 1 TABLET BY MOUTH ONCE A DAY. 90 tablet 0  . fluticasone (FLONASE) 50 MCG/ACT nasal spray Place 2 sprays into both nostrils daily. 16 g 2  . hydrochlorothiazide (MICROZIDE) 12.5 MG capsule TAKE ONE TABLET BY MOUTH ONCE DAILY. 30 capsule 2  .  HYDROcodone-acetaminophen (NORCO/VICODIN) 5-325 MG tablet One tablet every six hours for pain.  Limit 7 days. 20 tablet 0  . omeprazole (PRILOSEC) 40 MG capsule Take 40 mg by mouth daily.    . rizatriptan (MAXALT) 5 MG tablet TAKE (1) TABLET BY MOUTH AS NEEDED FOR MIGRAINE ( MAY REPEAT IN 2 HOURS IF NEEDED0 12 tablet 0  . topiramate (TOPAMAX) 100 MG tablet TAKE (1) TABLET BY MOUTH ONCE DAILY. 30 tablet 2    Social History   Socioeconomic History  . Marital status: Legally Separated    Spouse name: Not on file  . Number of children: 2  . Years of education: 52  .  Highest education level: Not on file  Occupational History  . Not on file  Tobacco Use  . Smoking status: Current Every Day Smoker    Packs/day: 1.00    Years: 20.00    Pack years: 20.00    Types: Cigarettes  . Smokeless tobacco: Never Used  . Tobacco comment: trying to quit  Vaping Use  . Vaping Use: Never used  Substance and Sexual Activity  . Alcohol use: Yes    Comment: occ  . Drug use: No  . Sexual activity: Yes    Birth control/protection: Surgical    Comment: tubal  Other Topics Concern  . 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 Difficulty of Paying Living Expenses: Not hard at all  Food Insecurity: No Food Insecurity  . Worried About Programme researcher, broadcasting/film/video in the Last Year: Never true  . Ran Out of Food in the Last Year: Never true  Transportation Needs: No Transportation Needs  . Lack of Transportation (Medical): No  . Lack of Transportation (Non-Medical): No  Physical Activity: Insufficiently Active  . Days of Exercise per Week: 2 days  . Minutes of Exercise per Session: 30 min  Stress: No Stress Concern Present  . Feeling of Stress : Not at all  Social Connections: Moderately Isolated  . Frequency of Communication with Friends and Family: More than three times a week  . Frequency of Social Gatherings with Friends and Family: More than three times a week  . Attends Religious Services: More than 4 times per year  . Active Member of Clubs or Organizations: No  . Attends Banker Meetings: Never   . Marital Status: Separated  Intimate Partner Violence: Not At Risk  . Fear of Current or Ex-Partner: No  . Emotionally Abused: No  . Physically Abused: No  . Sexually Abused: No   Family History  Problem Relation Age of Onset  . Diabetes Mother   . Kidney failure Mother   . Early death Mother 27       diabetes complications  . Cancer Father 58       colon  cancer  . Depression Sister   . Hypertension Sister   . Anxiety disorder Sister   . Other Sister        back problems  . ADD / ADHD Daughter   . Other Daughter        behavioral issues  . ODD Daughter   . Asthma Son   . Cancer Maternal Grandmother        breast  . Seizures Maternal Grandmother   . Lupus Other   . Breast cancer Maternal Aunt      OBJECTIVE:  Vitals:   03/09/21 1432  BP: (!) 147/86  Pulse: (!) 107  Resp: 18  Temp: 98 F (36.7 C)  TempSrc: Oral  SpO2: 95%    General appearance: Alert, appears fatigued, but nontoxic; speaking in full sentences without difficulty HEENT:NCAT; Ears: EACs clear, TMs pearly gray; Eyes: PERRL.  EOM grossly intact. Nose: nares patent without rhinorrhea; Throat: tonsils nonerythematous or enlarged, uvula midline  Neck: supple without LAD Lungs: harsh rhonchi and wheezes diffuse about the lungs; normal respiratory effort; moderate cough present Heart: regular rate and rhythm.   Skin: warm and dry Psychological: alert and cooperative; normal mood and affect  ASSESSMENT & PLAN:  1. Sinus congestion   2. Acute non-recurrent pansinusitis   3. Wheezing   4. Acute bronchitis, unspecified organism     Meds ordered this encounter  Medications  . albuterol (VENTOLIN HFA) 108 (90 Base) MCG/ACT inhaler 2 puff  . predniSONE (STERAPRED UNI-PAK 21 TAB) 10 MG (21) TBPK tablet    Sig: Take by mouth daily. Take 6 tabs by mouth daily  for 2 days, then 5 tabs for 2 days, then 4 tabs for 2 days, then 3 tabs for 2 days, 2 tabs for 2 days, then 1 tab by mouth daily for 2 days     Dispense:  42 tablet    Refill:  0    Order Specific Question:   Supervising Provider    Answer:   Eustace Moore [[id number]]  . amoxicillin-clavulanate (AUGMENTIN) 875-125 MG tablet    Sig: Take 1 tablet by mouth every 12 (twelve) hours for 10 days.    Dispense:  20 tablet    Refill:  0    Order Specific Question:   Supervising Provider    Answer:   Eustace Moore [[id number]]  . dexamethasone (DECADRON) injection 10 mg    Get plenty of rest and push fluids Prescribed prednisone and Augmentin Steroid shot given in office Use OTC medication as needed for symptomatic relief Follow up with PCP for recheck and/or if symptoms persists Return or go to ER if you have any new or worsening symptoms such as fever, chills, fatigue, shortness of breath, wheezing, chest pain, nausea, changes in bowel or bladder habits, etc...  Reviewed expectations re: course of current medical issues. Questions answered. Outlined signs and symptoms indicating need for more acute intervention. Patient verbalized understanding. After Visit Summary given.          Rennis Harding, PA-C 03/09/21 1500

## 2021-03-09 NOTE — Discharge Instructions (Signed)
Get plenty of rest and push fluids Prescribed prednisone and Augmentin Steroid shot given in office Use OTC medication as needed for symptomatic relief Follow up with PCP for recheck and/or if symptoms persists Return or go to ER if you have any new or worsening symptoms such as fever, chills, fatigue, shortness of breath, wheezing, chest pain, nausea, changes in bowel or bladder habits, etc..[name]

## 2021-03-12 ENCOUNTER — Ambulatory Visit: Payer: Medicaid Other | Admitting: Nurse Practitioner

## 2021-03-12 ENCOUNTER — Other Ambulatory Visit: Payer: Self-pay

## 2021-03-12 ENCOUNTER — Encounter: Payer: Self-pay | Admitting: Nurse Practitioner

## 2021-03-12 VITALS — BP 155/83 | HR 88 | Temp 97.6°F | Resp 20 | Ht 60.0 in | Wt 207.0 lb

## 2021-03-12 DIAGNOSIS — Z139 Encounter for screening, unspecified: Secondary | ICD-10-CM

## 2021-03-12 DIAGNOSIS — R519 Headache, unspecified: Secondary | ICD-10-CM

## 2021-03-12 DIAGNOSIS — I10 Essential (primary) hypertension: Secondary | ICD-10-CM | POA: Diagnosis not present

## 2021-03-12 DIAGNOSIS — B373 Candidiasis of vulva and vagina: Secondary | ICD-10-CM

## 2021-03-12 DIAGNOSIS — G8929 Other chronic pain: Secondary | ICD-10-CM | POA: Diagnosis not present

## 2021-03-12 DIAGNOSIS — B3731 Acute candidiasis of vulva and vagina: Secondary | ICD-10-CM

## 2021-03-12 MED ORDER — RIZATRIPTAN BENZOATE 10 MG PO TBDP: 10.0000 mg | ORAL_TABLET | ORAL | 0 refills | Status: DC | PRN

## 2021-03-12 MED ORDER — AMLODIPINE BESYLATE 5 MG PO TABS: 5.0000 mg | ORAL_TABLET | Freq: Every day | ORAL | 1 refills | Status: DC

## 2021-03-12 MED ORDER — FLUCONAZOLE 150 MG PO TABS: ORAL_TABLET | ORAL | 0 refills | Status: DC

## 2021-03-12 NOTE — Assessment & Plan Note (Signed)
-  maxalt works really well for her migraines -increased maxalt to 10 mg

## 2021-03-12 NOTE — Progress Notes (Signed)
Acute Office Visit  Subjective:    Patient ID: Destiny Petty, female    DOB: 03-06-1981, 40 y.o.   MRN: [id number]  Chief Complaint  Patient presents with  . Hypertension    Follow up  . Migraine    Follow up    HPI Patient is in today for follow-up visit for migraine and HTN.  At her last OV, she had been out of HCTZ. This was refilled, but her BP is still elevated today.  For migraine, she was started on maxalt.  She states that the maxalt has helped a lot.  She would like to go up on the dosage because it works so well.  She states that she has been sick for 2 weeks, but she is taking meds for this.  She is taking augmentin, and she states that he has developed vaginal itching fo rthe past 2-3 days.   Past Medical History:  Diagnosis Date  . Allergy    SEASONAL  . 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 Anxiety   . Arthritis 12/04/2016   Phreesia 05/23/2020  . Asthma   . Back pain with right-sided sciatica 06/18/2016  . Bipolar disorder ([place]) 01/15/2016   Overview:  Tried Depakote  . Body mass index 36.0-36.9, adult 04/10/2016  . Body mass index 37.0-37.9, adult 03/13/2016  . Depression   . Depressive disorder, not elsewhere classified 03/27/2007  . Dysmenorrhea 04/10/2016  . Family history of adverse reaction to anesthesia    PONV  . GERD (gastroesophageal reflux disease)   . Hemorrhoids 02/10/2015  . Hiatal hernia   . Hyperlipidemia 04/09/2011  . Hypertension    no meds  . 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 Laceration of blood vessel of right little finger 08/23/2020  . Laceration of finger of left hand without damage to nail    left small finger  . Menorrhagia with regular cycle 04/10/2016  . Migraine 01/15/2016   Overview:  Overview:  IMO Load 2016 R1.3  . Muscle spasm of back 03/13/2016  . Numbness and tingling in left hand 08/19/2016  . Obesity   . Ovarian cyst 12/18/2016  . Perianal wart 02/10/2015  . Stress 02/14/2016  . Weight loss counseling,  encounter for 03/13/2016    Past Surgical History:  Procedure Laterality Date  . ABLATION    . CESAREAN SECTION    . CESAREAN SECTION N/A    Phreesia 05/23/2020  . CHOLECYSTECTOMY    . COLONOSCOPY    . DILATATION AND CURETTAGE/HYSTEROSCOPY WITH MINERVA N/A 08/31/2019   Procedure: DILATATION AND CURETTAGE (no specimen) /HYSTEROSCOPY WITH MINERVA ENDOMETRIAL ABLATION;  Surgeon: Jonnie Kind, MD;  Location: [facility];  Service: Gynecology;  Laterality: N/A;  . ESOPHAGOGASTRODUODENOSCOPY ENDOSCOPY    . TENDON EXPLORATION Left 08/10/2020   Procedure: LEFT SMALL FINER EXPLORATION, REPAIR OF TENDON, ARTERY, NERVE;  Surgeon: Leanora Cover, MD;  Location: [facility];  Service: Orthopedics;  Laterality: Left;  . TUBAL LIGATION      Family History  Problem Relation Age of Onset  . Diabetes Mother   . Kidney failure Mother   . Early death Mother 53       diabetes complications  . Cancer Father 81       colon cancer  . Depression Sister   . Hypertension Sister   . Anxiety disorder Sister   . Other Sister        back problems  . ADD / ADHD Daughter   .  Other Daughter        behavioral issues  . ODD Daughter   . Asthma Son   . Cancer Maternal Grandmother        breast  . Seizures Maternal Grandmother   . Lupus Other   . Breast cancer Maternal Aunt     Social History   Socioeconomic History  . Marital status: Legally Separated    Spouse name: Not on file  . Number of children: 2  . Years of education: 68  . Highest education level: Not on file  Occupational History  . Not on file  Tobacco Use  . Smoking status: Current Every Day Smoker    Packs/day: 1.00    Years: 20.00    Pack years: 20.00    Types: Cigarettes  . Smokeless tobacco: Never Used  . Tobacco comment: trying to quit  Vaping Use  . Vaping Use: Never used  Substance and Sexual Activity  . Alcohol use: Yes    Comment: occ  . Drug use: No  . Sexual activity: Yes    Birth control/protection:  Surgical    Comment: tubal  Other Topics Concern  . 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 Difficulty of Paying Living Expenses: Not hard at all  Food Insecurity: No Food Insecurity  . Worried About Charity fundraiser in the Last Year: Never true  . Ran Out of Food in the Last Year: Never true  Transportation Needs: No Transportation Needs  . Lack of Transportation (Medical): No  . Lack of Transportation (Non-Medical): No  Physical Activity: Insufficiently Active  . Days of Exercise per Week: 2 days  . Minutes of Exercise per Session: 30 min  Stress: No Stress Concern Present  . Feeling of Stress : Not at all  Social Connections: Moderately Isolated  . Frequency of Communication with Friends and Family: More than three times a week  . Frequency of Social Gatherings with Friends and Family: More than three times a week  . Attends Religious Services: More than 4 times per year  . Active Member of Clubs or Organizations: No  . Attends Archivist Meetings: Never  . Marital Status: Separated  Intimate Partner Violence: Not At Risk  . Fear of Current or Ex-Partner: No  . Emotionally Abused: No  . Physically Abused: No  . Sexually Abused: No    Outpatient Medications Prior to Visit  Medication Sig Dispense Refill  . albuterol (VENTOLIN HFA) 108 (90 Base) MCG/ACT inhaler Inhale 1-2 puffs into the lungs every 6 (six) hours as needed for wheezing or shortness of breath. 18 g 1  . amoxicillin-clavulanate (AUGMENTIN)  875-125 MG tablet Take 1 tablet by mouth every 12 (twelve) hours for 10 days. 20 tablet 0  . buPROPion (WELLBUTRIN XL) 300 MG  24 hr tablet TAKE 1 TABLET BY MOUTH ONCE A DAY. 90 tablet 0  . fluticasone (FLONASE) 50 MCG/ACT nasal spray Place 2 sprays into both nostrils daily. 16 g 2  . hydrochlorothiazide (MICROZIDE) 12.5 MG capsule TAKE ONE TABLET BY MOUTH ONCE DAILY. 30 capsule 2  . HYDROcodone-acetaminophen (NORCO/VICODIN) 5-325 MG tablet One tablet every six hours for pain.  Limit 7 days. 20 tablet 0  . omeprazole (PRILOSEC) 40 MG capsule Take 40 mg by mouth daily.    . predniSONE (STERAPRED UNI-PAK 21 TAB) 10 MG (21) TBPK tablet Take by mouth daily. Take 6 tabs by mouth daily  for 2 days, then 5 tabs for 2 days, then 4 tabs for 2 days, then 3 tabs for 2 days, 2 tabs for 2 days, then 1 tab by mouth daily for 2 days 42 tablet 0  . topiramate (TOPAMAX) 100 MG tablet TAKE (1) TABLET BY MOUTH ONCE DAILY. 30 tablet 2  . rizatriptan (MAXALT) 5 MG tablet TAKE (1) TABLET BY MOUTH AS NEEDED FOR MIGRAINE ( MAY REPEAT IN 2 HOURS IF NEEDED0 12 tablet 0   No facility-administered medications prior to visit.    Allergies  Allergen Reactions  . Bee Venom Shortness Of Breath  . Latex Itching  . Lisinopril Cough    coughing    Review of Systems  Constitutional: Negative.   HENT: Negative.   Respiratory: Negative.   Cardiovascular: Negative.   Genitourinary:       Vaginal itching  Neurological: Positive for headaches.       Improved since taking maxalt       Objective:    Physical Exam Constitutional:      Appearance: She is obese.  Cardiovascular:     Rate and Rhythm: Normal rate and regular rhythm.     Pulses: Normal pulses.     Heart sounds: Normal heart sounds.  Pulmonary:     Effort: Pulmonary effort is normal.     Breath sounds: Normal breath sounds.  Neurological:     General: No focal deficit present.     Mental Status: She is alert and oriented to person, place, and time.      BP (!) 155/83   Pulse 88   Temp 97.6 F (36.4 C)   Resp 20   Ht 5' (1.524 m)   Wt 207 lb (93.9 kg)   SpO2 96%   BMI 40.43 kg/m  Wt Readings from Last 3 Encounters:  03/12/21 207 lb (93.9 kg)  01/18/21 199 lb 2 oz (90.3 kg)  01/10/21 195 lb (88.5 kg)    Health Maintenance Due  Topic Date Due  . Hepatitis C Screening  Never done  . COVID-19 Vaccine (1) Never done    There are no preventive care reminders to display for this patient.   Lab Results  Component Value Date   TSH 2.34 12/04/2016   Lab Results  Component Value Date   WBC 9.6 06/01/2020   HGB 15.8 (H) 06/01/2020   HCT 46.8 (H) 06/01/2020   MCV 94.4 06/01/2020   PLT 248 06/01/2020   Lab Results  Component Value Date   NA 139 06/01/2020   K 4.6 06/01/2020   CO2 28 06/01/2020   GLUCOSE 79 06/01/2020   BUN 9 06/01/2020   CREATININE 0.70 06/01/2020   BILITOT 0.5 06/01/2020   ALKPHOS 78 09/10/2019   AST 14 06/01/2020   ALT 12 06/01/2020   PROT 6.7 06/01/2020   ALBUMIN 4.1 09/10/2019   CALCIUM  9.6 06/01/2020   ANIONGAP 6 09/10/2019   Lab Results  Component Value Date   CHOL 217 (H) 06/01/2020   Lab Results  Component Value Date   HDL 45 (L) 06/01/2020   Lab Results  Component Value Date   LDLCALC 140 (H) 06/01/2020   Lab Results  Component Value Date   TRIG 186 (H) 06/01/2020   Lab Results  Component Value Date   CHOLHDL 4.8 06/01/2020   Lab Results  Component Value Date   HGBA1C 5.2 06/01/2020   HGBA1C 5.2 06/01/2020   HGBA1C 5.2 (A) 06/01/2020   HGBA1C 5.2 06/01/2020       Assessment & Plan:   Problem List Items Addressed This Visit      Cardiovascular and Mediastinum   Essential hypertension    -Rx. Amlodipine in addition to her HCTZ      Relevant Medications   amLODipine (NORVASC) 5 MG tablet   Other Relevant Orders   CBC with Differential/Platelet   CMP14+EGFR   Lipid Panel With LDL/HDL Ratio     Genitourinary   Vagina, candidiasis    -started after rx  augmentin -Rx. diflucan      Relevant Medications   fluconazole (DIFLUCAN) 150 MG tablet     Other   Headache    -maxalt works really well for her migraines -increased maxalt to 10 mg       Relevant Medications   rizatriptan (MAXALT-MLT) 10 MG disintegrating tablet   amLODipine (NORVASC) 5 MG tablet    Other Visit Diagnoses    Screening due    -  Primary   Relevant Orders   HCV Ab w/Rflx to Verification       Meds ordered this encounter  Medications  . fluconazole (DIFLUCAN) 150 MG tablet    Sig: Take 1 tablet PO now. Repeat dose in 72 hours.    Dispense:  2 tablet    Refill:  0  . rizatriptan (MAXALT-MLT) 10 MG disintegrating tablet    Sig: Take 1 tablet (10 mg total) by mouth as needed for migraine. May repeat in 2 hours if needed    Dispense:  10 tablet    Refill:  0    Increased dosage today (03/12/21)  . amLODipine (NORVASC) 5 MG tablet    Sig: Take 1 tablet (5 mg total) by mouth daily.    Dispense:  30 tablet    Refill:  [place], NP

## 2021-03-12 NOTE — Patient Instructions (Addendum)
I was planning on sending in a combo BP medication, but lisinopril causes a cough. I sent in amlodipine instead, but I did not see a combination option, so continue your HCTZ with the amlodipine.  Please have fasting labs drawn 2-3 days prior to your appointment so we can discuss the results during your office visit.

## 2021-03-12 NOTE — Assessment & Plan Note (Signed)
-  started after rx augmentin -Rx. diflucan

## 2021-03-12 NOTE — Assessment & Plan Note (Signed)
-  Rx. Amlodipine in addition to her HCTZ

## 2021-03-18 ENCOUNTER — Encounter: Payer: Self-pay | Admitting: Emergency Medicine

## 2021-03-18 ENCOUNTER — Ambulatory Visit (INDEPENDENT_AMBULATORY_CARE_PROVIDER_SITE_OTHER): Payer: Medicaid Other

## 2021-03-18 ENCOUNTER — Ambulatory Visit
Admission: EM | Admit: 2021-03-18 | Discharge: 2021-03-18 | Disposition: A | Discharge status: Home / Self Care | Payer: Medicaid Other | Attending: Physician Assistant | Admitting: Physician Assistant

## 2021-03-18 DIAGNOSIS — R062 Wheezing: Secondary | ICD-10-CM | POA: Diagnosis not present

## 2021-03-18 DIAGNOSIS — R059 Cough, unspecified: Secondary | ICD-10-CM

## 2021-03-18 DIAGNOSIS — J440 Chronic obstructive pulmonary disease with acute lower respiratory infection: Secondary | ICD-10-CM | POA: Diagnosis not present

## 2021-03-18 DIAGNOSIS — R21 Rash and other nonspecific skin eruption: Secondary | ICD-10-CM | POA: Diagnosis not present

## 2021-03-18 MED ORDER — AZITHROMYCIN 250 MG PO TABS: 250.0000 mg | ORAL_TABLET | Freq: Every day | ORAL | 0 refills | Status: AC

## 2021-03-18 MED ORDER — PREDNISONE 10 MG (21) PO TBPK: ORAL_TABLET | Freq: Every day | ORAL | 0 refills | Status: DC

## 2021-03-18 NOTE — ED Triage Notes (Signed)
Pt seen here 04/01 and was given steroids and abx.  Pt states she feels no better and is 100% sure she has PNE.

## 2021-03-18 NOTE — Discharge Instructions (Signed)
Take medications as prescribed Follow up with PCP  Recommend follow up with pulmonologist

## 2021-03-19 NOTE — ED Provider Notes (Addendum)
[facility]   CSN: [id number] Arrival date & time: 03/18/21  1440      History   Chief Complaint Chief Complaint  Patient presents with  . Cough    HPI Destiny Petty is a 40 y.o. female.   Pt complains of persistent cough, congestion, cough, and wheezing.  Seen 03/09/21. She was prescribed augmentin, prednisone. Pt completed both yesterday.  She reports noticing some improvement with prednisone. Pt reports multiple episodes of bronchitis over the last year. She has not seen a pulmonologist.  She denies current fever, chills, n/v/d, body aches.      Past Medical History:  Diagnosis Date  . Allergy    SEASONAL  . 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 Anxiety   . Arthritis 12/04/2016   Phreesia 05/23/2020  . Asthma   . Back pain with right-sided sciatica 06/18/2016  . Bipolar disorder ([facility]) 01/15/2016   Overview:  Tried Depakote  . Body mass index 36.0-36.9, adult 04/10/2016  . Body mass index 37.0-37.9, adult 03/13/2016  . Depression   . Depressive disorder, not elsewhere classified 03/27/2007  . Dysmenorrhea 04/10/2016  . Family history of adverse reaction to anesthesia    PONV  . GERD (gastroesophageal reflux disease)   . Hemorrhoids 02/10/2015  . Hiatal hernia   . Hyperlipidemia 04/09/2011  . Hypertension    no meds  . 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 Laceration of blood vessel of right little finger 08/23/2020  . Laceration of finger of left hand without damage to nail    left small finger  . Menorrhagia with regular cycle 04/10/2016  . Migraine 01/15/2016   Overview:  Overview:  IMO Load 2016 R1.3  . Muscle spasm of back 03/13/2016  . Numbness and tingling in left hand 08/19/2016  . Obesity   . Ovarian cyst 12/18/2016  . Perianal wart 02/10/2015  . Stress 02/14/2016  . Weight loss counseling, encounter for 03/13/2016    Patient Active Problem List   Diagnosis Date Noted  . Vagina, candidiasis 03/12/2021  . Headache 01/10/2021  .  Environmental and seasonal allergies 07/28/2020  . Arthritis of left knee 06/01/2020  . Screening for diabetes mellitus 06/01/2020  . Visit for screening mammogram 06/01/2020  . 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 Back pain with right-sided sciatica 06/18/2016  . Class 2 obesity due to excess calories without serious comorbidity with body mass index (BMI) of 38.0 to 38.9 in adult 03/13/2016  . Gallstones 01/29/2016  . Recurrent major depressive disorder, in full remission ([facility]CC) 01/15/2016  . Nicotine addiction 07/10/2013  . GERD (gastroesophageal reflux disease) 07/10/2013  . Essential hypertension 07/08/2013  . Asthma, mild persistent 04/05/2013  . Orthopnea 12/18/2011  . Hyperlipidemia 04/09/2011    Past Surgical History:  Procedure Laterality Date  . ABLATION    . CESAREAN SECTION    . CESAREAN SECTION N/A    Phreesia 05/23/2020  . CHOLECYSTECTOMY    . COLONOSCOPY    . DILATATION AND CURETTAGE/HYSTEROSCOPY WITH MINERVA N/A 08/31/2019   Procedure: DILATATION AND CURETTAGE (no specimen) /HYSTEROSCOPY WITH MINERVA ENDOMETRIAL ABLATION;  Surgeon: Tilda Burrow, MD;  Location: [facility];  Service: Gynecology;  Laterality: N/A;  . ESOPHAGOGASTRODUODENOSCOPY ENDOSCOPY    . TENDON EXPLORATION Left 08/10/2020   Procedure: LEFT SMALL FINER EXPLORATION, REPAIR OF TENDON, ARTERY, NERVE;  Surgeon: Betha Loa, MD;  Location: [facility];  Service: Orthopedics;  Laterality: Left;  . TUBAL  LIGATION      OB History    Gravida  2   Para  2   Term  2   Preterm      AB      Living  2     SAB      IAB      Ectopic      Multiple      Live Births  2            Home Medications    Prior to Admission medications   Medication Sig Start Date End Date Taking? Authorizing Provider  azithromycin (ZITHROMAX Z-PAK) 250 MG tablet Take 1 tablet (250 mg total) by mouth daily for 5 days. Take 2 tablets by mouth on day 1 and then 1 tablet by mouth daily  for 4 days. 03/18/21 03/23/21 Yes Valentina Alcoser, Shanda Bumps, PA-C  albuterol (VENTOLIN HFA) 108 (90 Base) MCG/ACT inhaler Inhale 1-2 puffs into the lungs every 6 (six) hours as needed for wheezing or shortness of breath. 12/05/20   Freddy Finner, NP  amLODipine (NORVASC) 5 MG tablet Take 1 tablet (5 mg total) by mouth daily. 03/12/21   Heather Roberts, NP  amoxicillin-clavulanate (AUGMENTIN) 875-125 MG tablet Take 1 tablet by mouth every 12 (twelve) hours for 10 days. 03/09/21 03/19/21  Wurst, Lowanda Foster, PA-C  buPROPion (WELLBUTRIN XL) 300 MG 24 hr tablet TAKE 1 TABLET BY MOUTH ONCE A DAY. 02/26/21   Heather Roberts, NP  fluconazole (DIFLUCAN) 150 MG tablet Take 1 tablet PO now. Repeat dose in 72 hours. 03/12/21   Heather Roberts, NP  fluticasone Aleda Grana) 50 MCG/ACT nasal spray Place 2 sprays into both nostrils daily. 09/28/20   Freddy Finner, NP  hydrochlorothiazide (MICROZIDE) 12.5 MG capsule TAKE ONE TABLET BY MOUTH ONCE DAILY. 02/14/21   Heather Roberts, NP  HYDROcodone-acetaminophen (NORCO/VICODIN) 5-325 MG tablet One tablet every six hours for pain.  Limit 7 days. 01/18/21   Darreld Mclean, MD  omeprazole (PRILOSEC) 40 MG capsule Take 40 mg by mouth daily.    [provider]  predniSONE (STERAPRED UNI-PAK 21 TAB) 10 MG (21) TBPK tablet Take by mouth daily. Take 6 tabs by mouth daily  for 2 days, then 5 tabs for 2 days, then 4 tabs for 2 days, then 3 tabs for 2 days, 2 tabs for 2 days, then 1 tab by mouth daily for 2 days 03/18/21   Jodell Cipro, PA-C  rizatriptan (MAXALT-MLT) 10 MG disintegrating tablet Take 1 tablet (10 mg total) by mouth as needed for migraine. May repeat in 2 hours if needed 03/12/21   Heather Roberts, NP  topiramate (TOPAMAX) 100 MG tablet TAKE (1) TABLET BY MOUTH ONCE DAILY. 02/14/21   Heather Roberts, NP    Family History Family History  Problem Relation Age of Onset  . Diabetes Mother   . Kidney failure Mother   . Early death Mother 75       diabetes complications  . Cancer Father  37       colon cancer  . Depression Sister   . Hypertension Sister   . Anxiety disorder Sister   . Other Sister        back problems  . ADD / ADHD Daughter   . Other Daughter        behavioral issues  . ODD Daughter   . Asthma Son   . Cancer Maternal Grandmother        breast  . Seizures  Maternal Grandmother   . Lupus Other   . Breast cancer Maternal Aunt     Social History Social History   Tobacco Use  . Smoking status: Current Every Day Smoker    Packs/day: 1.00    Years: 20.00    Pack years: 20.00    Types: Cigarettes  . Smokeless tobacco: Never Used  . Tobacco comment: trying to quit  Vaping Use  . Vaping Use: Never used  Substance Use Topics  . Alcohol use: Yes    Comment: occ  . Drug use: No     Allergies   Bee venom, Latex, and Lisinopril   Review of Systems Review of Systems  Constitutional: Negative for chills and fever.  HENT: Positive for congestion. Negative for ear pain, sinus pressure and sore throat.   Eyes: Negative for pain and visual disturbance.  Respiratory: Positive for cough and wheezing. Negative for shortness of breath.   Cardiovascular: Negative for chest pain and palpitations.  Gastrointestinal: Negative for abdominal pain, diarrhea, nausea and vomiting.  Genitourinary: Negative for dysuria and hematuria.  Musculoskeletal: Negative for arthralgias and back pain.  Skin: Negative for color change and rash.  Neurological: Negative for seizures and syncope.  All other systems reviewed and are negative.    Physical Exam Triage Vital Signs ED Triage Vitals [03/18/21 1502]  Enc Vitals Group     BP (!) 150/89     Pulse Rate 85     Resp 19     Temp 98.1 F (36.7 C)     Temp Source Oral     SpO2 97 %     Weight      Height      Head Circumference      Peak Flow      Pain Score 0     Pain Loc      Pain Edu?      Excl. in GC?    No data found.  Updated Vital Signs BP (!) 150/89 (BP Location: Right Arm)   Pulse 85   Temp  98.1 F (36.7 C) (Oral)   Resp 19   SpO2 97%   Visual Acuity Right Eye Distance:   Left Eye Distance:   Bilateral Distance:    Right Eye Near:   Left Eye Near:    Bilateral Near:     Physical Exam Vitals and nursing note reviewed.  Constitutional:      General: She is not in acute distress.    Appearance: She is well-developed.  HENT:     Head: Normocephalic and atraumatic.  Eyes:     Conjunctiva/sclera: Conjunctivae normal.  Cardiovascular:     Rate and Rhythm: Normal rate and regular rhythm.     Heart sounds: No murmur heard.   Pulmonary:     Effort: Pulmonary effort is normal. No respiratory distress.     Breath sounds: No stridor. Examination of the right-upper field reveals wheezing. Examination of the left-upper field reveals wheezing. Examination of the right-middle field reveals wheezing. Examination of the left-middle field reveals wheezing. Wheezing present.  Abdominal:     Palpations: Abdomen is soft.     Tenderness: There is no abdominal tenderness.  Musculoskeletal:     Cervical back: Neck supple.  Skin:    General: Skin is warm and dry.  Neurological:     Mental Status: She is alert.      UC Treatments / Results  Labs (all labs ordered are listed, but only abnormal results are displayed)  Labs Reviewed - No data to display  EKG   Radiology DG Chest 2 View  Result Date: 03/18/2021 CLINICAL DATA:  40 year old female with cough and wheezing. EXAM: CHEST - 2 VIEW COMPARISON:  Chest radiograph dated 01/06/2020 FINDINGS: The heart size and mediastinal contours are within normal limits. Both lungs are clear. The visualized skeletal structures are unremarkable. IMPRESSION: No active cardiopulmonary disease. Electronically Signed   By: Elgie Collard M.D.   On: 03/18/2021 15:54    Procedures Procedures (including critical care time)  Medications Ordered in UC Medications - No data to display  Initial Impression / Assessment and Plan / UC Course   I have reviewed the triage vital signs and the nursing notes.  Pertinent labs & imaging results that were available during my care of the patient were reviewed by me and considered in my medical decision making (see chart for details).     Persistent cough, congestion, wheezing.  Chest xray negative for pneumonia. Reported some improvement noted when on prednisone.  Will repeat a course of prednisone today.  Advised pt that seeing her PCP and possibly a pulmonologist in the future may be of benefit due to persistent sx and multiple exacerbations.  Strict return precautions given.  Final Clinical Impressions(s) / UC Diagnoses   Final diagnoses:  Bronchitis, chronic obstructive w acute bronchitis (HCC)     Discharge Instructions     Take medications as prescribed Follow up with PCP  Recommend follow up with pulmonologist    ED Prescriptions    Medication Sig Dispense Auth. Provider   predniSONE (STERAPRED UNI-PAK 21 TAB) 10 MG (21) TBPK tablet Take by mouth daily. Take 6 tabs by mouth daily  for 2 days, then 5 tabs for 2 days, then 4 tabs for 2 days, then 3 tabs for 2 days, 2 tabs for 2 days, then 1 tab by mouth daily for 2 days 42 tablet Cherelle Midkiff, PA-C   azithromycin (ZITHROMAX Z-PAK) 250 MG tablet Take 1 tablet (250 mg total) by mouth daily for 5 days. Take 2 tablets by mouth on day 1 and then 1 tablet by mouth daily for 4 days. 6 tablet Jodell Cipro, PA-C     PDMP not reviewed this encounter.   Jodell Cipro, PA-C 03/19/21 1814    Jodell Cipro, PA-C 03/19/21 1815

## 2021-03-26 ENCOUNTER — Other Ambulatory Visit: Payer: Self-pay

## 2021-03-26 ENCOUNTER — Encounter: Payer: Self-pay | Admitting: Internal Medicine

## 2021-03-26 ENCOUNTER — Ambulatory Visit (INDEPENDENT_AMBULATORY_CARE_PROVIDER_SITE_OTHER): Payer: Medicaid Other | Admitting: Internal Medicine

## 2021-03-26 ENCOUNTER — Other Ambulatory Visit: Payer: Self-pay | Admitting: Family Medicine

## 2021-03-26 DIAGNOSIS — J3089 Other allergic rhinitis: Secondary | ICD-10-CM

## 2021-03-26 DIAGNOSIS — R0609 Other forms of dyspnea: Secondary | ICD-10-CM | POA: Insufficient documentation

## 2021-03-26 DIAGNOSIS — R0600 Dyspnea, unspecified: Secondary | ICD-10-CM

## 2021-03-26 DIAGNOSIS — F17210 Nicotine dependence, cigarettes, uncomplicated: Secondary | ICD-10-CM | POA: Diagnosis not present

## 2021-03-26 DIAGNOSIS — J4530 Mild persistent asthma, uncomplicated: Secondary | ICD-10-CM

## 2021-03-26 MED ORDER — STIOLTO RESPIMAT 2.5-2.5 MCG/ACT IN AERS: 2.0000 | INHALATION_SPRAY | Freq: Every day | RESPIRATORY_TRACT | 0 refills | Status: DC

## 2021-03-26 MED ORDER — PANTOPRAZOLE SODIUM 40 MG PO TBEC: 40.0000 mg | DELAYED_RELEASE_TABLET | Freq: Every day | ORAL | 2 refills | Status: DC

## 2021-03-26 MED ORDER — FAMOTIDINE 20 MG PO TABS: ORAL_TABLET | ORAL | 11 refills | Status: DC

## 2021-03-26 NOTE — Assessment & Plan Note (Signed)
Active smoker  - 03/26/2021  After extensive coaching inhaler device,  effectiveness =    75% with smi > try stiolto 2 puff bid / max gerd rx   DDX of  difficult airways management almost all start with A and  include Adherence, Ace Inhibitors, Acid Reflux, Active Sinus Disease, Alpha 1 Antitripsin deficiency, Anxiety masquerading as Airways dz,  ABPA,  Allergy(esp in young), Aspiration (esp in elderly), Adverse effects of meds,  Active smoking or vaping, A bunch of PE's (a small clot burden can't cause this syndrome unless there is already severe underlying pulm or vascular dz with poor reserve) plus two Bs  = Bronchiectasis and Beta blocker use..and one C= CHF   Adherence is always the initial "prime suspect" and is a multilayered concern that requires a "trust but verify" approach in every patient - starting with knowing how to use medications, especially inhalers, correctly, keeping up with refills and understanding the fundamental difference between maintenance and prns vs those medications only taken for a very short course and then stopped and not refilled.  - see hfa teaching  - return in 2 weeks with all meds in hand using a trust but verify approach to confirm accurate Medication  Reconciliation The principal here is that until we are certain that the  patients are doing what we've asked, it makes no sense to ask them to do more.   Active smoking > see sep a/p  ? Acid (or non-acid) GERD > always difficult to exclude as up to 75% of pts in some series report no assoc GI/ Heartburn symptoms and she does have documented gerd by prior w/u > rec max (24h)  acid suppression and diet restrictions/ reviewed and instructions given in writing.   ? Active/acute sinus dz/ rhinitis has been seasonal/ watery and more c/w allegic rhinitis so rec 1st gen H1 blockers per guidelines  If tolerates   ? Anxiety/depression/ deconditioning > usually at the bottom of this list of usual suspects but   note already on  psychotropics and may interfere with ability to quit smoking and adherence and also interpretation of response or lack thereof to symptom management which can be quite subjective.

## 2021-03-26 NOTE — Assessment & Plan Note (Signed)
Counseled re importance of smoking cessation but did not meet time criteria for separate billing           Each maintenance medication was reviewed in detail including emphasizing most importantly the difference between maintenance and prns and under what circumstances the prns are to be triggered using an action plan format where appropriate.  Total time for H and P, chart review, counseling, reviewing smi device(s) and generating customized AVS unique to this office visit / same day charting > 60 min

## 2021-03-26 NOTE — Progress Notes (Signed)
Destiny Petty, female    DOB: [birth year],  MRN: [id number]   Brief patient profile:  37 yowf active smoker with childhood rhinitis esp late winter early never eval or need for inhalers but around 2016 or so developed pattern of bronchitis  requiring sev steroid  shots/ abx  Up to 7-8 x a year  with occ inhalers in between but pattern evolved to needing daily rx x late March 2022 assoc with refractory rhinitis [place] d/c so referred to pulmonary clinic in [place]  03/26/2021 by Doristine Church PA at [facility].     Lowest wt = 180 as of 2018       History of Present Illness  03/26/2021  Pulmonary/ 1st office eval/ [facility] / [place] [facility]  Chief Complaint  Patient presents with  . Pulmonary Consult    Referred by Doristine Church, PA.  Pt c/o SOB and "continuous bronchitis for 4 years straight". She states that she gets out of breath just walking across the room. Her cough is prod with clear sputum.She also c/o wheezing.  She states has hx of recurrent PNA. She has been treated with steroids in the past with some relief, but has been on pred recently with no improvement.  Dyspnea: walking for 20 min  / heavy housework  Was doing treadmilll as of 2 months prior to OV  But none since, that's her goal  Cough: clear mucus, worse esp at hs with gagging / sense of pnds on otc ppi only  Sleep: flat bed with lots of pillows  SABA use: last used 17 h prior to OV    No obvious other pattern sin day to day or daytime variability or assoc  purulent sputum or mucus plugs or hemoptysis or cp or chest tightness, subjective wheeze or overt sinus or hb symptoms.     Also denies any obvious fluctuation of symptoms with weather or environmental changes or other aggravating or alleviating factors except as outlined above   No unusual exposure hx or h/o childhood pna/ asthma or knowledge of premature birth.  Current Allergies, Complete Past Medical History, Past Surgical History, Family History, and  Social History were reviewed in [employer] record.  ROS  The following are not active complaints unless bolded Hoarseness, sore throat, dysphagia, dental problems, itching, sneezing,  nasal congestion or discharge of excess mucus or purulent secretions, ear ache,   fever, chills, sweats, unintended wt loss or wt gain, classically pleuritic or exertional cp,  orthopnea pnd or arm/hand swelling  or leg swelling, presyncope, palpitations, abdominal pain, anorexia, nausea, vomiting, diarrhea  or change in bowel habits or change in bladder habits, change in stools or change in urine, dysuria, hematuria,  rash, arthralgias, visual complaints, headache, numbness, weakness or ataxia or problems with walking or coordination,  change in mood or  memory.           Past Medical History:  Diagnosis Date  . Allergy    SEASONAL  . 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 Anxiety   . Arthritis 12/04/2016   Phreesia 05/23/2020  . Asthma   . Back pain with right-sided sciatica 06/18/2016  . Bipolar disorder (HCC) 01/15/2016   Overview:  Tried Depakote  . Body mass index 36.0-36.9, adult 04/10/2016  . Body mass index 37.0-37.9, adult 03/13/2016  . Depression   . Depressive disorder, not elsewhere classified 03/27/2007  . Dysmenorrhea 04/10/2016  . Family history of adverse reaction to anesthesia    PONV  . GERD (gastroesophageal  reflux disease)   . Hemorrhoids 02/10/2015  . Hiatal hernia   . Hyperlipidemia 04/09/2011  . Hypertension    no meds  . 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 Laceration of blood vessel of right little finger 08/23/2020  . Laceration of finger of left hand without damage to nail    left small finger  . Menorrhagia with regular cycle 04/10/2016  . Migraine 01/15/2016   Overview:  Overview:  IMO Load 2016 R1.3  . Muscle spasm of back 03/13/2016  . Numbness and tingling in left hand 08/19/2016  . Obesity   . Ovarian cyst 12/18/2016  . Perianal wart 02/10/2015  .  Stress 02/14/2016  . Weight loss counseling, encounter for 03/13/2016    Outpatient Medications Prior to Visit  Medication Sig Dispense Refill  . albuterol (VENTOLIN HFA) 108 (90 Base) MCG/ACT inhaler Inhale 1-2 puffs into the lungs every 6 (six) hours as needed for wheezing or shortness of breath. 18 g 1  . amLODipine (NORVASC) 5 MG tablet Take 1 tablet (5 mg total) by mouth daily. 30 tablet 1  . buPROPion (WELLBUTRIN XL) 300 MG 24 hr tablet TAKE 1 TABLET BY MOUTH ONCE A DAY. 90 tablet 0  . fluticasone (FLONASE) 50 MCG/ACT nasal spray Place 2 sprays into both nostrils daily. 16 g 2  . hydrochlorothiazide (MICROZIDE) 12.5 MG capsule TAKE ONE TABLET BY MOUTH ONCE DAILY. 30 capsule 2  . HYDROcodone-acetaminophen (NORCO/VICODIN) 5-325 MG tablet One tablet every six hours for pain.  Limit 7 days. 20 tablet 0  . omeprazole (PRILOSEC) 40 MG capsule Take 40 mg by mouth daily.    . predniSONE (STERAPRED UNI-PAK 21 TAB) 10 MG (21) TBPK tablet Take by mouth daily. Take 6 tabs by mouth daily  for 2 days, then 5 tabs for 2 days, then 4 tabs for 2 days, then 3 tabs for 2 days, 2 tabs for 2 days, then 1 tab by mouth daily for 2 days 42 tablet 0  . rizatriptan (MAXALT-MLT) 10 MG disintegrating tablet Take 1 tablet (10 mg total) by mouth as needed for migraine. May repeat in 2 hours if needed 10 tablet 0  . topiramate (TOPAMAX) 100 MG tablet TAKE (1) TABLET BY MOUTH ONCE DAILY. 30 tablet 2  . fluconazole (DIFLUCAN) 150 MG tablet Take 1 tablet PO now. Repeat dose in 72 hours. 2 tablet 0   No facility-administered medications prior to visit.     Objective:     BP (!) 142/90 (BP Location: Left Arm, Cuff Size: Normal)   Pulse 89   Temp 99 F (37.2 C) (Temporal)   Ht 5' (1.524 m)   Wt 211 lb (95.7 kg)   SpO2 98% Comment: on RA  BMI 41.21 kg/m   SpO2: 98 % (on RA)  amb somber wf nad prominent pseudowheeze    HEENT : pt wearing mask not removed for exam due to covid -19 concerns.    NECK :  without  JVD/Nodes/TM/ nl carotid upstrokes bilaterally   LUNGS: no acc muscle use,  Nl contour chest which is clear to A and P bilaterally without cough on insp or exp maneuvers   CV:  RRR  no s3 or murmur or increase in P2, and no edema   ABD:  Quite obese soft and nontender with nl inspiratory excursion in the supine position. No bruits or organomegaly appreciated, bowel sounds nl  MS:  Nl gait/ ext warm without deformities, calf tenderness, cyanosis or clubbing  No obvious joint restrictions   SKIN: warm and dry without lesions    NEURO:  alert, approp, nl sensorium with  no motor or cerebellar deficits apparent.     I personally reviewed images and agree with radiology impression as follows:  CXR:   PA and lateral 03/18/21 No active cardiopulmonary disease.     Assessment   DOE (dyspnea on exertion) Active smoker  - 03/26/2021  After extensive coaching inhaler device,  effectiveness =    75% with smi > try stiolto 2 puff bid / max gerd rx   DDX of  difficult airways management almost all start with A and  include Adherence, Ace Inhibitors, Acid Reflux, Active Sinus Disease, Alpha 1 Antitripsin deficiency, Anxiety masquerading as Airways dz,  ABPA,  Allergy(esp in young), Aspiration (esp in elderly), Adverse effects of meds,  Active smoking or vaping, A bunch of PE's (a small clot burden can't cause this syndrome unless there is already severe underlying pulm or vascular dz with poor reserve) plus two Bs  = Bronchiectasis and Beta blocker use..and one C= CHF   Adherence is always the initial "prime suspect" and is a multilayered concern that requires a "trust but verify" approach in every patient - starting with knowing how to use medications, especially inhalers, correctly, keeping up with refills and understanding the fundamental difference between maintenance and prns vs those medications only taken for a very short course and then stopped and not refilled.  - see hfa teaching  - return  in 2 weeks with all meds in hand using a trust but verify approach to confirm accurate Medication  Reconciliation The principal here is that until we are certain that the  patients are doing what we've asked, it makes no sense to ask them to do more.   Active smoking > see sep a/p  ? Acid (or non-acid) GERD > always difficult to exclude as up to 75% of pts in some series report no assoc GI/ Heartburn symptoms and she does have documented gerd by prior w/u > rec max (24h)  acid suppression and diet restrictions/ reviewed and instructions given in writing.   ? Active/acute sinus dz/ rhinitis has been seasonal/ watery and more c/w allegic rhinitis so rec 1st gen H1 blockers per guidelines  If tolerates   ? Anxiety/depression/ deconditioning > usually at the bottom of this list of usual suspects but   note already on psychotropics and may interfere with ability to quit smoking and adherence and also interpretation of response or lack thereof to symptom management which can be quite subjective.         Morbid obesity due to excess calories (HCC) Body mass index is 41.21 kg/m.  -  trending up since back on steroids Lab Results  Component Value Date   TSH 2.34 12/04/2016     Contributing to doe/gerd reviewed the need and the process to achieve and maintain neg calorie balance > defer f/u primary care including intermittently monitoring thyroid status      Cigarette smoker Counseled re importance of smoking cessation but did not meet time criteria for separate billing           Each maintenance medication was reviewed in detail including emphasizing most importantly the difference between maintenance and prns and under what circumstances the prns are to be triggered using an action plan format where appropriate.  Total time for H and P, chart review, counseling, reviewing smi device(s) and generating customized AVS unique to this  office visit / same day charting > 60 min            Sandrea HughsMichael Vonette Grosso, MD 03/26/2021

## 2021-03-26 NOTE — Patient Instructions (Addendum)
The key is to stop smoking completely before smoking completely stops you!  Plan A = Automatic = Always=    Stiolto 2 puffs each am   Work on inhaler technique:  relax and gently blow all the way out then take a nice smooth deep breath back in, triggering the inhaler at same time you start breathing in.  Hold for at least 5 seconds if you can.  Rinse and gargle with water when done    Plan B = Backup (to supplement plan A, not to replace it) Only use your albuterol inhaler (PROAIR) as a rescue medication to be used if you can't catch your breath by resting or doing a relaxed purse lip breathing pattern.  - The less you use it, the better it will work when you need it. - Ok to use the inhaler up to 2 puffs  every 4 hours if you must but call for appointment if use goes up over your usual need - Don't leave home without it !!  (think of it like the spare tire for your car)     For drainage / throat tickle try take CHLORPHENIRAMINE  4 mg  (Chlortab 4mg   at should be easiest to find in the green box)  take one every 4 hours as needed - available over the counter- may cause drowsiness so start with just a bedtime dose or two and see how you tolerate it before trying in daytime.  Pantoprazole (protonix) 40 mg   Take  30-60 min before first meal of the day and Pepcid (famotidine)  20 mg one after supper  until return to office - this is the best way to tell whether stomach acid is contributing to your problem.    GERD (REFLUX)  is an extremely common cause of respiratory symptoms just like yours , many times with no obvious heartburn at all.    It can be treated with medication, but also with lifestyle changes including elevation of the head of your bed (ideally with 6 -8inch blocks under the headboard of your bed),  Smoking cessation, avoidance of late meals, excessive alcohol, and avoid fatty foods, chocolate, peppermint, colas, red wine, and acidic juices such as orange juice.   NO MINT OR MENTHOL PRODUCTS SO NO COUGH DROPS  USE SUGARLESS CANDY INSTEAD (Jolley ranchers or Stover's or Life Savers) or even ice chips will also do - the key is to swallow to prevent all throat clearing. NO OIL BASED VITAMINS - use powdered substitutes.  Avoid fish oil when coughing.     Please schedule a follow up office visit in 2 weeks, sooner if needed  with all medications /inhalers/ solutions in hand so we can verify exactly what you are taking. This includes all medications from all doctors and over the counters

## 2021-03-26 NOTE — Assessment & Plan Note (Signed)
Body mass index is 41.21 kg/m.  -  trending up since back on steroids Lab Results  Component Value Date   TSH 2.34 12/04/2016     Contributing to doe/gerd reviewed the need and the process to achieve and maintain neg calorie balance > defer f/u primary care including intermittently monitoring thyroid status

## 2021-04-03 ENCOUNTER — Emergency Department (HOSPITAL_COMMUNITY)
Admission: EM | Admit: 2021-04-03 | Discharge: 2021-04-04 | Disposition: A | Discharge status: Home / Self Care | Payer: Medicaid Other | Attending: Emergency Medicine | Admitting: Emergency Medicine

## 2021-04-03 ENCOUNTER — Other Ambulatory Visit: Payer: Self-pay

## 2021-04-03 ENCOUNTER — Encounter (HOSPITAL_COMMUNITY): Payer: Self-pay | Admitting: Emergency Medicine

## 2021-04-03 DIAGNOSIS — J4530 Mild persistent asthma, uncomplicated: Secondary | ICD-10-CM | POA: Diagnosis not present

## 2021-04-03 DIAGNOSIS — Z79899 Other long term (current) drug therapy: Secondary | ICD-10-CM | POA: Diagnosis not present

## 2021-04-03 DIAGNOSIS — Z7952 Long term (current) use of systemic steroids: Secondary | ICD-10-CM | POA: Diagnosis not present

## 2021-04-03 DIAGNOSIS — F17210 Nicotine dependence, cigarettes, uncomplicated: Secondary | ICD-10-CM | POA: Diagnosis not present

## 2021-04-03 DIAGNOSIS — Z91040 Latex allergy status: Secondary | ICD-10-CM | POA: Diagnosis not present

## 2021-04-03 DIAGNOSIS — J029 Acute pharyngitis, unspecified: Secondary | ICD-10-CM | POA: Diagnosis present

## 2021-04-03 DIAGNOSIS — J020 Streptococcal pharyngitis: Secondary | ICD-10-CM | POA: Diagnosis not present

## 2021-04-03 LAB — GROUP A STREP BY PCR: Group A Strep by PCR: DETECTED — AB

## 2021-04-03 MED ORDER — PENICILLIN G BENZATHINE 1200000 UNIT/2ML IM SUSP
1.2000 10*6.[IU] | Freq: Once | INTRAMUSCULAR | Status: AC
  Administered 2021-04-04: 1.2 10*6.[IU] via INTRAMUSCULAR
  Filled 2021-04-03: qty 2

## 2021-04-03 MED ORDER — DEXAMETHASONE SODIUM PHOSPHATE 10 MG/ML IJ SOLN
10.0000 mg | Freq: Once | INTRAMUSCULAR | Status: AC
  Administered 2021-04-04: 10 mg via INTRAMUSCULAR
  Filled 2021-04-03: qty 1

## 2021-04-03 NOTE — ED Provider Notes (Signed)
MSE note.  Patient has a sore throat for a week.  Patient is in no acute distress.  Strep test will be done   Bethann Berkshire, MD 04/03/21 [id number]

## 2021-04-03 NOTE — ED Provider Notes (Signed)
[facility] EMERGENCY DEPARTMENT Provider Note   CSN: [id number] Arrival date & time: 04/03/21  2054     History Chief Complaint  Patient presents with  . Sore Throat    [facility] is a 40 y.o. female.  Patient presents to the emergency department for evaluation of sore throat.  Patient reports that she has been sick for more than a month.  She has been experiencing URI symptoms including cough, congestion, wheezing.  She has been to urgent care several times.  Patient was placed on Augmentin a month ago and then had a course of Zithromax.  Patient reports that her sore throat is progressively worsening over a couple of days.  It hurts to swallow.        Past Medical History:  Diagnosis Date  . Allergy    SEASONAL  . 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 Anxiety   . Arthritis 12/04/2016   Phreesia 05/23/2020  . Asthma   . Back pain with right-sided sciatica 06/18/2016  . Bipolar disorder (HCC) 01/15/2016   Overview:  Tried Depakote  . Body mass index 36.0-36.9, adult 04/10/2016  . Body mass index 37.0-37.9, adult 03/13/2016  . Depression   . Depressive disorder, not elsewhere classified 03/27/2007  . Dysmenorrhea 04/10/2016  . Family history of adverse reaction to anesthesia    PONV  . GERD (gastroesophageal reflux disease)   . Hemorrhoids 02/10/2015  . Hiatal hernia   . Hyperlipidemia 04/09/2011  . Hypertension    no meds  . 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 Laceration of blood vessel of right little finger 08/23/2020  . Laceration of finger of left hand without damage to nail    left small finger  . Menorrhagia with regular cycle 04/10/2016  . Migraine 01/15/2016   Overview:  Overview:  IMO Load 2016 R1.3  . Muscle spasm of back 03/13/2016  . Numbness and tingling in left hand 08/19/2016  . Obesity   . Ovarian cyst 12/18/2016  . Perianal wart 02/10/2015  . Stress 02/14/2016  . Weight loss counseling, encounter for 03/13/2016    Patient Active Problem  List   Diagnosis Date Noted  . DOE (dyspnea on exertion) 03/26/2021  . Cigarette smoker 03/26/2021  . Vagina, candidiasis 03/12/2021  . Headache 01/10/2021  . Environmental and seasonal allergies 07/28/2020  . Arthritis of left knee 06/01/2020  . Screening for diabetes mellitus 06/01/2020  . Visit for screening mammogram 06/01/2020  . 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 Back pain with right-sided sciatica 06/18/2016  . Morbid obesity due to excess calories (HCC) 03/13/2016  . Gallstones 01/29/2016  . Recurrent major depressive disorder, in full remission (HCC) 01/15/2016  . Nicotine addiction 07/10/2013  . GERD (gastroesophageal reflux disease) 07/10/2013  . Essential hypertension 07/08/2013  . Asthma, mild persistent 04/05/2013  . Orthopnea 12/18/2011  . Hyperlipidemia 04/09/2011    Past Surgical History:  Procedure Laterality Date  . ABLATION    . CESAREAN SECTION    . CESAREAN SECTION N/A    Phreesia 05/23/2020  . CHOLECYSTECTOMY    . COLONOSCOPY    . DILATATION AND CURETTAGE/HYSTEROSCOPY WITH MINERVA N/A 08/31/2019   Procedure: DILATATION AND CURETTAGE (no specimen) /HYSTEROSCOPY WITH MINERVA ENDOMETRIAL ABLATION;  Surgeon: Tilda Burrow, MD;  Location: [facility];  Service: Gynecology;  Laterality: N/A;  . ESOPHAGOGASTRODUODENOSCOPY ENDOSCOPY    . TENDON EXPLORATION Left 08/10/2020   Procedure: LEFT SMALL FINER EXPLORATION, REPAIR OF TENDON, ARTERY, NERVE;  Surgeon: Betha Loa, MD;  Location: [facility];  Service: Orthopedics;  Laterality: Left;  . TUBAL LIGATION       OB History    Gravida  2   Para  2   Term  2   Preterm      AB      Living  2     SAB      IAB      Ectopic      Multiple      Live Births  2           Family History  Problem Relation Age of Onset  . Diabetes Mother   . Kidney failure Mother   . Early death Mother 7       diabetes complications  . Cancer Father 40       colon cancer  .  Depression Sister   . Hypertension Sister   . Anxiety disorder Sister   . Other Sister        back problems  . ADD / ADHD Daughter   . Other Daughter        behavioral issues  . ODD Daughter   . Asthma Son   . Cancer Maternal Grandmother        breast  . Seizures Maternal Grandmother   . Lupus Other   . Breast cancer Maternal Aunt     Social History   Tobacco Use  . Smoking status: Current Every Day Smoker    Packs/day: 1.00    Years: 26.00    Pack years: 26.00    Types: Cigarettes  . Smokeless tobacco: Never Used  . Tobacco comment: trying to quit  Vaping Use  . Vaping Use: Never used  Substance Use Topics  . Alcohol use: Yes    Comment: occ  . Drug use: No    Home Medications Prior to Admission medications   Medication Sig Start Date End Date Taking? Authorizing Provider  amLODipine (NORVASC) 5 MG tablet Take 1 tablet (5 mg total) by mouth daily. 03/12/21   Heather Roberts, NP  buPROPion (WELLBUTRIN XL) 300 MG 24 hr tablet TAKE 1 TABLET BY MOUTH ONCE A DAY. 02/26/21   Heather Roberts, NP  famotidine (PEPCID) 20 MG tablet One after supper 03/26/21   Nyoka Cowden, MD  fluticasone (FLONASE) 50 MCG/ACT nasal spray INSTILL 2 SPRAYS INTO BOTH NOSTRILS DAILY. 03/26/21   Freddy Finner, NP  hydrochlorothiazide (MICROZIDE) 12.5 MG capsule TAKE ONE TABLET BY MOUTH ONCE DAILY. 02/14/21   Heather Roberts, NP  HYDROcodone-acetaminophen (NORCO/VICODIN) 5-325 MG tablet One tablet every six hours for pain.  Limit 7 days. 01/18/21   Darreld Mclean, MD  pantoprazole (PROTONIX) 40 MG tablet Take 1 tablet (40 mg total) by mouth daily. Take 30-60 min before first meal of the day 03/26/21   Nyoka Cowden, MD  predniSONE (STERAPRED UNI-PAK 21 TAB) 10 MG (21) TBPK tablet Take by mouth daily. Take 6 tabs by mouth daily  for 2 days, then 5 tabs for 2 days, then 4 tabs for 2 days, then 3 tabs for 2 days, 2 tabs for 2 days, then 1 tab by mouth daily for 2 days 03/18/21   Jodell Cipro, PA-C  PROAIR  HFA 108 (90 Base) MCG/ACT inhaler INHALE 1 OR 2 PUFFS INTO THE LUNGS EVERY 6 HOURS AS NEEDED FOR WHEEZING OR SHORTNESS OF BREATH 03/26/21   Freddy Finner, NP  rizatriptan (MAXALT-MLT) 10 MG disintegrating  tablet Take 1 tablet (10 mg total) by mouth as needed for migraine. May repeat in 2 hours if needed 03/12/21   Heather RobertsGray, Joseph M, NP  Tiotropium Bromide-Olodaterol (STIOLTO RESPIMAT) 2.5-2.5 MCG/ACT AERS Inhale 2 puffs into the lungs daily. 03/26/21   Nyoka CowdenWert, Michael B, MD  topiramate (TOPAMAX) 100 MG tablet TAKE (1) TABLET BY MOUTH ONCE DAILY. 02/14/21   Heather RobertsGray, Joseph M, NP    Allergies    Bee venom, Latex, and Lisinopril  Review of Systems   Review of Systems  HENT: Positive for congestion and sore throat.   Respiratory: Positive for cough.   All other systems reviewed and are negative.   Physical Exam Updated Vital Signs BP 116/69   Pulse 88   Temp 98.5 F (36.9 C)   Resp 17   Ht 5' (1.524 m)   Wt 95.3 kg   SpO2 96%   BMI 41.01 kg/m   Physical Exam Vitals and nursing note reviewed.  Constitutional:      General: She is not in acute distress.    Appearance: Normal appearance. She is well-developed.  HENT:     Head: Normocephalic and atraumatic.     Right Ear: Hearing normal.     Left Ear: Hearing normal.     Nose: Nose normal.     Mouth/Throat:     Pharynx: Pharyngeal swelling and posterior oropharyngeal erythema present.  Eyes:     Conjunctiva/sclera: Conjunctivae normal.     Pupils: Pupils are equal, round, and reactive to light.  Cardiovascular:     Rate and Rhythm: Regular rhythm.     Heart sounds: S1 normal and S2 normal. No murmur heard. No friction rub. No gallop.   Pulmonary:     Effort: Pulmonary effort is normal. No respiratory distress.     Breath sounds: Normal breath sounds.  Chest:     Chest wall: No tenderness.  Abdominal:     General: Bowel sounds are normal.     Palpations: Abdomen is soft.     Tenderness: There is no abdominal tenderness. There is no  guarding or rebound. Negative signs include Murphy's sign and McBurney's sign.     Hernia: No hernia is present.  Musculoskeletal:        General: Normal range of motion.     Cervical back: Normal range of motion and neck supple.  Skin:    General: Skin is warm and dry.     Findings: No rash.  Neurological:     Mental Status: She is alert and oriented to person, place, and time.     GCS: GCS eye subscore is 4. GCS verbal subscore is 5. GCS motor subscore is 6.     Cranial Nerves: No cranial nerve deficit.     Sensory: No sensory deficit.     Coordination: Coordination normal.  Psychiatric:        Speech: Speech normal.        Behavior: Behavior normal.        Thought Content: Thought content normal.     ED Results / Procedures / Treatments   Labs (all labs ordered are listed, but only abnormal results are displayed) Labs Reviewed  GROUP A STREP BY PCR - Abnormal; Notable for the following components:      Result Value   Group A Strep by PCR DETECTED (*)    All other components within normal limits  GC/CHLAMYDIA PROBE AMP ([place]    EKG None  Radiology  No results found.  Procedures Procedures   Medications Ordered in ED Medications  penicillin g benzathine (BICILLIN LA) 1200000 UNIT/2ML injection 1.2 Million Units (has no administration in time range)  dexamethasone (DECADRON) injection 10 mg (has no administration in time range)    ED Course  I have reviewed the triage vital signs and the nursing notes.  Pertinent labs & imaging results that were available during my care of the patient were reviewed by me and considered in my medical decision making (see chart for details).    MDM Rules/Calculators/A&P                          Patient with edematous and erythematous posterior oropharynx without exudate or tonsillar enlargement.  No asymmetry or peritonsillar abscess.  Strep test is positive.  Unclear how she has strep throat having had  multiple antibiotics this month.  She could be a carrier, but oropharynx does look suspicious for strep and therefore will treat with Bicillin LA and Decadron. Will perform throat GC/Chlam testing. Follow-up with ENT.  Final Clinical Impression(s) / ED Diagnoses Final diagnoses:  Strep throat    Rx / DC Orders ED Discharge Orders    None       Gilda Crease, MD 04/03/21 [id number]

## 2021-04-03 NOTE — ED Triage Notes (Signed)
Pt c/o sore throat and pressure in her head and behind her ears x 1 week. Pt states that she has been on prednisone and amoxicillin x 1 month. States she thinks she has a sinus infection.

## 2021-04-05 ENCOUNTER — Telehealth: Payer: Self-pay | Admitting: *Deleted

## 2021-04-05 LAB — GC/CHLAMYDIA PROBE AMP (~~LOC~~) NOT AT ARMC
Chlamydia: NEGATIVE
Comment: NEGATIVE
Comment: NORMAL
Neisseria Gonorrhea: NEGATIVE

## 2021-04-05 NOTE — Telephone Encounter (Signed)
Transition Care Management Unsuccessful Follow-up Telephone Call  Date of discharge and from where:  04/04/2021 - [facility]  Attempts:  1st Attempt  Reason for unsuccessful TCM follow-up call:  Left voice message

## 2021-04-06 NOTE — Telephone Encounter (Signed)
Transition Care Management Follow-up Telephone Call  Date of discharge and from where: 04/04/2021 [facility]  How have you been since you were released from the hospital? "About the same"  Any questions or concerns? No  Items Reviewed:  Did the pt receive and understand the discharge instructions provided? Yes   Medications obtained and verified? Yes   Other? No   Any new allergies since your discharge? No   Dietary orders reviewed? No  Do you have support at home? Yes      Functional Questionnaire: (I = Independent and D = Dependent) ADLs: I  Bathing/Dressing- I  Meal Prep- I  Eating- I  Maintaining continence- I  Transferring/Ambulation- I  Managing Meds- I  Follow up appointments reviewed:   PCP Hospital f/u appt confirmed? No    Specialist Hospital f/u appt confirmed? No    Are transportation arrangements needed? No   If their condition worsens, is the pt aware to call PCP or go to the Emergency Dept.? Yes  Was the patient provided with contact information for the PCP's office or ED? Yes  Was to pt encouraged to call back with questions or concerns? Yes

## 2021-04-11 ENCOUNTER — Ambulatory Visit: Payer: Medicaid Other | Admitting: Nurse Practitioner

## 2021-04-17 ENCOUNTER — Encounter: Payer: Self-pay | Admitting: Orthopaedic Surgery

## 2021-04-17 ENCOUNTER — Ambulatory Visit: Payer: Medicaid Other | Admitting: Orthopaedic Surgery

## 2021-04-25 ENCOUNTER — Ambulatory Visit: Payer: Medicaid Other | Admitting: Internal Medicine

## 2021-05-09 ENCOUNTER — Other Ambulatory Visit: Payer: Self-pay

## 2021-05-09 ENCOUNTER — Ambulatory Visit: Payer: Medicaid Other | Admitting: Nurse Practitioner

## 2021-05-09 ENCOUNTER — Encounter: Payer: Self-pay | Admitting: Nurse Practitioner

## 2021-05-09 VITALS — BP 137/78 | HR 98 | Temp 98.3°F | Resp 20 | Ht 60.0 in | Wt 210.0 lb

## 2021-05-09 DIAGNOSIS — Z0001 Encounter for general adult medical examination with abnormal findings: Secondary | ICD-10-CM

## 2021-05-09 DIAGNOSIS — Z0000 Encounter for general adult medical examination without abnormal findings: Secondary | ICD-10-CM

## 2021-05-09 DIAGNOSIS — J4530 Mild persistent asthma, uncomplicated: Secondary | ICD-10-CM

## 2021-05-09 DIAGNOSIS — R519 Headache, unspecified: Secondary | ICD-10-CM | POA: Diagnosis not present

## 2021-05-09 DIAGNOSIS — I10 Essential (primary) hypertension: Secondary | ICD-10-CM

## 2021-05-09 DIAGNOSIS — G8929 Other chronic pain: Secondary | ICD-10-CM

## 2021-05-09 DIAGNOSIS — E6609 Other obesity due to excess calories: Secondary | ICD-10-CM | POA: Diagnosis not present

## 2021-05-09 DIAGNOSIS — Z6838 Body mass index (BMI) 38.0-38.9, adult: Secondary | ICD-10-CM

## 2021-05-09 MED ORDER — RIZATRIPTAN BENZOATE 10 MG PO TBDP: 10.0000 mg | ORAL_TABLET | ORAL | 0 refills | Status: DC | PRN

## 2021-05-09 MED ORDER — HYDROCHLOROTHIAZIDE 12.5 MG PO CAPS: ORAL_CAPSULE | ORAL | 2 refills | Status: DC

## 2021-05-09 MED ORDER — STIOLTO RESPIMAT 2.5-2.5 MCG/ACT IN AERS: 2.0000 | INHALATION_SPRAY | Freq: Every day | RESPIRATORY_TRACT | 0 refills | Status: DC

## 2021-05-09 MED ORDER — AMLODIPINE BESYLATE 5 MG PO TABS: 5.0000 mg | ORAL_TABLET | Freq: Every day | ORAL | 1 refills | Status: DC

## 2021-05-09 MED ORDER — TOPIRAMATE 100 MG PO TABS: ORAL_TABLET | ORAL | 2 refills | Status: DC

## 2021-05-09 NOTE — Assessment & Plan Note (Signed)
-  she states she has pulmonology appointment upcoming, but is out of stiolto, which was helping her -refilled stiolto

## 2021-05-09 NOTE — Assessment & Plan Note (Signed)
-  BP is well controlled -no adverse med effects since starting amlodipine -refilled amlodipine

## 2021-05-09 NOTE — Progress Notes (Signed)
Acute Office Visit  Subjective:    Patient ID: [facility], female    DOB: 1981-08-24, 40 y.o.   MRN: [id number]  Chief Complaint  Patient presents with  . Hypertension    Med refills    HPI Patient is in today for BP check. At her last visit, her BP was elevated, so she was started on amlodipine.  She was also started on maxalt for migraine. She states that she changed jobs and that has been a blessing.  She is having 2-3 migraine days per day, but maxalt is helping to abort them.  Past Medical History:  Diagnosis Date  . Allergy    SEASONAL  . 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 Anxiety   . Arthritis 12/04/2016   Phreesia 05/23/2020  . Asthma   . Back pain with right-sided sciatica 06/18/2016  . Bipolar disorder ([place]) 01/15/2016   Overview:  Tried Depakote  . Body mass index 36.0-36.9, adult 04/10/2016  . Body mass index 37.0-37.9, adult 03/13/2016  . Depression   . Depressive disorder, not elsewhere classified 03/27/2007  . Dysmenorrhea 04/10/2016  . Family history of adverse reaction to anesthesia    PONV  . GERD (gastroesophageal reflux disease)   . Hemorrhoids 02/10/2015  . Hiatal hernia   . Hyperlipidemia 04/09/2011  . Hypertension    no meds  . 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 Laceration of blood vessel of right little finger 08/23/2020  . Laceration of finger of left hand without damage to nail    left small finger  . Menorrhagia with regular cycle 04/10/2016  . Migraine 01/15/2016   Overview:  Overview:  IMO Load 2016 R1.3  . Muscle spasm of back 03/13/2016  . Numbness and tingling in left hand 08/19/2016  . Obesity   . Ovarian cyst 12/18/2016  . Perianal wart 02/10/2015  . Stress 02/14/2016  . Weight loss counseling, encounter for 03/13/2016    Past Surgical History:  Procedure Laterality Date  . ABLATION    . CESAREAN SECTION    . CESAREAN SECTION N/A    Phreesia 05/23/2020  . CHOLECYSTECTOMY    . COLONOSCOPY    . DILATATION AND  CURETTAGE/HYSTEROSCOPY WITH MINERVA N/A 08/31/2019   Procedure: DILATATION AND CURETTAGE (no specimen) /HYSTEROSCOPY WITH MINERVA ENDOMETRIAL ABLATION;  Surgeon: Jonnie Kind, MD;  Location: [facility];  Service: Gynecology;  Laterality: N/A;  . ESOPHAGOGASTRODUODENOSCOPY ENDOSCOPY    . TENDON EXPLORATION Left 08/10/2020   Procedure: LEFT SMALL FINER EXPLORATION, REPAIR OF TENDON, ARTERY, NERVE;  Surgeon: Leanora Cover, MD;  Location: [facility];  Service: Orthopedics;  Laterality: Left;  . TUBAL LIGATION      Family History  Problem Relation Age of Onset  . Diabetes Mother   . Kidney failure Mother   . Early death Mother 53       diabetes complications  . Cancer Father 81       colon cancer  . Depression Sister   . Hypertension Sister   . Anxiety disorder Sister   . Other Sister        back problems  . ADD / ADHD Daughter   . Other Daughter        behavioral issues  . ODD Daughter   . Asthma Son   . Cancer Maternal Grandmother        breast  . Seizures Maternal Grandmother   . Lupus Other   . Breast cancer Maternal Aunt  Social History   Socioeconomic History  . Marital status: Legally Separated    Spouse name: Not on file  . Number of children: 2  . Years of education: 34  . Highest education level: Not on file  Occupational History  . Not on file  Tobacco Use  . Smoking status: Current Every Day Smoker    Packs/day: 1.00    Years: 26.00    Pack years: 26.00    Types: Cigarettes  . Smokeless tobacco: Never Used  . Tobacco comment: trying to quit  Vaping Use  . Vaping Use: Never used  Substance and Sexual Activity  . Alcohol use: Yes    Comment: occ  . Drug use: No  . Sexual activity: Yes    Birth control/protection: Surgical    Comment: tubal  Other Topics Concern  . 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 Difficulty of Paying Living Expenses: Not hard at all  Food Insecurity: No Food Insecurity  . Worried About Charity fundraiser in the Last Year: Never true  . Ran Out of Food in the Last Year: Never true  Transportation Needs: No Transportation Needs  . Lack of Transportation (Medical): No  . Lack of Transportation (Non-Medical): No  Physical Activity: Insufficiently Active  . Days of Exercise per Week: 2 days  . Minutes of Exercise per Session: 30 min  Stress: No Stress Concern Present  . Feeling of Stress : Not at all  Social Connections: Moderately Isolated  . Frequency of Communication with Friends and Family: More than three times a week  . Frequency of Social Gatherings with Friends and Family: More than three times a week  . Attends Religious Services: More than 4 times per year  . Active Member of Clubs or Organizations: No  . Attends Archivist Meetings: Never  . Marital Status: Separated  Intimate Partner Violence: Not At Risk  . Fear of Current or Ex-Partner: No  . Emotionally Abused: No  . Physically Abused: No  . Sexually Abused: No    Outpatient Medications Prior to Visit  Medication Sig Dispense Refill  . buPROPion (WELLBUTRIN XL) 300 MG 24 hr tablet TAKE 1 TABLET BY MOUTH ONCE A DAY. 90 tablet 0  . famotidine (PEPCID) 20 MG tablet One after supper 30 tablet 11  . fluticasone (FLONASE) 50 MCG/ACT nasal spray INSTILL 2 SPRAYS INTO BOTH NOSTRILS DAILY. 16 g 0  . pantoprazole (PROTONIX) 40 MG tablet Take 1 tablet (40 mg total) by mouth daily. Take 30-60 min before first meal of the day 30 tablet 2  . PROAIR HFA 108 (90 Base)  MCG/ACT inhaler INHALE 1 OR 2 PUFFS INTO THE LUNGS EVERY 6 HOURS AS NEEDED FOR WHEEZING OR SHORTNESS OF BREATH 8.5 g 0  . amLODipine (NORVASC)  5 MG tablet Take 1 tablet (5 mg total) by mouth daily. 30 tablet 1  . hydrochlorothiazide (MICROZIDE) 12.5 MG capsule TAKE ONE TABLET BY MOUTH ONCE DAILY. 30 capsule 2  . HYDROcodone-acetaminophen (NORCO/VICODIN) 5-325 MG tablet One tablet every six hours for pain.  Limit 7 days. 20 tablet 0  . predniSONE (STERAPRED UNI-PAK 21 TAB) 10 MG (21) TBPK tablet Take by mouth daily. Take 6 tabs by mouth daily  for 2 days, then 5 tabs for 2 days, then 4 tabs for 2 days, then 3 tabs for 2 days, 2 tabs for 2 days, then 1 tab by mouth daily for 2 days 42 tablet 0  . rizatriptan (MAXALT-MLT) 10 MG disintegrating tablet Take 1 tablet (10 mg total) by mouth as needed for migraine. May repeat in 2 hours if needed 10 tablet 0  . Tiotropium Bromide-Olodaterol (STIOLTO RESPIMAT) 2.5-2.5 MCG/ACT AERS Inhale 2 puffs into the lungs daily. 4 g 0  . topiramate (TOPAMAX) 100 MG tablet TAKE (1) TABLET BY MOUTH ONCE DAILY. 30 tablet 2   No facility-administered medications prior to visit.    Allergies  Allergen Reactions  . Bee Venom Shortness Of Breath  . Latex Itching  . Lisinopril Cough    coughing    Review of Systems  Constitutional: Negative.   Respiratory: Negative.   Cardiovascular: Negative.   Neurological: Positive for headaches.       Improved with maxalt       Objective:    Physical Exam Constitutional:      Appearance: Normal appearance. She is obese.  Cardiovascular:     Rate and Rhythm: Normal rate and regular rhythm.     Pulses: Normal pulses.     Heart sounds: Normal heart sounds.  Pulmonary:     Effort: Pulmonary effort is normal.     Breath sounds: Normal breath sounds.  Neurological:     Mental Status: She is alert.  Psychiatric:        Mood and Affect: Mood normal.        Behavior: Behavior normal.        Thought Content: Thought  content normal.        Judgment: Judgment normal.     BP 137/78   Pulse 98   Temp 98.3 F (36.8 C)   Resp 20   Ht 5' (1.524 m)   Wt 210 lb (95.3 kg)   SpO2 95%   BMI 41.01 kg/m  Wt Readings from Last 3 Encounters:  05/09/21 210 lb (95.3 kg)  04/03/21 210 lb (95.3 kg)  03/26/21 211 lb (95.7 kg)    Health Maintenance Due  Topic Date Due  . COVID-19 Vaccine (1) Never done  . Hepatitis C Screening  Never done    There are no preventive care reminders to display for this patient.   Lab Results  Component Value Date   TSH 2.34 12/04/2016   Lab Results  Component Value Date   WBC 9.6 06/01/2020   HGB 15.8 (H) 06/01/2020   HCT 46.8 (H) 06/01/2020   MCV 94.4 06/01/2020   PLT 248 06/01/2020   Lab Results  Component Value Date   NA 139 06/01/2020   K 4.6 06/01/2020   CO2 28 06/01/2020   GLUCOSE 79 06/01/2020   BUN 9 06/01/2020   CREATININE 0.70 06/01/2020   BILITOT 0.5 06/01/2020   ALKPHOS 78 09/10/2019   AST 14 06/01/2020   ALT 12 06/01/2020   PROT 6.7 06/01/2020   ALBUMIN 4.1  09/10/2019   CALCIUM 9.6 06/01/2020   ANIONGAP 6 09/10/2019   Lab Results  Component Value Date   CHOL 217 (H) 06/01/2020   Lab Results  Component Value Date   HDL 45 (L) 06/01/2020   Lab Results  Component Value Date   LDLCALC 140 (H) 06/01/2020   Lab Results  Component Value Date   TRIG 186 (H) 06/01/2020   Lab Results  Component Value Date   CHOLHDL 4.8 06/01/2020   Lab Results  Component Value Date   HGBA1C 5.2 06/01/2020   HGBA1C 5.2 06/01/2020   HGBA1C 5.2 (A) 06/01/2020   HGBA1C 5.2 06/01/2020       Assessment & Plan:   Problem List Items Addressed This Visit      Cardiovascular and Mediastinum   Essential hypertension - Primary    -BP is well controlled -no adverse med effects since starting amlodipine -refilled amlodipine      Relevant Medications   amLODipine (NORVASC) 5 MG tablet   hydrochlorothiazide (MICROZIDE) 12.5 MG capsule      Respiratory   Asthma, mild persistent    -she states she has pulmonology appointment upcoming, but is out of stiolto, which was helping her -refilled stiolto       Relevant Medications   Tiotropium Bromide-Olodaterol (STIOLTO RESPIMAT) 2.5-2.5 MCG/ACT AERS     Other   Headache    -refilled maxalt for migraine -2-3 migraine days per week; resolved with maxalt -no need for CGRP at this time      Relevant Medications   amLODipine (NORVASC) 5 MG tablet   rizatriptan (MAXALT-MLT) 10 MG disintegrating tablet   topiramate (TOPAMAX) 100 MG tablet    Other Visit Diagnoses    Class 2 obesity due to excess calories without serious comorbidity with body mass index (BMI) of 38.0 to 38.9 in adult       Relevant Medications   topiramate (TOPAMAX) 100 MG tablet   Annual physical exam       Relevant Orders   CBC with Differential/Platelet   CMP14+EGFR   Lipid Panel With LDL/HDL Ratio   TSH       Meds ordered this encounter  Medications  . amLODipine (NORVASC) 5 MG tablet    Sig: Take 1 tablet (5 mg total) by mouth daily.    Dispense:  30 tablet    Refill:  1  . hydrochlorothiazide (MICROZIDE) 12.5 MG capsule    Sig: TAKE ONE TABLET BY MOUTH ONCE DAILY.    Dispense:  30 capsule    Refill:  2  . rizatriptan (MAXALT-MLT) 10 MG disintegrating tablet    Sig: Take 1 tablet (10 mg total) by mouth as needed for migraine. May repeat in 2 hours if needed    Dispense:  10 tablet    Refill:  0    Increased dosage today (03/12/21)  . Tiotropium Bromide-Olodaterol (STIOLTO RESPIMAT) 2.5-2.5 MCG/ACT AERS    Sig: Inhale 2 puffs into the lungs daily.    Dispense:  4 g    Refill:  0    Order Specific Question:   Lot Number?    Answer:   [id number]    Order Specific Question:   Expiration Date?    Answer:   08/07/2022    Order Specific Question:   Quantity    Answer:   1  . topiramate (TOPAMAX) 100 MG tablet    Sig: TAKE (1) TABLET BY MOUTH ONCE DAILY.    Dispense:  30 tablet    Refill:  [place], NP

## 2021-05-09 NOTE — Patient Instructions (Signed)
Please have fasting labs drawn 2-3 days prior to your appointment so we can discuss the results during your office visit.  

## 2021-05-09 NOTE — Assessment & Plan Note (Signed)
-  refilled maxalt for migraine -2-3 migraine days per week; resolved with maxalt -no need for CGRP at this time

## 2021-05-15 ENCOUNTER — Other Ambulatory Visit: Payer: Self-pay | Admitting: Family Medicine

## 2021-05-15 DIAGNOSIS — J3089 Other allergic rhinitis: Secondary | ICD-10-CM

## 2021-05-15 DIAGNOSIS — J4530 Mild persistent asthma, uncomplicated: Secondary | ICD-10-CM

## 2021-05-30 ENCOUNTER — Other Ambulatory Visit: Payer: Self-pay

## 2021-05-30 ENCOUNTER — Ambulatory Visit: Payer: Medicaid Other | Admitting: Internal Medicine

## 2021-05-30 ENCOUNTER — Encounter: Payer: Self-pay | Admitting: Internal Medicine

## 2021-05-30 VITALS — BP 124/90 | HR 86 | Temp 98.6°F | Ht 60.0 in | Wt 213.0 lb

## 2021-05-30 DIAGNOSIS — R0609 Other forms of dyspnea: Secondary | ICD-10-CM

## 2021-05-30 DIAGNOSIS — F17210 Nicotine dependence, cigarettes, uncomplicated: Secondary | ICD-10-CM

## 2021-05-30 DIAGNOSIS — R0600 Dyspnea, unspecified: Secondary | ICD-10-CM

## 2021-05-30 MED ORDER — STIOLTO RESPIMAT 2.5-2.5 MCG/ACT IN AERS: 2.0000 | INHALATION_SPRAY | Freq: Every day | RESPIRATORY_TRACT | 11 refills | Status: DC

## 2021-05-30 MED ORDER — FLUCONAZOLE 100 MG PO TABS
100.0000 mg | ORAL_TABLET | Freq: Every day | ORAL | 0 refills | Status: DC
Start: 2021-05-30 — End: 2021-07-02

## 2021-05-30 MED ORDER — AMOXICILLIN-POT CLAVULANATE 875-125 MG PO TABS: 1.0000 | ORAL_TABLET | Freq: Two times a day (BID) | ORAL | 0 refills | Status: AC

## 2021-05-30 MED ORDER — STIOLTO RESPIMAT 2.5-2.5 MCG/ACT IN AERS: 2.0000 | INHALATION_SPRAY | Freq: Every day | RESPIRATORY_TRACT | 0 refills | Status: DC

## 2021-05-30 NOTE — Progress Notes (Signed)
Destiny Petty, Destiny Petty, female    DOB: 1981-12-07,  MRN: [id number]   Brief patient profile:  43 yowf active smoker with childhood rhinitis esp late winter early never eval or need for inhalers but around 2016 or so developed pattern of bronchitis  requiring sev steroid  shots/ abx  Up to 7-8 x a year  with occ inhalers in between but pattern evolved to needing daily rx x late March 2022 assoc with refractory rhinitis [place] d/c so referred to pulmonary clinic in [place]  03/26/2021 by Doristine Church PA at [facility]     Lowest wt = 180 as of 2018       History of Present Illness  03/26/2021  Pulmonary/ 1st office eval/ Eva Griffo / [place] [facility]  Chief Complaint  Patient presents with   Pulmonary Consult    Referred by Doristine Church, PA.  Pt c/o SOB and "continuous bronchitis for 4 years straight". She states that she gets out of breath just walking across the room. Her cough is prod with clear sputum.She also c/o wheezing.  She states has hx of recurrent PNA. She has been treated with steroids in the past with some relief, but has been on pred recently with no improvement.  Dyspnea: walking for 20 min  / heavy housework  Was doing treadmilll as of 2 months prior to OV  But none since, that's her goal  Cough: clear mucus, worse esp at hs with gagging / sense of pnds on otc ppi only  Sleep: flat bed with lots of pillows  SABA use: last used 17 h prior to OV   Rec The key is to stop smoking completely before smoking completely stops you! Plan A = Automatic = Always=    Stiolto 2 puffs each am  Work on inhaler technique:  Plan B = Backup (to supplement plan A, not to replace it) Only use your albuterol inhaler (PROAIR) as a rescue medication For drainage / throat tickle try take CHLORPHENIRAMINE  4 mg  Pantoprazole (protonix) 40 mg   Take  30-60 min before first meal of the day and Pepcid (famotidine)  20 mg one after supper  until return to office - this is the best way to tell whether  stomach acid is contributing to your problem.  GERD diet  Please schedule a follow up office visit in 2 weeks, sooner if needed  with all medications /inhalers/ solutions in hand so we can verify exactly what you are taking. This includes all medications from all doctors and over the counters      05/30/2021  f/u ov/[place] office/Melva Faux re:  doe/ mo  worse since ran out of stiolto / still smoking  Chief Complaint  Patient presents with   Follow-up    Breathing has improved some, worse again since she ran out of Stiolto about a month ago. She states right after last ov she tested pos for strep throat. She is still coughing and wheezing "I have a summer cold"- cough with prod with green sputum x 2 wks. She is using her albuterol inhaler at least once per day.    Dyspnea:  worse off stiolto Cough: green phlegm is back since finished abx for "strep" / assoc with purulent rhinitis Sleeping: sitting up 45 degrees x years due to breathing  SABA use: once daily  02: none  Covid status: none vaccinations/ omicron  Lung cancer screening: n/a    No obvious patterns in  day to day or daytime variability  or assoc   mucus plugs or hemoptysis or cp or chest tightness, subjective wheeze or overt sinus or hb symptoms.     Also denies any obvious fluctuation of symptoms with weather or environmental changes or other aggravating or alleviating factors except as outlined above   No unusual exposure hx or h/o childhood pna/ asthma or knowledge of premature birth.  Current Allergies, Complete Past Medical History, Past Surgical History, Family History, and Social History were reviewed in [employer] record.  ROS  The following are not active complaints unless bolded Hoarseness, sore throat, dysphagia, dental problems, itching, sneezing,  nasal congestion or discharge of excess mucus or purulent secretions, ear ache,   fever, chills, sweats, unintended wt loss or wt gain, classically  pleuritic or exertional cp,  orthopnea pnd or arm/hand swelling  or leg swelling, presyncope, palpitations, abdominal pain, anorexia, nausea, vomiting, diarrhea  or change in bowel habits or change in bladder habits, change in stools or change in urine, dysuria, hematuria,  rash, arthralgias, visual complaints, headache, numbness, weakness or ataxia or problems with walking or coordination,  change in mood or  memory.        Current Meds  Medication Sig   amLODipine (NORVASC) 5 MG tablet Take 1 tablet (5 mg total) by mouth daily.   buPROPion (WELLBUTRIN XL) 300 MG 24 hr tablet TAKE 1 TABLET BY MOUTH ONCE A DAY.   chlorpheniramine (CHLOR-TRIMETON) 4 MG tablet Take 4 mg by mouth every 4 (four) hours as needed for allergies.   famotidine (PEPCID) 20 MG tablet One after supper   fluticasone (FLONASE) 50 MCG/ACT nasal spray INSTILL 2 SPRAYS INTO BOTH NOSTRILS DAILY.   hydrochlorothiazide (MICROZIDE) 12.5 MG capsule TAKE ONE TABLET BY MOUTH ONCE DAILY.   pantoprazole (PROTONIX) 40 MG tablet Take 1 tablet (40 mg total) by mouth daily. Take 30-60 min before first meal of the day   PROAIR HFA 108 (90 Base) MCG/ACT inhaler INHALE 1 OR 2 PUFFS INTO THE LUNGS EVERY 6 HOURS AS NEEDED FOR WHEEZING OR SHORTNESS OF BREATH   rizatriptan (MAXALT-MLT) 10 MG disintegrating tablet Take 1 tablet (10 mg total) by mouth as needed for migraine. May repeat in 2 hours if needed        topiramate (TOPAMAX) 100 MG tablet TAKE (1) TABLET BY MOUTH ONCE DAILY.                    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   Anxiety    Arthritis 12/04/2016   Phreesia 05/23/2020   Asthma    Back pain with right-sided sciatica 06/18/2016   Bipolar disorder (HCC) 01/15/2016   Overview:  Tried Depakote   Body mass index 36.0-36.9, adult 04/10/2016   Body mass index 37.0-37.9, adult 03/13/2016   Depression    Depressive disorder, not elsewhere classified 03/27/2007   Dysmenorrhea  04/10/2016   Family history of adverse reaction to anesthesia    PONV   GERD (gastroesophageal reflux disease)    Hemorrhoids 02/10/2015   Hiatal hernia    Hyperlipidemia 04/09/2011   Hypertension    no meds   Injury of digital nerve of right little finger 08/23/2020   Laceration of blood vessel of right little finger 08/23/2020   Laceration of finger of left hand without damage to nail    left small finger   Menorrhagia with regular cycle 04/10/2016   Migraine 01/15/2016  Overview:  Overview:  IMO Load 2016 R1.3   Muscle spasm of back 03/13/2016   Numbness and tingling in left hand 08/19/2016   Obesity    Ovarian cyst 12/18/2016   Perianal wart 02/10/2015   Stress 02/14/2016   Weight loss counseling, encounter for 03/13/2016        Objective:      Wt Readings from Last 3 Encounters:  05/30/21 213 lb (96.6 kg)  05/09/21 210 lb (95.3 kg)  04/03/21 210 lb (95.3 kg)      Vital signs reviewed  05/30/2021  - Note at rest 02 sats  97% on RA   General appearance:    amb wf nad/ mild pseudowheeze  HEENT : pt wearing mask not removed for exam due to covid - 19 concerns.   NECK :  without JVD/Nodes/TM/ nl carotid upstrokes bilaterally   LUNGS: no acc muscle use,  Min barrel  contour chest wall with bilateral distant  bs s   wheeze vs transmitted upper airways noises and  without cough on insp or exp maneuvers and min  Hyperresonant  to  percussion bilaterally     CV:  RRR  no s3 or murmur or increase in P2, and no edema   ABD:  soft and nontender with pos end  insp Hoover's  in the supine position. No bruits or organomegaly appreciated, bowel sounds nl  MS:   Nl gait/  ext warm without deformities, calf tenderness, cyanosis or clubbing No obvious joint restrictions   SKIN: warm and dry without lesions    NEURO:  alert, approp, nl sensorium with  no motor or cerebellar deficits apparent.            Assessment

## 2021-05-30 NOTE — Patient Instructions (Addendum)
Plan A = Automatic = Always=    Stiolto   Work on inhaler technique:  relax and gently blow all the way out then take a nice smooth full deep breath back in, triggering the inhaler at same time you start breathing in.  Hold for up to 5 seconds if you can.  . Rinse and gargle with water when done.  If mouth or throat bother you at all,  try brushing teeth/gums/tongue with arm and hammer toothpaste/ make a slurry and gargle and spit out.      Plan B = Backup (to supplement plan A, not to replace it) Only use your albuterol inhaler as a rescue medication to be used if you can't catch your breath by resting or doing a relaxed purse lip breathing pattern.  - The less you use it, the better it will work when you need it. - Ok to use the inhaler up to 2 puffs  every 4 hours if you must but call for appointment if use goes up over your usual need - Don't leave home without it !!  (think of it like the spare tire for your car)     Augmentin 875 mg take one pill twice daily  X 21 days - take at breakfast and supper with large glass of water.  It would help reduce the usual side effects (diarrhea and yeast infections) if you ate cultured yogurt at lunch.   Continue protonix 40 Take 30-60 min before first meal of the day and Pepcid after supper  The key is to stop smoking completely before smoking completely stops you!   Please schedule a follow up office visit in 6 weeks, call sooner if needed with pfts on return

## 2021-05-31 ENCOUNTER — Encounter: Payer: Self-pay | Admitting: Internal Medicine

## 2021-05-31 NOTE — Assessment & Plan Note (Signed)
Counseled re importance of smoking cessation but did not meet time criteria for separate billing            Each maintenance medication was reviewed in detail including emphasizing most importantly the difference between maintenance and prns and under what circumstances the prns are to be triggered using an action plan format where appropriate.  Total time for H and P, chart review, counseling, reviewing hfa and smi  device(s) and generating customized AVS unique to this office visit / same day charting = 31 min

## 2021-05-31 NOTE — Assessment & Plan Note (Signed)
Body mass index is 41.6 kg/m.  -  Trending up  Lab Results  Component Value Date   TSH 2.34 12/04/2016     Contributing to gerd risk/ doe/reviewed the need and the process to achieve and maintain neg calorie balance > defer f/u primary care including intermittently monitoring thyroid status

## 2021-05-31 NOTE — Assessment & Plan Note (Signed)
Active smoker  - 03/26/2021    try stiolto 2 puff bid / max gerd rx > improved while on stiolto, worse off  - 05/30/2021  After extensive coaching inhaler device,  effectiveness =    90% with smi > resume stiolto  And rx for sinsusitis/ gerd then ov in 6 weeks with pfts  She refuses to consider taking more prednisone and I suspect most of her symptoms are not really AB but more pseudowheeze from rinitis/ sinusitis and gerd so for now leave off all forms of streroids and just try to rx the underlying triggers and maintain on stiolto with f/u pfts   Re saba: I spent extra time with pt today reviewing appropriate use of albuterol for prn use on exertion with the following points: 1) saba is for relief of sob that does not improve by walking a slower pace or resting but rather if the pt does not improve after trying this first. 2) If the pt is convinced, as many are, that saba helps recover from activity faster then it's easy to tell if this is the case by re-challenging : ie stop, take the inhaler, then p 5 minutes try the exact same activity (intensity of workload) that just caused the symptoms and see if they are substantially diminished or not after saba 3) if there is an activity that reproducibly causes the symptoms, try the saba 15 min before the activity on alternate days   If in fact the saba really does help, then fine to continue to use it prn but advised may need to look closer at the maintenance regimen being used to achieve better control of airways disease with exertion.

## 2021-06-01 ENCOUNTER — Other Ambulatory Visit: Payer: Self-pay

## 2021-06-01 ENCOUNTER — Encounter (HOSPITAL_COMMUNITY): Payer: Self-pay | Admitting: Emergency Medicine

## 2021-06-01 DIAGNOSIS — R202 Paresthesia of skin: Secondary | ICD-10-CM | POA: Insufficient documentation

## 2021-06-01 DIAGNOSIS — R509 Fever, unspecified: Secondary | ICD-10-CM | POA: Insufficient documentation

## 2021-06-01 DIAGNOSIS — Z91040 Latex allergy status: Secondary | ICD-10-CM | POA: Diagnosis not present

## 2021-06-01 DIAGNOSIS — I10 Essential (primary) hypertension: Secondary | ICD-10-CM | POA: Insufficient documentation

## 2021-06-01 DIAGNOSIS — J45909 Unspecified asthma, uncomplicated: Secondary | ICD-10-CM | POA: Diagnosis not present

## 2021-06-01 DIAGNOSIS — S299XXA Unspecified injury of thorax, initial encounter: Secondary | ICD-10-CM | POA: Insufficient documentation

## 2021-06-01 DIAGNOSIS — Z79899 Other long term (current) drug therapy: Secondary | ICD-10-CM | POA: Insufficient documentation

## 2021-06-01 DIAGNOSIS — Z7951 Long term (current) use of inhaled steroids: Secondary | ICD-10-CM | POA: Insufficient documentation

## 2021-06-01 DIAGNOSIS — R109 Unspecified abdominal pain: Secondary | ICD-10-CM | POA: Insufficient documentation

## 2021-06-01 DIAGNOSIS — F17210 Nicotine dependence, cigarettes, uncomplicated: Secondary | ICD-10-CM | POA: Diagnosis not present

## 2021-06-01 DIAGNOSIS — R079 Chest pain, unspecified: Secondary | ICD-10-CM | POA: Diagnosis not present
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
## 2021-06-01 NOTE — ED Notes (Signed)
Pt also c/o numbness to fingers on R hand and tenderness to R lower abdomen.

## 2021-06-02 ENCOUNTER — Emergency Department (HOSPITAL_COMMUNITY)
Admission: EM | Admit: 2021-06-02 | Discharge: 2021-06-02 | Disposition: A | Discharge status: Home / Self Care | Payer: Medicaid Other | Attending: Emergency Medicine | Admitting: Emergency Medicine

## 2021-06-02 ENCOUNTER — Emergency Department (HOSPITAL_COMMUNITY): Payer: Medicaid Other

## 2021-06-02 DIAGNOSIS — S298XXA Other specified injuries of thorax, initial encounter: Secondary | ICD-10-CM

## 2021-06-02 DIAGNOSIS — R079 Chest pain, unspecified: Secondary | ICD-10-CM | POA: Diagnosis not present

## 2021-06-02 MED ORDER — IBUPROFEN 400 MG PO TABS
400.0000 mg | ORAL_TABLET | Freq: Once | ORAL | Status: AC
  Administered 2021-06-02: 400 mg via ORAL
  Filled 2021-06-02: qty 1

## 2021-06-02 MED ORDER — HYDROCODONE-ACETAMINOPHEN 5-325 MG PO TABS
1.0000 | ORAL_TABLET | Freq: Once | ORAL | Status: AC
  Administered 2021-06-02: 1 via ORAL
  Filled 2021-06-02: qty 1
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
## 2021-06-02 NOTE — ED Notes (Addendum)
0230 Pt ambulated to restroom w/o assistance

## 2021-06-02 NOTE — ED Notes (Signed)
ED Provider at bedside. 

## 2021-06-04 ENCOUNTER — Telehealth: Payer: Self-pay

## 2021-06-04 NOTE — Telephone Encounter (Signed)
Transition Care Management Follow-up Telephone Call Date of discharge and from where: 06/02/2021 from [facility] How have you been since you were released from the hospital? Pt states that she is still in pain.  Any questions or concerns? No  Items Reviewed: Did the pt receive and understand the discharge instructions provided? Yes  Medications obtained and verified? Yes  Other? No  Any new allergies since your discharge? No  Dietary orders reviewed? No Do you have support at home? Yes   Functional Questionnaire: (I = Independent and D = Dependent) ADLs: I  Bathing/Dressing- I  Meal Prep- I  Eating- I  Maintaining continence- I  Transferring/Ambulation- I  Managing Meds- I   Follow up appointments reviewed:  PCP Hospital f/u appt confirmed? No  Encouraged pt to contact PCP for evaluation.  Specialist Hospital f/u appt confirmed? No   Are transportation arrangements needed? No  If their condition worsens, is the pt aware to call PCP or go to the Emergency Dept.? No Was the patient provided with contact information for the PCP's office or ED? No Was to pt encouraged to call back with questions or concerns? No

## 2021-06-05 ENCOUNTER — Other Ambulatory Visit: Payer: Self-pay

## 2021-06-05 ENCOUNTER — Encounter: Payer: Self-pay | Admitting: Emergency Medicine

## 2021-06-05 ENCOUNTER — Ambulatory Visit
Admission: EM | Admit: 2021-06-05 | Discharge: 2021-06-05 | Disposition: A | Discharge status: Home / Self Care | Payer: No Typology Code available for payment source

## 2021-06-05 ENCOUNTER — Encounter (HOSPITAL_COMMUNITY): Payer: Self-pay

## 2021-06-05 DIAGNOSIS — S39012A Strain of muscle, fascia and tendon of lower back, initial encounter: Secondary | ICD-10-CM | POA: Diagnosis not present

## 2021-06-05 DIAGNOSIS — F17210 Nicotine dependence, cigarettes, uncomplicated: Secondary | ICD-10-CM | POA: Insufficient documentation

## 2021-06-05 DIAGNOSIS — R911 Solitary pulmonary nodule: Secondary | ICD-10-CM | POA: Diagnosis not present

## 2021-06-05 DIAGNOSIS — Z79899 Other long term (current) drug therapy: Secondary | ICD-10-CM | POA: Diagnosis not present

## 2021-06-05 DIAGNOSIS — S301XXA Contusion of abdominal wall, initial encounter: Secondary | ICD-10-CM | POA: Insufficient documentation

## 2021-06-05 DIAGNOSIS — J45909 Unspecified asthma, uncomplicated: Secondary | ICD-10-CM | POA: Diagnosis not present

## 2021-06-05 DIAGNOSIS — Z7952 Long term (current) use of systemic steroids: Secondary | ICD-10-CM | POA: Insufficient documentation

## 2021-06-05 DIAGNOSIS — R0789 Other chest pain: Secondary | ICD-10-CM | POA: Diagnosis not present

## 2021-06-05 DIAGNOSIS — I10 Essential (primary) hypertension: Secondary | ICD-10-CM | POA: Insufficient documentation

## 2021-06-05 DIAGNOSIS — M545 Low back pain, unspecified: Secondary | ICD-10-CM | POA: Insufficient documentation

## 2021-06-05 DIAGNOSIS — S3991XA Unspecified injury of abdomen, initial encounter: Secondary | ICD-10-CM | POA: Diagnosis present

## 2021-06-05 DIAGNOSIS — S20212A Contusion of left front wall of thorax, initial encounter: Secondary | ICD-10-CM | POA: Diagnosis not present

## 2021-06-05 DIAGNOSIS — Y92410 Unspecified street and highway as the place of occurrence of the external cause: Secondary | ICD-10-CM | POA: Diagnosis not present

## 2021-06-05 DIAGNOSIS — R079 Chest pain, unspecified: Secondary | ICD-10-CM | POA: Diagnosis not present
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
## 2021-06-06 ENCOUNTER — Emergency Department (HOSPITAL_COMMUNITY)
Admission: EM | Admit: 2021-06-06 | Discharge: 2021-06-06 | Disposition: A | Discharge status: Home / Self Care | Payer: Medicaid Other | Attending: Emergency Medicine | Admitting: Emergency Medicine

## 2021-06-06 ENCOUNTER — Emergency Department (HOSPITAL_COMMUNITY): Payer: Medicaid Other

## 2021-06-06 DIAGNOSIS — R911 Solitary pulmonary nodule: Secondary | ICD-10-CM | POA: Diagnosis not present

## 2021-06-06 DIAGNOSIS — S20219A Contusion of unspecified front wall of thorax, initial encounter: Secondary | ICD-10-CM

## 2021-06-06 DIAGNOSIS — S39012A Strain of muscle, fascia and tendon of lower back, initial encounter: Secondary | ICD-10-CM

## 2021-06-06 DIAGNOSIS — M545 Low back pain, unspecified: Secondary | ICD-10-CM | POA: Diagnosis not present

## 2021-06-06 DIAGNOSIS — S301XXA Contusion of abdominal wall, initial encounter: Secondary | ICD-10-CM

## 2021-06-06 DIAGNOSIS — S20212A Contusion of left front wall of thorax, initial encounter: Secondary | ICD-10-CM | POA: Diagnosis not present

## 2021-06-06 DIAGNOSIS — R079 Chest pain, unspecified: Secondary | ICD-10-CM | POA: Diagnosis not present

## 2021-06-06 DIAGNOSIS — I10 Essential (primary) hypertension: Secondary | ICD-10-CM | POA: Diagnosis not present

## 2021-06-06 LAB — CBC WITH DIFFERENTIAL/PLATELET
Abs Immature Granulocytes: 0.03 10*3/uL (ref 0.00–0.07)
Basophils Absolute: 0.1 10*3/uL (ref 0.0–0.1)
Basophils Relative: 1 %
Eosinophils Absolute: 0.6 10*3/uL — ABNORMAL HIGH (ref 0.0–0.5)
Eosinophils Relative: 5 %
HCT: 47.7 % — ABNORMAL HIGH (ref 36.0–46.0)
Hemoglobin: 16 g/dL — ABNORMAL HIGH (ref 12.0–15.0)
Immature Granulocytes: 0 %
Lymphocytes Relative: 33 %
Lymphs Abs: 3.5 10*3/uL (ref 0.7–4.0)
MCH: 33.2 pg (ref 26.0–34.0)
MCHC: 33.5 g/dL (ref 30.0–36.0)
MCV: 99 fL (ref 80.0–100.0)
Monocytes Absolute: 0.7 10*3/uL (ref 0.1–1.0)
Monocytes Relative: 7 %
Neutro Abs: 5.7 10*3/uL (ref 1.7–7.7)
Neutrophils Relative %: 54 %
Platelets: 273 10*3/uL (ref 150–400)
RBC: 4.82 MIL/uL (ref 3.87–5.11)
RDW: 12.9 % (ref 11.5–15.5)
WBC: 10.6 10*3/uL — ABNORMAL HIGH (ref 4.0–10.5)
nRBC: 0 % (ref 0.0–0.2)

## 2021-06-06 LAB — TROPONIN I (HIGH SENSITIVITY)
Troponin I (High Sensitivity): <2 ng/L (ref <18)
Troponin I (High Sensitivity): <2 ng/L (ref <18)

## 2021-06-06 LAB — COMPREHENSIVE METABOLIC PANEL
ALT: 15 U/L (ref 0–44)
AST: 21 U/L (ref 15–41)
Albumin: 4.3 g/dL (ref 3.5–5.0)
Alkaline Phosphatase: 91 U/L (ref 38–126)
Anion gap: 11 (ref 5–15)
BUN: 19 mg/dL (ref 6–20)
CO2: 18 mmol/L — ABNORMAL LOW (ref 22–32)
Calcium: 9.1 mg/dL (ref 8.9–10.3)
Chloride: 106 mmol/L (ref 98–111)
Creatinine, Ser: 0.83 mg/dL (ref 0.44–1.00)
GFR, Estimated: >60 mL/min (ref >60)
Glucose, Bld: 94 mg/dL (ref 70–99)
Potassium: 4.3 mmol/L (ref 3.5–5.1)
Sodium: 135 mmol/L (ref 135–145)
Total Bilirubin: 0.8 mg/dL (ref 0.3–1.2)
Total Protein: 7.6 g/dL (ref 6.5–8.1)

## 2021-06-06 MED ORDER — SODIUM CHLORIDE 0.9 % IV BOLUS
1000.0000 mL | Freq: Once | INTRAVENOUS | Status: AC
  Administered 2021-06-06: 1000 mL via INTRAVENOUS

## 2021-06-06 MED ORDER — IOHEXOL 300 MG/ML  SOLN
100.0000 mL | Freq: Once | INTRAMUSCULAR | Status: AC | PRN
  Administered 2021-06-06: 100 mL via INTRAVENOUS

## 2021-06-06 MED ORDER — TRAMADOL HCL 50 MG PO TABS: 50.0000 mg | ORAL_TABLET | Freq: Four times a day (QID) | ORAL | 0 refills | Status: DC | PRN

## 2021-06-06 NOTE — ED Notes (Signed)
Patient transported to CT 
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
## 2021-06-06 NOTE — Discharge Instructions (Addendum)
Take ibuprofen 600 mg every 6 hours as needed for pain.  Begin taking tramadol as needed for pain not relieved with ibuprofen.  Follow-up with your primary doctor if symptoms are not improving in the next 3 to 4 days.

## 2021-06-07 ENCOUNTER — Telehealth: Payer: Self-pay

## 2021-06-07 NOTE — Telephone Encounter (Signed)
Transition Care Management Follow-up Telephone Call Date of discharge and from where: 06/06/2021 from [facility] How have you been since you were released from the hospital? Pt stated that she is still in pain.  Any questions or concerns? No  Items Reviewed: Did the pt receive and understand the discharge instructions provided? Yes  Medications obtained and verified? Yes  Other? No  Any new allergies since your discharge? No  Dietary orders reviewed? No Do you have support at home? Yes    Functional Questionnaire: (I = Independent and D = Dependent) ADLs: I   Bathing/Dressing- I   Meal Prep- I   Eating- I   Maintaining continence- I   Transferring/Ambulation- I   Managing Meds- I     Follow up appointments reviewed:   PCP Hospital f/u appt confirmed? No  Encouraged pt to contact PCP for evaluation.  Specialist Hospital f/u appt confirmed? No   Are transportation arrangements needed? No  If their condition worsens, is the pt aware to call PCP or go to the Emergency Dept.? No Was the patient provided with contact information for the PCP's office or ED? No Was to pt encouraged to call back with questions or concerns? No

## 2021-07-02 ENCOUNTER — Ambulatory Visit
Admission: EM | Admit: 2021-07-02 | Discharge: 2021-07-02 | Disposition: A | Discharge status: Home / Self Care | Payer: Medicaid Other | Attending: Family Medicine | Admitting: Family Medicine

## 2021-07-02 ENCOUNTER — Encounter: Payer: Self-pay | Admitting: Emergency Medicine

## 2021-07-02 DIAGNOSIS — N760 Acute vaginitis: Secondary | ICD-10-CM | POA: Insufficient documentation

## 2021-07-02 LAB — POCT URINALYSIS DIP (MANUAL ENTRY)
Bilirubin, UA: NEGATIVE
Blood, UA: NEGATIVE
Glucose, UA: NEGATIVE mg/dL
Ketones, POC UA: NEGATIVE mg/dL
Nitrite, UA: NEGATIVE
Protein Ur, POC: NEGATIVE mg/dL
Spec Grav, UA: 1.02 (ref 1.010–1.025)
Urobilinogen, UA: 0.2 E.U./dL
pH, UA: 7 (ref 5.0–8.0)

## 2021-07-02 MED ORDER — FLUCONAZOLE 100 MG PO TABS: 100.0000 mg | ORAL_TABLET | ORAL | 0 refills | Status: DC

## 2021-07-02 NOTE — ED Triage Notes (Signed)
PT here with vaginal itching and cramping. Some vaginal discharge that is milky and white. Pt is on antibiotics for another medical issue.

## 2021-07-02 NOTE — ED Provider Notes (Signed)
Destiny Petty    CSN: [id number] Arrival date & time: 07/02/21  1910      History   Chief Complaint Chief Complaint  Patient presents with   Abdominal Pain   Vaginal Itching   Vaginal Discharge    HPI Destiny Petty is a 40 y.o. female.   HPI Patient in with abdominal cramping, and vaginal irritation ongoing for nearly a week.  Patient was recently treated with 3 weeks of Augmentin for chronic recurrent upper respiratory illness however she did not complete the entire 3 weeks.  She noticed the vaginal irritation started a few days after taking the antibiotics.  She is also requesting STI testing.  She has used a Monistat over-the-counter yeast preparation without relief of symptoms.  She is afebrile. 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   Anxiety    Arthritis 12/04/2016   Phreesia 05/23/2020   Asthma    Back pain with right-sided sciatica 06/18/2016   Bipolar disorder (HCC) 01/15/2016   Overview:  Tried Depakote   Body mass index 36.0-36.9, adult 04/10/2016   Body mass index 37.0-37.9, adult 03/13/2016   Depression    Depressive disorder, not elsewhere classified 03/27/2007   Dysmenorrhea 04/10/2016   Family history of adverse reaction to anesthesia    PONV   GERD (gastroesophageal reflux disease)    Hemorrhoids 02/10/2015   Hiatal hernia    Hyperlipidemia 04/09/2011   Hypertension    no meds   Injury of digital nerve of right little finger 08/23/2020   Laceration of blood vessel of right little finger 08/23/2020   Laceration of finger of left hand without damage to nail    left small finger   Menorrhagia with regular cycle 04/10/2016   Migraine 01/15/2016   Overview:  Overview:  IMO Load 2016 R1.3   Muscle spasm of back 03/13/2016   Numbness and tingling in left hand 08/19/2016   Obesity    Ovarian cyst 12/18/2016   Perianal wart 02/10/2015   Stress 02/14/2016   Weight loss counseling, encounter for 03/13/2016     Patient Active Problem List   Diagnosis Date Noted   DOE (dyspnea on exertion) 03/26/2021   Cigarette smoker 03/26/2021   Vagina, candidiasis 03/12/2021   Headache 01/10/2021   Environmental and seasonal allergies 07/28/2020   Arthritis of left knee 06/01/2020   Screening for diabetes mellitus 06/01/2020   Visit for screening mammogram 06/01/2020   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   Back pain with right-sided sciatica 06/18/2016   Morbid obesity due to excess calories (HCC) 03/13/2016   Gallstones 01/29/2016   Recurrent major depressive disorder, in full remission (HCC) 01/15/2016   Nicotine addiction 07/10/2013   GERD (gastroesophageal reflux disease) 07/10/2013   Essential hypertension 07/08/2013   Asthma, mild persistent 04/05/2013   Orthopnea 12/18/2011   Hyperlipidemia 04/09/2011    Past Surgical History:  Procedure Laterality Date   ABLATION     CESAREAN SECTION     CESAREAN SECTION N/A    Phreesia 05/23/2020   CHOLECYSTECTOMY     COLONOSCOPY     DILATATION AND CURETTAGE/HYSTEROSCOPY WITH MINERVA N/A 08/31/2019   Procedure: DILATATION AND CURETTAGE (no specimen) /HYSTEROSCOPY WITH MINERVA ENDOMETRIAL ABLATION;  Surgeon: Tilda Burrow, MD;  Location: [facility];  Service: Gynecology;  Laterality: N/A;   ESOPHAGOGASTRODUODENOSCOPY ENDOSCOPY     TENDON EXPLORATION Left 08/10/2020   Procedure: LEFT SMALL FINER EXPLORATION, REPAIR OF TENDON,  ARTERY, NERVE;  Surgeon: Betha Loa, MD;  Location: [facility];  Service: Orthopedics;  Laterality: Left;   TUBAL LIGATION      OB History     Gravida  2   Para  2   Term  2   Preterm      AB      Living  2      SAB      IAB      Ectopic      Multiple      Live Births  2            Home Medications    Prior to Admission medications   Medication Sig Start Date End Date Taking? Authorizing Provider  amLODipine (NORVASC) 5 MG tablet Take 1 tablet (5 mg total) by mouth  daily. 05/09/21   Heather Roberts, NP  buPROPion (WELLBUTRIN XL) 300 MG 24 hr tablet TAKE 1 TABLET BY MOUTH ONCE A DAY. 02/26/21   Heather Roberts, NP  chlorpheniramine (CHLOR-TRIMETON) 4 MG tablet Take 4 mg by mouth every 4 (four) hours as needed for allergies.    [provider]  famotidine (PEPCID) 20 MG tablet One after supper 03/26/21   Nyoka Cowden, MD  fluconazole (DIFLUCAN) 100 MG tablet Take 1 tablet (100 mg total) by mouth every 3 (three) days. 07/02/21   Bing Neighbors, FNP  fluticasone (FLONASE) 50 MCG/ACT nasal spray INSTILL 2 SPRAYS INTO BOTH NOSTRILS DAILY. 03/26/21   Freddy Finner, NP  hydrochlorothiazide (MICROZIDE) 12.5 MG capsule TAKE ONE TABLET BY MOUTH ONCE DAILY. 05/09/21   Heather Roberts, NP  pantoprazole (PROTONIX) 40 MG tablet Take 1 tablet (40 mg total) by mouth daily. Take 30-60 min before first meal of the day 03/26/21   Nyoka Cowden, MD  PROAIR HFA 108 [phone] Base) MCG/ACT inhaler INHALE 1 OR 2 PUFFS INTO THE LUNGS EVERY 6 HOURS AS NEEDED FOR WHEEZING OR SHORTNESS OF BREATH 05/15/21   Heather Roberts, NP  rizatriptan (MAXALT-MLT) 10 MG disintegrating tablet Take 1 tablet (10 mg total) by mouth as needed for migraine. May repeat in 2 hours if needed 05/09/21   Heather Roberts, NP  Tiotropium Bromide-Olodaterol (STIOLTO RESPIMAT) 2.5-2.5 MCG/ACT AERS Inhale 2 puffs into the lungs daily. 05/30/21   Nyoka Cowden, MD  Tiotropium Bromide-Olodaterol (STIOLTO RESPIMAT) 2.5-2.5 MCG/ACT AERS Inhale 2 puffs into the lungs daily. 05/30/21   Nyoka Cowden, MD  topiramate (TOPAMAX) 100 MG tablet TAKE (1) TABLET BY MOUTH ONCE DAILY. 05/09/21   Heather Roberts, NP  traMADol (ULTRAM) 50 MG tablet Take 1 tablet (50 mg total) by mouth every 6 (six) hours as needed. 06/06/21   Geoffery Lyons, MD    Family History Family History  Problem Relation Age of Onset   Diabetes Mother    Kidney failure Mother    Early death Mother 42       diabetes complications   Cancer Father 59       colon  cancer   Depression Sister    Hypertension Sister    Anxiety disorder Sister    Other Sister        back problems   ADD / ADHD Daughter    Other Daughter        behavioral issues   ODD Daughter    Asthma Son    Cancer Maternal Grandmother        breast   Seizures Maternal Grandmother  Lupus Other    Breast cancer Maternal Aunt     Social History Social History   Tobacco Use   Smoking status: Every Day    Packs/day: 1.00    Years: 26.00    Pack years: 26.00    Types: Cigarettes   Smokeless tobacco: Never   Tobacco comments:    1/4 ppd 05/30/21  Vaping Use   Vaping Use: Never used  Substance Use Topics   Alcohol use: Yes    Comment: occ   Drug use: No     Allergies   Bee venom, Latex, and Lisinopril   Review of Systems Review of Systems Pertinent negatives listed in HPI  Physical Exam Triage Vital Signs ED Triage Vitals [07/02/21 1921]  Enc Vitals Group     BP (!) 147/88     Pulse Rate 85     Resp 18     Temp 98.6 F (37 C)     Temp Source Oral     SpO2 95 %     Weight      Height      Head Circumference      Peak Flow      Pain Score 6     Pain Loc      Pain Edu?      Excl. in GC?    No data found.  Updated Vital Signs BP (!) 147/88   Pulse 85   Temp 98.6 F (37 C) (Oral)   Resp 18   SpO2 95%   Visual Acuity Right Eye Distance:   Left Eye Distance:   Bilateral Distance:    Right Eye Near:   Left Eye Near:    Bilateral Near:     Physical Exam   UC Treatments / Results  Labs (all labs ordered are listed, but only abnormal results are displayed) Labs Reviewed  POCT URINALYSIS DIP (MANUAL ENTRY) - Abnormal; Notable for the following components:      Result Value   Clarity, UA cloudy (*)    Leukocytes, UA Trace (*)    All other components within normal limits  URINE CULTURE  CERVICOVAGINAL ANCILLARY ONLY    EKG   Radiology No results found.  Procedures Procedures (including critical care time)  Medications  Ordered in UC Medications - No data to display  Initial Impression / Assessment and Plan / UC Course  I have reviewed the triage vital signs and the nursing notes.  Pertinent labs & imaging results that were available during my care of the patient were reviewed by me and considered in my medical decision making (see chart for details).    Treating for vaginitis likely secondary to antibiotic use. Vaginal cytology pending.  Cervical cytology pending. Take Diflucan as prescribed.  If any additional treatment is warranted we will notify you by phone. Final Clinical Impressions(s) / UC Diagnoses   Final diagnoses:  Vaginitis and vulvovaginitis   Discharge Instructions   None    ED Prescriptions     Medication Sig Dispense Auth. Provider   fluconazole (DIFLUCAN) 100 MG tablet  (Status: Discontinued) Take 1 tablet (100 mg total) by mouth every 3 (three) days. 3 tablet Bing Neighbors, FNP   fluconazole (DIFLUCAN) 100 MG tablet Take 1 tablet (100 mg total) by mouth every 3 (three) days. 3 tablet Bing Neighbors, FNP      PDMP not reviewed this encounter.   Bing Neighbors, FNP 07/03/21 1925

## 2021-07-04 ENCOUNTER — Telehealth (HOSPITAL_COMMUNITY): Payer: Self-pay | Admitting: Emergency Medicine

## 2021-07-04 LAB — CERVICOVAGINAL ANCILLARY ONLY
Bacterial Vaginitis (gardnerella): POSITIVE — AB
Candida Glabrata: NEGATIVE
Candida Vaginitis: POSITIVE — AB
Chlamydia: NEGATIVE
Comment: NEGATIVE
Comment: NEGATIVE
Comment: NEGATIVE
Comment: NEGATIVE
Comment: NEGATIVE
Comment: NORMAL
Neisseria Gonorrhea: NEGATIVE
Trichomonas: POSITIVE — AB

## 2021-07-04 MED ORDER — METRONIDAZOLE 500 MG PO TABS: 500.0000 mg | ORAL_TABLET | Freq: Two times a day (BID) | ORAL | 0 refills | Status: DC

## 2021-07-05 LAB — URINE CULTURE: Culture: 10000 — AB

## 2021-07-16 ENCOUNTER — Ambulatory Visit: Payer: Medicaid Other | Admitting: Internal Medicine

## 2021-07-21 ENCOUNTER — Ambulatory Visit
Admission: EM | Admit: 2021-07-21 | Discharge: 2021-07-21 | Disposition: A | Discharge status: Home / Self Care | Payer: Medicaid Other | Attending: Emergency Medicine | Admitting: Emergency Medicine

## 2021-07-21 DIAGNOSIS — N898 Other specified noninflammatory disorders of vagina: Secondary | ICD-10-CM

## 2021-07-21 MED ORDER — METRONIDAZOLE 500 MG PO TABS: 500.0000 mg | ORAL_TABLET | Freq: Two times a day (BID) | ORAL | 0 refills | Status: DC

## 2021-07-21 MED ORDER — FLUCONAZOLE 200 MG PO TABS: ORAL_TABLET | ORAL | 0 refills | Status: DC

## 2021-07-21 NOTE — ED Triage Notes (Signed)
Pt came in for a follow up concerning her vaginal discharge. Pt was recently seen for this issue on 07/02/21. Pt would like to be rechecked for the positive trichomonas

## 2021-07-21 NOTE — Discharge Instructions (Addendum)
Vaginal self-swab obtained.  We will follow up with you regarding abnormal results Prescribed metronidazole 500 mg twice daily for 7 days (do not take while consuming alcohol and/or if breastfeeding) Prescribed diflucan 200 mg once daily and then second dose 72 hours later Take medications as prescribed and to completion If tests results are positive, please abstain from sexual activity until you and your partner(s) have been treated Follow up with PCP Return here or go to ER if you have any new or worsening symptoms fever, chills, nausea, vomiting, abdominal or pelvic pain, painful intercourse, vaginal discharge, vaginal bleeding, persistent symptoms despite treatment, etc... 

## 2021-07-21 NOTE — ED Provider Notes (Signed)
[facility]   [id number] 07/21/21 Arrival Time: 1740   CX:KGYJEHU DISCHARGE  SUBJECTIVE:  Destiny Petty is a 40 y.o. female who presents with complaints of vaginal discharge and odor x 3 weeks.  Was seen here and treated for yeast, trich, and BV.  States symptoms mostly have improved, but still has some discharge and odor.  Describes discharge as thick and white.  She reports similar symptoms in the past.  Also mentions cramping and diarrhea.  She denies fever, chills, nausea, vomiting, abdominal or pelvic pain, urinary symptoms, vaginal itching, vaginal bleeding, dyspareunia, vaginal rashes or lesions.   No LMP recorded. Patient has had an ablation.  ROS: As per HPI.  All other pertinent ROS negative.     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   Anxiety    Arthritis 12/04/2016   Phreesia 05/23/2020   Asthma    Back pain with right-sided sciatica 06/18/2016   Bipolar disorder (HCC) 01/15/2016   Overview:  Tried Depakote   Body mass index 36.0-36.9, adult 04/10/2016   Body mass index 37.0-37.9, adult 03/13/2016   Depression    Depressive disorder, not elsewhere classified 03/27/2007   Dysmenorrhea 04/10/2016   Family history of adverse reaction to anesthesia    PONV   GERD (gastroesophageal reflux disease)    Hemorrhoids 02/10/2015   Hiatal hernia    Hyperlipidemia 04/09/2011   Hypertension    no meds   Injury of digital nerve of right little finger 08/23/2020   Laceration of blood vessel of right little finger 08/23/2020   Laceration of finger of left hand without damage to nail    left small finger   Menorrhagia with regular cycle 04/10/2016   Migraine 01/15/2016   Overview:  Overview:  IMO Load 2016 R1.3   Muscle spasm of back 03/13/2016   Numbness and tingling in left hand 08/19/2016   Obesity    Ovarian cyst 12/18/2016   Perianal wart 02/10/2015   Stress 02/14/2016   Weight loss counseling, encounter for 03/13/2016    Past Surgical History:  Procedure Laterality Date   ABLATION     CESAREAN SECTION     CESAREAN SECTION N/A    Phreesia 05/23/2020   CHOLECYSTECTOMY     COLONOSCOPY     DILATATION AND CURETTAGE/HYSTEROSCOPY WITH MINERVA N/A 08/31/2019   Procedure: DILATATION AND CURETTAGE (no specimen) /HYSTEROSCOPY WITH MINERVA ENDOMETRIAL ABLATION;  Surgeon: Tilda Burrow, MD;  Location: [facility];  Service: Gynecology;  Laterality: N/A;   ESOPHAGOGASTRODUODENOSCOPY ENDOSCOPY     TENDON EXPLORATION Left 08/10/2020   Procedure: LEFT SMALL FINER EXPLORATION, REPAIR OF TENDON, ARTERY, NERVE;  Surgeon: Betha Loa, MD;  Location: [facility];  Service: Orthopedics;  Laterality: Left;   TUBAL LIGATION     Allergies  Allergen Reactions   Bee Venom Shortness Of Breath   Latex Itching   Lisinopril Cough    coughing   No current facility-administered medications on file prior to encounter.   Current Outpatient Medications on File Prior to Encounter  Medication Sig Dispense Refill   amLODipine (NORVASC) 5 MG tablet Take 1 tablet (5 mg total) by mouth daily. 30 tablet 1   buPROPion (WELLBUTRIN XL) 300 MG 24 hr tablet TAKE 1 TABLET BY MOUTH ONCE A DAY. 90 tablet 0   chlorpheniramine (CHLOR-TRIMETON) 4 MG tablet Take 4 mg by mouth every 4 (four) hours as needed for allergies.  famotidine (PEPCID) 20 MG tablet One after supper 30 tablet 11   fluticasone (FLONASE) 50 MCG/ACT nasal spray INSTILL 2 SPRAYS INTO BOTH NOSTRILS DAILY. 16 g 0   hydrochlorothiazide (MICROZIDE) 12.5 MG capsule TAKE ONE TABLET BY MOUTH ONCE DAILY. 30 capsule 2   pantoprazole (PROTONIX) 40 MG tablet Take 1 tablet (40 mg total) by mouth daily. Take 30-60 min before first meal of the day 30 tablet 2   PROAIR HFA 108 (90 Base) MCG/ACT inhaler INHALE 1 OR 2 PUFFS INTO THE LUNGS EVERY 6 HOURS AS NEEDED FOR WHEEZING OR SHORTNESS OF BREATH 8.5 g 0   rizatriptan (MAXALT-MLT) 10 MG disintegrating tablet Take 1 tablet (10 mg total)  by mouth as needed for migraine. May repeat in 2 hours if needed 10 tablet 0   Tiotropium Bromide-Olodaterol (STIOLTO RESPIMAT) 2.5-2.5 MCG/ACT AERS Inhale 2 puffs into the lungs daily. 4 g 11   Tiotropium Bromide-Olodaterol (STIOLTO RESPIMAT) 2.5-2.5 MCG/ACT AERS Inhale 2 puffs into the lungs daily. 4 g 0   topiramate (TOPAMAX) 100 MG tablet TAKE (1) TABLET BY MOUTH ONCE DAILY. 30 tablet 2   traMADol (ULTRAM) 50 MG tablet Take 1 tablet (50 mg total) by mouth every 6 (six) hours as needed. 10 tablet 0    Social History   Socioeconomic History   Marital status: Legally Separated    Spouse name: Not on file   Number of children: 2   Years of education: 14   Highest education level: Not on file  Occupational History   Not on file  Tobacco Use   Smoking status: Every Day    Packs/day: 1.00    Years: 26.00    Pack years: 26.00    Types: Cigarettes   Smokeless tobacco: Never   Tobacco comments:    1/4 ppd 05/30/21  Vaping Use   Vaping Use: Never used  Substance and Sexual Activity   Alcohol use: Yes    Comment: occ   Drug use: No   Sexual activity: Yes    Birth control/protection: Surgical    Comment: tubal  Other Topics Concern   Not on file  Social History Narrative   Legally separated right now   2 children: son 87 and daughter 86 lives with her      2 dogs: macho, mary jane   2 birds:    1 cats: boghgie       Enjoys: spending time with kids, outside things      Diet: eat all food groups    Caffeine: coffee and soda daily   Water: 4 cups daily      Wears seat belt   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   No weapons        Social Determinants of Corporate investment banker Strain: Not on file  Food Insecurity: Not on file  Transportation Needs: Not on file  Physical Activity: Not on file  Stress: Not on file  Social Connections: Not on file  Intimate Partner Violence: Not on file   Family History  Problem Relation Age of Onset   Diabetes  Mother    Kidney failure Mother    Early death Mother 45       diabetes complications   Cancer Father 64       colon cancer   Depression Sister    Hypertension Sister    Anxiety disorder Sister    Other Sister  back problems   ADD / ADHD Daughter    Other Daughter        behavioral issues   ODD Daughter    Asthma Son    Cancer Maternal Grandmother        breast   Seizures Maternal Grandmother    Lupus Other    Breast cancer Maternal Aunt     OBJECTIVE:  Vitals:   07/21/21 0904  BP: 129/88  Pulse: 97  Resp: 16  Temp: 98.9 F (37.2 C)  TempSrc: Oral  SpO2: 96%    General appearance: Alert, NAD, appears stated age Head: NCAT Throat: lips, mucosa, and tongue normal; teeth and gums normal Lungs: CTA bilaterally without adventitious breath sounds Heart: regular rate and rhythm.   Back: no CVA tenderness Abdomen: soft, non-tender; bowel sounds normal; no guarding GU: deferred Skin: warm and dry Psychological:  Alert and cooperative. Normal mood and affect.  ASSESSMENT & PLAN:  1. Vaginal discharge   2. Vaginal odor     Meds ordered this encounter  Medications   fluconazole (DIFLUCAN) 200 MG tablet    Sig: Take one dose by mouth, wait 72 hours, and then take second dose by mouth    Dispense:  2 tablet    Refill:  0    Order Specific Question:   Supervising Provider    Answer:   Eustace Moore [[id number]]   metroNIDAZOLE (FLAGYL) 500 MG tablet    Sig: Take 1 tablet (500 mg total) by mouth 2 (two) times daily.    Dispense:  14 tablet    Refill:  0    Order Specific Question:   Supervising Provider    Answer:   Eustace Moore [[id number]]    Pending: Labs Reviewed - No data to display  Vaginal self-swab obtained.  We will follow up with you regarding abnormal results Prescribed metronidazole 500 mg twice daily for 7 days (do not take while consuming alcohol and/or if breastfeeding) Prescribed diflucan 200 mg once daily and then second dose 72  hours later Take medications as prescribed and to completion If tests results are positive, please abstain from sexual activity until you and your partner(s) have been treated Follow up with PCP Return here or go to ER if you have any new or worsening symptoms fever, chills, nausea, vomiting, abdominal or pelvic pain, painful intercourse, vaginal discharge, vaginal bleeding, persistent symptoms despite treatment, etc...  Reviewed expectations re: course of current medical issues. Questions answered. Outlined signs and symptoms indicating need for more acute intervention. Patient verbalized understanding. After Visit Summary given.        Rennis Harding, PA-C 07/21/21 [phone]

## 2021-07-22 ENCOUNTER — Telehealth: Payer: Self-pay | Admitting: Emergency Medicine

## 2021-07-23 LAB — CERVICOVAGINAL ANCILLARY ONLY
Bacterial Vaginitis (gardnerella): NEGATIVE
Candida Glabrata: NEGATIVE
Candida Vaginitis: NEGATIVE
Chlamydia: NEGATIVE
Comment: NEGATIVE
Comment: NEGATIVE
Comment: NEGATIVE
Comment: NEGATIVE
Comment: NEGATIVE
Comment: NORMAL
Neisseria Gonorrhea: NEGATIVE
Trichomonas: NEGATIVE

## 2021-08-10 ENCOUNTER — Encounter: Payer: Medicaid Other | Admitting: Family Medicine

## 2021-08-10 ENCOUNTER — Encounter: Payer: Medicaid Other | Admitting: Nurse Practitioner

## 2021-08-10 ENCOUNTER — Other Ambulatory Visit (HOSPITAL_COMMUNITY): Payer: Medicaid Other

## 2021-08-14 ENCOUNTER — Ambulatory Visit (HOSPITAL_COMMUNITY): Payer: Medicaid Other

## 2021-08-16 ENCOUNTER — Other Ambulatory Visit: Payer: Self-pay | Admitting: Nurse Practitioner

## 2021-08-16 DIAGNOSIS — F17218 Nicotine dependence, cigarettes, with other nicotine-induced disorders: Secondary | ICD-10-CM

## 2021-08-21 ENCOUNTER — Other Ambulatory Visit: Payer: Self-pay

## 2021-08-21 ENCOUNTER — Ambulatory Visit
Admission: EM | Admit: 2021-08-21 | Discharge: 2021-08-21 | Disposition: A | Discharge status: Home / Self Care | Payer: Medicaid Other | Attending: Internal Medicine | Admitting: Internal Medicine

## 2021-08-21 ENCOUNTER — Encounter: Payer: Self-pay | Admitting: Emergency Medicine

## 2021-08-21 DIAGNOSIS — L255 Unspecified contact dermatitis due to plants, except food: Secondary | ICD-10-CM

## 2021-08-21 MED ORDER — HYDROXYZINE HCL 50 MG PO TABS: 50.0000 mg | ORAL_TABLET | Freq: Three times a day (TID) | ORAL | 0 refills | Status: DC | PRN

## 2021-08-21 MED ORDER — PREDNISONE 20 MG PO TABS: 40.0000 mg | ORAL_TABLET | Freq: Every day | ORAL | 0 refills | Status: DC

## 2021-08-21 NOTE — ED Triage Notes (Signed)
Poison oak since last Saturday while cleaning brush.  Areas spread all over body.  Has tried benadryl and calamine lotion with out relief

## 2021-08-21 NOTE — Discharge Instructions (Signed)
Please take medications as prescribed Continue to use calamine lotion when you are at home If you have worsening symptoms please return to urgent care to be reevaluated.

## 2021-08-22 NOTE — ED Provider Notes (Signed)
[facility]    CSN: [id number] Arrival date & time: 08/21/21  1912      History   Chief Complaint No chief complaint on file.   HPI Destiny Petty is a 40 y.o. female comes to urgent care with itchy papular rash on both upper and lower extremities of 4 days duration.  The rash started on the legs and is spreading to the arms.  The rash is pruritic.  She has applied calamine lotion with no improvement in her symptoms.  The rash is spreading to involve both legs and groin area.  She was exposed to some poison oak couple of days prior to onset of symptoms.  No fever or chills.  No joint aches.   HPI  Past Medical History:  Diagnosis Date  . Allergy    SEASONAL  . 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 Anxiety   . Arthritis 12/04/2016   Phreesia 05/23/2020  . Asthma   . Back pain with right-sided sciatica 06/18/2016  . Bipolar disorder (HCC) 01/15/2016   Overview:  Tried Depakote  . Body mass index 36.0-36.9, adult 04/10/2016  . Body mass index 37.0-37.9, adult 03/13/2016  . Depression   . Depressive disorder, not elsewhere classified 03/27/2007  . Dysmenorrhea 04/10/2016  . Family history of adverse reaction to anesthesia    PONV  . GERD (gastroesophageal reflux disease)   . Hemorrhoids 02/10/2015  . Hiatal hernia   . Hyperlipidemia 04/09/2011  . Hypertension    no meds  . 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 Laceration of blood vessel of right little finger 08/23/2020  . Laceration of finger of left hand without damage to nail    left small finger  . Menorrhagia with regular cycle 04/10/2016  . Migraine 01/15/2016   Overview:  Overview:  IMO Load 2016 R1.3  . Muscle spasm of back 03/13/2016  . Numbness and tingling in left hand 08/19/2016  . Obesity   . Ovarian cyst 12/18/2016  . Perianal wart 02/10/2015  . Stress 02/14/2016  . Weight loss counseling, encounter for 03/13/2016    Patient Active Problem List   Diagnosis Date Noted  . DOE (dyspnea on  exertion) 03/26/2021  . Cigarette smoker 03/26/2021  . Vagina, candidiasis 03/12/2021  . Headache 01/10/2021  . Environmental and seasonal allergies 07/28/2020  . Arthritis of left knee 06/01/2020  . Screening for diabetes mellitus 06/01/2020  . Visit for screening mammogram 06/01/2020  . 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 Back pain with right-sided sciatica 06/18/2016  . Morbid obesity due to excess calories (HCC) 03/13/2016  . Gallstones 01/29/2016  . Recurrent major depressive disorder, in full remission (HCC) 01/15/2016  . Nicotine addiction 07/10/2013  . GERD (gastroesophageal reflux disease) 07/10/2013  . Essential hypertension 07/08/2013  . Asthma, mild persistent 04/05/2013  . Orthopnea 12/18/2011  . Hyperlipidemia 04/09/2011    Past Surgical History:  Procedure Laterality Date  . ABLATION    . CESAREAN SECTION    . CESAREAN SECTION N/A    Phreesia 05/23/2020  . CHOLECYSTECTOMY    . COLONOSCOPY    . DILATATION AND CURETTAGE/HYSTEROSCOPY WITH MINERVA N/A 08/31/2019   Procedure: DILATATION AND CURETTAGE (no specimen) /HYSTEROSCOPY WITH MINERVA ENDOMETRIAL ABLATION;  Surgeon: Tilda Burrow, MD;  Location: [facility];  Service: Gynecology;  Laterality: N/A;  . ESOPHAGOGASTRODUODENOSCOPY ENDOSCOPY    . TENDON EXPLORATION Left 08/10/2020   Procedure: LEFT SMALL FINER EXPLORATION, REPAIR OF TENDON, ARTERY, NERVE;  Surgeon:  Betha Loa, MD;  Location: [facility];  Service: Orthopedics;  Laterality: Left;  . TUBAL LIGATION      OB History     Gravida  2   Para  2   Term  2   Preterm      AB      Living  2      SAB      IAB      Ectopic      Multiple      Live Births  2            Home Medications    Prior to Admission medications   Medication Sig Start Date End Date Taking? Authorizing Provider  hydrOXYzine (ATARAX/VISTARIL) 50 MG tablet Take 1 tablet (50 mg total) by mouth every 8 (eight) hours as needed. 08/21/21   Yes Cortlin Marano, Britta Mccreedy, MD  predniSONE (DELTASONE) 20 MG tablet Take 2 tablets (40 mg total) by mouth daily for 7 days. 08/21/21 08/28/21 Yes Keirra Zeimet, Britta Mccreedy, MD  amLODipine (NORVASC) 5 MG tablet Take 1 tablet (5 mg total) by mouth daily. 05/09/21   Heather Roberts, NP  buPROPion (WELLBUTRIN XL) 300 MG 24 hr tablet TAKE 1 TABLET BY MOUTH ONCE A DAY. 08/16/21   Heather Roberts, NP  famotidine (PEPCID) 20 MG tablet One after supper 03/26/21   Nyoka Cowden, MD  fluconazole (DIFLUCAN) 200 MG tablet Take one dose by mouth, wait 72 hours, and then take second dose by mouth 07/21/21   Wurst, [place], PA-C  fluticasone (FLONASE) 50 MCG/ACT nasal spray INSTILL 2 SPRAYS INTO BOTH NOSTRILS DAILY. 03/26/21   Freddy Finner, NP  hydrochlorothiazide (MICROZIDE) 12.5 MG capsule TAKE ONE TABLET BY MOUTH ONCE DAILY. 05/09/21   Heather Roberts, NP  metroNIDAZOLE (FLAGYL) 500 MG tablet Take 1 tablet (500 mg total) by mouth 2 (two) times daily. 07/21/21   Wurst, [place], PA-C  pantoprazole (PROTONIX) 40 MG tablet Take 1 tablet (40 mg total) by mouth daily. Take 30-60 min before first meal of the day 03/26/21   Nyoka Cowden, MD  PROAIR HFA [phone] Base) MCG/ACT inhaler INHALE 1 OR 2 PUFFS INTO THE LUNGS EVERY 6 HOURS AS NEEDED FOR WHEEZING OR SHORTNESS OF BREATH 05/15/21   Heather Roberts, NP  rizatriptan (MAXALT) 5 MG tablet TAKE (1) TABLET BY MOUTH AS NEEDED FOR MIGRAINE ( MAY REPEAT IN 2 HOURS IF NEEDED0 08/16/21   Heather Roberts, NP  rizatriptan (MAXALT-MLT) 10 MG disintegrating tablet Take 1 tablet (10 mg total) by mouth as needed for migraine. May repeat in 2 hours if needed 05/09/21   Heather Roberts, NP  Tiotropium Bromide-Olodaterol (STIOLTO RESPIMAT) 2.5-2.5 MCG/ACT AERS Inhale 2 puffs into the lungs daily. 05/30/21   Nyoka Cowden, MD  Tiotropium Bromide-Olodaterol (STIOLTO RESPIMAT) 2.5-2.5 MCG/ACT AERS Inhale 2 puffs into the lungs daily. 05/30/21   Nyoka Cowden, MD  topiramate (TOPAMAX) 100 MG tablet TAKE (1) TABLET BY  MOUTH ONCE DAILY. 05/09/21   Heather Roberts, NP  traMADol (ULTRAM) 50 MG tablet Take 1 tablet (50 mg total) by mouth every 6 (six) hours as needed. 06/06/21   Geoffery Lyons, MD    Family History Family History  Problem Relation Age of Onset  . Diabetes Mother   . Kidney failure Mother   . Early death Mother 51       diabetes complications  . Cancer Father 42       colon  cancer  . Depression Sister   . Hypertension Sister   . Anxiety disorder Sister   . Other Sister        back problems  . ADD / ADHD Daughter   . Other Daughter        behavioral issues  . ODD Daughter   . Asthma Son   . Cancer Maternal Grandmother        breast  . Seizures Maternal Grandmother   . Lupus Other   . Breast cancer Maternal Aunt     Social History Social History   Tobacco Use  . Smoking status: Every Day    Packs/day: 1.00    Years: 26.00    Pack years: 26.00    Types: Cigarettes  . Smokeless tobacco: Never  . Tobacco comments:    1/4 ppd 05/30/21  Vaping Use  . Vaping Use: Never used  Substance Use Topics  . Alcohol use: Yes    Comment: occ  . Drug use: No     Allergies   Bee venom, Latex, and Lisinopril   Review of Systems Review of Systems  Genitourinary: Negative.   Skin:  Positive for color change and rash. Negative for wound.    Physical Exam Triage Vital Signs ED Triage Vitals  Enc Vitals Group     BP 08/21/21 2006 (!) 142/86     Pulse Rate 08/21/21 2006 (!) 110     Resp 08/21/21 2006 16     Temp 08/21/21 2006 98.1 F (36.7 C)     Temp Source 08/21/21 2006 Oral     SpO2 08/21/21 2006 98 %     Weight --      Height --      Head Circumference --      Peak Flow --      Pain Score 08/21/21 2008 10     Pain Loc --      Pain Edu? --      Excl. in GC? --    No data found.  Updated Vital Signs BP (!) 142/86 (BP Location: Right Arm)   Pulse (!) 110   Temp 98.1 F (36.7 C) (Oral)   Resp 16   LMP  (Approximate)   SpO2 98%   Visual Acuity Right Eye  Distance:   Left Eye Distance:   Bilateral Distance:    Right Eye Near:   Left Eye Near:    Bilateral Near:     Physical Exam Vitals and nursing note reviewed.  Constitutional:      General: She is not in acute distress.    Appearance: She is not ill-appearing.  Musculoskeletal:        General: Normal range of motion.  Skin:    General: Skin is warm.     Coloration: Skin is not jaundiced.     Findings: Erythema and rash present. No bruising or lesion.  Neurological:     Mental Status: She is alert.     UC Treatments / Results  Labs (all labs ordered are listed, but only abnormal results are displayed) Labs Reviewed - No data to display  EKG   Radiology No results found.  Procedures Procedures (including critical care time)  Medications Ordered in UC Medications - No data to display  Initial Impression / Assessment and Plan / UC Course  I have reviewed the triage vital signs and the nursing notes.  Pertinent labs & imaging results that were available during my care of the patient were reviewed  by me and considered in my medical decision making (see chart for details).     1.Rhus dermatitis: Prednisone 20 mg orally daily for 7 days Hydroxyzine as needed for itching If symptoms worsen, patient is advised to return to the urgent care to be reevaluated for superimposed skin infection. Final Clinical Impressions(s) / UC Diagnoses   Final diagnoses:  Rhus dermatitis     Discharge Instructions      Please take medications as prescribed Continue to use calamine lotion when you are at home If you have worsening symptoms please return to urgent care to be reevaluated.   ED Prescriptions     Medication Sig Dispense Auth. Provider   predniSONE (DELTASONE) 20 MG tablet Take 2 tablets (40 mg total) by mouth daily for 7 days. 14 tablet Bralyn Espino, Britta Mccreedy, MD   hydrOXYzine (ATARAX/VISTARIL) 50 MG tablet Take 1 tablet (50 mg total) by mouth every 8 (eight) hours as  needed. 40 tablet Aashi Derrington, Britta Mccreedy, MD      PDMP not reviewed this encounter.   Merrilee Jansky, MD 08/22/21 [phone]

## 2021-08-28 ENCOUNTER — Encounter: Payer: Self-pay | Admitting: Emergency Medicine

## 2021-08-28 ENCOUNTER — Ambulatory Visit
Admission: EM | Admit: 2021-08-28 | Discharge: 2021-08-28 | Disposition: A | Discharge status: Home / Self Care | Payer: Medicaid Other | Attending: Family Medicine | Admitting: Family Medicine

## 2021-08-28 ENCOUNTER — Other Ambulatory Visit: Payer: Self-pay

## 2021-08-28 DIAGNOSIS — I776 Arteritis, unspecified: Secondary | ICD-10-CM | POA: Diagnosis not present

## 2021-08-28 DIAGNOSIS — Z1152 Encounter for screening for COVID-19: Secondary | ICD-10-CM | POA: Diagnosis not present

## 2021-08-28 MED ORDER — PREDNISONE 10 MG (48) PO TBPK: ORAL_TABLET | ORAL | 0 refills | Status: DC

## 2021-08-28 NOTE — ED Notes (Signed)
Unable to draw protime-INR due to not having the collection tube provider aware and ok

## 2021-08-28 NOTE — ED Triage Notes (Signed)
Poison oak all over body x 2 weeks.  Seen here for the same at that time.  Also exposed to covid and would like test

## 2021-08-28 NOTE — Discharge Instructions (Addendum)
You have been tested for COVID-19 today. If your test returns positive, you will receive a phone call from [facility] regarding your results. Negative test results are not called. Both positive and negative results area always visible on MyChart. If you do not have a MyChart account, sign up instructions are provided in your discharge papers. Please do not hesitate to contact us should you have questions or concerns.  Vasculitis is inflammation of the blood vessels that may cause them to become thick, narrow, scarred, or weak. Enough blood may not be able to flow through them. This can cause damage throughout your body. The exact cause of this condition is not known. However, vasculitis can develop when the body's immune system attacks its own blood vessels. This attack may be caused by an infection, an immune system disease, an allergic reaction to a medicine, or a cancer that affects blood cells, such as leukemia or lymphoma. Vasculitis cannot always be cured. Sometimes symptoms go away but the disease does not (the disease goes into remission). If symptoms return, increased treatment may be needed.

## 2021-08-29 ENCOUNTER — Ambulatory Visit: Payer: Medicaid Other | Admitting: Internal Medicine

## 2021-08-29 LAB — CBC WITH DIFFERENTIAL/PLATELET
Basophils Absolute: 0.1 10*3/uL (ref 0.0–0.2)
Basos: 0 %
EOS (ABSOLUTE): 0.1 10*3/uL (ref 0.0–0.4)
Eos: 0 %
Hematocrit: 46.8 % — ABNORMAL HIGH (ref 34.0–46.6)
Hemoglobin: 17 g/dL — ABNORMAL HIGH (ref 11.1–15.9)
Immature Grans (Abs): 0.1 10*3/uL (ref 0.0–0.1)
Immature Granulocytes: 1 %
Lymphocytes Absolute: 2.6 10*3/uL (ref 0.7–3.1)
Lymphs: 15 %
MCH: 32.8 pg (ref 26.6–33.0)
MCHC: 36.3 g/dL — ABNORMAL HIGH (ref 31.5–35.7)
MCV: 90 fL (ref 79–97)
Monocytes Absolute: 0.7 10*3/uL (ref 0.1–0.9)
Monocytes: 4 %
Neutrophils Absolute: 13.6 10*3/uL — ABNORMAL HIGH (ref 1.4–7.0)
Neutrophils: 80 %
Platelets: 352 10*3/uL (ref 150–450)
RBC: 5.18 x10E6/uL (ref 3.77–5.28)
RDW: 12.4 % (ref 11.7–15.4)
WBC: 17.2 10*3/uL — ABNORMAL HIGH (ref 3.4–10.8)

## 2021-08-29 LAB — COMPREHENSIVE METABOLIC PANEL
ALT: 17 IU/L (ref 0–32)
AST: 13 IU/L (ref 0–40)
Albumin/Globulin Ratio: 1.8 (ref 1.2–2.2)
Albumin: 4.7 g/dL (ref 3.8–4.8)
Alkaline Phosphatase: 104 IU/L (ref 44–121)
BUN/Creatinine Ratio: 19 (ref 9–23)
BUN: 14 mg/dL (ref 6–24)
Bilirubin Total: 0.2 mg/dL (ref 0.0–1.2)
CO2: 16 mmol/L — ABNORMAL LOW (ref 20–29)
Calcium: 9.7 mg/dL (ref 8.7–10.2)
Chloride: 106 mmol/L (ref 96–106)
Creatinine, Ser: 0.75 mg/dL (ref 0.57–1.00)
Globulin, Total: 2.6 g/dL (ref 1.5–4.5)
Glucose: 95 mg/dL (ref 65–99)
Potassium: 4 mmol/L (ref 3.5–5.2)
Sodium: 138 mmol/L (ref 134–144)
Total Protein: 7.3 g/dL (ref 6.0–8.5)
eGFR: 102 mL/min/{1.73_m2} (ref >59)

## 2021-08-29 LAB — SARS-COV-2, NAA 2 DAY TAT

## 2021-08-29 LAB — NOVEL CORONAVIRUS, NAA: SARS-CoV-2, NAA: NOT DETECTED

## 2021-08-29 LAB — C-REACTIVE PROTEIN: CRP: <1 mg/L (ref 0–10)

## 2021-08-29 NOTE — ED Provider Notes (Signed)
[facility]   [id number] 08/28/21 Arrival Time: 1758  ASSESSMENT & PLAN:  1. Encounter for screening for COVID-19   2. Acute vasculitis (HCC)    No signs of cellulitis.  CBC/CMP/CRP pending. Will place back on prednisone. COVID-19 testing sent. OTC symptom care as needed.  Meds ordered this encounter  Medications   predniSONE (STERAPRED UNI-PAK 48 TAB) 10 MG (48) TBPK tablet    Sig: Take as directed.    Dispense:  48 tablet    Refill:  0   Recommend:  Follow-up Information     Heather Roberts, NP.   Specialty: Nurse Practitioner Why: If worsening or failing to improve as anticipated. Contact information: [address]  [address] [place] [address] [phone]                 Reviewed expectations re: course of current medical issues. Questions answered. Outlined signs and symptoms indicating need for more acute intervention. Understanding verbalized. After Visit Summary given.   SUBJECTIVE: History from: patient. Destiny Petty is a 40 y.o. female who preports continued rash on legs mainly; some on L arm. Still itching. Previous visit reviewed. Afebrile. Denies: difficulty breathing. Normal PO intake without n/v/d.   OBJECTIVE:  Vitals:   08/28/21 1909  BP: 129/65  Pulse: (!) 116  Resp: 18  Temp: 98.3 F (36.8 C)  TempSrc: Oral  SpO2: 97%    Slight tachycardia noted. 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   Anxiety    Arthritis 12/04/2016   Phreesia 05/23/2020   Asthma    Back pain with right-sided sciatica 06/18/2016    Bipolar disorder (HCC) 01/15/2016   Overview:  Tried Depakote   Body mass index 36.0-36.9, adult 04/10/2016   Body mass index 37.0-37.9, adult 03/13/2016   Depression    Depressive disorder, not elsewhere classified 03/27/2007   Dysmenorrhea 04/10/2016   Family history of adverse reaction to anesthesia    PONV   GERD (gastroesophageal reflux disease)    Hemorrhoids 02/10/2015   Hiatal hernia    Hyperlipidemia 04/09/2011   Hypertension    no meds   Injury of digital nerve of right little finger 08/23/2020   Laceration of blood vessel of right little finger 08/23/2020   Laceration of finger of left hand without damage to nail    left small finger   Menorrhagia with regular cycle 04/10/2016   Migraine 01/15/2016   Overview:  Overview:  IMO Load 2016 R1.3   Muscle spasm of back 03/13/2016   Numbness and tingling in left hand 08/19/2016   Obesity    Ovarian cyst 12/18/2016   Perianal wart 02/10/2015   Stress 02/14/2016   Weight loss counseling, encounter for 03/13/2016   Social History   Socioeconomic History   Marital status: Legally Separated    Spouse name: Not on file   Number of children: 2   Years of education: 14   Highest education level: Not on file  Occupational History   Not on file  Tobacco Use   Smoking status: Every Day  Packs/day: 1.00    Years: 26.00    Pack years: 26.00    Types: Cigarettes   Smokeless tobacco: Never   Tobacco comments:    1/4 ppd 05/30/21  Vaping Use   Vaping Use: Never used  Substance and Sexual Activity   Alcohol use: Yes    Comment: occ   Drug use: No   Sexual activity: Yes    Birth control/protection: Surgical    Comment: tubal  Other Topics Concern   Not on file  Social History Narrative   Legally separated right now   2 children: son 11 and daughter 65 lives with her      2 dogs: macho, mary jane   2 birds:    1 cats: boghgie       Enjoys: spending time with kids, outside things      Diet: eat all food groups    Caffeine: coffee and soda  daily   Water: 4 cups daily      Wears seat belt   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   No weapons        Social Determinants of Health   Financial Resource Strain: Not on file  Food Insecurity: Not on file  Transportation Needs: Not on file  Physical Activity: Not on file  Stress: Not on file  Social Connections: Not on file  Intimate Partner Violence: Not on file   Family History  Problem Relation Age of Onset   Diabetes Mother    Kidney failure Mother    Early death Mother 6       diabetes complications   Cancer Father 35       colon cancer   Depression Sister    Hypertension Sister    Anxiety disorder Sister    Other Sister        back problems   ADD / ADHD Daughter    Other Daughter        behavioral issues   ODD Daughter    Asthma Son    Cancer Maternal Grandmother        breast   Seizures Maternal Grandmother    Lupus Other    Breast cancer Maternal Aunt    Past Surgical History:  Procedure Laterality Date   ABLATION     CESAREAN SECTION     CESAREAN SECTION N/A    Phreesia 05/23/2020   CHOLECYSTECTOMY     COLONOSCOPY     DILATATION AND CURETTAGE/HYSTEROSCOPY WITH MINERVA N/A 08/31/2019   Procedure: DILATATION AND CURETTAGE (no specimen) /HYSTEROSCOPY WITH MINERVA ENDOMETRIAL ABLATION;  Surgeon: Tilda Burrow, MD;  Location: [facility];  Service: Gynecology;  Laterality: N/A;   ESOPHAGOGASTRODUODENOSCOPY ENDOSCOPY     TENDON EXPLORATION Left 08/10/2020   Procedure: LEFT SMALL FINER EXPLORATION, REPAIR OF TENDON, ARTERY, NERVE;  Surgeon: Betha Loa, MD;  Location: [facility];  Service: Orthopedics;  Laterality: Left;   TUBAL LIGATION       Mardella Layman, MD 08/29/21 [phone]

## 2021-09-07 ENCOUNTER — Ambulatory Visit (INDEPENDENT_AMBULATORY_CARE_PROVIDER_SITE_OTHER): Payer: Medicaid Other | Admitting: Nurse Practitioner

## 2021-09-07 ENCOUNTER — Other Ambulatory Visit: Payer: Self-pay

## 2021-09-07 ENCOUNTER — Encounter: Payer: Self-pay | Admitting: Nurse Practitioner

## 2021-09-07 VITALS — BP 122/80 | HR 106 | Temp 98.0°F | Ht 60.0 in | Wt 216.0 lb

## 2021-09-07 DIAGNOSIS — J3089 Other allergic rhinitis: Secondary | ICD-10-CM | POA: Diagnosis not present

## 2021-09-07 DIAGNOSIS — E785 Hyperlipidemia, unspecified: Secondary | ICD-10-CM

## 2021-09-07 DIAGNOSIS — E6609 Other obesity due to excess calories: Secondary | ICD-10-CM | POA: Diagnosis not present

## 2021-09-07 DIAGNOSIS — Z139 Encounter for screening, unspecified: Secondary | ICD-10-CM

## 2021-09-07 DIAGNOSIS — J4530 Mild persistent asthma, uncomplicated: Secondary | ICD-10-CM

## 2021-09-07 DIAGNOSIS — Z6838 Body mass index (BMI) 38.0-38.9, adult: Secondary | ICD-10-CM

## 2021-09-07 DIAGNOSIS — I10 Essential (primary) hypertension: Secondary | ICD-10-CM

## 2021-09-07 DIAGNOSIS — D72829 Elevated white blood cell count, unspecified: Secondary | ICD-10-CM | POA: Diagnosis not present

## 2021-09-07 DIAGNOSIS — F17218 Nicotine dependence, cigarettes, with other nicotine-induced disorders: Secondary | ICD-10-CM

## 2021-09-07 DIAGNOSIS — K219 Gastro-esophageal reflux disease without esophagitis: Secondary | ICD-10-CM

## 2021-09-07 MED ORDER — AMLODIPINE BESYLATE 5 MG PO TABS: 5.0000 mg | ORAL_TABLET | Freq: Every day | ORAL | 3 refills | Status: DC

## 2021-09-07 MED ORDER — STIOLTO RESPIMAT 2.5-2.5 MCG/ACT IN AERS: 2.0000 | INHALATION_SPRAY | Freq: Every day | RESPIRATORY_TRACT | 11 refills | Status: DC

## 2021-09-07 MED ORDER — ALBUTEROL SULFATE HFA 108 (90 BASE) MCG/ACT IN AERS: INHALATION_SPRAY | RESPIRATORY_TRACT | 0 refills | Status: DC

## 2021-09-07 MED ORDER — FAMOTIDINE 20 MG PO TABS: ORAL_TABLET | ORAL | 11 refills | Status: DC

## 2021-09-07 MED ORDER — BUPROPION HCL ER (XL) 300 MG PO TB24: 300.0000 mg | ORAL_TABLET | Freq: Every day | ORAL | 1 refills | Status: DC

## 2021-09-07 MED ORDER — PHENTERMINE HCL 15 MG PO CAPS: 15.0000 mg | ORAL_CAPSULE | ORAL | 0 refills | Status: DC

## 2021-09-07 MED ORDER — TOPIRAMATE 100 MG PO TABS: ORAL_TABLET | ORAL | 3 refills | Status: DC

## 2021-09-07 NOTE — Assessment & Plan Note (Signed)
-  checking labs 

## 2021-09-07 NOTE — Patient Instructions (Signed)
Please have labs drawn today. Need to recheck your white blood cell count and complete screenings.
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
## 2021-09-07 NOTE — Assessment & Plan Note (Signed)
-  Rx. Phentermine (low-dose, HR near tachycardia) -had weight gain, likely related to prednisone use -med check in 1 month

## 2021-09-07 NOTE — Assessment & Plan Note (Signed)
-  well controlled today -refilled her meds

## 2021-09-08 LAB — CBC WITH DIFFERENTIAL/PLATELET
Basophils Absolute: 0.1 10*3/uL (ref 0.0–0.2)
Basos: 1 %
EOS (ABSOLUTE): 0.3 10*3/uL (ref 0.0–0.4)
Eos: 3 %
Hematocrit: 42 % (ref 34.0–46.6)
Hemoglobin: 14.4 g/dL (ref 11.1–15.9)
Immature Grans (Abs): 0.1 10*3/uL (ref 0.0–0.1)
Immature Granulocytes: 1 %
Lymphocytes Absolute: 4.3 10*3/uL — ABNORMAL HIGH (ref 0.7–3.1)
Lymphs: 35 %
MCH: 32.1 pg (ref 26.6–33.0)
MCHC: 34.3 g/dL (ref 31.5–35.7)
MCV: 94 fL (ref 79–97)
Monocytes Absolute: 0.8 10*3/uL (ref 0.1–0.9)
Monocytes: 7 %
Neutrophils Absolute: 6.6 10*3/uL (ref 1.4–7.0)
Neutrophils: 53 %
Platelets: 268 10*3/uL (ref 150–450)
RBC: 4.48 x10E6/uL (ref 3.77–5.28)
RDW: 12 % (ref 11.7–15.4)
WBC: 12.2 10*3/uL — ABNORMAL HIGH (ref 3.4–10.8)

## 2021-09-08 LAB — LIPID PANEL WITH LDL/HDL RATIO
Cholesterol, Total: 202 mg/dL — ABNORMAL HIGH (ref 100–199)
HDL: 36 mg/dL — ABNORMAL LOW (ref >39)
LDL Chol Calc (NIH): 80 mg/dL (ref 0–99)
LDL/HDL Ratio: 2.2 ratio (ref 0.0–3.2)
Triglycerides: 540 mg/dL — ABNORMAL HIGH (ref 0–149)
VLDL Cholesterol Cal: 86 mg/dL — ABNORMAL HIGH (ref 5–40)

## 2021-09-08 LAB — HEPATITIS C ANTIBODY: Hep C Virus Ab: <0.1 s/co ratio (ref 0.0–0.9)

## 2021-09-10 ENCOUNTER — Other Ambulatory Visit: Payer: Self-pay | Admitting: Nurse Practitioner

## 2021-09-10 MED ORDER — FISH OIL 1000 MG PO CAPS: 3000.0000 mg | ORAL_CAPSULE | Freq: Every day | ORAL | 3 refills | Status: DC

## 2021-09-10 MED ORDER — FENOFIBRATE 145 MG PO TABS: 145.0000 mg | ORAL_TABLET | Freq: Every day | ORAL | 3 refills | Status: DC

## 2021-09-10 NOTE — Progress Notes (Signed)
WBC are trending back down. Triglycerides are significantly elevated. I called in fish oil capsules and fenofibrate.

## 2021-10-24 ENCOUNTER — Ambulatory Visit
Admission: EM | Admit: 2021-10-24 | Discharge: 2021-10-24 | Disposition: A | Discharge status: Home / Self Care | Payer: Medicaid Other | Attending: Family Medicine | Admitting: Family Medicine

## 2021-10-24 ENCOUNTER — Other Ambulatory Visit: Payer: Self-pay

## 2021-10-24 DIAGNOSIS — N898 Other specified noninflammatory disorders of vagina: Secondary | ICD-10-CM | POA: Diagnosis not present

## 2021-10-24 DIAGNOSIS — Z113 Encounter for screening for infections with a predominantly sexual mode of transmission: Secondary | ICD-10-CM | POA: Diagnosis not present

## 2021-10-24 DIAGNOSIS — J0100 Acute maxillary sinusitis, unspecified: Secondary | ICD-10-CM | POA: Diagnosis not present

## 2021-10-24 MED ORDER — CEFDINIR 300 MG PO CAPS: 300.0000 mg | ORAL_CAPSULE | Freq: Two times a day (BID) | ORAL | 0 refills | Status: DC

## 2021-10-24 MED ORDER — PREDNISONE 20 MG PO TABS: 40.0000 mg | ORAL_TABLET | Freq: Every day | ORAL | 0 refills | Status: DC

## 2021-10-24 MED ORDER — METRONIDAZOLE 500 MG PO TABS: 2000.0000 mg | ORAL_TABLET | Freq: Once | ORAL | 0 refills | Status: AC

## 2021-10-24 MED ORDER — FLUTICASONE PROPIONATE 50 MCG/ACT NA SUSP: 2.0000 | Freq: Every day | NASAL | 2 refills | Status: DC

## 2021-10-24 NOTE — Discharge Instructions (Signed)
We have sent testing for sexually transmitted infections. We will notify you of any positive results once they are received. If required, we will prescribe any medications you might need.  Please refrain from all sexual activity for at least the next seven days.  

## 2021-10-24 NOTE — ED Triage Notes (Signed)
Pt reports cough, nasal congestion, headache, pressure behind ears x 2 weeks.   Pt reports white vaginal discharge x 2 weeks. Pt requested STD's test

## 2021-10-24 NOTE — ED Provider Notes (Signed)
[facility] CARE CENTER   [id number] 10/24/21 Arrival Time: 1717  ASSESSMENT & PLAN:  1. Vaginal discharge   2. Acute non-recurrent maxillary sinusitis   3. Screening for STDs (sexually transmitted diseases)    Tx for sinusitis and trich. Vag cytology pending.  Meds ordered this encounter  Medications   predniSONE (DELTASONE) 20 MG tablet    Sig: Take 2 tablets (40 mg total) by mouth daily.    Dispense:  10 tablet    Refill:  0   cefdinir (OMNICEF) 300 MG capsule    Sig: Take 1 capsule (300 mg total) by mouth 2 (two) times daily.    Dispense:  20 capsule    Refill:  0   fluticasone (FLONASE) 50 MCG/ACT nasal spray    Sig: Place 2 sprays into both nostrils daily.    Dispense:  16 g    Refill:  2   metroNIDAZOLE (FLAGYL) 500 MG tablet    Sig: Take 4 tablets (2,000 mg total) by mouth once for 1 dose.    Dispense:  4 tablet    Refill:  0    Discussed typical duration of symptoms. OTC symptom care as needed. Ensure adequate fluid intake and rest.   Follow-up Information     Heather Roberts, NP.   Specialty: Nurse Practitioner Why: If worsening or failing to improve as anticipated. Contact information: [address]  [address] [place] [address] [phone]                 Reviewed expectations re: course of current medical issues. Questions answered. Outlined signs and symptoms indicating need for more acute intervention. Patient verbalized understanding. After Visit Summary given.   SUBJECTIVE: History from: patient.  Destiny Petty is a 40 y.o. female who presents with complaint of nasal congestion, post-nasal drainage, and sinus pain. Onset gradual, 2 w ago Respiratory symptoms: none. Fever: absent. Overall normal PO intake without n/v. OTC treatment: without help. History of frequent sinus infections: "occasioanl". No specific aggravating or alleviating factors reported.  Social History   Tobacco Use  Smoking Status Every Day    Packs/day: 1.00   Years: 26.00   Pack years: 26.00   Types: Cigarettes  Smokeless Tobacco Never  Tobacco Comments   1/4 ppd 05/30/21   Also req STD testing. H/O trich; with slight current vag d/c.  OBJECTIVE:  Vitals:   10/24/21 1750  BP: (!) 138/91  Pulse: 98  Resp: 19  Temp: 99.1 F (37.3 C)  TempSrc: Oral  SpO2: 97%     General appearance: alert; no distress HEENT: nasal congestion; clear runny nose; throat irritation secondary to post-nasal drainage; bilateral maxillary tenderness to palpation; turbinates boggy Neck: supple without LAD; trachea midline Lungs: unlabored respirations, symmetrical air entry; cough: absent; no respiratory distress Skin: warm and dry GU: deferred Psychological: alert and cooperative; normal mood and affect  Allergies  Allergen Reactions   Bee Venom Shortness Of Breath   Latex Itching   Lisinopril Cough    coughing    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   Anxiety    Arthritis 12/04/2016   Phreesia 05/23/2020   Asthma    Back pain with right-sided sciatica 06/18/2016   Bipolar disorder (HCC) 01/15/2016   Overview:  Tried Depakote   Body mass index 36.0-36.9, adult 04/10/2016   Body mass index 37.0-37.9, adult 03/13/2016   Depression    Depressive disorder, not  elsewhere classified 03/27/2007   Dysmenorrhea 04/10/2016   Family history of adverse reaction to anesthesia    PONV   GERD (gastroesophageal reflux disease)    Hemorrhoids 02/10/2015   Hiatal hernia    Hyperlipidemia 04/09/2011   Hypertension    no meds   Injury of digital nerve of right little finger 08/23/2020   Laceration of blood vessel of right little finger 08/23/2020   Laceration of finger of left hand without damage to nail    left small finger   Menorrhagia with regular cycle 04/10/2016   Migraine 01/15/2016   Overview:  Overview:  IMO Load 2016 R1.3   Muscle spasm of back 03/13/2016   Numbness and tingling in left  hand 08/19/2016   Obesity    Ovarian cyst 12/18/2016   Perianal wart 02/10/2015   Stress 02/14/2016   Weight loss counseling, encounter for 03/13/2016   Family History  Problem Relation Age of Onset   Diabetes Mother    Kidney failure Mother    Early death Mother 4       diabetes complications   Cancer Father 36       colon cancer   Depression Sister    Hypertension Sister    Anxiety disorder Sister    Other Sister        back problems   ADD / ADHD Daughter    Other Daughter        behavioral issues   ODD Daughter    Asthma Son    Cancer Maternal Grandmother        breast   Seizures Maternal Grandmother    Lupus Other    Breast cancer Maternal Aunt    Social History   Socioeconomic History   Marital status: Legally Separated    Spouse name: Not on file   Number of children: 2   Years of education: 14   Highest education level: Not on file  Occupational History   Not on file  Tobacco Use   Smoking status: Every Day    Packs/day: 1.00    Years: 26.00    Pack years: 26.00    Types: Cigarettes   Smokeless tobacco: Never   Tobacco comments:    1/4 ppd 05/30/21  Vaping Use   Vaping Use: Never used  Substance and Sexual Activity   Alcohol use: Yes    Comment: occ   Drug use: No   Sexual activity: Yes    Birth control/protection: Surgical    Comment: tubal  Other Topics Concern   Not on file  Social History Narrative   Legally separated right now   2 children: son 34 and daughter 4 lives with her      2 dogs: macho, mary jane   2 birds:    1 cats: boghgie       Enjoys: spending time with kids, outside things      Diet: eat all food groups    Caffeine: coffee and soda daily   Water: 4 cups daily      Wears seat belt   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   No weapons        Social Determinants of Health   Financial Resource Strain: Not on file  Food Insecurity: Not on file  Transportation Needs: Not on file  Physical Activity: Not  on file  Stress: Not on file  Social Connections: Not on file  Intimate Partner Violence: Not on file  Mardella Layman, MD 10/24/21 [phone]

## 2021-10-25 LAB — CERVICOVAGINAL ANCILLARY ONLY
Bacterial Vaginitis (gardnerella): POSITIVE — AB
Candida Glabrata: NEGATIVE
Candida Vaginitis: POSITIVE — AB
Chlamydia: NEGATIVE
Comment: NEGATIVE
Comment: NEGATIVE
Comment: NEGATIVE
Comment: NEGATIVE
Comment: NEGATIVE
Comment: NORMAL
Neisseria Gonorrhea: NEGATIVE
Trichomonas: POSITIVE — AB

## 2021-10-26 ENCOUNTER — Telehealth (HOSPITAL_COMMUNITY): Payer: Self-pay | Admitting: Emergency Medicine

## 2021-10-26 MED ORDER — FLUCONAZOLE 150 MG PO TABS: 150.0000 mg | ORAL_TABLET | Freq: Once | ORAL | 0 refills | Status: DC

## 2021-10-26 MED ORDER — FLUCONAZOLE 150 MG PO TABS: 150.0000 mg | ORAL_TABLET | Freq: Once | ORAL | 0 refills | Status: AC

## 2021-10-26 NOTE — Telephone Encounter (Signed)
Patient preferred different pharmacy, prescription resent

## 2021-10-30 ENCOUNTER — Telehealth: Payer: Self-pay

## 2021-10-30 ENCOUNTER — Other Ambulatory Visit: Payer: Self-pay

## 2021-10-30 DIAGNOSIS — K219 Gastro-esophageal reflux disease without esophagitis: Secondary | ICD-10-CM

## 2021-10-30 DIAGNOSIS — I10 Essential (primary) hypertension: Secondary | ICD-10-CM

## 2021-10-30 MED ORDER — HYDROCHLOROTHIAZIDE 12.5 MG PO CAPS: ORAL_CAPSULE | ORAL | 2 refills | Status: DC

## 2021-10-30 MED ORDER — FISH OIL 1000 MG PO CAPS: 3000.0000 mg | ORAL_CAPSULE | Freq: Every day | ORAL | 3 refills | Status: DC

## 2021-10-30 MED ORDER — FAMOTIDINE 20 MG PO TABS: ORAL_TABLET | ORAL | 11 refills | Status: DC

## 2021-10-30 MED ORDER — RIZATRIPTAN BENZOATE 10 MG PO TBDP: 10.0000 mg | ORAL_TABLET | ORAL | 0 refills | Status: DC | PRN

## 2021-10-30 NOTE — Telephone Encounter (Signed)
Patient called need med refills  rizatriptan (MAXALT-MLT) 10 MG   Omega-3 Fatty Acids (FISH OIL) 1000 MG CAPS  famotidine (PEPCID) 20 MG tablet  hydrochlorothiazide (MICROZIDE) 12.5 MG capsule   phentermine 15 MG capsule  [facility]

## 2021-10-31 NOTE — Telephone Encounter (Signed)
Pt states understanding

## 2021-11-07 ENCOUNTER — Ambulatory Visit
Admission: EM | Admit: 2021-11-07 | Discharge: 2021-11-07 | Disposition: A | Discharge status: Home / Self Care | Payer: Medicaid Other | Attending: Family Medicine | Admitting: Family Medicine

## 2021-11-07 ENCOUNTER — Other Ambulatory Visit: Payer: Self-pay

## 2021-11-07 ENCOUNTER — Encounter: Payer: Self-pay | Admitting: Emergency Medicine

## 2021-11-07 DIAGNOSIS — J0101 Acute recurrent maxillary sinusitis: Secondary | ICD-10-CM | POA: Diagnosis not present

## 2021-11-07 MED ORDER — PREDNISONE 10 MG (48) PO TBPK: ORAL_TABLET | ORAL | 0 refills | Status: DC

## 2021-11-07 MED ORDER — LEVOFLOXACIN 750 MG PO TABS: 750.0000 mg | ORAL_TABLET | Freq: Every day | ORAL | 0 refills | Status: DC

## 2021-11-07 NOTE — ED Provider Notes (Signed)
[facility] CARE CENTER   [id number] 11/07/21 Arrival Time: 0810  ASSESSMENT & PLAN:  1. Acute recurrent maxillary sinusitis    Will tx for recurrent sinus infection. Recommend ENT should she not improve. Voices understanding.  Meds ordered this encounter  Medications   levofloxacin (LEVAQUIN) 750 MG tablet    Sig: Take 1 tablet (750 mg total) by mouth daily.    Dispense:  7 tablet    Refill:  0   predniSONE (STERAPRED UNI-PAK 48 TAB) 10 MG (48) TBPK tablet    Sig: Take as directed.    Dispense:  48 tablet    Refill:  0    Follow-up Information     Heather Roberts, NP.   Specialty: Nurse Practitioner Why: If worsening or failing to improve as anticipated. Contact information: [address]  [address] [place] [address] [phone]                 Reviewed expectations re: course of current medical issues. Questions answered. Outlined signs and symptoms indicating need for more acute intervention. Patient verbalized understanding. After Visit Summary given.   SUBJECTIVE: History from: patient.  Destiny Petty is a 40 y.o. female who presents with complaint of recurrent nasal congestion and sinus pressure. Treated 1.5 w ago with Omnicef/prednisone. Was improving "but now it's all back". Maxillary sinus pressure. Afebrile.  Social History   Tobacco Use  Smoking Status Every Day   Packs/day: 1.00   Years: 26.00   Pack years: 26.00   Types: Cigarettes  Smokeless Tobacco Never  Tobacco Comments   1/4 ppd 05/30/21    OBJECTIVE:  Vitals:   11/07/21 0817  BP: 139/90  Pulse: 97  Resp: 16  Temp: 97.6 F (36.4 C)  TempSrc: Oral  SpO2: 97%     General appearance: alert; no distress HEENT: nasal congestion; clear runny nose; throat irritation secondary to post-nasal drainage; bilateral maxillary tenderness to palpation; turbinates boggy Neck: supple without LAD; trachea midline Lungs: unlabored respirations, symmetrical air entry;  cough: absent; no respiratory distress Skin: warm and dry Psychological: alert and cooperative; normal mood and affect  Allergies  Allergen Reactions   Bee Venom Shortness Of Breath   Latex Itching   Lisinopril Cough    coughing    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   Anxiety    Arthritis 12/04/2016   Phreesia 05/23/2020   Asthma    Back pain with right-sided sciatica 06/18/2016   Bipolar disorder (HCC) 01/15/2016   Overview:  Tried Depakote   Body mass index 36.0-36.9, adult 04/10/2016   Body mass index 37.0-37.9, adult 03/13/2016   Depression    Depressive disorder, not elsewhere classified 03/27/2007   Dysmenorrhea 04/10/2016   Family history of adverse reaction to anesthesia    PONV   GERD (gastroesophageal reflux disease)    Hemorrhoids 02/10/2015   Hiatal hernia    Hyperlipidemia 04/09/2011   Hypertension    no meds   Injury of digital nerve of right little finger 08/23/2020   Laceration of blood vessel of right little finger 08/23/2020   Laceration of finger of left hand without damage to nail    left small finger   Menorrhagia with regular cycle 04/10/2016   Migraine 01/15/2016   Overview:  Overview:  IMO Load 2016 R1.3   Muscle spasm of back 03/13/2016   Numbness and tingling in left hand 08/19/2016   Obesity  Ovarian cyst 12/18/2016   Perianal wart 02/10/2015   Stress 02/14/2016   Weight loss counseling, encounter for 03/13/2016   Family History  Problem Relation Age of Onset   Diabetes Mother    Kidney failure Mother    Early death Mother 14       diabetes complications   Cancer Father 35       colon cancer   Depression Sister    Hypertension Sister    Anxiety disorder Sister    Other Sister        back problems   ADD / ADHD Daughter    Other Daughter        behavioral issues   ODD Daughter    Asthma Son    Cancer Maternal Grandmother        breast   Seizures Maternal Grandmother    Lupus Other    Breast  cancer Maternal Aunt    Social History   Socioeconomic History   Marital status: Legally Separated    Spouse name: Not on file   Number of children: 2   Years of education: 14   Highest education level: Not on file  Occupational History   Not on file  Tobacco Use   Smoking status: Every Day    Packs/day: 1.00    Years: 26.00    Pack years: 26.00    Types: Cigarettes   Smokeless tobacco: Never   Tobacco comments:    1/4 ppd 05/30/21  Vaping Use   Vaping Use: Never used  Substance and Sexual Activity   Alcohol use: Yes    Comment: occ   Drug use: No   Sexual activity: Yes    Birth control/protection: Surgical    Comment: tubal  Other Topics Concern   Not on file  Social History Narrative   Legally separated right now   2 children: son 72 and daughter 64 lives with her      2 dogs: macho, mary jane   2 birds:    1 cats: boghgie       Enjoys: spending time with kids, outside things      Diet: eat all food groups    Caffeine: coffee and soda daily   Water: 4 cups daily      Wears seat belt   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

## 2021-11-07 NOTE — ED Triage Notes (Signed)
Patient c/o sinus pressure and nasal congestion x 1 week.   Patient endorses being treated for sinus infection earlier this month.   Patient endorses bilateral ear pressure.   Patient endorses being prescribed prednisone and an antibiotics "which helped and then the infection came back".

## 2021-11-13 ENCOUNTER — Encounter: Payer: Medicaid Other | Admitting: Nurse Practitioner

## 2021-11-16 ENCOUNTER — Encounter (HOSPITAL_COMMUNITY): Payer: Self-pay

## 2021-11-16 ENCOUNTER — Emergency Department (HOSPITAL_COMMUNITY)
Admission: EM | Admit: 2021-11-16 | Discharge: 2021-11-16 | Disposition: A | Discharge status: Home / Self Care | Payer: Medicaid Other | Attending: Emergency Medicine | Admitting: Emergency Medicine

## 2021-11-16 ENCOUNTER — Other Ambulatory Visit: Payer: Self-pay

## 2021-11-16 DIAGNOSIS — J45909 Unspecified asthma, uncomplicated: Secondary | ICD-10-CM | POA: Insufficient documentation

## 2021-11-16 DIAGNOSIS — F17210 Nicotine dependence, cigarettes, uncomplicated: Secondary | ICD-10-CM | POA: Insufficient documentation

## 2021-11-16 DIAGNOSIS — I10 Essential (primary) hypertension: Secondary | ICD-10-CM | POA: Insufficient documentation

## 2021-11-16 DIAGNOSIS — K645 Perianal venous thrombosis: Secondary | ICD-10-CM | POA: Insufficient documentation

## 2021-11-16 DIAGNOSIS — K6289 Other specified diseases of anus and rectum: Secondary | ICD-10-CM | POA: Diagnosis present

## 2021-11-16 MED ORDER — OXYCODONE-ACETAMINOPHEN 5-325 MG PO TABS
1.0000 | ORAL_TABLET | Freq: Once | ORAL | Status: AC
  Administered 2021-11-16: 1 via ORAL
  Filled 2021-11-16: qty 1

## 2021-11-16 MED ORDER — OXYCODONE-ACETAMINOPHEN 5-325 MG PO TABS: 1.0000 | ORAL_TABLET | Freq: Four times a day (QID) | ORAL | 0 refills | Status: DC | PRN

## 2021-11-16 MED ORDER — LIDOCAINE HCL (PF) 1 % IJ SOLN
30.0000 mL | Freq: Once | INTRAMUSCULAR | Status: AC
  Administered 2021-11-16: 30 mL
  Filled 2021-11-16: qty 30

## 2021-11-16 MED ORDER — OXYCODONE HCL 5 MG PO TABS: 5.0000 mg | ORAL_TABLET | ORAL | 0 refills | Status: DC | PRN

## 2021-11-16 NOTE — Discharge Instructions (Addendum)
You were evaluated in the Emergency Department and after careful evaluation, we did not find any emergent condition requiring admission or further testing in the hospital.  Your exam/testing today was overall reassuring.  We performed an incision and drainage of your thrombosed hemorrhoid here in the emergency department.  This area may bleed or drain a bit for the next 2 days.  Continue Preparation H, recommend an over-the-counter stool softener such as MiraLAX.  Recommend follow-up with your general surgeon if symptoms continue.  Please return to the Emergency Department if you experience any worsening of your condition.  Thank you for allowing [place] to be a part of your care.

## 2021-11-16 NOTE — ED Triage Notes (Signed)
Pt c/o hemorrhoids. Says she had surgery for same 2 years ago. Pt states has been in pain for the last 3 days and is unable to sit.

## 2021-11-16 NOTE — ED Provider Notes (Signed)
[facility] Emergency Department Provider Note MRN:  [id number]  Arrival date & time: 11/16/21     Chief Complaint   Hemorrhoids   History of Present Illness   Destiny Petty is a 40 y.o. year-old female with a history of hemorrhoids presenting to the ED with chief complaint of hemorrhoids.  Patient has been having a painful rectum for about 3 days.  Has been traveling a lot and sitting a lot and thinks that it has irritated her hemorrhoids.  She can no longer sit flat, has been trying a donut pillow, has been trying Preparation H, nothing is helping.  Pain is severe, she is not getting any sleep, here for help.  Denies fever, no other complaints.  Review of Systems  A problem-focused ROS was performed. Positive for rectal pain.  Patient denies fever.  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   Anxiety    Arthritis 12/04/2016   Phreesia 05/23/2020   Asthma    Back pain with right-sided sciatica 06/18/2016   Bipolar disorder (HCC) 01/15/2016   Overview:  Tried Depakote   Body mass index 36.0-36.9, adult 04/10/2016   Body mass index 37.0-37.9, adult 03/13/2016   Depression    Depressive disorder, not elsewhere classified 03/27/2007   Dysmenorrhea 04/10/2016   Family history of adverse reaction to anesthesia    PONV   GERD (gastroesophageal reflux disease)    Hemorrhoids 02/10/2015   Hiatal hernia    Hyperlipidemia 04/09/2011   Hypertension    no meds   Injury of digital nerve of right little finger 08/23/2020   Laceration of blood vessel of right little finger 08/23/2020   Laceration of finger of left hand without damage to nail    left small finger   Menorrhagia with regular cycle 04/10/2016   Migraine 01/15/2016   Overview:  Overview:  IMO Load 2016 R1.3   Muscle spasm of back 03/13/2016   Numbness and tingling in left hand 08/19/2016   Obesity    Ovarian cyst 12/18/2016    Perianal wart 02/10/2015   Stress 02/14/2016   Weight loss counseling, encounter for 03/13/2016    Past Surgical History:  Procedure Laterality Date   ABLATION     CESAREAN SECTION     CESAREAN SECTION N/A    Phreesia 05/23/2020   CHOLECYSTECTOMY     COLONOSCOPY     DILATATION AND CURETTAGE/HYSTEROSCOPY WITH MINERVA N/A 08/31/2019   Procedure: DILATATION AND CURETTAGE (no specimen) /HYSTEROSCOPY WITH MINERVA ENDOMETRIAL ABLATION;  Surgeon: Tilda Burrow, MD;  Location: [facility];  Service: Gynecology;  Laterality: N/A;   ESOPHAGOGASTRODUODENOSCOPY ENDOSCOPY     TENDON EXPLORATION Left 08/10/2020   Procedure: LEFT SMALL FINER EXPLORATION, REPAIR OF TENDON, ARTERY, NERVE;  Surgeon: Betha Loa, MD;  Location: [facility];  Service: Orthopedics;  Laterality: Left;   TUBAL LIGATION      Family History  Problem Relation Age of Onset   Diabetes Mother    Kidney failure Mother    Early death Mother 73       diabetes complications   Cancer Father 78       colon cancer   Depression Sister    Hypertension Sister    Anxiety disorder Sister    Other Sister        back problems   ADD / ADHD Daughter    Other Daughter  behavioral issues   ODD Daughter    Asthma Son    Cancer Maternal Grandmother        breast   Seizures Maternal Grandmother    Lupus Other    Breast cancer Maternal Aunt     Social History   Socioeconomic History   Marital status: Legally Separated    Spouse name: Not on file   Number of children: 2   Years of education: 14   Highest education level: Not on file  Occupational History   Not on file  Tobacco Use   Smoking status: Every Day    Packs/day: 1.00    Years: 26.00    Pack years: 26.00    Types: Cigarettes   Smokeless tobacco: Never   Tobacco comments:    1/4 ppd 05/30/21  Vaping Use   Vaping Use: Never used  Substance and Sexual Activity   Alcohol use: Yes    Comment: occ   Drug use: No   Sexual activity: Yes    Birth  control/protection: Surgical    Comment: tubal  Other Topics Concern   Not on file  Social History Narrative   Legally separated right now   2 children: son 34 and daughter 54 lives with her      2 dogs: macho, mary jane   2 birds:    1 cats: boghgie       Enjoys: spending time with kids, outside things      Diet: eat all food groups    Caffeine: coffee and soda daily   Water: 4 cups daily      Wears seat belt   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   No weapons        Social Determinants of Health   Financial Resource Strain: Not on file  Food Insecurity: Not on file  Transportation Needs: Not on file  Physical Activity: Not on file  Stress: Not on file  Social Connections: Not on file  Intimate Partner Violence: Not on file     Physical Exam   Vitals:   11/16/21 0200 11/16/21 0319  BP: (!) 139/94 127/87  Pulse: 88 78  Resp: 18 18  Temp:  98.3 F (36.8 C)  SpO2: 97% 100%    CONSTITUTIONAL: Well-appearing, NAD NEURO:  Alert and oriented x 3, no focal deficits EYES:  eyes equal and reactive ENT/NECK:  no LAD, no JVD CARDIO: Regular rate, well-perfused, normal S1 and S2 PULM:  CTAB no wheezing or rhonchi GI/GU:  normal bowel sounds, non-distended, non-tender; large thrombosed hemorrhoid at the 12 o'clock position of the anus MSK/SPINE:  No gross deformities, no edema SKIN:  no rash, atraumatic PSYCH:  Appropriate speech and behavior  *Additional and/or pertinent findings included in MDM below  Diagnostic and Interventional Summary    EKG Interpretation  Date/Time:    Ventricular Rate:    PR Interval:    QRS Duration:   QT Interval:    QTC Calculation:   R Axis:     Text Interpretation:         Labs Reviewed - No data to display  No orders to display    Medications  lidocaine (PF) (XYLOCAINE) 1 % injection 30 mL (30 mLs Infiltration Given 11/16/21 0310)     Procedures  /  Critical Care .Marland KitchenIncision and Drainage  Date/Time:  11/16/2021 3:24 AM Performed by: Sabas Sous, MD Authorized by: Sabas Sous, MD   Consent:  Consent obtained:  Verbal   Consent given by:  Patient   Risks, benefits, and alternatives were discussed: yes     Risks discussed:  Bleeding, damage to other organs, infection, incomplete drainage and pain   Alternatives discussed:  Alternative treatment Universal protocol:    Procedure explained and questions answered to patient or proxy's satisfaction: yes     Immediately prior to procedure, a time out was called: yes     Patient identity confirmed:  Verbally with patient Location:    Type:  External thrombosed hemorrhoid   Size:  2.5cm   Location:  Anogenital   Anogenital location:  Perianal Pre-procedure details:    Skin preparation:  Chlorhexidine with alcohol Sedation:    Sedation type:  None Anesthesia:    Anesthesia method:  Local infiltration   Local anesthetic:  Lidocaine 1% w/o epi Procedure type:    Complexity:  Complex Procedure details:    Incision types:  Single straight   Incision depth:  Submucosal   Wound management:  Probed and deloculated, irrigated with saline and debrided   Drainage:  Bloody (Clotted blood removed)   Drainage amount:  Moderate   Wound treatment:  Wound left open   Packing materials:  None Post-procedure details:    Procedure completion:  Tolerated well, no immediate complications  ED Course and Medical Decision Making  I have reviewed the triage vital signs, the nursing notes, and pertinent available records from the EMR.  Listed above are laboratory and imaging tests that I personally ordered, reviewed, and interpreted and then considered in my medical decision making (see below for details).  Thrombosed external hemorrhoid evident on exam.  Firm to the touch, quite tender.  We discussed management options, patient was made aware of the procedural risks of incision and drainage of the area, we discussed other medical options and  general surgery referral.  She is desperate for relief, to her credit has been trying to manage it for several days appropriately, would like the procedure.  Procedure went well, as described above, appropriate for discharge.       Elmer Sow. Pilar Plate, MD [facility] Emergency Medicine [facility] [email or web address]  Final Clinical Impressions(s) / ED Diagnoses     ICD-10-CM   1. External thrombosed hemorrhoids  K64.5       ED Discharge Orders     None        Discharge Instructions Discussed with and Provided to Patient:    Discharge Instructions      You were evaluated in the Emergency Department and after careful evaluation, we did not find any emergent condition requiring admission or further testing in the hospital.  Your exam/testing today was overall reassuring.  We performed an incision and drainage of your thrombosed hemorrhoid here in the emergency department.  This area may bleed or drain a bit for the next 2 days.  Continue Preparation H, recommend an over-the-counter stool softener such as MiraLAX.  Recommend follow-up with your general surgeon if symptoms continue.  Please return to the Emergency Department if you experience any worsening of your condition.  Thank you for allowing [place] to be a part of your care.        Sabas Sous, MD 11/16/21 [phone]

## 2021-11-19 ENCOUNTER — Telehealth: Payer: Self-pay

## 2021-11-19 MED FILL — Oxycodone w/ Acetaminophen Tab 5-325 MG: ORAL | Qty: 6 | Status: AC

## 2021-11-19 NOTE — Telephone Encounter (Signed)
Transition Care Management Unsuccessful Follow-up Telephone Call  Date of discharge and from where:  11/16/2021 from [facility]  Attempts:  1st Attempt  Reason for unsuccessful TCM follow-up call:  Left voice message

## 2021-11-20 NOTE — Telephone Encounter (Signed)
Transition Care Management Follow-up Telephone Call Date of discharge and from where: 11/16/2021 from [facility] How have you been since you were released from the hospital? Pt stated that she is feeling better and did not have any questions.  Any questions or concerns? No  Items Reviewed: Did the pt receive and understand the discharge instructions provided? Yes  Medications obtained and verified? Yes  Other? No  Any new allergies since your discharge? No  Dietary orders reviewed? No Do you have support at home? Yes   Functional Questionnaire: (I = Independent and D = Dependent) ADLs: I Bathing/Dressing- I Meal Prep- I Eating- I Maintaining continence- I Transferring/Ambulation- I Managing Meds- I   Follow up appointments reviewed: PCP Hospital f/u appt confirmed? Yes  Scheduled to see Bjorn Pippin, NP on 11/22/2021 @ 9:00am. Specialist Hospital f/u appt confirmed? Yes  Scheduled to see Cyril Mourning, NP on 11/22/2021 @ 11:00am. Are transportation arrangements needed? No  If their condition worsens, is the pt aware to call PCP or go to the Emergency Dept.? Yes Was the patient provided with contact information for the PCP's office or ED? Yes Was to pt encouraged to call back with questions or concerns? Yes

## 2021-11-22 ENCOUNTER — Encounter: Payer: Self-pay | Admitting: Nurse Practitioner

## 2021-11-22 ENCOUNTER — Ambulatory Visit: Payer: Medicaid Other | Admitting: Adult Health

## 2021-11-22 ENCOUNTER — Other Ambulatory Visit: Payer: Self-pay

## 2021-11-22 ENCOUNTER — Encounter: Payer: Self-pay | Admitting: Adult Health

## 2021-11-22 ENCOUNTER — Ambulatory Visit (INDEPENDENT_AMBULATORY_CARE_PROVIDER_SITE_OTHER): Payer: Medicaid Other | Admitting: Nurse Practitioner

## 2021-11-22 VITALS — BP 149/83 | HR 97 | Ht 60.0 in | Wt 221.0 lb

## 2021-11-22 VITALS — BP 140/93 | HR 91 | Ht 60.0 in | Wt 221.0 lb

## 2021-11-22 DIAGNOSIS — K649 Unspecified hemorrhoids: Secondary | ICD-10-CM | POA: Diagnosis not present

## 2021-11-22 DIAGNOSIS — Z0001 Encounter for general adult medical examination with abnormal findings: Secondary | ICD-10-CM | POA: Diagnosis not present

## 2021-11-22 DIAGNOSIS — K648 Other hemorrhoids: Secondary | ICD-10-CM | POA: Diagnosis not present

## 2021-11-22 DIAGNOSIS — E785 Hyperlipidemia, unspecified: Secondary | ICD-10-CM

## 2021-11-22 DIAGNOSIS — I10 Essential (primary) hypertension: Secondary | ICD-10-CM

## 2021-11-22 MED ORDER — AMLODIPINE BESYLATE 5 MG PO TABS: 5.0000 mg | ORAL_TABLET | Freq: Every day | ORAL | 3 refills | Status: DC

## 2021-11-22 NOTE — Assessment & Plan Note (Addendum)
-  she is interested in weight loss, but phentermine didn't work for her -her HR is elevated at 97 today, so I don't want to start her on stimulants -on Medicaid, so not eligible for GLP-1s -suggested intermittent fasting before considering weight loss surgery -referral to medical weight mgmt

## 2021-11-22 NOTE — Patient Instructions (Signed)
Please have labs drawn today ° °I will be moving to [facility] located at [address], [place], [place] [address] effective Dec 09, 2021. °If you would like to establish care with [facility] please call [phone]. °

## 2021-11-22 NOTE — Assessment & Plan Note (Signed)
-  thrombosed earlier this week, and she had thrombectomy at ED -she would like definitive treatment -referral to surgery

## 2021-11-22 NOTE — Progress Notes (Signed)
Acute Office Visit  Subjective:    Patient ID: Destiny Petty, female    DOB: [birth year], 40 y.o.   MRN: [id number]  Chief Complaint  Patient presents with   Annual Exam    cpe    HPI Patient is in today for physical exam. She is followed by [facility] for GYN needs and saw them recently.  She had a thrombosed hemorrhoid that she had lanced at the ED, but she still has a skin tag and would like definitive treatment.  At her last OV, she had elevated WBC and significant hyperlipidemia.   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   Anxiety    Arthritis 12/04/2016   Phreesia 05/23/2020   Asthma    Back pain with right-sided sciatica 06/18/2016   Bipolar disorder ([place]) 01/15/2016   Overview:  Tried Depakote   Body mass index 36.0-36.9, adult 04/10/2016   Body mass index 37.0-37.9, adult 03/13/2016   Depression    Depressive disorder, not elsewhere classified 03/27/2007   Dysmenorrhea 04/10/2016   Family history of adverse reaction to anesthesia    PONV   GERD (gastroesophageal reflux disease)    Hemorrhoids 02/10/2015   Hiatal hernia    Hyperlipidemia 04/09/2011   Hypertension    no meds   Injury of digital nerve of right little finger 08/23/2020   Laceration of blood vessel of right little finger 08/23/2020   Laceration of finger of left hand without damage to nail    left small finger   Menorrhagia with regular cycle 04/10/2016   Migraine 01/15/2016   Overview:  Overview:  IMO Load 2016 R1.3   Muscle spasm of back 03/13/2016   Numbness and tingling in left hand 08/19/2016   Obesity    Ovarian cyst 12/18/2016   Perianal wart 02/10/2015   Stress 02/14/2016   Weight loss counseling, encounter for 03/13/2016    Past Surgical History:  Procedure Laterality Date   ABLATION     CESAREAN SECTION     CESAREAN SECTION N/A    Phreesia 05/23/2020   CHOLECYSTECTOMY     COLONOSCOPY     DILATATION AND CURETTAGE/HYSTEROSCOPY WITH MINERVA  N/A 08/31/2019   Procedure: DILATATION AND CURETTAGE (no specimen) /HYSTEROSCOPY WITH MINERVA ENDOMETRIAL ABLATION;  Surgeon: Jonnie Kind, MD;  Location: [facility];  Service: Gynecology;  Laterality: N/A;   ESOPHAGOGASTRODUODENOSCOPY ENDOSCOPY     TENDON EXPLORATION Left 08/10/2020   Procedure: LEFT SMALL FINER EXPLORATION, REPAIR OF TENDON, ARTERY, NERVE;  Surgeon: Leanora Cover, MD;  Location: [place];  Service: Orthopedics;  Laterality: Left;   TUBAL LIGATION      Family History  Problem Relation Age of Onset   Diabetes Mother    Kidney failure Mother    Early death Mother 56       diabetes complications   Cancer Father 33       colon cancer   Depression Sister    Hypertension Sister    Anxiety disorder Sister    Other Sister        back problems   ADD / ADHD Daughter    Other Daughter        behavioral issues   ODD Daughter    Asthma Son    Cancer Maternal Grandmother        breast   Seizures Maternal Grandmother    Lupus Other    Breast cancer Maternal Aunt  Social History   Socioeconomic History   Marital status: Legally Separated    Spouse name: Not on file   Number of children: 2   Years of education: 14   Highest education level: Not on file  Occupational History   Not on file  Tobacco Use   Smoking status: Every Day    Packs/day: 1.00    Years: 26.00    Pack years: 26.00    Types: Cigarettes   Smokeless tobacco: Never   Tobacco comments:    1/4 ppd 05/30/21  Vaping Use   Vaping Use: Never used  Substance and Sexual Activity   Alcohol use: Yes    Comment: occ   Drug use: No   Sexual activity: Yes    Birth control/protection: Surgical    Comment: tubal  Other Topics Concern   Not on file  Social History Narrative   Legally separated right now   2 children: son 31 and daughter 65 lives with her      2 dogs: macho, mary jane   2 birds:    1 cats: boghgie       Enjoys: spending time with kids, outside things      Diet:  eat all food groups    Caffeine: coffee and soda daily   Water: 4 cups daily      Wears seat belt   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   No weapons        Social Determinants of Radio broadcast assistant Strain: Not on file  Food Insecurity: Not on file  Transportation Needs: Not on file  Physical Activity: Not on file  Stress: Not on file  Social Connections: Not on file  Intimate Partner Violence: Not on file    Outpatient Medications Prior to Visit  Medication Sig Dispense Refill   albuterol (PROAIR HFA) 108 (90 Base) MCG/ACT inhaler INHALE 1 OR 2 PUFFS INTO THE LUNGS EVERY 6 HOURS AS NEEDED FOR WHEEZING OR SHORTNESS OF BREATH 18 g 0   buPROPion (WELLBUTRIN XL) 300 MG 24 hr tablet Take 1 tablet (300 mg total) by mouth daily. 90 tablet 1   famotidine (PEPCID) 20 MG tablet One after supper 30 tablet 11   fenofibrate (TRICOR) 145 MG tablet Take 1 tablet (145 mg total) by mouth daily. 90 tablet 3   fluticasone (FLONASE) 50 MCG/ACT nasal spray Place 2 sprays into both nostrils daily. 16 g 2   hydrochlorothiazide (MICROZIDE) 12.5 MG capsule TAKE ONE TABLET BY MOUTH ONCE DAILY. 30 capsule 2   Omega-3 Fatty Acids (FISH OIL) 1000 MG CAPS Take 3 capsules (3,000 mg total) by mouth daily. 270 capsule 3   rizatriptan (MAXALT-MLT) 10 MG disintegrating tablet Take 1 tablet (10 mg total) by mouth as needed for migraine. May repeat in 2 hours if needed 10 tablet 0   Tiotropium Bromide-Olodaterol (STIOLTO RESPIMAT) 2.5-2.5 MCG/ACT AERS Inhale 2 puffs into the lungs daily. 4 g 11   topiramate (TOPAMAX) 100 MG tablet TAKE (1) TABLET BY MOUTH ONCE DAILY. 90 tablet 3   amLODipine (NORVASC) 5 MG tablet Take 1 tablet (5 mg total) by mouth daily. 90 tablet 3   oxyCODONE-acetaminophen (PERCOCET/ROXICET) 5-325 MG tablet Take 1 tablet by mouth every 6 (six) hours as needed for severe pain. 6 tablet 0   phentermine 15 MG capsule Take 1 capsule (15 mg total) by mouth every morning. 30  capsule 0   levofloxacin (LEVAQUIN) 750 MG tablet Take  1 tablet (750 mg total) by mouth daily. (Patient not taking: Reported on 11/22/2021) 7 tablet 0   predniSONE (STERAPRED UNI-PAK 48 TAB) 10 MG (48) TBPK tablet Take as directed. (Patient not taking: Reported on 11/22/2021) 48 tablet 0   No facility-administered medications prior to visit.    Allergies  Allergen Reactions   Bee Venom Shortness Of Breath   Latex Itching   Lisinopril Cough    coughing    Review of Systems  Constitutional: Negative.   HENT: Negative.    Eyes: Negative.   Respiratory: Negative.    Cardiovascular: Negative.   Gastrointestinal:        Hemorrhoidal skin tag  Endocrine: Negative.   Genitourinary: Negative.   Musculoskeletal: Negative.   Skin: Negative.   Allergic/Immunologic: Negative.   Neurological: Negative.   Hematological: Negative.   Psychiatric/Behavioral: Negative.        Objective:    Physical Exam Constitutional:      Appearance: Normal appearance. She is obese.  HENT:     Head: Normocephalic and atraumatic.     Right Ear: Tympanic membrane, ear canal and external ear normal.     Left Ear: Tympanic membrane, ear canal and external ear normal.     Nose: Nose normal.     Mouth/Throat:     Mouth: Mucous membranes are moist.     Pharynx: Oropharynx is clear.  Eyes:     Extraocular Movements: Extraocular movements intact.     Conjunctiva/sclera: Conjunctivae normal.     Pupils: Pupils are equal, round, and reactive to light.  Cardiovascular:     Rate and Rhythm: Normal rate and regular rhythm.     Pulses: Normal pulses.     Heart sounds: Normal heart sounds.  Pulmonary:     Effort: Pulmonary effort is normal.     Breath sounds: Normal breath sounds.  Abdominal:     General: Abdomen is flat. Bowel sounds are normal.     Palpations: Abdomen is soft.  Musculoskeletal:        General: Normal range of motion.     Cervical back: Normal range of motion and neck supple.  Skin:     General: Skin is warm and dry.     Capillary Refill: Capillary refill takes more than 3 seconds.  Neurological:     General: No focal deficit present.     Mental Status: She is alert and oriented to person, place, and time.  Psychiatric:        Mood and Affect: Mood normal.        Behavior: Behavior normal.        Thought Content: Thought content normal.        Judgment: Judgment normal.    BP (!) 149/83    Pulse 97    Ht 5' (1.524 m)    Wt 221 lb 0.6 oz (100.3 kg)    SpO2 95%    BMI 43.17 kg/m  Wt Readings from Last 3 Encounters:  11/22/21 221 lb 0.6 oz (100.3 kg)  11/16/21 220 lb (99.8 kg)  09/07/21 216 lb (98 kg)    There are no preventive care reminders to display for this patient.   There are no preventive care reminders to display for this patient.   Lab Results  Component Value Date   TSH 2.34 12/04/2016   Lab Results  Component Value Date   WBC 12.2 (H) 09/07/2021   HGB 14.4 09/07/2021   HCT 42.0 09/07/2021   MCV 94  09/07/2021   PLT 268 09/07/2021   Lab Results  Component Value Date   NA 138 08/28/2021   K 4.0 08/28/2021   CO2 16 (L) 08/28/2021   GLUCOSE 95 08/28/2021   BUN 14 08/28/2021   CREATININE 0.75 08/28/2021   BILITOT 0.2 08/28/2021   ALKPHOS 104 08/28/2021   AST 13 08/28/2021   ALT 17 08/28/2021   PROT 7.3 08/28/2021   ALBUMIN 4.7 08/28/2021   CALCIUM 9.7 08/28/2021   ANIONGAP 11 06/06/2021   EGFR 102 08/28/2021   Lab Results  Component Value Date   CHOL 202 (H) 09/07/2021   Lab Results  Component Value Date   HDL 36 (L) 09/07/2021   Lab Results  Component Value Date   LDLCALC 80 09/07/2021   Lab Results  Component Value Date   TRIG 540 (H) 09/07/2021   Lab Results  Component Value Date   CHOLHDL 4.8 06/01/2020   Lab Results  Component Value Date   HGBA1C 5.2 06/01/2020   HGBA1C 5.2 06/01/2020   HGBA1C 5.2 (A) 06/01/2020   HGBA1C 5.2 06/01/2020       Assessment & Plan:   Problem List Items Addressed This Visit        Cardiovascular and Mediastinum   Essential hypertension    BP Readings from Last 3 Encounters:  11/22/21 (!) 149/83  11/16/21 127/87  11/07/21 139/90  -refilled amlodipine      Relevant Medications   amLODipine (NORVASC) 5 MG tablet   Other Relevant Orders   CBC with Differential/Platelet   CMP14+EGFR   Lipid Panel With LDL/HDL Ratio   Hemorrhoid    -thrombosed earlier this week, and she had thrombectomy at ED -she would like definitive treatment -referral to surgery      Relevant Medications   amLODipine (NORVASC) 5 MG tablet   Other Relevant Orders   Ambulatory referral to General Surgery     Other   Morbid obesity due to excess calories ([place])    -she is interested in weight loss, but phentermine didn't work for her -her HR is elevated at 97 today, so I don't want to start her on stimulants -on Medicaid, so not eligible for GLP-1s -suggested intermittent fasting before considering weight loss surgery -referral to medical weight mgmt      Relevant Orders   Amb Ref to Medical Weight Management   Hyperlipidemia    -check labs      Relevant Medications   amLODipine (NORVASC) 5 MG tablet   Other Relevant Orders   Lipid Panel With LDL/HDL Ratio   Encounter for general adult medical examination with abnormal findings - Primary    Exam completed today      Relevant Orders   CBC with Differential/Platelet   CMP14+EGFR   Lipid Panel With LDL/HDL Ratio   Hemoglobin A1c   TSH     Meds ordered this encounter  Medications   amLODipine (NORVASC) 5 MG tablet    Sig: Take 1 tablet (5 mg total) by mouth daily.    Dispense:  90 tablet    Refill:  [place], NP

## 2021-11-22 NOTE — Progress Notes (Signed)
°  Subjective:     Patient ID: Destiny Petty, female   DOB: 25-Aug-1981, 40 y.o.   MRN: [id number]  HPI Destiny Petty is a 40 year old white female,separated, in complaining of hemorrhoids, was seen in ER 11/16/21 and had I&D, she had CPE with PCP today and has appt with Dr Lovell Sheehan 12/04/21.  She said Dr Emelda Fear did I&D 2 years ago.   Lab Results  Component Value Date   DIAGPAP  07/23/2019    NEGATIVE FOR INTRAEPITHELIAL LESIONS OR MALIGNANCY.   HPV NOT Detected 07/23/2019   PCP is Kym Groom NP.   Review of Systems +hemorrhoids,painful  Reviewed past medical,surgical, social and family history. Reviewed medications and allergies.     Objective:   Physical Exam BP (!) 140/93 (BP Location: Right Arm, Patient Position: Sitting, Cuff Size: Large)    Pulse 91    Ht 5' (1.524 m)    Wt 221 lb (100.2 kg)    BMI 43.16 kg/m     Skin warm and dry. Has swollen external hemorrhoid, no clots seen today. Pt gave verbal consent for exam with out chaperone.  [place] - 11/22/21 1043       Pregnancy Intention Screening   Does the patient want to become pregnant in the next year? N/A    Does the patient's partner want to become pregnant in the next year? N/A    Would the patient like to discuss contraceptive options today? N/A      Contraception Wrap Up   Current Method Female Sterilization    End Method Female Sterilization    Contraception Counseling Provided No             Assessment:     1. Other hemorrhoids     Plan:     Try sitz bath,Benefiber, fruit and increased water, use peri bottle and soft wet wipes to pat Will rx compounded hemorrhoid cream from [place], has lidocaine, HC,Proxine, and phenylephrine in it use 3-4 x daily #0 gm with 1 refill  Keep appt with Dr Lovell Sheehan Follow up prn

## 2021-11-22 NOTE — Assessment & Plan Note (Signed)
BP Readings from Last 3 Encounters:  11/22/21 (!) 149/83  11/16/21 127/87  11/07/21 139/90   -refilled amlodipine

## 2021-11-22 NOTE — Assessment & Plan Note (Signed)
Exam completed today

## 2021-11-22 NOTE — Assessment & Plan Note (Signed)
-   check labs

## 2021-11-23 ENCOUNTER — Ambulatory Visit: Payer: Medicaid Other | Admitting: Adult Health

## 2021-11-23 LAB — CMP14+EGFR
ALT: 17 IU/L (ref 0–32)
AST: 22 IU/L (ref 0–40)
Albumin/Globulin Ratio: 2 (ref 1.2–2.2)
Albumin: 4.5 g/dL (ref 3.8–4.8)
Alkaline Phosphatase: 71 IU/L (ref 44–121)
BUN/Creatinine Ratio: 17 (ref 9–23)
BUN: 14 mg/dL (ref 6–24)
Bilirubin Total: 0.3 mg/dL (ref 0.0–1.2)
CO2: 19 mmol/L — ABNORMAL LOW (ref 20–29)
Calcium: 9.5 mg/dL (ref 8.7–10.2)
Chloride: 103 mmol/L (ref 96–106)
Creatinine, Ser: 0.84 mg/dL (ref 0.57–1.00)
Globulin, Total: 2.2 g/dL (ref 1.5–4.5)
Glucose: 79 mg/dL (ref 70–99)
Potassium: 4.3 mmol/L (ref 3.5–5.2)
Sodium: 136 mmol/L (ref 134–144)
Total Protein: 6.7 g/dL (ref 6.0–8.5)
eGFR: 89 mL/min/{1.73_m2} (ref >59)

## 2021-11-23 LAB — CBC WITH DIFFERENTIAL/PLATELET
Basophils Absolute: 0.1 10*3/uL (ref 0.0–0.2)
Basos: 1 %
EOS (ABSOLUTE): 0.4 10*3/uL (ref 0.0–0.4)
Eos: 4 %
Hematocrit: 44.6 % (ref 34.0–46.6)
Hemoglobin: 15.4 g/dL (ref 11.1–15.9)
Immature Grans (Abs): 0.1 10*3/uL (ref 0.0–0.1)
Immature Granulocytes: 1 %
Lymphocytes Absolute: 2.7 10*3/uL (ref 0.7–3.1)
Lymphs: 25 %
MCH: 32.6 pg (ref 26.6–33.0)
MCHC: 34.5 g/dL (ref 31.5–35.7)
MCV: 94 fL (ref 79–97)
Monocytes Absolute: 0.9 10*3/uL (ref 0.1–0.9)
Monocytes: 8 %
Neutrophils Absolute: 6.8 10*3/uL (ref 1.4–7.0)
Neutrophils: 61 %
Platelets: 285 10*3/uL (ref 150–450)
RBC: 4.73 x10E6/uL (ref 3.77–5.28)
RDW: 12.4 % (ref 11.7–15.4)
WBC: 10.9 10*3/uL — ABNORMAL HIGH (ref 3.4–10.8)

## 2021-11-23 LAB — LIPID PANEL WITH LDL/HDL RATIO
Cholesterol, Total: 171 mg/dL (ref 100–199)
HDL: 48 mg/dL (ref >39)
LDL Chol Calc (NIH): 105 mg/dL — ABNORMAL HIGH (ref 0–99)
LDL/HDL Ratio: 2.2 ratio (ref 0.0–3.2)
Triglycerides: 100 mg/dL (ref 0–149)
VLDL Cholesterol Cal: 18 mg/dL (ref 5–40)

## 2021-11-23 LAB — HEMOGLOBIN A1C
Est. average glucose Bld gHb Est-mCnc: 105 mg/dL
Hgb A1c MFr Bld: 5.3 % (ref 4.8–5.6)

## 2021-11-23 LAB — TSH: TSH: 2.71 u[IU]/mL (ref 0.450–4.500)

## 2021-11-23 NOTE — Progress Notes (Signed)
WBC are trending down, and triglycerides are back in the normal range. Overall, labs look great.

## 2021-12-04 ENCOUNTER — Ambulatory Visit: Payer: Medicaid Other | Admitting: General Surgery

## 2021-12-04 ENCOUNTER — Encounter: Payer: Self-pay | Admitting: General Surgery

## 2021-12-04 ENCOUNTER — Other Ambulatory Visit: Payer: Self-pay

## 2021-12-04 VITALS — BP 128/86 | HR 93 | Temp 99.2°F | Resp 16 | Ht 60.0 in | Wt 228.0 lb

## 2021-12-04 DIAGNOSIS — K645 Perianal venous thrombosis: Secondary | ICD-10-CM | POA: Diagnosis not present

## 2021-12-04 NOTE — Progress Notes (Signed)
Destiny Petty; [id number]; [birth year]   HPI Patient is a 40 year old white female who was referred to my care by the emergency room and Dr. Allena Katz for evaluation and treatment of thrombosed hemorrhoid.  She states she has had intermittent hemorrhoidal issues in the past.  She was seen in the emergency room on 11/17/2021 with a thrombosed hemorrhoid.  This was lanced in the emergency room and the clot evacuated.  Since that time, she has had irritation in the anus and feels that there is a lump still present.  It has improved since her ER visit.  She sometimes senses that she cannot keep her self clean when wiping herself.  No bloody drainage has been noted recently.  She has been putting a cream on the hemorrhoid. 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   Anxiety    Arthritis 12/04/2016   Phreesia 05/23/2020   Asthma    Back pain with right-sided sciatica 06/18/2016   Bipolar disorder ([facility]) 01/15/2016   Overview:  Tried Depakote   Body mass index 36.0-36.9, adult 04/10/2016   Body mass index 37.0-37.9, adult 03/13/2016   Depression    Depressive disorder, not elsewhere classified 03/27/2007   Dysmenorrhea 04/10/2016   Family history of adverse reaction to anesthesia    PONV   GERD (gastroesophageal reflux disease)    Hemorrhoids 02/10/2015   Hiatal hernia    Hyperlipidemia 04/09/2011   Hypertension    no meds   Injury of digital nerve of right little finger 08/23/2020   Laceration of blood vessel of right little finger 08/23/2020   Laceration of finger of left hand without damage to nail    left small finger   Menorrhagia with regular cycle 04/10/2016   Migraine 01/15/2016   Overview:  Overview:  IMO Load 2016 R1.3   Muscle spasm of back 03/13/2016   Numbness and tingling in left hand 08/19/2016   Obesity    Ovarian cyst 12/18/2016   Perianal wart 02/10/2015   Stress 02/14/2016   Weight loss counseling, encounter for 03/13/2016    Past Surgical  History:  Procedure Laterality Date   ABLATION     CESAREAN SECTION     CESAREAN SECTION N/A    Phreesia 05/23/2020   CHOLECYSTECTOMY     COLONOSCOPY     DILATATION AND CURETTAGE/HYSTEROSCOPY WITH MINERVA N/A 08/31/2019   Procedure: DILATATION AND CURETTAGE (no specimen) /HYSTEROSCOPY WITH MINERVA ENDOMETRIAL ABLATION;  Surgeon: Tilda Burrow, MD;  Location: [facility];  Service: Gynecology;  Laterality: N/A;   ESOPHAGOGASTRODUODENOSCOPY ENDOSCOPY     TENDON EXPLORATION Left 08/10/2020   Procedure: LEFT SMALL FINER EXPLORATION, REPAIR OF TENDON, ARTERY, NERVE;  Surgeon: Betha Loa, MD;  Location: [facility];  Service: Orthopedics;  Laterality: Left;   TUBAL LIGATION      Family History  Problem Relation Age of Onset   Diabetes Mother    Kidney failure Mother    Early death Mother 43       diabetes complications   Cancer Father 43       colon cancer   Depression Sister    Hypertension Sister    Anxiety disorder Sister    Other Sister        back problems   ADD / ADHD Daughter    Other Daughter        behavioral issues   ODD Daughter    Asthma Son    Cancer  Maternal Grandmother        breast   Seizures Maternal Grandmother    Lupus Other    Breast cancer Maternal Aunt     Current Outpatient Medications on File Prior to Visit  Medication Sig Dispense Refill   albuterol (PROAIR HFA) 108 (90 Base) MCG/ACT inhaler INHALE 1 OR 2 PUFFS INTO THE LUNGS EVERY 6 HOURS AS NEEDED FOR WHEEZING OR SHORTNESS OF BREATH 18 g 0   buPROPion (WELLBUTRIN XL) 300 MG 24 hr tablet Take 1 tablet (300 mg total) by mouth daily. 90 tablet 1   famotidine (PEPCID) 20 MG tablet One after supper 30 tablet 11   fenofibrate (TRICOR) 145 MG tablet Take 1 tablet (145 mg total) by mouth daily. 90 tablet 3   fluticasone (FLONASE) 50 MCG/ACT nasal spray Place 2 sprays into both nostrils daily. 16 g 2   hydrochlorothiazide (MICROZIDE) 12.5 MG capsule TAKE ONE TABLET BY MOUTH ONCE DAILY. 30 capsule  2   Omega-3 Fatty Acids (FISH OIL) 1000 MG CAPS Take 3 capsules (3,000 mg total) by mouth daily. 270 capsule 3   rizatriptan (MAXALT-MLT) 10 MG disintegrating tablet Take 1 tablet (10 mg total) by mouth as needed for migraine. May repeat in 2 hours if needed 10 tablet 0   topiramate (TOPAMAX) 100 MG tablet TAKE (1) TABLET BY MOUTH ONCE DAILY. 90 tablet 3   No current facility-administered medications on file prior to visit.    Allergies  Allergen Reactions   Bee Venom Shortness Of Breath   Latex Itching   Lisinopril Cough    coughing    Social History   Substance and Sexual Activity  Alcohol Use Yes   Comment: occ    Social History   Tobacco Use  Smoking Status Every Day   Packs/day: 1.00   Years: 26.00   Pack years: 26.00   Types: Cigarettes  Smokeless Tobacco Never  Tobacco Comments   1/4 ppd 05/30/21    Review of Systems  Constitutional:  Positive for malaise/fatigue.  HENT:  Positive for sinus pain.   Eyes:  Positive for blurred vision.  Respiratory:  Positive for cough.   Cardiovascular: Negative.   Gastrointestinal:  Positive for heartburn.  Genitourinary: Negative.   Musculoskeletal:  Positive for joint pain.  Skin: Negative.   Neurological:  Positive for headaches.  Endo/Heme/Allergies: Negative.   Psychiatric/Behavioral: Negative.     Objective   Vitals:   12/04/21 1512  BP: 128/86  Pulse: 93  Resp: 16  Temp: 99.2 F (37.3 C)  SpO2: 96%    Physical Exam Vitals reviewed.  Constitutional:      Appearance: Normal appearance. She is not ill-appearing.  HENT:     Head: Normocephalic and atraumatic.  Cardiovascular:     Rate and Rhythm: Normal rate and regular rhythm.     Heart sounds: Normal heart sounds. No murmur heard.   No friction rub. No gallop.  Pulmonary:     Effort: Pulmonary effort is normal. No respiratory distress.     Breath sounds: Normal breath sounds. No rhonchi or rales.  Genitourinary:    Comments: Small soft external  hemorrhoid at the 7 o'clock position. No significant thrombus present.  Normal sphincter tone.  No blood.  No signficant internal hemorrhoids present. Skin:    General: Skin is warm and dry.  Neurological:     Mental Status: She is alert and oriented to person, place, and time.    Assessment  Thrombosed external hemorrhoid, resolving Plan  No  need for acute surgical invention at this time.  I told the patient that this may resolve with time.  Would like to see how it is resolving prior to committing her to surgical intervention.  She will follow-up here in 2 weeks.  Literature was given.

## 2021-12-18 ENCOUNTER — Ambulatory Visit: Payer: Medicaid Other | Admitting: General Surgery

## 2021-12-18 ENCOUNTER — Telehealth: Payer: Self-pay | Admitting: *Deleted

## 2021-12-18 NOTE — Telephone Encounter (Signed)
Received call from patient at 2:50pm to cancel appointment scheduled for 2:15pm.   Reports that hemorrhoid has improved and pain is much better. States that thrombosed area has reduced in size.   States that she will call back if area returns.

## 2021-12-20 ENCOUNTER — Other Ambulatory Visit: Payer: Self-pay | Admitting: Family Medicine

## 2022-02-10 ENCOUNTER — Other Ambulatory Visit: Payer: Self-pay

## 2022-02-10 ENCOUNTER — Ambulatory Visit
Admission: EM | Admit: 2022-02-10 | Discharge: 2022-02-10 | Disposition: A | Discharge status: Home / Self Care | Payer: Medicaid Other | Attending: Urgent Care | Admitting: Urgent Care

## 2022-02-10 ENCOUNTER — Encounter: Payer: Self-pay | Admitting: Emergency Medicine

## 2022-02-10 DIAGNOSIS — J3089 Other allergic rhinitis: Secondary | ICD-10-CM

## 2022-02-10 DIAGNOSIS — J069 Acute upper respiratory infection, unspecified: Secondary | ICD-10-CM

## 2022-02-10 DIAGNOSIS — J4540 Moderate persistent asthma, uncomplicated: Secondary | ICD-10-CM | POA: Diagnosis not present

## 2022-02-10 DIAGNOSIS — J4530 Mild persistent asthma, uncomplicated: Secondary | ICD-10-CM | POA: Diagnosis not present

## 2022-02-10 DIAGNOSIS — J309 Allergic rhinitis, unspecified: Secondary | ICD-10-CM | POA: Diagnosis not present

## 2022-02-10 MED ORDER — PROMETHAZINE-DM 6.25-15 MG/5ML PO SYRP: 5.0000 mL | ORAL_SOLUTION | Freq: Four times a day (QID) | ORAL | 0 refills | Status: DC | PRN

## 2022-02-10 MED ORDER — ALBUTEROL SULFATE HFA 108 (90 BASE) MCG/ACT IN AERS: INHALATION_SPRAY | RESPIRATORY_TRACT | 0 refills | Status: DC

## 2022-02-10 MED ORDER — LEVOCETIRIZINE DIHYDROCHLORIDE 5 MG PO TABS: 5.0000 mg | ORAL_TABLET | Freq: Every evening | ORAL | 0 refills | Status: DC

## 2022-02-10 MED ORDER — FLUTICASONE PROPIONATE 50 MCG/ACT NA SUSP: 2.0000 | Freq: Every day | NASAL | 2 refills | Status: DC

## 2022-02-10 MED ORDER — PREDNISONE 50 MG PO TABS: 50.0000 mg | ORAL_TABLET | Freq: Every day | ORAL | 0 refills | Status: DC

## 2022-02-10 NOTE — ED Provider Notes (Signed)
?[facility] ? ? ?MRN: [id number] DOB: 15-May-1981 ? ?Subjective:  ? ?Destiny Petty is a 41 y.o. female presenting for 2 day history of acute onset sinus pressure, sinus congestion, ear pressure, right ear pain. Has a history of allergic rhinitis, asthma. Is out of her albuterol inhaler.  She is not taking any allergy medications.  Smokes 1ppd.  Has had a difficult time getting in with her PCP and pulmonologist.  Would like a COVID test. ? ?No current facility-administered medications for this encounter. ? ?Current Outpatient Medications:  ?  albuterol (PROAIR HFA) 108 (90 Base) MCG/ACT inhaler, INHALE 1 OR 2 PUFFS INTO THE LUNGS EVERY 6 HOURS AS NEEDED FOR WHEEZING OR SHORTNESS OF BREATH, Disp: 18 g, Rfl: 0 ?  buPROPion (WELLBUTRIN XL) 300 MG 24 hr tablet, Take 1 tablet (300 mg total) by mouth daily., Disp: 90 tablet, Rfl: 1 ?  famotidine (PEPCID) 20 MG tablet, One after supper, Disp: 30 tablet, Rfl: 11 ?  fenofibrate (TRICOR) 145 MG tablet, Take 1 tablet (145 mg total) by mouth daily., Disp: 90 tablet, Rfl: 3 ?  fluticasone (FLONASE) 50 MCG/ACT nasal spray, Place 2 sprays into both nostrils daily., Disp: 16 g, Rfl: 2 ?  hydrochlorothiazide (MICROZIDE) 12.5 MG capsule, TAKE ONE TABLET BY MOUTH ONCE DAILY., Disp: 30 capsule, Rfl: 2 ?  Omega-3 Fatty Acids (FISH OIL) 1000 MG CAPS, Take 3 capsules (3,000 mg total) by mouth daily., Disp: 270 capsule, Rfl: 3 ?  rizatriptan (MAXALT-MLT) 10 MG disintegrating tablet, TAKE 1 TABLET BY MOUTH ONCE AS NEEDED FOR MIGRAINE. MAY REPEAT IN 2 HOURS IF NEEDED., Disp: 10 tablet, Rfl: 0 ?  topiramate (TOPAMAX) 100 MG tablet, TAKE (1) TABLET BY MOUTH ONCE DAILY., Disp: 90 tablet, Rfl: 3  ? ?Allergies  ?Allergen Reactions  ? Bee Venom Shortness Of Breath  ? Latex Itching  ? Lisinopril Cough  ?  coughing  ? ? ?Past Medical History:  ?Diagnosis Date  ? Allergy   ? SEASONAL  ? 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 Anxiety   ? Arthritis 12/04/2016  ? Phreesia 05/23/2020  ?  Asthma   ? Back pain with right-sided sciatica 06/18/2016  ? Bipolar disorder (HCC) 01/15/2016  ? Overview:  Tried Depakote  ? Body mass index 36.0-36.9, adult 04/10/2016  ? Body mass index 37.0-37.9, adult 03/13/2016  ? Depression   ? Depressive disorder, not elsewhere classified 03/27/2007  ? Dysmenorrhea 04/10/2016  ? Family history of adverse reaction to anesthesia   ? PONV  ? GERD (gastroesophageal reflux disease)   ? Hemorrhoids 02/10/2015  ? Hiatal hernia   ? Hyperlipidemia 04/09/2011  ? Hypertension   ? no meds  ? Injury of digital nerve of right little finger 08/23/2020  ? Laceration of blood vessel of right little finger 08/23/2020  ? Laceration of finger of left hand without damage to nail   ? left small finger  ? Menorrhagia with regular cycle 04/10/2016  ? Migraine 01/15/2016  ? Overview:  Overview:  IMO Load 2016 R1.3  ? Muscle spasm of back 03/13/2016  ? Numbness and tingling in left hand 08/19/2016  ? Obesity   ? Ovarian cyst 12/18/2016  ? Perianal wart 02/10/2015  ? Stress 02/14/2016  ? Weight loss counseling, encounter for 03/13/2016  ?  ? ?Past Surgical History:  ?Procedure Laterality Date  ? ABLATION    ? CESAREAN SECTION    ? CESAREAN SECTION N/A   ? Phreesia 05/23/2020  ? CHOLECYSTECTOMY    ? COLONOSCOPY    ?  DILATATION AND CURETTAGE/HYSTEROSCOPY WITH MINERVA N/A 08/31/2019  ? Procedure: DILATATION AND CURETTAGE (no specimen) /HYSTEROSCOPY WITH MINERVA ENDOMETRIAL ABLATION;  Surgeon: Tilda Burrow, MD;  Location: [facility];  Service: Gynecology;  Laterality: N/A;  ? ESOPHAGOGASTRODUODENOSCOPY ENDOSCOPY    ? TENDON EXPLORATION Left 08/10/2020  ? Procedure: LEFT SMALL FINER EXPLORATION, REPAIR OF TENDON, ARTERY, NERVE;  Surgeon: Betha Loa, MD;  Location: [facility];  Service: Orthopedics;  Laterality: Left;  ? TUBAL LIGATION    ? ? ?Family History  ?Problem Relation Age of Onset  ? Diabetes Mother   ? Kidney failure Mother   ? Early death Mother 59  ?     diabetes complications  ? Cancer Father 3  ?     colon  cancer  ? Depression Sister   ? Hypertension Sister   ? Anxiety disorder Sister   ? Other Sister   ?     back problems  ? ADD / ADHD Daughter   ? Other Daughter   ?     behavioral issues  ? ODD Daughter   ? Asthma Son   ? Cancer Maternal Grandmother   ?     breast  ? Seizures Maternal Grandmother   ? Lupus Other   ? Breast cancer Maternal Aunt   ? ? ?Social History  ? ?Tobacco Use  ? Smoking status: Every Day  ?  Packs/day: 1.00  ?  Years: 26.00  ?  Pack years: 26.00  ?  Types: Cigarettes  ? Smokeless tobacco: Never  ? Tobacco comments:  ?  1/4 ppd 05/30/21  ?Vaping Use  ? Vaping Use: Never used  ?Substance Use Topics  ? Alcohol use: Yes  ?  Comment: occ  ? Drug use: No  ? ? ?ROS ? ? ?Objective:  ? ?Vitals: ?BP 136/82 (BP Location: Right Arm)   Pulse 97   Temp 98.6 ?F (37 ?C) (Oral)   Resp 18   SpO2 97%  ? ?Physical Exam ?Constitutional:   ?   General: She is not in acute distress. ?   Appearance: Normal appearance. She is well-developed and normal weight. She is not ill-appearing, toxic-appearing or diaphoretic.  ?HENT:  ?   Head: Normocephalic and atraumatic.  ?   Right Ear: Tympanic membrane, ear canal and external ear normal. No drainage or tenderness. No middle ear effusion. There is no impacted cerumen. Tympanic membrane is not erythematous.  ?   Left Ear: Tympanic membrane, ear canal and external ear normal. No drainage or tenderness.  No middle ear effusion. There is no impacted cerumen. Tympanic membrane is not erythematous.  ?   Nose: Congestion and rhinorrhea present.  ?   Mouth/Throat:  ?   Mouth: Mucous membranes are moist. No oral lesions.  ?   Pharynx: No pharyngeal swelling, oropharyngeal exudate, posterior oropharyngeal erythema or uvula swelling.  ?   Tonsils: No tonsillar exudate or tonsillar abscesses.  ?Eyes:  ?   General: No scleral icterus.    ?   Right eye: No discharge.     ?   Left eye: No discharge.  ?   Extraocular Movements: Extraocular movements intact.  ?   Right eye: Normal  extraocular motion.  ?   Left eye: Normal extraocular motion.  ?   Conjunctiva/sclera: Conjunctivae normal.  ?Cardiovascular:  ?   Rate and Rhythm: Normal rate.  ?   Heart sounds: No murmur heard. ?  No friction rub. No gallop.  ?Pulmonary:  ?  Effort: Pulmonary effort is normal. No respiratory distress.  ?   Breath sounds: No stridor. Wheezing present. No rhonchi or rales.  ?Chest:  ?   Chest wall: No tenderness.  ?Musculoskeletal:  ?   Cervical back: Normal range of motion and neck supple.  ?Lymphadenopathy:  ?   Cervical: No cervical adenopathy.  ?Skin: ?   General: Skin is warm and dry.  ?Neurological:  ?   General: No focal deficit present.  ?   Mental Status: She is alert and oriented to person, place, and time.  ?Psychiatric:     ?   Mood and Affect: Mood normal.     ?   Behavior: Behavior normal.  ? ? ?Assessment and Plan :  ? ?PDMP not reviewed this encounter. ? ?1. Viral upper respiratory infection   ?2. Mild persistent asthma without complication   ?3. Environmental and seasonal allergies   ?4. Allergic rhinitis, unspecified seasonality, unspecified trigger   ?5. Moderate persistent asthma without complication   ? ?COVID testing pending.  Recommended general supportive care for an acute viral syndrome.  However in the context of her difficult allergic rhinitis and asthma offered an oral prednisone course.  I refilled her albuterol inhaler and also Flonase.  Recommended starting Xyzal daily.  Deferred chest x-ray given no symptoms of pneumonia. Counseled patient on potential for adverse effects with medications prescribed/recommended today, ER and return-to-clinic precautions discussed, patient verbalized understanding. ? ?  ?Wallis Bamberg, PA-C ?02/10/22 1533 ? ?

## 2022-02-10 NOTE — ED Triage Notes (Signed)
Facial pressure and nasal congestion.  States she is out of her albuterol inhaler.   ?

## 2022-02-11 ENCOUNTER — Encounter (HOSPITAL_COMMUNITY): Payer: Self-pay

## 2022-02-11 ENCOUNTER — Emergency Department (HOSPITAL_COMMUNITY): Payer: Medicaid Other

## 2022-02-11 ENCOUNTER — Other Ambulatory Visit: Payer: Self-pay

## 2022-02-11 ENCOUNTER — Emergency Department (HOSPITAL_COMMUNITY)
Admission: EM | Admit: 2022-02-11 | Discharge: 2022-02-11 | Disposition: A | Discharge status: Home / Self Care | Payer: Medicaid Other | Attending: Emergency Medicine | Admitting: Emergency Medicine

## 2022-02-11 DIAGNOSIS — Z91040 Latex allergy status: Secondary | ICD-10-CM | POA: Diagnosis not present

## 2022-02-11 DIAGNOSIS — R1030 Lower abdominal pain, unspecified: Secondary | ICD-10-CM

## 2022-02-11 DIAGNOSIS — I10 Essential (primary) hypertension: Secondary | ICD-10-CM | POA: Insufficient documentation

## 2022-02-11 DIAGNOSIS — R1031 Right lower quadrant pain: Secondary | ICD-10-CM | POA: Diagnosis not present

## 2022-02-11 DIAGNOSIS — R300 Dysuria: Secondary | ICD-10-CM | POA: Insufficient documentation

## 2022-02-11 DIAGNOSIS — K5730 Diverticulosis of large intestine without perforation or abscess without bleeding: Secondary | ICD-10-CM | POA: Diagnosis not present

## 2022-02-11 LAB — COMPREHENSIVE METABOLIC PANEL
ALT: 17 U/L (ref 0–44)
AST: 18 U/L (ref 15–41)
Albumin: 3.9 g/dL (ref 3.5–5.0)
Alkaline Phosphatase: 54 U/L (ref 38–126)
Anion gap: 6 (ref 5–15)
BUN: 12 mg/dL (ref 6–20)
CO2: 21 mmol/L — ABNORMAL LOW (ref 22–32)
Calcium: 9 mg/dL (ref 8.9–10.3)
Chloride: 112 mmol/L — ABNORMAL HIGH (ref 98–111)
Creatinine, Ser: 0.71 mg/dL (ref 0.44–1.00)
GFR, Estimated: >60 mL/min (ref >60)
Glucose, Bld: 78 mg/dL (ref 70–99)
Potassium: 3.8 mmol/L (ref 3.5–5.1)
Sodium: 139 mmol/L (ref 135–145)
Total Bilirubin: 0.3 mg/dL (ref 0.3–1.2)
Total Protein: 7.1 g/dL (ref 6.5–8.1)

## 2022-02-11 LAB — CBC WITH DIFFERENTIAL/PLATELET
Abs Immature Granulocytes: 0.08 10*3/uL — ABNORMAL HIGH (ref 0.00–0.07)
Basophils Absolute: 0.1 10*3/uL (ref 0.0–0.1)
Basophils Relative: 0 %
Eosinophils Absolute: 0 10*3/uL (ref 0.0–0.5)
Eosinophils Relative: 0 %
HCT: 44.9 % (ref 36.0–46.0)
Hemoglobin: 15 g/dL (ref 12.0–15.0)
Immature Granulocytes: 1 %
Lymphocytes Relative: 14 %
Lymphs Abs: 2.3 10*3/uL (ref 0.7–4.0)
MCH: 33.2 pg (ref 26.0–34.0)
MCHC: 33.4 g/dL (ref 30.0–36.0)
MCV: 99.3 fL (ref 80.0–100.0)
Monocytes Absolute: 1 10*3/uL (ref 0.1–1.0)
Monocytes Relative: 6 %
Neutro Abs: 12.7 10*3/uL — ABNORMAL HIGH (ref 1.7–7.7)
Neutrophils Relative %: 79 %
Platelets: 283 10*3/uL (ref 150–400)
RBC: 4.52 MIL/uL (ref 3.87–5.11)
RDW: 12 % (ref 11.5–15.5)
WBC: 16.2 10*3/uL — ABNORMAL HIGH (ref 4.0–10.5)
nRBC: 0 % (ref 0.0–0.2)

## 2022-02-11 LAB — URINALYSIS, ROUTINE W REFLEX MICROSCOPIC
Bilirubin Urine: NEGATIVE
Glucose, UA: NEGATIVE mg/dL
Hgb urine dipstick: NEGATIVE
Ketones, ur: NEGATIVE mg/dL
Leukocytes,Ua: NEGATIVE
Nitrite: NEGATIVE
Protein, ur: NEGATIVE mg/dL
Specific Gravity, Urine: 1.014 (ref 1.005–1.030)
pH: 7 (ref 5.0–8.0)

## 2022-02-11 LAB — NOVEL CORONAVIRUS, NAA: SARS-CoV-2, NAA: NOT DETECTED

## 2022-02-11 LAB — POC URINE PREG, ED: Preg Test, Ur: NEGATIVE

## 2022-02-11 MED ORDER — FENTANYL CITRATE PF 50 MCG/ML IJ SOSY
12.5000 ug | PREFILLED_SYRINGE | Freq: Once | INTRAMUSCULAR | Status: AC
  Administered 2022-02-11: 12.5 ug via INTRAVENOUS
  Filled 2022-02-11: qty 1

## 2022-02-11 MED ORDER — IOHEXOL 350 MG/ML SOLN
80.0000 mL | Freq: Once | INTRAVENOUS | Status: AC | PRN
  Administered 2022-02-11: 80 mL via INTRAVENOUS

## 2022-02-11 MED ORDER — DICYCLOMINE HCL 20 MG PO TABS: 20.0000 mg | ORAL_TABLET | Freq: Two times a day (BID) | ORAL | 0 refills | Status: DC

## 2022-02-11 MED ORDER — MORPHINE SULFATE (PF) 2 MG/ML IV SOLN: 2.0000 mg | Freq: Once | INTRAVENOUS | Status: DC

## 2022-02-11 NOTE — ED Provider Notes (Signed)
[facility] EMERGENCY DEPARTMENT Provider Note   CSN: [id number] Arrival date & time: 02/11/22  8469     History Chief Complaint  Patient presents with   Abdominal Pain    Destiny Petty is a 41 y.o. female with history of bipolar, hyperlipidemia, GERD, hypertension who presents to the emergency department with suprapubic and right lower quadrant abdominal pain that started after urinating this morning.  Patient states her pain was so severe that she was unable to get off the toilet.  Pain is currently improving.  She describes this as a sharp sensation.  Surgical history does include cholecystectomy.  No other surgeries.  Intermittent fever and chills however she has been battling a viral upper respiratory illness over the last several days.  No diarrhea.  Mild dysuria.  No vaginal symptoms.   Abdominal Pain     Home Medications Prior to Admission medications   Medication Sig Start Date End Date Taking? Authorizing Provider  dicyclomine (BENTYL) 20 MG tablet Take 1 tablet (20 mg total) by mouth 2 (two) times daily. 02/11/22  Yes Meredeth Ide, Delio Slates M, PA-C  albuterol (PROAIR HFA) 108 (90 Base) MCG/ACT inhaler INHALE 1 OR 2 PUFFS INTO THE LUNGS EVERY 6 HOURS AS NEEDED FOR WHEEZING OR SHORTNESS OF BREATH 02/10/22   Wallis Bamberg, PA-C  buPROPion (WELLBUTRIN XL) 300 MG 24 hr tablet Take 1 tablet (300 mg total) by mouth daily. 09/07/21   Heather Roberts, NP  famotidine (PEPCID) 20 MG tablet One after supper 10/30/21   Kerri Perches, MD  fenofibrate (TRICOR) 145 MG tablet Take 1 tablet (145 mg total) by mouth daily. 09/10/21   Heather Roberts, NP  fluticasone (FLONASE) 50 MCG/ACT nasal spray Place 2 sprays into both nostrils daily. 02/10/22   Wallis Bamberg, PA-C  hydrochlorothiazide (MICROZIDE) 12.5 MG capsule TAKE ONE TABLET BY MOUTH ONCE DAILY. 10/30/21   Kerri Perches, MD  levocetirizine (XYZAL) 5 MG tablet Take 1 tablet (5 mg total) by mouth every evening. 02/10/22   Wallis Bamberg, PA-C   Omega-3 Fatty Acids (FISH OIL) 1000 MG CAPS Take 3 capsules (3,000 mg total) by mouth daily. 10/30/21   Kerri Perches, MD  predniSONE (DELTASONE) 50 MG tablet Take 1 tablet (50 mg total) by mouth daily with breakfast. 02/10/22   Wallis Bamberg, PA-C  promethazine-dextromethorphan (PROMETHAZINE-DM) 6.25-15 MG/5ML syrup Take 5 mLs by mouth 4 (four) times daily as needed for cough. 02/10/22   Wallis Bamberg, PA-C  rizatriptan (MAXALT-MLT) 10 MG disintegrating tablet TAKE 1 TABLET BY MOUTH ONCE AS NEEDED FOR MIGRAINE. MAY REPEAT IN 2 HOURS IF NEEDED. 12/20/21   Anabel Halon, MD  topiramate (TOPAMAX) 100 MG tablet TAKE (1) TABLET BY MOUTH ONCE DAILY. 09/07/21   Heather Roberts, NP      Allergies    Bee venom, Latex, and Lisinopril    Review of Systems   Review of Systems  Gastrointestinal:  Positive for abdominal pain.  All other systems reviewed and are negative.  Physical Exam Updated Vital Signs BP 127/77    Pulse 85    Temp 98.1 F (36.7 C) (Oral)    Resp 20    Ht 5' (1.524 m)    Wt 103.4 kg    SpO2 97%    BMI 44.53 kg/m  Physical Exam Vitals and nursing note reviewed.  Constitutional:      General: She is not in acute distress.    Appearance: Normal appearance.  HENT:     Head:  Normocephalic and atraumatic.  Eyes:     General:        Right eye: No discharge.        Left eye: No discharge.  Cardiovascular:     Comments: Regular rate and rhythm.  S1/S2 are distinct without any evidence of murmur, rubs, or gallops.  Radial pulses are 2+ bilaterally.  Dorsalis pedis pulses are 2+ bilaterally.  No evidence of pedal edema. Pulmonary:     Comments: Clear to auscultation bilaterally.  Normal effort.  No respiratory distress.  No evidence of wheezes, rales, or rhonchi heard throughout. Abdominal:     General: Abdomen is flat. Bowel sounds are normal. There is no distension.     Tenderness: There is abdominal tenderness in the right lower quadrant and suprapubic area. There is no guarding or  rebound.  Musculoskeletal:        General: Normal range of motion.     Cervical back: Neck supple.  Skin:    General: Skin is warm and dry.     Findings: No rash.  Neurological:     General: No focal deficit present.     Mental Status: She is alert.  Psychiatric:        Mood and Affect: Mood normal.        Behavior: Behavior normal.    ED Results / Procedures / Treatments   Labs (all labs ordered are listed, but only abnormal results are displayed) Labs Reviewed  URINALYSIS, ROUTINE W REFLEX MICROSCOPIC - Abnormal; Notable for the following components:      Result Value   APPearance CLOUDY (*)    All other components within normal limits  COMPREHENSIVE METABOLIC PANEL - Abnormal; Notable for the following components:   Chloride 112 (*)    CO2 21 (*)    All other components within normal limits  CBC WITH DIFFERENTIAL/PLATELET - Abnormal; Notable for the following components:   WBC 16.2 (*)    Neutro Abs 12.7 (*)    Abs Immature Granulocytes 0.08 (*)    All other components within normal limits  POC URINE PREG, ED - Normal    EKG None  Radiology CT ABDOMEN PELVIS W CONTRAST  Result Date: 02/11/2022 CLINICAL DATA:  41 year old female with history of right lower quadrant abdominal pain. EXAM: CT ABDOMEN AND PELVIS WITH CONTRAST TECHNIQUE: Multidetector CT imaging of the abdomen and pelvis was performed using the standard protocol following bolus administration of intravenous contrast. RADIATION DOSE REDUCTION: This exam was performed according to the departmental dose-optimization program which includes automated exposure control, adjustment of the mA and/or kV according to patient size and/or use of iterative reconstruction technique. CONTRAST:  78mL OMNIPAQUE IOHEXOL 350 MG/ML SOLN COMPARISON:  CT of the abdomen and pelvis 06/06/2021. FINDINGS: Lower chest: Mild scarring in the inferior segment of the lingula. Hepatobiliary: No suspicious cystic or solid hepatic lesions. No  intra or extrahepatic biliary ductal dilatation. Status post cholecystectomy. Pancreas: No pancreatic mass. No pancreatic ductal dilatation. No pancreatic or peripancreatic fluid collections or inflammatory changes. Spleen: Unremarkable. Adrenals/Urinary Tract: Bilateral kidneys and bilateral adrenal glands are normal in appearance. No hydroureteronephrosis. Urinary bladder is normal in appearance. Stomach/Bowel: The appearance of the stomach is normal. There is no pathologic dilatation of small bowel or colon. A few scattered colonic diverticulae are noted, without surrounding inflammatory changes to suggest an acute diverticulitis at this time. Normal appendix. Vascular/Lymphatic: No significant atherosclerotic disease, aneurysm or dissection noted in the abdominal or pelvic vasculature. No lymphadenopathy noted in the  abdomen or pelvis. Reproductive: 2.5 x 2.3 cm low-attenuation lesion in the right ovary, presumably a dominant follicle. Uterus and ovaries left ovary are unremarkable in appearance. Other: Trace volume of free fluid in the cul-de-sac, likely physiologic in this young female patient. No larger volume of ascites. No pneumoperitoneum. Musculoskeletal: There are no aggressive appearing lytic or blastic lesions noted in the visualized portions of the skeleton. IMPRESSION: 1. Trace volume of free fluid in the cul-de-sac, presumably physiologic in this young female patient. 2. No other acute findings are noted elsewhere in the abdomen or pelvis to account for the patient's symptoms. Electronically Signed   By: Trudie Reed M.D.   On: 02/11/2022 11:58    Procedures Procedures    Medications Ordered in ED Medications  fentaNYL (SUBLIMAZE) injection 12.5 mcg (12.5 mcg Intravenous Given 02/11/22 1043)  iohexol (OMNIPAQUE) 350 MG/ML injection 80 mL (80 mLs Intravenous Contrast Given 02/11/22 1137)    ED Course/ Medical Decision Making/ A&P                           Medical Decision  Making Amount and/or Complexity of Data Reviewed Labs: ordered. Radiology: ordered.  Risk Prescription drug management.   This patient presents to the ED for concern of lower abdominal pain, this involves an extensive number of treatment options, and is a complaint that carries with it a high risk of complications and morbidity.  The differential diagnosis includes cystitis versus pyelonephritis, appendicitis, diverticulitis, I doubt ovarian cyst, torsion, and ectopic as this patient is not having any vaginal symptoms.  Vitals otherwise normal at initial evaluation apart from elevated blood pressure.   Co morbidities that complicate the patient evaluation  Hypertension Hyperlipidemia Bipolar   Additional history obtained:  Additional history obtained from old records and nursing note External records from outside source obtained and reviewed including urgent care visit from yesterday where she was seen evaluated for upper respiratory illness.  COVID was negative at that time.  Diagnosed with viral URI.   Lab Tests:  I Ordered, and personally interpreted labs.  The pertinent results include: CBC which revealed leukocytosis.  This is likely from her viral upper respiratory infection.  CMP did not show any significant abnormalities.  Urine pregnancy was negative.  Urine without infection.   Imaging Studies ordered:  I ordered imaging studies including CT abdomen pelvis with contrast I independently visualized and interpreted imaging which showed no acute findings.  No evidence of appendicitis or diverticulitis. I agree with the radiologist interpretation   Cardiac Monitoring:  The patient was maintained on a cardiac monitor.  I personally viewed and interpreted the cardiac monitored which showed an underlying rhythm of: Normal sinus rhythm   Medicines ordered and prescription drug management:  I ordered medication including fentanyl for pain Reevaluation of the patient after  these medicines showed that the patient improved I have reviewed the patients home medicines and have made adjustments as needed   Critical Interventions:  Pain control    Problem List / ED Course:  Lower abdominal pain.  It is unclear at this time the exact cause of her abdominal pain but emergent causes were ruled out including hepatobiliary disease, pancreatitis, appendicitis, diverticulitis, pyelonephritis.  I would low suspicion for cystitis at this time.  This could be her viral illness working its way out of her body as she does state this feels like a cramping sharp sensation.  I will likely prescribe her some Bentyl.  Strict turn precautions were discussed with the patient.  She states that her abdominal pain is better.  Overall vitals are normal and she is safe for discharge.   Reevaluation:  After the interventions noted above, I reevaluated the patient and found that they have :improved   Dispostion:  After consideration of the diagnostic results and the patients response to treatment, I feel that the patent would benefit from outpatient follow-up.  Prescription for Bentyl  Final Clinical Impression(s) / ED Diagnoses Final diagnoses:  Lower abdominal pain    Rx / DC Orders ED Discharge Orders          Ordered    dicyclomine (BENTYL) 20 MG tablet  2 times daily        02/11/22 1229              Honor LohFleming, Amed Datta PutnamM, New JerseyPA-C 02/11/22 1229    Bethann BerkshireZammit, Joseph, MD 02/12/22 1800

## 2022-02-11 NOTE — Discharge Instructions (Addendum)
I am unsure exactly what is causing her abdominal pain at this time but this could be from your viral illness.  All the emergent causes of abdominal pain were ruled out today.  Which is good news.  Please take Bentyl as needed for abdominal cramping.  Please drink plenty of fluids and stick to a bland diet for the next week.  Return to the emergency department for any worsening symptoms. ?

## 2022-02-11 NOTE — ED Triage Notes (Signed)
Patient states abdominal pain this morning when using the bathroom and was unable to get of the commode for 15 minutes. Patient states bilateral lower abdominal pain with worsening pain above pelvic region.  ?

## 2022-02-12 ENCOUNTER — Telehealth: Payer: Self-pay

## 2022-02-12 NOTE — Telephone Encounter (Signed)
Transition Care Management Follow-up Telephone Call ?Date of discharge and from where: 02/11/2022 Destiny Petty ?How have you been since you were released from the hospital? Pt stated she is doing better ?Any questions or concerns? No ? ?Items Reviewed: ?Did the pt receive and understand the discharge instructions provided? Yes  ?Medications obtained and verified? Yes  ?Other? No  ?Any new allergies since your discharge? No  ?Dietary orders reviewed? No ?Do you have support at home? Yes  ? ?Home Care and Equipment/Supplies: ?Were home health services ordered? not applicable ?If so, what is the name of the agency? N/A  ?Has the agency set up a time to come to the patient's home? not applicable ?Were any new equipment or medical supplies ordered?  No ?What is the name of the medical supply agency? N/A ?Were you able to get the supplies/equipment? not applicable ?Do you have any questions related to the use of the equipment or supplies? No ? ?Functional Questionnaire: (I = Independent and D = Dependent) ?ADLs: I ? ?Bathing/Dressing- I ? ?Meal Prep- I ? ?Eating- I ? ?Maintaining continence- I ? ?Transferring/Ambulation- I ? ?Managing Meds- I ? ?Follow up appointments reviewed: ? ?PCP Hospital f/u appt confirmed? Yes  Scheduled to see Dr. Allena Katz on 02/15/2022 @ 9am. ?Specialist Hospital f/u appt confirmed? No   ?Are transportation arrangements needed? No  ?If their condition worsens, is the pt aware to call PCP or go to the Emergency Dept.? Yes ?Was the patient provided with contact information for the PCP's office or ED? Yes ?Was to pt encouraged to call back with questions or concerns? Yes  ?

## 2022-02-15 ENCOUNTER — Ambulatory Visit: Payer: Medicaid Other | Admitting: Internal Medicine

## 2022-02-15 ENCOUNTER — Encounter: Payer: Self-pay | Admitting: Internal Medicine

## 2022-02-15 ENCOUNTER — Other Ambulatory Visit: Payer: Self-pay

## 2022-02-15 VITALS — BP 132/84 | HR 86 | Resp 18 | Ht 60.0 in | Wt 226.0 lb

## 2022-02-15 DIAGNOSIS — J069 Acute upper respiratory infection, unspecified: Secondary | ICD-10-CM

## 2022-02-15 DIAGNOSIS — G43719 Chronic migraine without aura, intractable, without status migrainosus: Secondary | ICD-10-CM

## 2022-02-15 DIAGNOSIS — J4530 Mild persistent asthma, uncomplicated: Secondary | ICD-10-CM

## 2022-02-15 MED ORDER — TIOTROPIUM BROMIDE-OLODATEROL 2.5-2.5 MCG/ACT IN AERS: 2.0000 | INHALATION_SPRAY | Freq: Every day | RESPIRATORY_TRACT | 5 refills | Status: DC

## 2022-02-15 MED ORDER — AZITHROMYCIN 250 MG PO TABS: ORAL_TABLET | ORAL | 0 refills | Status: AC

## 2022-02-15 NOTE — Assessment & Plan Note (Signed)
Uncontrolled with albuterol as needed ?Added Stiolto inhaler for maintenance treatment ?Needs to follow-up with pulmonology ?

## 2022-02-15 NOTE — Progress Notes (Signed)
Established Patient Office Visit  Subjective:  Patient ID: Destiny Petty, female    DOB: Feb 18, 1981  Age: 41 y.o. MRN: [id number]  CC:  Chief Complaint  Patient presents with   Follow-up    3 month follow up pt has fever on and off cough chills covid neg on 02-09-22 went to [facility] er given prednisone still feels bad    HPI Destiny Petty is a 41 y.o. female with past medical asthma, GERD, migraine, HLD, depression and morbid obesity who history of  presents for f/u of her chronic medical conditions.  She complains of cough, fever and chills for about a week now.  She has been to ER twice, and was given prednisone and Promethazine DM syrup, which has not helped much.  She also complains of intermittent dyspnea and wheezing, for which she has used albuterol inhaler.  She also reports history of asthma and has seen Dr Melvyn Novas for it.  She is trying to lose weight by following low-carb diet, but has not been able to lose weight recently.  She has lost from 280 lbs in the past with Phentermine, but it has been static since then.  She asks for any other medical treatment for weight loss.  I had discussion about GLP-1 agonist therapy, but it would not be covered by her insurance.  I also offered her bariatric surgery referral as she could be a candidate for gastric sleeve surgery.  She would think about it for now.    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 Scheduled for Cholecystectomy   GERD (gastroesophageal reflux disease)    Hemorrhoids 02/10/2015   Hiatal hernia    Hyperlipidemia 04/09/2011   Hypertension    no meds   Injury of digital nerve of right little finger 08/23/2020   Laceration of blood vessel of right little finger 08/23/2020   Laceration of finger of left hand without damage to nail    left small finger   Menorrhagia with regular cycle 04/10/2016   Migraine 01/15/2016   Overview:  Overview:  IMO Load 2016 R1.3   Muscle spasm of back 03/13/2016   Numbness and tingling in left hand 08/19/2016   Obesity    Ovarian cyst 12/18/2016   Perianal wart 02/10/2015   Stress 02/14/2016   Weight loss counseling, encounter for 03/13/2016    Past Surgical History:  Procedure Laterality Date   ABLATION     CESAREAN SECTION     CESAREAN SECTION N/A    Phreesia 05/23/2020   CHOLECYSTECTOMY     COLONOSCOPY     DILATATION AND CURETTAGE/HYSTEROSCOPY WITH MINERVA N/A 08/31/2019   Procedure: DILATATION AND CURETTAGE (no specimen) /HYSTEROSCOPY WITH MINERVA ENDOMETRIAL ABLATION;  Surgeon: Jonnie Kind, MD;  Location: [facility];  Service: Gynecology;  Laterality: N/A;   ESOPHAGOGASTRODUODENOSCOPY ENDOSCOPY     TENDON EXPLORATION  Left 08/10/2020   Procedure: LEFT SMALL FINER EXPLORATION, REPAIR OF TENDON, ARTERY, NERVE;  Surgeon: Leanora Cover, MD;  Location: [place];  Service: Orthopedics;  Laterality: Left;   TUBAL LIGATION      Family History  Problem Relation Age of Onset   Diabetes Mother    Kidney failure Mother    Early death Mother 94       diabetes complications   Cancer Father 20       colon cancer   Depression Sister    Hypertension Sister    Anxiety disorder Sister    Other Sister        back problems   ADD / ADHD Daughter    Other Daughter        behavioral issues   ODD Daughter    Asthma Son    Cancer Maternal Grandmother        breast   Seizures Maternal Grandmother     Lupus Other    Breast cancer Maternal Aunt     Social History   Socioeconomic History   Marital status: Legally Separated    Spouse name: Not on file   Number of children: 2   Years of education: 14   Highest education level: Not on file  Occupational History   Not on file  Tobacco Use   Smoking status: Every Day    Packs/day: 1.00    Years: 26.00    Pack years: 26.00    Types: Cigarettes   Smokeless tobacco: Never   Tobacco comments:    1/4 ppd 05/30/21  Vaping Use   Vaping Use: Never used  Substance and Sexual Activity   Alcohol use: Yes    Comment: occ   Drug use: No   Sexual activity: Not Currently    Birth control/protection: Surgical    Comment: tubal  Other Topics Concern   Not on file  Social History Narrative   Legally separated right now   2 children: son 79 and daughter 28 lives with her      2 dogs: macho, mary jane   2 birds:    1 cats: boghgie       Enjoys: spending time with kids, outside things      Diet: eat all food groups    Caffeine: coffee and soda daily   Water: 4 cups daily      Wears seat belt   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   No weapons        Social Determinants of Radio broadcast assistant Strain: Not on file  Food Insecurity: Not on file  Transportation Needs: Not on file  Physical Activity: Not on file  Stress: Not on file  Social Connections: Not on file  Intimate Partner Violence: Not on file    Outpatient Medications Prior to Visit  Medication Sig Dispense Refill   albuterol (PROAIR HFA) 108 (90 Base) MCG/ACT inhaler INHALE 1 OR 2 PUFFS INTO THE LUNGS EVERY 6 HOURS AS NEEDED FOR WHEEZING OR SHORTNESS OF BREATH 18 g 0   buPROPion (WELLBUTRIN XL) 300 MG 24 hr tablet Take 1 tablet (300 mg total) by mouth daily. 90 tablet 1   dicyclomine (BENTYL) 20 MG tablet Take 1 tablet (20 mg total) by mouth 2 (two) times daily. 20 tablet 0   famotidine (PEPCID) 20 MG tablet One after supper 30 tablet 11    fenofibrate (TRICOR) 145 MG tablet Take 1 tablet (  145 mg total) by mouth daily. 90 tablet 3   fluticasone (FLONASE) 50 MCG/ACT nasal spray Place 2 sprays into both nostrils daily. 16 g 2   hydrochlorothiazide (MICROZIDE) 12.5 MG capsule TAKE ONE TABLET BY MOUTH ONCE DAILY. 30 capsule 2   levocetirizine (XYZAL) 5 MG tablet Take 1 tablet (5 mg total) by mouth every evening. 90 tablet 0   Omega-3 Fatty Acids (FISH OIL) 1000 MG CAPS Take 3 capsules (3,000 mg total) by mouth daily. 270 capsule 3   promethazine-dextromethorphan (PROMETHAZINE-DM) 6.25-15 MG/5ML syrup Take 5 mLs by mouth 4 (four) times daily as needed for cough. 200 mL 0   rizatriptan (MAXALT-MLT) 10 MG disintegrating tablet TAKE 1 TABLET BY MOUTH ONCE AS NEEDED FOR MIGRAINE. MAY REPEAT IN 2 HOURS IF NEEDED. 10 tablet 0   topiramate (TOPAMAX) 100 MG tablet TAKE (1) TABLET BY MOUTH ONCE DAILY. 90 tablet 3   predniSONE (DELTASONE) 50 MG tablet Take 1 tablet (50 mg total) by mouth daily with breakfast. (Patient not taking: Reported on 02/15/2022) 5 tablet 0   No facility-administered medications prior to visit.    Allergies  Allergen Reactions   Bee Venom Shortness Of Breath   Latex Itching   Lisinopril Cough    coughing    ROS Review of Systems  Constitutional:  Positive for chills, fatigue and fever.  HENT:  Negative for congestion, sinus pressure and sore throat.   Eyes:  Negative for pain and discharge.  Respiratory:  Positive for cough and shortness of breath.   Cardiovascular:  Negative for chest pain and palpitations.  Gastrointestinal:  Negative for abdominal pain, diarrhea, nausea and vomiting.  Endocrine: Negative for polydipsia and polyuria.  Genitourinary:  Negative for dysuria and hematuria.  Musculoskeletal:  Negative for neck pain and neck stiffness.  Skin:  Negative for rash.  Neurological:  Negative for dizziness and weakness.  Psychiatric/Behavioral:  Negative for agitation and behavioral problems.       Objective:    Physical Exam Vitals reviewed.  Constitutional:      General: She is not in acute distress.    Appearance: She is obese. She is not diaphoretic.  HENT:     Head: Normocephalic and atraumatic.     Nose: Congestion present.     Mouth/Throat:     Mouth: Mucous membranes are moist.     Pharynx: No posterior oropharyngeal erythema.  Eyes:     General: No scleral icterus.    Extraocular Movements: Extraocular movements intact.  Cardiovascular:     Rate and Rhythm: Normal rate and regular rhythm.     Pulses: Normal pulses.     Heart sounds: Normal heart sounds. No murmur heard. Pulmonary:     Breath sounds: Wheezing (Mild, b/l) present. No rales.  Abdominal:     Palpations: Abdomen is soft.     Tenderness: There is no abdominal tenderness.  Musculoskeletal:     Cervical back: Neck supple. No tenderness.     Right lower leg: No edema.     Left lower leg: No edema.  Skin:    General: Skin is warm.     Findings: No rash.  Neurological:     General: No focal deficit present.     Mental Status: She is alert and oriented to person, place, and time.  Psychiatric:        Mood and Affect: Mood normal.        Behavior: Behavior normal.    BP 132/84 (BP Location: Left Arm, Patient  Position: Sitting, Cuff Size: Normal)    Pulse 86    Resp 18    Ht 5' (1.524 m)    Wt 226 lb (102.5 kg)    SpO2 97%    BMI 44.14 kg/m  Wt Readings from Last 3 Encounters:  02/15/22 226 lb (102.5 kg)  02/11/22 228 lb (103.4 kg)  12/04/21 228 lb (103.4 kg)    Lab Results  Component Value Date   TSH 2.710 11/22/2021   Lab Results  Component Value Date   WBC 16.2 (H) 02/11/2022   HGB 15.0 02/11/2022   HCT 44.9 02/11/2022   MCV 99.3 02/11/2022   PLT 283 02/11/2022   Lab Results  Component Value Date   NA 139 02/11/2022   K 3.8 02/11/2022   CO2 21 (L) 02/11/2022   GLUCOSE 78 02/11/2022   BUN 12 02/11/2022   CREATININE 0.71 02/11/2022   BILITOT 0.3 02/11/2022   ALKPHOS 54  02/11/2022   AST 18 02/11/2022   ALT 17 02/11/2022   PROT 7.1 02/11/2022   ALBUMIN 3.9 02/11/2022   CALCIUM 9.0 02/11/2022   ANIONGAP 6 02/11/2022   EGFR 89 11/22/2021   Lab Results  Component Value Date   CHOL 171 11/22/2021   Lab Results  Component Value Date   HDL 48 11/22/2021   Lab Results  Component Value Date   LDLCALC 105 (H) 11/22/2021   Lab Results  Component Value Date   TRIG 100 11/22/2021   Lab Results  Component Value Date   CHOLHDL 4.8 06/01/2020   Lab Results  Component Value Date   HGBA1C 5.3 11/22/2021      Assessment & Plan:   Problem List Items Addressed This Visit        URTI (acute upper respiratory infection)    -  Primary ER chart reviewed, including imaging Started azithromycin as she has persistent symptoms despite symptomatic treatment Continue albuterol inhaler as needed for dyspnea or wheezing Promethazine DM syrup for cough   Relevant Medications   azithromycin (ZITHROMAX) 250 MG tablet   Cardiovascular and Mediastinum   Migraine    Will controlled with topiramate and as needed Maxalt        Respiratory   Asthma, mild persistent    Uncontrolled with albuterol as needed Added Stiolto inhaler for maintenance treatment Needs to follow-up with pulmonology      Relevant Medications   Tiotropium Bromide-Olodaterol 2.5-2.5 MCG/ACT AERS     Other   Morbid obesity due to excess calories ([place])    Has tried low-carb diet and exercise regimen Has been on phentermine in the past, unable to lose weight further Not able to start GLP-1 agonist due to insurance coverage concern Advised to think about bariatric surgery referral for now       Meds ordered this encounter  Medications   azithromycin (ZITHROMAX) 250 MG tablet    Sig: Take 2 tablets on day 1, then 1 tablet daily on days 2 through 5    Dispense:  6 tablet    Refill:  0   Tiotropium Bromide-Olodaterol 2.5-2.5 MCG/ACT AERS    Sig: Inhale 2 puffs into the lungs  daily.    Dispense:  4 g    Refill:  5    Follow-up: Return in about 4 months (around 06/17/2022) for Asthma and HLD.    Lindell Spar, MD

## 2022-02-15 NOTE — Patient Instructions (Signed)
Please start taking Azithromycin as prescribed. ? ?Please start using Stiolto inhaler as prescribed. Please use Albuterol inhaler as needed for shortness of breath or wheezing. ?

## 2022-02-15 NOTE — Assessment & Plan Note (Signed)
Has tried low-carb diet and exercise regimen ?Has been on phentermine in the past, unable to lose weight further ?Not able to start GLP-1 agonist due to insurance coverage concern ?Advised to think about bariatric surgery referral for now ?

## 2022-02-15 NOTE — Assessment & Plan Note (Signed)
Will controlled with topiramate and as needed Maxalt ?

## 2022-02-20 ENCOUNTER — Ambulatory Visit: Payer: Medicaid Other | Admitting: Internal Medicine

## 2022-03-26 ENCOUNTER — Other Ambulatory Visit: Payer: Self-pay | Admitting: Family Medicine

## 2022-03-26 DIAGNOSIS — I10 Essential (primary) hypertension: Secondary | ICD-10-CM

## 2022-04-01 ENCOUNTER — Other Ambulatory Visit: Payer: Self-pay | Admitting: Family Medicine

## 2022-04-12 ENCOUNTER — Ambulatory Visit: Payer: Medicaid Other | Admitting: Nurse Practitioner

## 2022-04-12 ENCOUNTER — Encounter: Payer: Self-pay | Admitting: Nurse Practitioner

## 2022-04-12 VITALS — BP 130/85 | HR 98 | Ht 60.0 in | Wt 228.1 lb

## 2022-04-12 DIAGNOSIS — K649 Unspecified hemorrhoids: Secondary | ICD-10-CM

## 2022-04-12 DIAGNOSIS — I10 Essential (primary) hypertension: Secondary | ICD-10-CM | POA: Diagnosis not present

## 2022-04-12 MED ORDER — HYDROCORT-PRAMOXINE (PERIANAL) 1-1 % EX FOAM: 1.0000 | Freq: Two times a day (BID) | CUTANEOUS | 0 refills | Status: DC

## 2022-04-12 MED ORDER — HYDROCORTISONE 1 % EX CREA: 1.0000 "application " | TOPICAL_CREAM | Freq: Two times a day (BID) | CUTANEOUS | 0 refills | Status: DC

## 2022-04-12 NOTE — Assessment & Plan Note (Addendum)
Hydrocortisone- pramoxine Proctofoam ordered ?Advised to follow-up with GI for possible surgery ?Take with MiraLAX or OTC stool softener as needed to soften. ?Use of sits baths encouraged ?

## 2022-04-12 NOTE — Progress Notes (Signed)
pro ? ?DOIS GENTRY     MRN: [id number]      DOB: 17-Apr-1981 ? ? ?HPI ?Destiny Petty is here for c/o hemorrhoids flare up , has seen GI, Dr Arnoldo Morale in the past , they dicussed surgery with her , but she was not ready for surgery at that time, stated that she went to the beach last week got in the tub which led to her hemorrhoids flare up. Hemmoroids is painful but not bleeding, denies fever chills, malaise ? ? ?ROS ?Denies recent fever or chills. ?Denies sinus pressure, nasal congestion, ear pain or sore throat. ?Denies chest congestion, productive cough or wheezing. ?Denies chest pains, palpitations and leg swelling ?Denies abdominal pain, nausea, vomiting,diarrhea or constipation.   ?Denies dysuria, frequency, hesitancy or incontinence. ?Denies joint pain, swelling and limitation in mobility. ?Denies headaches, seizures, numbness, or tingling. ?Denies depression, anxiety or insomnia. ? ? ? ?PE ? ?BP 130/85   Pulse 98   Ht 5' (1.524 m)   Wt 228 lb 1.3 oz (103.5 kg)   SpO2 94%   BMI 44.54 kg/m?  ? ?Patient alert and oriented and in no cardiopulmonary distress. ? ? ?Chest: Clear to auscultation bilaterally. ? ?CVS: S1, S2 no murmurs, no S3.Regular rate. ? ?ABD: Soft non tender.  ? ?Ext: No edema ? ?MS: Adequate ROM spine, shoulders, hips and knees. ?  ?GU.  None bleeding tender hemorrhoid noted in the anal area on examination ? ?Psych: Good eye contact, normal affect. Memory intact not anxious or depressed appearing. ? ? ?Assessment & Plan ? ?Essential hypertension ?BP Readings from Last 3 Encounters:  ?04/12/22 130/85  ?02/15/22 132/84  ?02/11/22 117/71  ?Chronic condition well-controlled on hydrochlorothiazide 12.5 mg daily ?DASH diet advised engage in daily exercises at least 150 minutes weekly ?Continue current medication ? ?Hemorrhoid ?Hydrocortisone- pramoxine Proctofoam ordered ?Advised to follow-up with GI for possible surgery ?Take with MiraLAX or OTC stool softener as needed to  soften. ?Use of sits baths encouraged  ?

## 2022-04-12 NOTE — Patient Instructions (Addendum)
Please use Hydrocortisone- pramoxine rectal foam .Apply 1 application. topically 2 (two) times daily  ? ? ?It is important that you exercise regularly at least 30 minutes 5 times a week.  ?Think about what you will eat, plan ahead. ?Choose " clean, green, fresh or frozen" over canned, processed or packaged foods which are more sugary, salty and fatty. ?70 to 75% of food eaten should be vegetables and fruit. ?Three meals at set times with snacks allowed between meals, but they must be fruit or vegetables. ?Aim to eat over a 12 hour period , example 7 am to 7 pm, and STOP after  your last meal of the day. ?Drink water,generally about 64 ounces per day, no other drink is as healthy. Fruit juice is best enjoyed in a healthy way, by EATING the fruit. ? ?Thanks for choosing [facility], we consider it a privelige to serve you.  ?

## 2022-04-12 NOTE — Assessment & Plan Note (Signed)
BP Readings from Last 3 Encounters:  ?04/12/22 130/85  ?02/15/22 132/84  ?02/11/22 117/71  ?Chronic condition well-controlled on hydrochlorothiazide 12.5 mg daily ?DASH diet advised engage in daily exercises at least 150 minutes weekly ?Continue current medication ?

## 2022-04-25 ENCOUNTER — Encounter: Payer: Self-pay | Admitting: General Surgery

## 2022-04-25 ENCOUNTER — Other Ambulatory Visit: Payer: Self-pay | Admitting: Internal Medicine

## 2022-04-25 ENCOUNTER — Ambulatory Visit: Payer: Medicaid Other | Admitting: General Surgery

## 2022-04-25 VITALS — BP 126/57 | HR 78 | Temp 98.3°F | Resp 14 | Ht 60.0 in | Wt 230.0 lb

## 2022-04-25 DIAGNOSIS — K649 Unspecified hemorrhoids: Secondary | ICD-10-CM | POA: Diagnosis not present

## 2022-04-25 DIAGNOSIS — K644 Residual hemorrhoidal skin tags: Secondary | ICD-10-CM

## 2022-04-26 NOTE — H&P (Signed)
Destiny Petty; [id number]; 27-May-1981   HPI Patient is a 41 year old white female who was referred back to my care by Dr. Allena Katz for evaluation treatment of hemorrhoidal disease.  Patient underwent incision of a thrombosed hemorrhoid in the emergency room in December 2022.  She now presents with external hemorrhoidal irritation, especially after sitting in a hot tub.  She does have pruritus ani specially after wiping herself.  No significant blood is noted. 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 Scheduled for Cholecystectomy   GERD (gastroesophageal reflux disease)    Hemorrhoids 02/10/2015   Hiatal hernia    Hyperlipidemia 04/09/2011   Hypertension    no meds   Injury of digital nerve of right little finger 08/23/2020   Laceration of blood vessel of right little finger 08/23/2020   Laceration of finger of left hand without damage to nail    left small finger   Menorrhagia with regular cycle 04/10/2016   Migraine 01/15/2016   Overview:  Overview:  IMO Load 2016 R1.3   Muscle spasm of back 03/13/2016   Numbness and tingling in left hand 08/19/2016   Obesity    Ovarian cyst 12/18/2016   Perianal wart 02/10/2015   Stress 02/14/2016   Weight loss counseling, encounter for 03/13/2016    Past Surgical History:  Procedure Laterality Date   ABLATION     CESAREAN SECTION     CESAREAN SECTION N/A    Phreesia 05/23/2020   CHOLECYSTECTOMY      COLONOSCOPY     DILATATION AND CURETTAGE/HYSTEROSCOPY WITH MINERVA N/A 08/31/2019   Procedure: DILATATION AND CURETTAGE (no specimen) /HYSTEROSCOPY WITH MINERVA ENDOMETRIAL ABLATION;  Surgeon: Tilda Burrow, MD;  Location: [facility];  Service: Gynecology;  Laterality: N/A;   ESOPHAGOGASTRODUODENOSCOPY ENDOSCOPY     TENDON EXPLORATION Left 08/10/2020   Procedure: LEFT SMALL FINER EXPLORATION, REPAIR OF TENDON, ARTERY, NERVE;  Surgeon: Betha Loa, MD;  Location: [facility];  Service: Orthopedics;  Laterality: Left;   TUBAL LIGATION      Family History  Problem Relation Age of Onset   Diabetes Mother    Kidney failure Mother    Early death Mother 65       diabetes complications   Cancer Father 75       colon cancer   Depression Sister    Hypertension Sister    Anxiety disorder Sister    Other Sister        back problems   ADD / ADHD Daughter    Other Daughter        behavioral issues   ODD Daughter    Asthma Son    Cancer Maternal Grandmother        breast   Seizures Maternal Grandmother    Lupus Other    Breast cancer Maternal Aunt     Current Outpatient Medications on File Prior  to Visit  Medication Sig Dispense Refill   albuterol (PROAIR HFA) 108 (90 Base) MCG/ACT inhaler INHALE 1 OR 2 PUFFS INTO THE LUNGS EVERY 6 HOURS AS NEEDED FOR WHEEZING OR SHORTNESS OF BREATH 18 g 0   buPROPion (WELLBUTRIN XL) 300 MG 24 hr tablet Take 1 tablet (300 mg total) by mouth daily. 90 tablet 1   dicyclomine (BENTYL) 20 MG tablet TAKE 1 TABLET BY MOUTH TWICE DAILY. 20 tablet 0   famotidine (PEPCID) 20 MG tablet One after supper 30 tablet 11   fenofibrate (TRICOR) 145 MG tablet Take 1 tablet (145 mg total) by mouth daily. 90 tablet 3   fluticasone (FLONASE) 50 MCG/ACT nasal spray INSTILL 2 SPRAYS INTO BOTH NOSTRILS DAILY. 16 g 0   hydrochlorothiazide (MICROZIDE) 12.5 MG capsule TAKE ONE CAPSULE BY MOUTH ONCE DAILY. 30 capsule 0   hydrocortisone-pramoxine (PROCTOFOAM-HC) rectal  foam Place 1 applicator rectally 2 (two) times daily. 10 g 0   levocetirizine (XYZAL) 5 MG tablet Take 1 tablet (5 mg total) by mouth every evening. 90 tablet 0   Omega-3 Fatty Acids (FISH OIL) 1000 MG CAPS Take 3 capsules (3,000 mg total) by mouth daily. 270 capsule 3   Tiotropium Bromide-Olodaterol 2.5-2.5 MCG/ACT AERS Inhale 2 puffs into the lungs daily. 4 g 5   topiramate (TOPAMAX) 100 MG tablet TAKE (1) TABLET BY MOUTH ONCE DAILY. 90 tablet 3   promethazine-dextromethorphan (PROMETHAZINE-DM) 6.25-15 MG/5ML syrup Take 5 mLs by mouth 4 (four) times daily as needed for cough. (Patient not taking: Reported on 04/12/2022) 200 mL 0   No current facility-administered medications on file prior to visit.    Allergies  Allergen Reactions   Bee Venom Shortness Of Breath   Latex Itching   Lisinopril Cough    coughing    Social History   Substance and Sexual Activity  Alcohol Use Yes   Comment: occ    Social History   Tobacco Use  Smoking Status Every Day   Packs/day: 1.00   Years: 26.00   Pack years: 26.00   Types: Cigarettes  Smokeless Tobacco Never  Tobacco Comments   1/4 ppd 05/30/21    Review of Systems  Constitutional:  Positive for malaise/fatigue.  HENT:  Positive for sinus pain.   Eyes:  Positive for blurred vision.  Respiratory:  Positive for cough.   Cardiovascular: Negative.   Gastrointestinal:  Positive for heartburn.  Genitourinary: Negative.   Musculoskeletal:  Positive for joint pain.  Skin: Negative.   Neurological: Negative.   Endo/Heme/Allergies: Negative.   Psychiatric/Behavioral: Negative.     Objective   Vitals:   04/25/22 0946  BP: (!) 126/57  Pulse: 78  Resp: 14  Temp: 98.3 F (36.8 C)  SpO2: 94%    Physical Exam Vitals reviewed.  Constitutional:      Appearance: Normal appearance. She is obese. She is not ill-appearing.  HENT:     Head: Normocephalic and atraumatic.  Cardiovascular:     Rate and Rhythm: Normal rate and regular  rhythm.     Heart sounds: Normal heart sounds. No murmur heard.   No friction rub. No gallop.  Pulmonary:     Effort: Pulmonary effort is normal. No respiratory distress.     Breath sounds: Normal breath sounds. No stridor. No wheezing, rhonchi or rales.  Abdominal:     General: Bowel sounds are normal. There is no distension.     Palpations: Abdomen is soft. There is no mass.     Tenderness: There is  no abdominal tenderness. There is no guarding or rebound.     Hernia: No hernia is present.  Genitourinary:    Comments: Prominent external hemorrhoid at the 11 o'clock position as well as external hemorrhoidal skin tags at the 6 o'clock position.  A mild internal hemorrhoid is noted at the 11 o'clock position also.  No active bleeding noted.  Sphincter tone is normal. Skin:    General: Skin is warm and dry.  Neurological:     Mental Status: She is alert and oriented to person, place, and time.   Previous office visit reviewed Assessment  Hemorrhoidal disease Plan  Patient is scheduled for a hemorrhoidectomy which may involve 2 columns of 05/10/2022.  The risks and benefits of the procedure including bleeding, infection, pain, incontinence, and the possibility of recurrent hemorrhoidal disease were fully explained to the patient, who gave informed consent.

## 2022-04-26 NOTE — Progress Notes (Signed)
Destiny Petty; [id number]; 27-May-1981   HPI Patient is a 41 year old white female who was referred back to my care by Dr. Allena Katz for evaluation treatment of hemorrhoidal disease.  Patient underwent incision of a thrombosed hemorrhoid in the emergency room in December 2022.  She now presents with external hemorrhoidal irritation, especially after sitting in a hot tub.  She does have pruritus ani specially after wiping herself.  No significant blood is noted. 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 Scheduled for Cholecystectomy   GERD (gastroesophageal reflux disease)    Hemorrhoids 02/10/2015   Hiatal hernia    Hyperlipidemia 04/09/2011   Hypertension    no meds   Injury of digital nerve of right little finger 08/23/2020   Laceration of blood vessel of right little finger 08/23/2020   Laceration of finger of left hand without damage to nail    left small finger   Menorrhagia with regular cycle 04/10/2016   Migraine 01/15/2016   Overview:  Overview:  IMO Load 2016 R1.3   Muscle spasm of back 03/13/2016   Numbness and tingling in left hand 08/19/2016   Obesity    Ovarian cyst 12/18/2016   Perianal wart 02/10/2015   Stress 02/14/2016   Weight loss counseling, encounter for 03/13/2016    Past Surgical History:  Procedure Laterality Date   ABLATION     CESAREAN SECTION     CESAREAN SECTION N/A    Phreesia 05/23/2020   CHOLECYSTECTOMY      COLONOSCOPY     DILATATION AND CURETTAGE/HYSTEROSCOPY WITH MINERVA N/A 08/31/2019   Procedure: DILATATION AND CURETTAGE (no specimen) /HYSTEROSCOPY WITH MINERVA ENDOMETRIAL ABLATION;  Surgeon: Tilda Burrow, MD;  Location: [facility];  Service: Gynecology;  Laterality: N/A;   ESOPHAGOGASTRODUODENOSCOPY ENDOSCOPY     TENDON EXPLORATION Left 08/10/2020   Procedure: LEFT SMALL FINER EXPLORATION, REPAIR OF TENDON, ARTERY, NERVE;  Surgeon: Betha Loa, MD;  Location: [facility];  Service: Orthopedics;  Laterality: Left;   TUBAL LIGATION      Family History  Problem Relation Age of Onset   Diabetes Mother    Kidney failure Mother    Early death Mother 65       diabetes complications   Cancer Father 75       colon cancer   Depression Sister    Hypertension Sister    Anxiety disorder Sister    Other Sister        back problems   ADD / ADHD Daughter    Other Daughter        behavioral issues   ODD Daughter    Asthma Son    Cancer Maternal Grandmother        breast   Seizures Maternal Grandmother    Lupus Other    Breast cancer Maternal Aunt     Current Outpatient Medications on File Prior  to Visit  Medication Sig Dispense Refill   albuterol (PROAIR HFA) 108 (90 Base) MCG/ACT inhaler INHALE 1 OR 2 PUFFS INTO THE LUNGS EVERY 6 HOURS AS NEEDED FOR WHEEZING OR SHORTNESS OF BREATH 18 g 0   buPROPion (WELLBUTRIN XL) 300 MG 24 hr tablet Take 1 tablet (300 mg total) by mouth daily. 90 tablet 1   dicyclomine (BENTYL) 20 MG tablet TAKE 1 TABLET BY MOUTH TWICE DAILY. 20 tablet 0   famotidine (PEPCID) 20 MG tablet One after supper 30 tablet 11   fenofibrate (TRICOR) 145 MG tablet Take 1 tablet (145 mg total) by mouth daily. 90 tablet 3   fluticasone (FLONASE) 50 MCG/ACT nasal spray INSTILL 2 SPRAYS INTO BOTH NOSTRILS DAILY. 16 g 0   hydrochlorothiazide (MICROZIDE) 12.5 MG capsule TAKE ONE CAPSULE BY MOUTH ONCE DAILY. 30 capsule 0   hydrocortisone-pramoxine (PROCTOFOAM-HC) rectal  foam Place 1 applicator rectally 2 (two) times daily. 10 g 0   levocetirizine (XYZAL) 5 MG tablet Take 1 tablet (5 mg total) by mouth every evening. 90 tablet 0   Omega-3 Fatty Acids (FISH OIL) 1000 MG CAPS Take 3 capsules (3,000 mg total) by mouth daily. 270 capsule 3   Tiotropium Bromide-Olodaterol 2.5-2.5 MCG/ACT AERS Inhale 2 puffs into the lungs daily. 4 g 5   topiramate (TOPAMAX) 100 MG tablet TAKE (1) TABLET BY MOUTH ONCE DAILY. 90 tablet 3   promethazine-dextromethorphan (PROMETHAZINE-DM) 6.25-15 MG/5ML syrup Take 5 mLs by mouth 4 (four) times daily as needed for cough. (Patient not taking: Reported on 04/12/2022) 200 mL 0   No current facility-administered medications on file prior to visit.    Allergies  Allergen Reactions   Bee Venom Shortness Of Breath   Latex Itching   Lisinopril Cough    coughing    Social History   Substance and Sexual Activity  Alcohol Use Yes   Comment: occ    Social History   Tobacco Use  Smoking Status Every Day   Packs/day: 1.00   Years: 26.00   Pack years: 26.00   Types: Cigarettes  Smokeless Tobacco Never  Tobacco Comments   1/4 ppd 05/30/21    Review of Systems  Constitutional:  Positive for malaise/fatigue.  HENT:  Positive for sinus pain.   Eyes:  Positive for blurred vision.  Respiratory:  Positive for cough.   Cardiovascular: Negative.   Gastrointestinal:  Positive for heartburn.  Genitourinary: Negative.   Musculoskeletal:  Positive for joint pain.  Skin: Negative.   Neurological: Negative.   Endo/Heme/Allergies: Negative.   Psychiatric/Behavioral: Negative.     Objective   Vitals:   04/25/22 0946  BP: (!) 126/57  Pulse: 78  Resp: 14  Temp: 98.3 F (36.8 C)  SpO2: 94%    Physical Exam Vitals reviewed.  Constitutional:      Appearance: Normal appearance. She is obese. She is not ill-appearing.  HENT:     Head: Normocephalic and atraumatic.  Cardiovascular:     Rate and Rhythm: Normal rate and regular  rhythm.     Heart sounds: Normal heart sounds. No murmur heard.   No friction rub. No gallop.  Pulmonary:     Effort: Pulmonary effort is normal. No respiratory distress.     Breath sounds: Normal breath sounds. No stridor. No wheezing, rhonchi or rales.  Abdominal:     General: Bowel sounds are normal. There is no distension.     Palpations: Abdomen is soft. There is no mass.     Tenderness: There is  no abdominal tenderness. There is no guarding or rebound.     Hernia: No hernia is present.  Genitourinary:    Comments: Prominent external hemorrhoid at the 11 o'clock position as well as external hemorrhoidal skin tags at the 6 o'clock position.  A mild internal hemorrhoid is noted at the 11 o'clock position also.  No active bleeding noted.  Sphincter tone is normal. Skin:    General: Skin is warm and dry.  Neurological:     Mental Status: She is alert and oriented to person, place, and time.   Previous office visit reviewed Assessment  Hemorrhoidal disease Plan  Patient is scheduled for a hemorrhoidectomy which may involve 2 columns of 05/10/2022.  The risks and benefits of the procedure including bleeding, infection, pain, incontinence, and the possibility of recurrent hemorrhoidal disease were fully explained to the patient, who gave informed consent. 

## 2022-05-07 NOTE — Patient Instructions (Signed)
Destiny Petty  05/07/2022     @PREFPERIOPPHARMACY @   Your procedure is scheduled on  05/10/2022.   [facility]   Call this number if you have problems the morning of surgery:  [phone]   Remember:  Do not eat or drink after midnight.     Use your inhaler before you come and bring your rescue inhaler with you.      Take these medicines the morning of surgery with A SIP OF WATER                   amlodipine, wellbutrin, maxalt(if needed).     Do not wear jewelry, make-up or nail polish.  Do not wear lotions, powders, or perfumes, or deodorant.  Do not shave 48 hours prior to surgery.  Men may shave face and neck.  Do not bring valuables to the hospital.  [facility] is not responsible for any belongings or valuables.  Contacts, dentures or bridgework may not be worn into surgery.  Leave your suitcase in the car.  After surgery it may be brought to your room.  For patients admitted to the hospital, discharge time will be determined by your treatment team.  Patients discharged the day of surgery will not be allowed to drive home and must  have someone with them for 24 hours.    Special instructions:   DO NOT smoke tobacco or vape for 24 hours before your procedure.  Please read over the following fact sheets that you were given. Coughing and Deep Breathing, Surgical Site Infection Prevention, Anesthesia Post-op Instructions, and Care and Recovery After Surgery      General Anesthesia, Adult, Care After This sheet gives you information about how to care for yourself after your procedure. Your health care provider may also give you more specific instructions. If you have problems or questions, contact your health care provider. What can I expect after the procedure? After the procedure, the following side effects are common: Pain or discomfort at the IV site. Nausea. Vomiting. Sore throat. Trouble concentrating. Feeling  cold or chills. Feeling weak or tired. Sleepiness and fatigue. Soreness and body aches. These side effects can affect parts of the body that were not involved in surgery. Follow these instructions at home: For the time period you were told by your health care provider:  Rest. Do not participate in activities where you could fall or become injured. Do not drive or use machinery. Do not drink alcohol. Do not take sleeping pills or medicines that cause drowsiness. Do not make important decisions or sign legal documents. Do not take care of children on your own. Eating and drinking Follow any instructions from your health care provider about eating or drinking restrictions. When you feel hungry, start by eating small amounts of foods that are soft and easy to digest (bland), such as toast. Gradually return to your regular diet. Drink enough fluid to keep your urine pale yellow. If you vomit, rehydrate by drinking water, juice, or clear broth. General instructions If you have sleep apnea, surgery and certain medicines can increase your risk for breathing problems. Follow instructions from your health care provider about wearing your sleep device: Anytime you are sleeping, including during daytime naps. While taking prescription pain medicines, sleeping medicines, or medicines that make you drowsy. Have a responsible adult stay with you for the time you are told. It is important to have someone  help care for you until you are awake and alert. Return to your normal activities as told by your health care provider. Ask your health care provider what activities are safe for you. Take over-the-counter and prescription medicines only as told by your health care provider. If you smoke, do not smoke without supervision. Keep all follow-up visits as told by your health care provider. This is important. Contact a health care provider if: You have nausea or vomiting that does not get better with  medicine. You cannot eat or drink without vomiting. You have pain that does not get better with medicine. You are unable to pass urine. You develop a skin rash. You have a fever. You have redness around your IV site that gets worse. Get help right away if: You have difficulty breathing. You have chest pain. You have blood in your urine or stool, or you vomit blood. Summary After the procedure, it is common to have a sore throat or nausea. It is also common to feel tired. Have a responsible adult stay with you for the time you are told. It is important to have someone help care for you until you are awake and alert. When you feel hungry, start by eating small amounts of foods that are soft and easy to digest (bland), such as toast. Gradually return to your regular diet. Drink enough fluid to keep your urine pale yellow. Return to your normal activities as told by your health care provider. Ask your health care provider what activities are safe for you. This information is not intended to replace advice given to you by your health care provider. Make sure you discuss any questions you have with your health care provider. Document Revised: 08/10/2020 Document Reviewed: 03/09/2020 Elsevier Patient Education  2023 [employer] How to Use Chlorhexidine for Bathing Chlorhexidine gluconate (CHG) is a germ-killing (antiseptic) solution that is used to clean the skin. It can get rid of the bacteria that normally live on the skin and can keep them away for about 24 hours. To clean your skin with CHG, you may be given: A CHG solution to use in the shower or as part of a sponge bath. A prepackaged cloth that contains CHG. Cleaning your skin with CHG may help lower the risk for infection: While you are staying in the intensive care unit of the hospital. If you have a vascular access, such as a central line, to provide short-term or long-term access to your veins. If you have a catheter to drain urine  from your bladder. If you are on a ventilator. A ventilator is a machine that helps you breathe by moving air in and out of your lungs. After surgery. What are the risks? Risks of using CHG include: A skin reaction. Hearing loss, if CHG gets in your ears and you have a perforated eardrum. Eye injury, if CHG gets in your eyes and is not rinsed out. The CHG product catching fire. Make sure that you avoid smoking and flames after applying CHG to your skin. Do not use CHG: If you have a chlorhexidine allergy or have previously reacted to chlorhexidine. On babies younger than 48 months of age. How to use CHG solution Use CHG only as told by your health care provider, and follow the instructions on the label. Use the full amount of CHG as directed. Usually, this is one bottle. During a shower Follow these steps when using CHG solution during a shower (unless your health care provider gives you different  instructions): Start the shower. Use your normal soap and shampoo to wash your face and hair. Turn off the shower or move out of the shower stream. Pour the CHG onto a clean washcloth. Do not use any type of brush or rough-edged sponge. Starting at your neck, lather your body down to your toes. Make sure you follow these instructions: If you will be having surgery, pay special attention to the part of your body where you will be having surgery. Scrub this area for at least 1 minute. Do not use CHG on your head or face. If the solution gets into your ears or eyes, rinse them well with water. Avoid your genital area. Avoid any areas of skin that have broken skin, cuts, or scrapes. Scrub your back and under your arms. Make sure to wash skin folds. Let the lather sit on your skin for 1-2 minutes or as long as told by your health care provider. Thoroughly rinse your entire body in the shower. Make sure that all body creases and crevices are rinsed well. Dry off with a clean towel. Do not put any  substances on your body afterward--such as powder, lotion, or perfume--unless you are told to do so by your health care provider. Only use lotions that are recommended by the manufacturer. Put on clean clothes or pajamas. If it is the night before your surgery, sleep in clean sheets.  During a sponge bath Follow these steps when using CHG solution during a sponge bath (unless your health care provider gives you different instructions): Use your normal soap and shampoo to wash your face and hair. Pour the CHG onto a clean washcloth. Starting at your neck, lather your body down to your toes. Make sure you follow these instructions: If you will be having surgery, pay special attention to the part of your body where you will be having surgery. Scrub this area for at least 1 minute. Do not use CHG on your head or face. If the solution gets into your ears or eyes, rinse them well with water. Avoid your genital area. Avoid any areas of skin that have broken skin, cuts, or scrapes. Scrub your back and under your arms. Make sure to wash skin folds. Let the lather sit on your skin for 1-2 minutes or as long as told by your health care provider. Using a different clean, wet washcloth, thoroughly rinse your entire body. Make sure that all body creases and crevices are rinsed well. Dry off with a clean towel. Do not put any substances on your body afterward--such as powder, lotion, or perfume--unless you are told to do so by your health care provider. Only use lotions that are recommended by the manufacturer. Put on clean clothes or pajamas. If it is the night before your surgery, sleep in clean sheets. How to use CHG prepackaged cloths Only use CHG cloths as told by your health care provider, and follow the instructions on the label. Use the CHG cloth on clean, dry skin. Do not use the CHG cloth on your head or face unless your health care provider tells you to. When washing with the CHG cloth: Avoid  your genital area. Avoid any areas of skin that have broken skin, cuts, or scrapes. Before surgery Follow these steps when using a CHG cloth to clean before surgery (unless your health care provider gives you different instructions): Using the CHG cloth, vigorously scrub the part of your body where you will be having surgery. Scrub using a back-and-forth  motion for 3 minutes. The area on your body should be completely wet with CHG when you are done scrubbing. Do not rinse. Discard the cloth and let the area air-dry. Do not put any substances on the area afterward, such as powder, lotion, or perfume. Put on clean clothes or pajamas. If it is the night before your surgery, sleep in clean sheets.  For general bathing Follow these steps when using CHG cloths for general bathing (unless your health care provider gives you different instructions). Use a separate CHG cloth for each area of your body. Make sure you wash between any folds of skin and between your fingers and toes. Wash your body in the following order, switching to a new cloth after each step: The front of your neck, shoulders, and chest. Both of your arms, under your arms, and your hands. Your stomach and groin area, avoiding the genitals. Your right leg and foot. Your left leg and foot. The back of your neck, your back, and your buttocks. Do not rinse. Discard the cloth and let the area air-dry. Do not put any substances on your body afterward--such as powder, lotion, or perfume--unless you are told to do so by your health care provider. Only use lotions that are recommended by the manufacturer. Put on clean clothes or pajamas. Contact a health care provider if: Your skin gets irritated after scrubbing. You have questions about using your solution or cloth. You swallow any chlorhexidine. Call your local poison control center (1-581-682-2380 in the [place] Get help right away if: Your eyes itch badly, or they become very red or  swollen. Your skin itches badly and is red or swollen. Your hearing changes. You have trouble seeing. You have swelling or tingling in your mouth or throat. You have trouble breathing. These symptoms may represent a serious problem that is an emergency. Do not wait to see if the symptoms will go away. Get medical help right away. Call your local emergency services (911 in the [place] Do not drive yourself to the hospital. Summary Chlorhexidine gluconate (CHG) is a germ-killing (antiseptic) solution that is used to clean the skin. Cleaning your skin with CHG may help to lower your risk for infection. You may be given CHG to use for bathing. It may be in a bottle or in a prepackaged cloth to use on your skin. Carefully follow your health care provider's instructions and the instructions on the product label. Do not use CHG if you have a chlorhexidine allergy. Contact your health care provider if your skin gets irritated after scrubbing. This information is not intended to replace advice given to you by your health care provider. Make sure you discuss any questions you have with your health care provider. Document Revised: 02/05/2021 Document Reviewed: 02/05/2021 Elsevier Patient Education  [place].

## 2022-05-08 ENCOUNTER — Other Ambulatory Visit: Payer: Self-pay | Admitting: Internal Medicine

## 2022-05-08 ENCOUNTER — Other Ambulatory Visit: Payer: Self-pay | Admitting: Family Medicine

## 2022-05-08 ENCOUNTER — Encounter (HOSPITAL_COMMUNITY)
Admission: RE | Admit: 2022-05-08 | Discharge: 2022-05-08 | Disposition: A | Discharge status: Home / Self Care | Payer: Medicaid Other | Source: Ambulatory Visit | Attending: General Surgery | Admitting: General Surgery

## 2022-05-08 DIAGNOSIS — I10 Essential (primary) hypertension: Secondary | ICD-10-CM

## 2022-05-08 DIAGNOSIS — Z01812 Encounter for preprocedural laboratory examination: Secondary | ICD-10-CM | POA: Insufficient documentation

## 2022-05-08 DIAGNOSIS — Z01818 Encounter for other preprocedural examination: Secondary | ICD-10-CM

## 2022-05-08 DIAGNOSIS — F17218 Nicotine dependence, cigarettes, with other nicotine-induced disorders: Secondary | ICD-10-CM

## 2022-05-08 DIAGNOSIS — Z79899 Other long term (current) drug therapy: Secondary | ICD-10-CM | POA: Diagnosis not present

## 2022-05-08 LAB — BASIC METABOLIC PANEL
Anion gap: 8 (ref 5–15)
BUN: 18 mg/dL (ref 6–20)
CO2: 21 mmol/L — ABNORMAL LOW (ref 22–32)
Calcium: 9.1 mg/dL (ref 8.9–10.3)
Chloride: 110 mmol/L (ref 98–111)
Creatinine, Ser: 0.86 mg/dL (ref 0.44–1.00)
GFR, Estimated: >60 mL/min (ref >60)
Glucose, Bld: 75 mg/dL (ref 70–99)
Potassium: 3.5 mmol/L (ref 3.5–5.1)
Sodium: 139 mmol/L (ref 135–145)

## 2022-05-08 LAB — POCT PREGNANCY, URINE: Preg Test, Ur: NEGATIVE

## 2022-05-10 ENCOUNTER — Ambulatory Visit (HOSPITAL_BASED_OUTPATIENT_CLINIC_OR_DEPARTMENT_OTHER): Payer: Medicaid Other | Admitting: Certified Registered"

## 2022-05-10 ENCOUNTER — Ambulatory Visit (HOSPITAL_COMMUNITY): Payer: Medicaid Other | Admitting: Certified Registered"

## 2022-05-10 ENCOUNTER — Ambulatory Visit (HOSPITAL_COMMUNITY)
Admission: RE | Admit: 2022-05-10 | Discharge: 2022-05-10 | Disposition: A | Discharge status: Home / Self Care | Payer: Medicaid Other | Attending: General Surgery | Admitting: General Surgery

## 2022-05-10 ENCOUNTER — Encounter (HOSPITAL_COMMUNITY): Payer: Self-pay | Admitting: General Surgery

## 2022-05-10 ENCOUNTER — Encounter (HOSPITAL_COMMUNITY)
Admission: RE | Disposition: A | Discharge status: Home / Self Care | Payer: Self-pay | Source: Home / Self Care | Attending: General Surgery

## 2022-05-10 ENCOUNTER — Other Ambulatory Visit: Payer: Self-pay

## 2022-05-10 DIAGNOSIS — K219 Gastro-esophageal reflux disease without esophagitis: Secondary | ICD-10-CM | POA: Insufficient documentation

## 2022-05-10 DIAGNOSIS — Z6841 Body Mass Index (BMI) 40.0 and over, adult: Secondary | ICD-10-CM | POA: Insufficient documentation

## 2022-05-10 DIAGNOSIS — K449 Diaphragmatic hernia without obstruction or gangrene: Secondary | ICD-10-CM | POA: Diagnosis not present

## 2022-05-10 DIAGNOSIS — F17210 Nicotine dependence, cigarettes, uncomplicated: Secondary | ICD-10-CM | POA: Insufficient documentation

## 2022-05-10 DIAGNOSIS — K648 Other hemorrhoids: Secondary | ICD-10-CM | POA: Insufficient documentation

## 2022-05-10 DIAGNOSIS — K645 Perianal venous thrombosis: Secondary | ICD-10-CM | POA: Diagnosis not present

## 2022-05-10 DIAGNOSIS — F319 Bipolar disorder, unspecified: Secondary | ICD-10-CM | POA: Insufficient documentation

## 2022-05-10 DIAGNOSIS — I10 Essential (primary) hypertension: Secondary | ICD-10-CM | POA: Diagnosis not present

## 2022-05-10 DIAGNOSIS — J45909 Unspecified asthma, uncomplicated: Secondary | ICD-10-CM | POA: Insufficient documentation

## 2022-05-10 DIAGNOSIS — K644 Residual hemorrhoidal skin tags: Secondary | ICD-10-CM | POA: Diagnosis not present

## 2022-05-10 DIAGNOSIS — K642 Third degree hemorrhoids: Secondary | ICD-10-CM | POA: Diagnosis not present

## 2022-05-10 DIAGNOSIS — Z79899 Other long term (current) drug therapy: Secondary | ICD-10-CM | POA: Diagnosis not present

## 2022-05-10 DIAGNOSIS — F418 Other specified anxiety disorders: Secondary | ICD-10-CM

## 2022-05-10 HISTORY — PX: HEMORRHOID SURGERY: SHX153

## 2022-05-10 SURGERY — HEMORRHOIDECTOMY
Anesthesia: General | Site: Rectum

## 2022-05-10 MED ORDER — SODIUM CHLORIDE 0.9 % IV SOLN
2.0000 g | INTRAVENOUS | Status: AC
  Administered 2022-05-10: 2 g via INTRAVENOUS

## 2022-05-10 MED ORDER — PROPOFOL 10 MG/ML IV BOLUS
INTRAVENOUS | Status: DC | PRN
  Administered 2022-05-10: 200 mg via INTRAVENOUS

## 2022-05-10 MED ORDER — SODIUM CHLORIDE 0.9 % IV SOLN
INTRAVENOUS | Status: AC
  Filled 2022-05-10: qty 2

## 2022-05-10 MED ORDER — ONDANSETRON HCL 4 MG/2ML IJ SOLN
4.0000 mg | Freq: Once | INTRAMUSCULAR | Status: DC | PRN
Start: 2022-05-10 — End: 2022-05-10

## 2022-05-10 MED ORDER — FENTANYL CITRATE PF 50 MCG/ML IJ SOSY: 25.0000 ug | PREFILLED_SYRINGE | INTRAMUSCULAR | Status: DC | PRN

## 2022-05-10 MED ORDER — LIDOCAINE VISCOUS HCL 2 % MT SOLN
OROMUCOSAL | Status: AC
  Filled 2022-05-10: qty 15

## 2022-05-10 MED ORDER — FENTANYL CITRATE (PF) 100 MCG/2ML IJ SOLN
INTRAMUSCULAR | Status: DC | PRN
  Administered 2022-05-10 (×2): 50 ug via INTRAVENOUS

## 2022-05-10 MED ORDER — SURGILUBE EX GEL
CUTANEOUS | Status: DC | PRN
  Administered 2022-05-10: 1 via TOPICAL

## 2022-05-10 MED ORDER — LIDOCAINE HCL (PF) 2 % IJ SOLN
INTRAMUSCULAR | Status: AC
  Filled 2022-05-10: qty 5

## 2022-05-10 MED ORDER — KETAMINE HCL 10 MG/ML IJ SOLN
INTRAMUSCULAR | Status: AC
  Filled 2022-05-10: qty 1

## 2022-05-10 MED ORDER — SODIUM CHLORIDE 0.9 % IR SOLN
Status: DC | PRN
  Administered 2022-05-10: 1000 mL

## 2022-05-10 MED ORDER — ROCURONIUM BROMIDE 100 MG/10ML IV SOLN
INTRAVENOUS | Status: DC | PRN
  Administered 2022-05-10: 10 mg via INTRAVENOUS
  Administered 2022-05-10: 20 mg via INTRAVENOUS

## 2022-05-10 MED ORDER — KETOROLAC TROMETHAMINE 30 MG/ML IJ SOLN
INTRAMUSCULAR | Status: AC
  Filled 2022-05-10: qty 1

## 2022-05-10 MED ORDER — DEXAMETHASONE SODIUM PHOSPHATE 10 MG/ML IJ SOLN
INTRAMUSCULAR | Status: DC | PRN
  Administered 2022-05-10: 10 mg via INTRAVENOUS

## 2022-05-10 MED ORDER — MIDAZOLAM HCL 5 MG/5ML IJ SOLN
INTRAMUSCULAR | Status: DC | PRN
  Administered 2022-05-10: 2 mg via INTRAVENOUS

## 2022-05-10 MED ORDER — OXYCODONE-ACETAMINOPHEN 5-325 MG PO TABS: 1.0000 | ORAL_TABLET | ORAL | 0 refills | Status: DC | PRN

## 2022-05-10 MED ORDER — LACTATED RINGERS IV SOLN: INTRAVENOUS | Status: DC | PRN

## 2022-05-10 MED ORDER — KETAMINE HCL 10 MG/ML IJ SOLN
INTRAMUSCULAR | Status: DC | PRN
  Administered 2022-05-10 (×2): 10 mg via INTRAVENOUS
  Administered 2022-05-10: 5 mg via INTRAVENOUS

## 2022-05-10 MED ORDER — ALBUTEROL SULFATE HFA 108 (90 BASE) MCG/ACT IN AERS
INHALATION_SPRAY | RESPIRATORY_TRACT | Status: DC | PRN
  Administered 2022-05-10 (×2): 2 via RESPIRATORY_TRACT

## 2022-05-10 MED ORDER — CHLORHEXIDINE GLUCONATE CLOTH 2 % EX PADS: 6.0000 | MEDICATED_PAD | Freq: Once | CUTANEOUS | Status: DC

## 2022-05-10 MED ORDER — BUPIVACAINE LIPOSOME 1.3 % IJ SUSP
INTRAMUSCULAR | Status: DC | PRN
  Administered 2022-05-10: 20 mL

## 2022-05-10 MED ORDER — ONDANSETRON HCL 4 MG/2ML IJ SOLN
INTRAMUSCULAR | Status: DC | PRN
  Administered 2022-05-10: 4 mg via INTRAVENOUS

## 2022-05-10 MED ORDER — SUCCINYLCHOLINE CHLORIDE 200 MG/10ML IV SOSY
PREFILLED_SYRINGE | INTRAVENOUS | Status: DC | PRN
  Administered 2022-05-10: 120 mg via INTRAVENOUS

## 2022-05-10 MED ORDER — LIDOCAINE 2% (20 MG/ML) 5 ML SYRINGE
INTRAMUSCULAR | Status: DC | PRN
  Administered 2022-05-10: 100 mg via INTRAVENOUS

## 2022-05-10 MED ORDER — SUGAMMADEX SODIUM 200 MG/2ML IV SOLN
INTRAVENOUS | Status: DC | PRN
  Administered 2022-05-10: 200 mg via INTRAVENOUS

## 2022-05-10 MED ORDER — MIDAZOLAM HCL 2 MG/2ML IJ SOLN
INTRAMUSCULAR | Status: AC
  Filled 2022-05-10: qty 2

## 2022-05-10 MED ORDER — DIPHENHYDRAMINE HCL 50 MG/ML IJ SOLN
INTRAMUSCULAR | Status: AC
  Filled 2022-05-10: qty 1

## 2022-05-10 MED ORDER — LIDOCAINE VISCOUS HCL 2 % MT SOLN
OROMUCOSAL | Status: DC | PRN
  Administered 2022-05-10: 1 via OROMUCOSAL

## 2022-05-10 MED ORDER — FENTANYL CITRATE (PF) 250 MCG/5ML IJ SOLN
INTRAMUSCULAR | Status: AC
  Filled 2022-05-10: qty 5

## 2022-05-10 MED ORDER — DEXAMETHASONE SODIUM PHOSPHATE 10 MG/ML IJ SOLN
INTRAMUSCULAR | Status: AC
  Filled 2022-05-10: qty 1

## 2022-05-10 MED ORDER — PROPOFOL 10 MG/ML IV BOLUS
INTRAVENOUS | Status: AC
  Filled 2022-05-10: qty 20

## 2022-05-10 MED ORDER — KETOROLAC TROMETHAMINE 30 MG/ML IJ SOLN
INTRAMUSCULAR | Status: DC | PRN
  Administered 2022-05-10: 30 mg via INTRAVENOUS

## 2022-05-10 MED ORDER — ALBUTEROL SULFATE HFA 108 (90 BASE) MCG/ACT IN AERS
INHALATION_SPRAY | RESPIRATORY_TRACT | Status: AC
  Filled 2022-05-10: qty 6.7

## 2022-05-10 SURGICAL SUPPLY — 28 items
CLOTH BEACON ORANGE TIMEOUT ST (SAFETY) ×2 IMPLANT
COVER LIGHT HANDLE STERIS (MISCELLANEOUS) ×4 IMPLANT
DISSECTOR SURG LIGASURE 21 (MISCELLANEOUS) ×2 IMPLANT
DRAPE HALF SHEET 40X57 (DRAPES) ×2 IMPLANT
ELECT REM PT RETURN 9FT ADLT (ELECTROSURGICAL) ×2
ELECTRODE REM PT RTRN 9FT ADLT (ELECTROSURGICAL) ×1 IMPLANT
GAUZE 4X4 16PLY ~~LOC~~+RFID DBL (SPONGE) ×2 IMPLANT
GAUZE SPONGE 4X4 12PLY STRL (GAUZE/BANDAGES/DRESSINGS) ×4 IMPLANT
GLOVE BIOGEL PI IND STRL 7.0 (GLOVE) ×2 IMPLANT
GLOVE BIOGEL PI INDICATOR 7.0 (GLOVE) ×2
GLOVE SURG SS PI 7.5 STRL IVOR (GLOVE) ×2 IMPLANT
GOWN STRL REUS W/TWL LRG LVL3 (GOWN DISPOSABLE) ×4 IMPLANT
HEMOSTAT SURGICEL 4X8 (HEMOSTASIS) ×2 IMPLANT
KIT TURNOVER CYSTO (KITS) ×2 IMPLANT
MANIFOLD NEPTUNE II (INSTRUMENTS) ×2 IMPLANT
NDL HYPO 21X1.5 SAFETY (NEEDLE) ×1 IMPLANT
NEEDLE HYPO 21X1.5 SAFETY (NEEDLE) ×2 IMPLANT
NS IRRIG 1000ML POUR BTL (IV SOLUTION) ×2 IMPLANT
PACK PERI GYN (CUSTOM PROCEDURE TRAY) ×2 IMPLANT
PAD ARMBOARD 7.5X6 YLW CONV (MISCELLANEOUS) ×2 IMPLANT
PENCIL SMOKE EVACUATOR (MISCELLANEOUS) ×2 IMPLANT
SET BASIN LINEN APH (SET/KITS/TRAYS/PACK) ×2 IMPLANT
SHEARS HARMONIC 9CM CVD (BLADE) ×2 IMPLANT
SURGILUBE 2OZ TUBE FLIPTOP (MISCELLANEOUS) ×2 IMPLANT
SUT CHROMIC 3 0 PS 2 (SUTURE) ×1 IMPLANT
SUT SILK 0 FSL (SUTURE) ×2 IMPLANT
SUT VIC AB 2-0 CT2 27 (SUTURE) IMPLANT
SYR 20ML LL LF (SYRINGE) ×3 IMPLANT

## 2022-05-10 NOTE — Interval H&P Note (Signed)
History and Physical Interval Note:  05/10/2022 8:49 AM  Destiny Petty  has presented today for surgery, with the diagnosis of HEMORRHOIDS.  The various methods of treatment have been discussed with the patient and family. After consideration of risks, benefits and other options for treatment, the patient has consented to  Procedure(s): HEMORRHOIDECTOMY, EXTENSIVE (N/A) as a surgical intervention.  The patient's history has been reviewed, patient examined, no change in status, stable for surgery.  I have reviewed the patient's chart and labs.  Questions were answered to the patient's satisfaction.     Franky Macho

## 2022-05-10 NOTE — Discharge Instructions (Signed)
Start Sitz bath in three days.  Take a stool softener (Colace) daily

## 2022-05-10 NOTE — Anesthesia Postprocedure Evaluation (Signed)
Anesthesia Post Note  Patient: Destiny Petty  Procedure(s) Performed: HEMORRHOIDECTOMY, EXTENSIVE (Rectum)  Patient location during evaluation: Phase II Anesthesia Type: General Level of consciousness: awake Pain management: pain level controlled Vital Signs Assessment: post-procedure vital signs reviewed and stable Respiratory status: spontaneous breathing and respiratory function stable Cardiovascular status: blood pressure returned to baseline and stable Postop Assessment: no headache and no apparent nausea or vomiting Anesthetic complications: no Comments: Late entry   No notable events documented.   Last Vitals:  Vitals:   05/10/22 1030 05/10/22 1058  BP: (!) 141/87 (!) 141/87  Pulse: 84 83  Resp: 11 18  Temp:  36.6 C  SpO2: 100% 100%    Last Pain:  Vitals:   05/10/22 1058  TempSrc: Oral  PainSc: [place]

## 2022-05-10 NOTE — Transfer of Care (Signed)
Immediate Anesthesia Transfer of Care Note  Patient: Destiny Petty  Procedure(s) Performed: HEMORRHOIDECTOMY, EXTENSIVE (Rectum)  Patient Location: PACU  Anesthesia Type:General  Level of Consciousness: drowsy and patient cooperative  Airway & Oxygen Therapy: Patient Spontanous Breathing and non-rebreather face mask  Post-op Assessment: Report given to RN and Post -op Vital signs reviewed and stable  Post vital signs: Reviewed and stable  Last Vitals:  Vitals Value Taken Time  BP 116/65 05/10/22 1006  Temp    Pulse 90 05/10/22 1012  Resp 35 05/10/22 1012  SpO2 100 % 05/10/22 1012  Vitals shown include unvalidated device data.  Last Pain:  Vitals:   05/10/22 1006  TempSrc:   PainSc: Asleep      Patients Stated Pain Goal: 6 (05/10/22 0756)  Complications: No notable events documented.

## 2022-05-10 NOTE — Anesthesia Preprocedure Evaluation (Signed)
Anesthesia Evaluation  Patient identified by MRN, date of birth, ID band Patient awake    Reviewed: Allergy & Precautions, H&P , NPO status , Patient's Chart, lab work & pertinent test results, reviewed documented beta blocker date and time   Airway Mallampati: II  TM Distance: >3 FB Neck ROM: full    Dental no notable dental hx.    Pulmonary asthma , Current Smoker and Patient abstained from smoking.,    Pulmonary exam normal breath sounds clear to auscultation       Cardiovascular Exercise Tolerance: Good hypertension, + Orthopnea and + DOE   Rhythm:regular Rate:Normal     Neuro/Psych  Headaches, PSYCHIATRIC DISORDERS Anxiety Depression Bipolar Disorder  Neuromuscular disease    GI/Hepatic Neg liver ROS, hiatal hernia, GERD  Medicated,  Endo/Other  Morbid obesity  Renal/GU negative Renal ROS  negative genitourinary   Musculoskeletal   Abdominal (+) + obese,   Peds  Hematology negative hematology ROS (+)   Anesthesia Other Findings   Reproductive/Obstetrics negative OB ROS                             Anesthesia Physical Anesthesia Plan  ASA: 3  Anesthesia Plan: General and General LMA   Post-op Pain Management:    Induction:   PONV Risk Score and Plan:   Airway Management Planned:   Additional Equipment:   Intra-op Plan:   Post-operative Plan:   Informed Consent: I have reviewed the patients History and Physical, chart, labs and discussed the procedure including the risks, benefits and alternatives for the proposed anesthesia with the patient or authorized representative who has indicated his/her understanding and acceptance.     Dental Advisory Given  Plan Discussed with: CRNA  Anesthesia Plan Comments:         Anesthesia Quick Evaluation

## 2022-05-10 NOTE — Anesthesia Procedure Notes (Signed)
Procedure Name: Intubation Date/Time: 05/10/2022 9:11 AM Performed by: Gwyndolyn Saxon, CRNA Pre-anesthesia Checklist: Patient identified, Emergency Drugs available, Suction available and Patient being monitored Patient Re-evaluated:Patient Re-evaluated prior to induction Oxygen Delivery Method: Circle system utilized Preoxygenation: Pre-oxygenation with 100% oxygen Induction Type: IV induction, Rapid sequence and Cricoid Pressure applied Laryngoscope Size: Miller and 2 Grade View: Grade I Tube type: Oral Tube size: 7.0 mm Number of attempts: 1 Airway Equipment and Method: Patient positioned with wedge pillow and Stylet Placement Confirmation: ETT inserted through vocal cords under direct vision, positive ETCO2 and breath sounds checked- equal and bilateral Secured at: 21 cm Tube secured with: Tape Dental Injury: Teeth and Oropharynx as per pre-operative assessment

## 2022-05-10 NOTE — Op Note (Signed)
Patient:  Destiny Petty  DOB:  07/06/1981  MRN:  [id number]   Preop Diagnosis: Prolapsing hemorrhoidal disease  Postop Diagnosis: Same  Procedure: Extensive hemorrhoidectomy  Surgeon: Franky Macho, MD  Anes: General endotracheal  Indications: Patient is a 41 year old female who has had multiple episodes of bleeding and thrombosed hemorrhoids.  She now presents for an extensive hemorrhoidectomy.  The risks and benefits of the procedure including bleeding, infection, and the possibility of needing further surgery were fully explained to the patient, who gave informed consent.  Procedure note: The patient was placed in the lithotomy position after induction of general endotracheal anesthesia.  The perineum was prepped and draped using usual sterile technique with Betadine.  Surgical site confirmation was performed.  On anoscopy, the patient had prolapsing internal and external hemorrhoids at the 11 o'clock position with an external hemorrhoidal skin tag at the 1 o'clock position as well as significant hemorrhoidal disease at the 7 o'clock position.  Less prominent hemorrhoids were noted around the 3 to 4 o'clock position, but these were not addressed.  Using the LigaSure, both the 11:00 and 7:00 hemorrhoids were excised in a column like fashion.  The external hemorrhoidal skin tag was also excised using the LigaSure.  At the 11 o'clock position, 3-0 Chromic Gut sutures were placed to reapproximate the mucosa.  Care was taken to avoid the internal sphincter mechanism.  Exparel was instilled into the surrounding wound.  No abnormal bleeding was noted at the end of the procedure.  Surgicel and viscous Xylocaine rectal packing was then placed.  All tape and needle counts were correct at the end of the procedure.  The patient was extubated in the operating room and transferred to PACU in stable condition.  Complications: None  EBL: Minimal  Specimen: Hemorrhoids

## 2022-05-13 LAB — SURGICAL PATHOLOGY

## 2022-05-14 ENCOUNTER — Telehealth: Payer: Self-pay | Admitting: *Deleted

## 2022-05-14 ENCOUNTER — Other Ambulatory Visit (INDEPENDENT_AMBULATORY_CARE_PROVIDER_SITE_OTHER): Payer: Medicaid Other | Admitting: General Surgery

## 2022-05-14 DIAGNOSIS — Z09 Encounter for follow-up examination after completed treatment for conditions other than malignant neoplasm: Secondary | ICD-10-CM

## 2022-05-14 MED ORDER — OXYCODONE-ACETAMINOPHEN 10-325 MG PO TABS: 1.0000 | ORAL_TABLET | Freq: Four times a day (QID) | ORAL | 0 refills | Status: DC | PRN

## 2022-05-14 NOTE — Progress Notes (Signed)
Change in therapy due to surgical pain.

## 2022-05-14 NOTE — Telephone Encounter (Signed)
Received call from patient [phone]~ telephone.  Surgical Date: 05/10/2022 Procedure: Extensive Hemorrhoidectomy  Allergies: Bees, Latex, Lisinopril Pharmacy: [facility]   Patient reports that she is in severe rectal pain. Reports that she is using Oxycodone/ APAP 5/325mg  Q 4hrs PRN and augmenting with IBU 600mg .   States that she is using sitz baths and trying to use Ice. States that she cannot tolerate ice due to pain.   Also reports that she is having soft BM's with slight blood tinged drainage after BM.   Patient requesting something stronger for pain management.   Please advise.

## 2022-05-14 NOTE — Telephone Encounter (Signed)
Discussed with Dr. Lovell Sheehan.   Provider agreeable to medication change due to increased severity of pain.   Prescription sent to pharmacy by Dr. Lovell Sheehan.   Patient returned call and made aware.

## 2022-05-15 ENCOUNTER — Encounter (HOSPITAL_COMMUNITY): Payer: Self-pay | Admitting: General Surgery

## 2022-05-16 ENCOUNTER — Ambulatory Visit (INDEPENDENT_AMBULATORY_CARE_PROVIDER_SITE_OTHER): Payer: Medicaid Other | Admitting: General Surgery

## 2022-05-16 ENCOUNTER — Encounter: Payer: Self-pay | Admitting: *Deleted

## 2022-05-16 ENCOUNTER — Encounter: Payer: Self-pay | Admitting: General Surgery

## 2022-05-16 VITALS — BP 148/100 | HR 80 | Temp 98.2°F | Resp 18 | Ht 60.0 in | Wt 237.0 lb

## 2022-05-16 DIAGNOSIS — Z09 Encounter for follow-up examination after completed treatment for conditions other than malignant neoplasm: Secondary | ICD-10-CM

## 2022-05-16 NOTE — Progress Notes (Signed)
Subjective:     Destiny Petty  Patient here for postoperative visit, status post extensive hemorrhoidectomy.  Patient is having a significant amount of rectal pain.  She has started her sitz bath's.  I did reorder her pain medication.  Minimal blood is noted on the toilet paper when she wipes herself.  She is using medicated wipes.  She is not constipated. Objective:    BP (!) 148/100   Pulse 80   Temp 98.2 F (36.8 C) (Other (Comment))   Resp 18   Ht 5' (1.524 m)   Wt 237 lb (107.5 kg)   SpO2 94%   BMI 46.29 kg/m   General:  alert, cooperative, and mild distress  Rectum healing well.  Minimal swelling present.  No active bleeding noted. Final pathology consistent with diagnosis.     Assessment:    Doing well postoperatively. Having normal postoperative symptoms.    Plan:   I did give her Rectiv cream for pain control.  I told her that the pain should subside over the next week.  I will see her in 2 weeks for follow-up.  She may stay out of work until I see her on 05/30/2022.

## 2022-05-23 ENCOUNTER — Other Ambulatory Visit: Payer: Self-pay | Admitting: Internal Medicine

## 2022-05-23 DIAGNOSIS — J4530 Mild persistent asthma, uncomplicated: Secondary | ICD-10-CM

## 2022-05-23 DIAGNOSIS — J3089 Other allergic rhinitis: Secondary | ICD-10-CM

## 2022-05-30 ENCOUNTER — Ambulatory Visit (INDEPENDENT_AMBULATORY_CARE_PROVIDER_SITE_OTHER): Payer: Medicaid Other | Admitting: General Surgery

## 2022-05-30 ENCOUNTER — Encounter: Payer: Self-pay | Admitting: General Surgery

## 2022-05-30 VITALS — BP 155/100 | HR 83 | Temp 98.0°F | Resp 16 | Ht 60.0 in | Wt 230.0 lb

## 2022-05-30 DIAGNOSIS — Z09 Encounter for follow-up examination after completed treatment for conditions other than malignant neoplasm: Secondary | ICD-10-CM

## 2022-05-30 NOTE — Progress Notes (Signed)
Subjective:     Destiny Petty  Patient here for wound check, status post extensive hemorrhoidectomy.  She is doing much better.  She is pleased with the results.  She has no complaints. Objective:    BP (!) 155/100   Pulse 83   Temp 98 F (36.7 C) (Oral)   Resp 16   Ht 5' (1.524 m)   Wt 230 lb (104.3 kg)   SpO2 97%   BMI 44.92 kg/m   General:  alert, cooperative, and no distress  Rectal examination reveals a healed surgical site.  No swelling or bleeding noted.     Assessment:    Doing well postoperatively.    Plan:   May resume normal activity.  Follow-up here as needed.

## 2022-06-21 ENCOUNTER — Ambulatory Visit: Payer: Medicaid Other | Admitting: Internal Medicine
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
## 2022-09-23 ENCOUNTER — Encounter: Payer: Self-pay | Admitting: Internal Medicine

## 2022-09-23 ENCOUNTER — Ambulatory Visit: Payer: Medicaid Other | Admitting: Internal Medicine

## 2022-09-23 VITALS — BP 150/96 | HR 90 | Resp 18 | Ht 60.0 in | Wt 231.8 lb

## 2022-09-23 DIAGNOSIS — I10 Essential (primary) hypertension: Secondary | ICD-10-CM

## 2022-09-23 DIAGNOSIS — Z720 Tobacco use: Secondary | ICD-10-CM

## 2022-09-23 DIAGNOSIS — J3089 Other allergic rhinitis: Secondary | ICD-10-CM | POA: Diagnosis not present

## 2022-09-23 DIAGNOSIS — Z23 Encounter for immunization: Secondary | ICD-10-CM

## 2022-09-23 DIAGNOSIS — J4530 Mild persistent asthma, uncomplicated: Secondary | ICD-10-CM | POA: Diagnosis not present

## 2022-09-23 DIAGNOSIS — E559 Vitamin D deficiency, unspecified: Secondary | ICD-10-CM

## 2022-09-23 DIAGNOSIS — E782 Mixed hyperlipidemia: Secondary | ICD-10-CM

## 2022-09-23 DIAGNOSIS — G43719 Chronic migraine without aura, intractable, without status migrainosus: Secondary | ICD-10-CM | POA: Diagnosis not present

## 2022-09-23 DIAGNOSIS — R739 Hyperglycemia, unspecified: Secondary | ICD-10-CM

## 2022-09-23 DIAGNOSIS — K219 Gastro-esophageal reflux disease without esophagitis: Secondary | ICD-10-CM

## 2022-09-23 MED ORDER — TIOTROPIUM BROMIDE-OLODATEROL 2.5-2.5 MCG/ACT IN AERS: 2.0000 | INHALATION_SPRAY | Freq: Every day | RESPIRATORY_TRACT | 5 refills | Status: DC

## 2022-09-23 MED ORDER — HYDROCHLOROTHIAZIDE 12.5 MG PO CAPS: 12.5000 mg | ORAL_CAPSULE | Freq: Every day | ORAL | 0 refills | Status: DC

## 2022-09-23 MED ORDER — RIZATRIPTAN BENZOATE 10 MG PO TBDP: ORAL_TABLET | ORAL | 2 refills | Status: DC

## 2022-09-23 MED ORDER — ALBUTEROL SULFATE HFA 108 (90 BASE) MCG/ACT IN AERS: 1.0000 | INHALATION_SPRAY | Freq: Four times a day (QID) | RESPIRATORY_TRACT | 2 refills | Status: DC | PRN

## 2022-09-23 MED ORDER — BUPROPION HCL ER (XL) 300 MG PO TB24: 300.0000 mg | ORAL_TABLET | Freq: Every day | ORAL | 1 refills | Status: DC

## 2022-09-23 MED ORDER — FAMOTIDINE 20 MG PO TABS: ORAL_TABLET | ORAL | 3 refills | Status: DC

## 2022-09-23 MED ORDER — AMLODIPINE BESYLATE 5 MG PO TABS: 5.0000 mg | ORAL_TABLET | Freq: Every day | ORAL | 0 refills | Status: DC

## 2022-09-23 NOTE — Assessment & Plan Note (Addendum)
Takes Xyzal 5 mg QD

## 2022-09-23 NOTE — Patient Instructions (Signed)
Please start medications as prescribed.  Please continue to follow low carb diet and perform moderate exercise/walking at least 150 mins/week.  Please get fasting blood tests done before the next visit.

## 2022-09-23 NOTE — Assessment & Plan Note (Addendum)
Smokes about <1 pack/day, states she does not smoke daily now  Asked about quitting: confirms that he/she currently smokes cigarettes Advise to quit smoking: Educated about QUITTING to reduce the risk of cancer, cardio and cerebrovascular disease. Assess willingness: Unwilling to quit at this time, but is working on cutting back. Assist with counseling and pharmacotherapy: Counseled for 5 minutes and literature provided.  On Wellbutrin. Arrange for follow up: follow up in 3 months and continue to offer help.

## 2022-09-23 NOTE — Assessment & Plan Note (Signed)
BP Readings from Last 1 Encounters:  09/23/22 (!) 150/96   Uncontrolled due to noncompliance Restart amlodipine 5 mg daily and HCTZ 12.5 mg QD Counseled for compliance with the medications Advised DASH diet and moderate exercise/walking, at least 150 mins/week

## 2022-09-23 NOTE — Assessment & Plan Note (Signed)
Last lipid profile reviewed DC Fenofibrate to reduce pill burden Check lipid profile

## 2022-09-23 NOTE — Assessment & Plan Note (Signed)
Well-controlled with Stiolto and albuterol as needed Needs to follow-up with pulmonology

## 2022-09-23 NOTE — Assessment & Plan Note (Signed)
Well-controlled with Pepcid 

## 2022-09-23 NOTE — Assessment & Plan Note (Signed)
Uncontrolled he has she has run out of her Maxalt, refilled If frequent episodes of migraine, will add topiramate - has tried it before

## 2022-09-23 NOTE — Progress Notes (Signed)
Established Patient Office Visit  Subjective:  Patient ID: Destiny Petty, female    DOB: May 21, 1981  Age: 41 y.o. MRN: [id number]  CC:  Chief Complaint  Patient presents with   Follow-up    4 month follow up migraines are frequent now and needs med refills     HPI Destiny Petty is a 41 y.o. female with past medical history of asthma, GERD, migraine, HLD, depression and morbid obesity who presents for f/u of her chronic medical conditions.  HTN: Her BP was elevated today.  She has run out of her amlodipine and HCTZ for the last 2 months.  She complains of generalized headache, which is chronic.  She also has history of migraine.  She denies any chest pain or palpitations.  Migraine: She complains of intermittent episodes of headache, which is sharp, nonradiating, and is associated with severe nausea.  She had Maxalt as needed, but has run out of it.  Asthma: She has run out of her Stiolto inhaler.  She complains of intermittent dyspnea, worse with exertion.  She also keeps albuterol inhaler for dyspnea or wheezing.  Denies any fever or chills.  She takes Xyzal for allergies.  MDD: She complains of chronic fatigue and weight gain.  She is on Wellbutrin, which was started to help her quit smoking.  She still smokes, but reports that she has not been smoking on a daily basis.  Denies any SI or HI currently.  HLD: She takes fenofibrate for hyper TG.  Her last lipid profile shows TG WNL range.     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 Scheduled for Cholecystectomy   GERD (gastroesophageal reflux disease)    Hemorrhoids 02/10/2015   Hiatal hernia    Hyperlipidemia 04/09/2011   Hypertension    no meds   Injury of digital nerve of right little finger 08/23/2020   Laceration of blood vessel of right little finger 08/23/2020   Laceration of finger of left hand without damage to nail    left small finger   Menorrhagia with regular cycle 04/10/2016   Migraine 01/15/2016   Overview:  Overview:  IMO Load 2016 R1.3   Muscle spasm of back 03/13/2016   Numbness and tingling in left hand 08/19/2016   Obesity    Ovarian cyst 12/18/2016   Perianal wart 02/10/2015   Stress 02/14/2016   Weight loss counseling, encounter for 03/13/2016    Past Surgical History:  Procedure Laterality Date   ABLATION     CESAREAN SECTION     CESAREAN SECTION N/A    Phreesia 05/23/2020   CHOLECYSTECTOMY     COLONOSCOPY     DILATATION AND CURETTAGE/HYSTEROSCOPY WITH MINERVA N/A 08/31/2019   Procedure: DILATATION AND CURETTAGE (no specimen) /HYSTEROSCOPY WITH MINERVA ENDOMETRIAL ABLATION;  Surgeon: Jonnie Kind, MD;  Location: [facility];  Service: Gynecology;  Laterality:  N/A;   ESOPHAGOGASTRODUODENOSCOPY ENDOSCOPY     HEMORRHOID SURGERY N/A 05/10/2022   Procedure: HEMORRHOIDECTOMY, EXTENSIVE;  Surgeon: Aviva Signs, MD;  Location: [facility];  Service: General;  Laterality: N/A;   TENDON EXPLORATION Left 08/10/2020   Procedure: LEFT SMALL FINER EXPLORATION, REPAIR OF TENDON, ARTERY, NERVE;  Surgeon: Leanora Cover, MD;  Location: [facility];  Service: Orthopedics;  Laterality: Left;   TUBAL LIGATION      Family History  Problem Relation Age of Onset   Diabetes Mother    Kidney failure Mother    Early death Mother 44       diabetes complications   Cancer Father 48       colon cancer   Depression Sister    Hypertension  Sister    Anxiety disorder Sister    Other Sister        back problems   ADD / ADHD Daughter    Other Daughter        behavioral issues   ODD Daughter    Asthma Son    Cancer Maternal Grandmother        breast   Seizures Maternal Grandmother    Lupus Other    Breast cancer Maternal Aunt     Social History   Socioeconomic History   Marital status: Legally Separated    Spouse name: Not on file   Number of children: 2   Years of education: 14   Highest education level: Not on file  Occupational History   Not on file  Tobacco Use   Smoking status: Every Day    Packs/day: 1.00    Years: 26.00    Total pack years: 26.00    Types: Cigarettes   Smokeless tobacco: Never   Tobacco comments:    1/4 ppd 05/30/21  Vaping Use   Vaping Use: Never used  Substance and Sexual Activity   Alcohol use: Yes    Comment: occ   Drug use: No   Sexual activity: Not Currently    Birth control/protection: Surgical    Comment: tubal  Other Topics Concern   Not on file  Social History Narrative   Legally separated right now   2 children: son 62 and daughter 69 lives with her      2 dogs: macho, mary jane   2 birds:    1 cats: boghgie       Enjoys: spending time with kids, outside things      Diet: eat all food groups    Caffeine: coffee and soda daily   Water: 4 cups daily      Wears seat belt   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   No weapons        Social Determinants of Health   Financial Resource Strain: Low Risk  (06/01/2020)   Overall Financial Resource Strain (CARDIA)    Difficulty of Paying Living Expenses: Not hard at all  Food Insecurity: No Food Insecurity (06/01/2020)   Hunger Vital Sign    Worried About Running Out of Food in the Last Year: Never true    Ran Out of Food in the Last Year: Never true  Transportation Needs: No Transportation Needs (06/01/2020)   PRAPARE - Hydrologist (Medical): No    Lack of  Transportation (Non-Medical): No  Physical Activity: Insufficiently Active (06/01/2020)   Exercise Vital Sign    Days of Exercise per Week: 2 days  Minutes of Exercise per Session: 30 min  Stress: No Stress Concern Present (06/01/2020)   [place]    Feeling of Stress : Not at all  Social Connections: Moderately Isolated (06/01/2020)   Social Connection and Isolation Panel [NHANES]    Frequency of Communication with Friends and Family: More than three times a week    Frequency of Social Gatherings with Friends and Family: More than three times a week    Attends Religious Services: More than 4 times per year    Active Member of [employer] or Organizations: No    Attends Archivist Meetings: Never    Marital Status: Separated  Intimate Partner Violence: Not At Risk (06/01/2020)   Humiliation, Afraid, Rape, and Kick questionnaire    Fear of Current or Ex-Partner: No    Emotionally Abused: No    Physically Abused: No    Sexually Abused: No    Outpatient Medications Prior to Visit  Medication Sig Dispense Refill   CRANBERRY PO Take 1 tablet by mouth daily as needed (clean out system).     fluticasone (FLONASE) 50 MCG/ACT nasal spray INSTILL 2 SPRAYS INTO BOTH NOSTRILS DAILY. 16 g 0   hydrocortisone-pramoxine (PROCTOFOAM-HC) rectal foam Place 1 applicator rectally 2 (two) times daily. 10 g 0   levocetirizine (XYZAL) 5 MG tablet Take 1 tablet (5 mg total) by mouth every evening. 90 tablet 0   Omega-3 Fatty Acids (FISH OIL) 1000 MG CAPS Take 3 capsules (3,000 mg total) by mouth daily. (Patient taking differently: Take 1,000 mg by mouth 2 (two) times daily.) 270 capsule 3   albuterol (VENTOLIN HFA) 108 (90 Base) MCG/ACT inhaler INHALE 1 OR 2 PUFFS INTO THE LUNGS EVERY 6 HOURS AS NEEDED FOR WHEEZING OR SHORTNESS OF BREATH 18 g 0   amLODipine (NORVASC) 5 MG tablet Take 5 mg by mouth daily.     buPROPion (WELLBUTRIN XL) 300 MG  24 hr tablet TAKE 1 TABLET BY MOUTH ONCE A DAY. 90 tablet 0   dicyclomine (BENTYL) 20 MG tablet TAKE 1 TABLET BY MOUTH TWICE DAILY. 20 tablet 0   famotidine (PEPCID) 20 MG tablet One after supper 30 tablet 11   fenofibrate (TRICOR) 145 MG tablet Take 1 tablet (145 mg total) by mouth daily. 90 tablet 3   hydrochlorothiazide (MICROZIDE) 12.5 MG capsule TAKE ONE CAPSULE BY MOUTH ONCE DAILY. 30 capsule 0   rizatriptan (MAXALT-MLT) 10 MG disintegrating tablet TAKE 1 TABLET BY MOUTH ONCE AS NEEDED FOR MIGRAINE. MAY REPEAT IN 2 HOURS IF NEEDED. 10 tablet 2   Tiotropium Bromide-Olodaterol 2.5-2.5 MCG/ACT AERS Inhale 2 puffs into the lungs daily. 4 g 5   oxyCODONE-acetaminophen (PERCOCET) 10-325 MG tablet Take 1 tablet by mouth every 6 (six) hours as needed for pain. (Patient not taking: Reported on 09/23/2022) 25 tablet 0   promethazine-dextromethorphan (PROMETHAZINE-DM) 6.25-15 MG/5ML syrup Take 5 mLs by mouth 4 (four) times daily as needed for cough. (Patient not taking: Reported on 09/23/2022) 200 mL 0   No facility-administered medications prior to visit.    Allergies  Allergen Reactions   Bee Venom Shortness Of Breath   Latex Itching   Lisinopril Cough    ROS Review of Systems  Constitutional:  Positive for fatigue. Negative for chills and fever.  HENT:  Negative for congestion, sinus pressure and sore throat.   Eyes:  Negative for pain and discharge.  Respiratory:  Positive for shortness of breath. Negative for cough.  Cardiovascular:  Negative for chest pain and palpitations.  Gastrointestinal:  Positive for nausea. Negative for abdominal pain, diarrhea and vomiting.  Endocrine: Negative for polydipsia and polyuria.  Genitourinary:  Negative for dysuria and hematuria.  Musculoskeletal:  Negative for neck pain and neck stiffness.  Skin:  Negative for rash.  Neurological:  Positive for headaches. Negative for dizziness and weakness.  Psychiatric/Behavioral:  Negative for agitation and  behavioral problems. The patient is nervous/anxious.       Objective:    Physical Exam Vitals reviewed.  Constitutional:      General: She is not in acute distress.    Appearance: She is obese. She is not diaphoretic.  HENT:     Head: Normocephalic and atraumatic.     Nose: No congestion.     Mouth/Throat:     Mouth: Mucous membranes are moist.     Pharynx: No posterior oropharyngeal erythema.  Eyes:     General: No scleral icterus.    Extraocular Movements: Extraocular movements intact.  Cardiovascular:     Rate and Rhythm: Normal rate and regular rhythm.     Pulses: Normal pulses.     Heart sounds: Normal heart sounds. No murmur heard. Pulmonary:     Breath sounds: No wheezing or rales.  Musculoskeletal:     Cervical back: Neck supple. No tenderness.     Right lower leg: No edema.     Left lower leg: No edema.  Skin:    General: Skin is warm.     Findings: No rash.  Neurological:     General: No focal deficit present.     Mental Status: She is alert and oriented to person, place, and time.  Psychiatric:        Mood and Affect: Mood normal.        Behavior: Behavior normal.     BP (!) 150/96 (BP Location: Right Arm, Cuff Size: Normal)   Pulse 90   Resp 18   Ht 5' (1.524 m)   Wt 231 lb 12.8 oz (105.1 kg)   SpO2 96%   BMI 45.27 kg/m  Wt Readings from Last 3 Encounters:  09/23/22 231 lb 12.8 oz (105.1 kg)  05/30/22 230 lb (104.3 kg)  05/16/22 237 lb (107.5 kg)    Lab Results  Component Value Date   TSH 2.710 11/22/2021   Lab Results  Component Value Date   WBC 16.2 (H) 02/11/2022   HGB 15.0 02/11/2022   HCT 44.9 02/11/2022   MCV 99.3 02/11/2022   PLT 283 02/11/2022   Lab Results  Component Value Date   NA 139 05/08/2022   K 3.5 05/08/2022   CO2 21 (L) 05/08/2022   GLUCOSE 75 05/08/2022   BUN 18 05/08/2022   CREATININE 0.86 05/08/2022   BILITOT 0.3 02/11/2022   ALKPHOS 54 02/11/2022   AST 18 02/11/2022   ALT 17 02/11/2022   PROT 7.1  02/11/2022   ALBUMIN 3.9 02/11/2022   CALCIUM 9.1 05/08/2022   ANIONGAP 8 05/08/2022   EGFR 89 11/22/2021   Lab Results  Component Value Date   CHOL 171 11/22/2021   Lab Results  Component Value Date   HDL 48 11/22/2021   Lab Results  Component Value Date   LDLCALC 105 (H) 11/22/2021   Lab Results  Component Value Date   TRIG 100 11/22/2021   Lab Results  Component Value Date   CHOLHDL 4.8 06/01/2020   Lab Results  Component Value Date   HGBA1C 5.3 11/22/2021  Assessment & Plan:   Problem List Items Addressed This Visit       Cardiovascular and Mediastinum   Essential hypertension - Primary    BP Readings from Last 1 Encounters:  09/23/22 (!) 150/96  Uncontrolled due to noncompliance Restart amlodipine 5 mg daily and HCTZ 12.5 mg QD Counseled for compliance with the medications Advised DASH diet and moderate exercise/walking, at least 150 mins/week      Relevant Medications   amLODipine (NORVASC) 5 MG tablet   hydrochlorothiazide (MICROZIDE) 12.5 MG capsule   Other Relevant Orders   TSH   CMP14+EGFR   CBC with Differential/Platelet   Migraine    Uncontrolled he has she has run out of her Maxalt, refilled If frequent episodes of migraine, will add topiramate - has tried it before      Relevant Medications   amLODipine (NORVASC) 5 MG tablet   hydrochlorothiazide (MICROZIDE) 12.5 MG capsule   buPROPion (WELLBUTRIN XL) 300 MG 24 hr tablet   rizatriptan (MAXALT-MLT) 10 MG disintegrating tablet   Other Relevant Orders   TSH   CMP14+EGFR   CBC with Differential/Platelet     Respiratory   Asthma, mild persistent    Well-controlled with Stiolto and albuterol as needed Needs to follow-up with pulmonology      Relevant Medications   albuterol (VENTOLIN HFA) 108 (90 Base) MCG/ACT inhaler   Tiotropium Bromide-Olodaterol 2.5-2.5 MCG/ACT AERS     Digestive   GERD (gastroesophageal reflux disease)    Well controlled with Pepcid      Relevant  Medications   famotidine (PEPCID) 20 MG tablet     Other   Tobacco abuse    Smokes about <1 pack/day, states she does not smoke daily now  Asked about quitting: confirms that he/she currently smokes cigarettes Advise to quit smoking: Educated about QUITTING to reduce the risk of cancer, cardio and cerebrovascular disease. Assess willingness: Unwilling to quit at this time, but is working on cutting back. Assist with counseling and pharmacotherapy: Counseled for 5 minutes and literature provided.  On Wellbutrin. Arrange for follow up: follow up in 3 months and continue to offer help.      Relevant Medications   buPROPion (WELLBUTRIN XL) 300 MG 24 hr tablet   Hyperlipidemia    Last lipid profile reviewed DC Fenofibrate to reduce pill burden Check lipid profile      Relevant Medications   amLODipine (NORVASC) 5 MG tablet   hydrochlorothiazide (MICROZIDE) 12.5 MG capsule   Other Relevant Orders   Lipid panel   Environmental and seasonal allergies    Takes Xyzal 5 mg QD      Relevant Medications   albuterol (VENTOLIN HFA) 108 (90 Base) MCG/ACT inhaler   Other Visit Diagnoses     Hyperglycemia       Relevant Orders   Hemoglobin A1c   Vitamin D deficiency       Relevant Orders   VITAMIN D 25 Hydroxy (Vit-D Deficiency, Fractures)   Need for immunization against influenza       Relevant Orders   Flu Vaccine QUAD 21moIM (Fluarix, Fluzone & Alfiuria Quad PF) (Completed)       Meds ordered this encounter  Medications   amLODipine (NORVASC) 5 MG tablet    Sig: Take 1 tablet (5 mg total) by mouth daily.    Dispense:  90 tablet    Refill:  0   hydrochlorothiazide (MICROZIDE) 12.5 MG capsule    Sig: Take 1 capsule (12.5 mg total) by mouth  daily.    Dispense:  90 capsule    Refill:  0   albuterol (VENTOLIN HFA) 108 (90 Base) MCG/ACT inhaler    Sig: Inhale 1-2 puffs into the lungs every 6 (six) hours as needed for wheezing or shortness of breath.    Dispense:  18 g     Refill:  2   buPROPion (WELLBUTRIN XL) 300 MG 24 hr tablet    Sig: Take 1 tablet (300 mg total) by mouth daily.    Dispense:  90 tablet    Refill:  1   famotidine (PEPCID) 20 MG tablet    Sig: One after supper    Dispense:  90 tablet    Refill:  3   rizatriptan (MAXALT-MLT) 10 MG disintegrating tablet    Sig: TAKE 1 TABLET BY MOUTH ONCE AS NEEDED FOR MIGRAINE. MAY REPEAT IN 2 HOURS IF NEEDED.    Dispense:  10 tablet    Refill:  2   Tiotropium Bromide-Olodaterol 2.5-2.5 MCG/ACT AERS    Sig: Inhale 2 puffs into the lungs daily.    Dispense:  4 g    Refill:  5    Follow-up: Return in about 6 weeks (around 11/04/2022) for Annual physical.    Lindell Spar, MD

## 2022-10-19 ENCOUNTER — Ambulatory Visit
Admission: EM | Admit: 2022-10-19 | Discharge: 2022-10-19 | Disposition: A | Discharge status: Home / Self Care | Payer: Medicaid Other | Attending: Nurse Practitioner | Admitting: Nurse Practitioner

## 2022-10-19 DIAGNOSIS — J45901 Unspecified asthma with (acute) exacerbation: Secondary | ICD-10-CM | POA: Diagnosis not present

## 2022-10-19 DIAGNOSIS — J019 Acute sinusitis, unspecified: Secondary | ICD-10-CM

## 2022-10-19 MED ORDER — AMOXICILLIN-POT CLAVULANATE 875-125 MG PO TABS: 1.0000 | ORAL_TABLET | Freq: Two times a day (BID) | ORAL | 0 refills | Status: DC

## 2022-10-19 MED ORDER — PREDNISONE 20 MG PO TABS: 40.0000 mg | ORAL_TABLET | Freq: Every day | ORAL | 0 refills | Status: AC

## 2022-10-19 MED ORDER — ALBUTEROL SULFATE (2.5 MG/3ML) 0.083% IN NEBU
2.5000 mg | INHALATION_SOLUTION | Freq: Once | RESPIRATORY_TRACT | Status: AC
  Administered 2022-10-19: 2.5 mg via RESPIRATORY_TRACT

## 2022-10-19 MED ORDER — METHYLPREDNISOLONE SODIUM SUCC 125 MG IJ SOLR
125.0000 mg | Freq: Once | INTRAMUSCULAR | Status: AC
  Administered 2022-10-19: 125 mg via INTRAMUSCULAR

## 2022-10-19 NOTE — ED Triage Notes (Signed)
Pt states headache, facial pressure and pressure behind her ears, cough and congestion for the past 4 days.  Has been taking OTC meds with no relief.

## 2022-10-19 NOTE — ED Provider Notes (Signed)
[facility]    CSN: [id number] Arrival date & time: 10/19/22  1251      History   Chief Complaint Chief Complaint  Patient presents with  . Cough    HPI Destiny Petty is a 41 y.o. female.   The history is provided by the patient.   Patient presents for complaints of a 3 to 4-day history of cough with wheezing, head congestion, sinus pressure, and nasal congestion.  She denies fever, chills, sore throat, chest pain, abdominal pain, nausea, vomiting, or diarrhea.  Patient states that she normally gets a sinus infection every time the weather changes.  She reports that she currently takes Xyzal and uses Flonase daily for her symptoms.  She states that she does have an albuterol inhaler that she uses regularly.  She denies any sick contacts.  Past Medical History:  Diagnosis Date  . Allergy    SEASONAL  . 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 Anxiety   . Arthritis 12/04/2016   Phreesia 05/23/2020  . Asthma   . Back pain with right-sided sciatica 06/18/2016  . Bipolar disorder (HCC) 01/15/2016   Overview:  Tried Depakote  . Body mass index 36.0-36.9, adult 04/10/2016  . Body mass index 37.0-37.9, adult 03/13/2016  . Depression   . Depressive disorder, not elsewhere classified 03/27/2007  . Dysmenorrhea 04/10/2016  . Family history of adverse reaction to anesthesia    PONV  . Gallstones 01/29/2016   Formatting of this note might be different from the original. Scheduled for Cholecystectomy  . GERD (gastroesophageal reflux disease)   . Hemorrhoids 02/10/2015  . Hiatal hernia   . Hyperlipidemia 04/09/2011  . Hypertension    no meds  . 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 Laceration of blood vessel of right little finger 08/23/2020  . Laceration of finger of left hand without damage to nail    left small finger  . Menorrhagia with regular cycle 04/10/2016  . Migraine 01/15/2016   Overview:  Overview:  IMO Load 2016 R1.3  . Muscle spasm of back 03/13/2016   . Numbness and tingling in left hand 08/19/2016  . Obesity   . Ovarian cyst 12/18/2016  . Perianal wart 02/10/2015  . Stress 02/14/2016  . Weight loss counseling, encounter for 03/13/2016    Patient Active Problem List   Diagnosis Date Noted  . Prolapsed internal hemorrhoids, grade 3   . DOE (dyspnea on exertion) 03/26/2021  . Cigarette smoker 03/26/2021  . Environmental and seasonal allergies 07/28/2020  . Arthritis of left knee 06/01/2020  . Encounter for general adult medical examination with abnormal findings 06/01/2020  . 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 Back pain with right-sided sciatica 06/18/2016  . Morbid obesity due to excess calories (HCC) 03/13/2016  . Migraine 01/15/2016  . Recurrent major depressive disorder, in full remission (HCC) 01/15/2016  . Hemorrhoid 02/10/2015  . Tobacco abuse 07/10/2013  . GERD (gastroesophageal reflux disease) 07/10/2013  . Essential hypertension 07/08/2013  . Asthma, mild persistent 04/05/2013  . Orthopnea 12/18/2011  . Hyperlipidemia 04/09/2011    Past Surgical History:  Procedure Laterality Date  . ABLATION    . CESAREAN SECTION    . CESAREAN SECTION N/A    Phreesia 05/23/2020  . CHOLECYSTECTOMY    . COLONOSCOPY    . DILATATION AND CURETTAGE/HYSTEROSCOPY WITH MINERVA N/A 08/31/2019   Procedure: DILATATION AND CURETTAGE (no specimen) /HYSTEROSCOPY WITH MINERVA ENDOMETRIAL ABLATION;  Surgeon: Tilda BurrowFerguson, John V, MD;  Location:  [facility];  Service: Gynecology;  Laterality: N/A;  . ESOPHAGOGASTRODUODENOSCOPY ENDOSCOPY    . HEMORRHOID SURGERY N/A 05/10/2022   Procedure: HEMORRHOIDECTOMY, EXTENSIVE;  Surgeon: Franky Macho, MD;  Location: [facility];  Service: General;  Laterality: N/A;  . TENDON EXPLORATION Left 08/10/2020   Procedure: LEFT SMALL FINER EXPLORATION, REPAIR OF TENDON, ARTERY, NERVE;  Surgeon: Betha Loa, MD;  Location: [facility];  Service: Orthopedics;  Laterality: Left;  . TUBAL LIGATION      OB  History     Gravida  2   Para  2   Term  2   Preterm      AB      Living  2      SAB      IAB      Ectopic      Multiple      Live Births  2            Home Medications    Prior to Admission medications   Medication Sig Start Date End Date Taking? Authorizing Provider  albuterol (VENTOLIN HFA) 108 (90 Base) MCG/ACT inhaler Inhale 1-2 puffs into the lungs every 6 (six) hours as needed for wheezing or shortness of breath. 09/23/22   Anabel Halon, MD  amLODipine (NORVASC) 5 MG tablet Take 1 tablet (5 mg total) by mouth daily. 09/23/22   Anabel Halon, MD  buPROPion (WELLBUTRIN XL) 300 MG 24 hr tablet Take 1 tablet (300 mg total) by mouth daily. 09/23/22   Anabel Halon, MD  CRANBERRY PO Take 1 tablet by mouth daily as needed (clean out system).    [provider]  famotidine (PEPCID) 20 MG tablet One after supper 09/23/22   Anabel Halon, MD  fluticasone (FLONASE) 50 MCG/ACT nasal spray INSTILL 2 SPRAYS INTO BOTH NOSTRILS DAILY. 05/23/22   Anabel Halon, MD  hydrochlorothiazide (MICROZIDE) 12.5 MG capsule Take 1 capsule (12.5 mg total) by mouth daily. 09/23/22   Anabel Halon, MD  hydrocortisone-pramoxine [facility]) rectal foam Place 1 applicator rectally 2 (two) times daily. 04/12/22   Paseda, Baird Kay, FNP  levocetirizine (XYZAL) 5 MG tablet Take 1 tablet (5 mg total) by mouth every evening. 02/10/22   Wallis Bamberg, PA-C  Omega-3 Fatty Acids (FISH OIL) 1000 MG CAPS Take 3 capsules (3,000 mg total) by mouth daily. Patient taking differently: Take 1,000 mg by mouth 2 (two) times daily. 10/30/21   Kerri Perches, MD  rizatriptan (MAXALT-MLT) 10 MG disintegrating tablet TAKE 1 TABLET BY MOUTH ONCE AS NEEDED FOR MIGRAINE. MAY REPEAT IN 2 HOURS IF NEEDED. 09/23/22   Anabel Halon, MD  Tiotropium Bromide-Olodaterol 2.5-2.5 MCG/ACT AERS Inhale 2 puffs into the lungs daily. 09/23/22   Anabel Halon, MD    Family History Family History   Problem Relation Age of Onset  . Diabetes Mother   . Kidney failure Mother   . Early death Mother 35       diabetes complications  . Cancer Father 80       colon cancer  . Depression Sister   . Hypertension Sister   . Anxiety disorder Sister   . Other Sister        back problems  . ADD / ADHD Daughter   . Other Daughter        behavioral issues  . ODD Daughter   . Asthma Son   . Cancer Maternal Grandmother        breast  .  Seizures Maternal Grandmother   . Lupus Other   . Breast cancer Maternal Aunt     Social History Social History   Tobacco Use  . Smoking status: Every Day    Packs/day: 1.00    Years: 26.00    Total pack years: 26.00    Types: Cigarettes  . Smokeless tobacco: Never  . Tobacco comments:    1/4 ppd 05/30/21  Vaping Use  . Vaping Use: Never used  Substance Use Topics  . Alcohol use: Yes    Comment: occ  . Drug use: No     Allergies   Bee venom, Latex, and Lisinopril   Review of Systems Review of Systems Per HPI  Physical Exam Triage Vital Signs ED Triage Vitals  Enc Vitals Group     BP 10/19/22 1510 (!) 142/88     Pulse Rate 10/19/22 1510 89     Resp 10/19/22 1510 16     Temp 10/19/22 1510 97.8 F (36.6 C)     Temp Source 10/19/22 1510 Oral     SpO2 10/19/22 1510 98 %     Weight --      Height --      Head Circumference --      Peak Flow --      Pain Score 10/19/22 1511 8     Pain Loc --      Pain Edu? --      Excl. in GC? --    No data found.  Updated Vital Signs BP (!) 142/88 (BP Location: Right Arm)   Pulse 89   Temp 97.8 F (36.6 C) (Oral)   Resp 16   SpO2 98%   Visual Acuity Right Eye Distance:   Left Eye Distance:   Bilateral Distance:    Right Eye Near:   Left Eye Near:    Bilateral Near:     Physical Exam Vitals and nursing note reviewed.  Constitutional:      General: She is not in acute distress.    Appearance: Normal appearance.  HENT:     Head: Normocephalic.     Right Ear: Tympanic  membrane, ear canal and external ear normal.     Left Ear: Tympanic membrane, ear canal and external ear normal.     Nose: Congestion present. No rhinorrhea.     Mouth/Throat:     Mouth: Mucous membranes are moist.     Pharynx: Posterior oropharyngeal erythema present.  Eyes:     Extraocular Movements: Extraocular movements intact.     Conjunctiva/sclera: Conjunctivae normal.     Pupils: Pupils are equal, round, and reactive to light.  Cardiovascular:     Rate and Rhythm: Normal rate and regular rhythm.     Pulses: Normal pulses.     Heart sounds: Normal heart sounds.  Pulmonary:     Effort: Pulmonary effort is normal.     Breath sounds: Wheezing (posterior lung fields) present.     Comments: Post nebulizer treatment, decreased wheezing noted through the posterior lung fields.  Patient reports improvement of her breathing. Abdominal:     General: Bowel sounds are normal.     Palpations: Abdomen is soft.     Tenderness: There is no abdominal tenderness.  Musculoskeletal:     Cervical back: Normal range of motion.  Skin:    General: Skin is warm and dry.  Neurological:     General: No focal deficit present.     Mental Status: She is alert and oriented to  person, place, and time.  Psychiatric:        Mood and Affect: Mood normal.        Behavior: Behavior normal.     UC Treatments / Results  Labs (all labs ordered are listed, but only abnormal results are displayed) Labs Reviewed - No data to display  EKG   Radiology No results found.  Procedures Procedures (including critical care time)  Medications Ordered in UC Medications  methylPREDNISolone sodium succinate (SOLU-MEDROL) 125 mg/2 mL injection 125 mg (125 mg Intramuscular Given 10/19/22 1533)  albuterol (PROVENTIL) (2.5 MG/3ML) 0.083% nebulizer solution 2.5 mg (2.5 mg Nebulization Given 10/19/22 1533)    Initial Impression / Assessment and Plan / UC Course  I have reviewed the triage vital signs and the  nursing notes.  Pertinent labs & imaging results that were available during my care of the patient were reviewed by me and considered in my medical decision making (see chart for details).     *** Final Clinical Impressions(s) / UC Diagnoses   Final diagnoses:  None   Discharge Instructions   None    ED Prescriptions   None    PDMP not reviewed this encounter.

## 2022-10-19 NOTE — Discharge Instructions (Signed)
Take medication as directed. Continue your current allergy medication regimen. Increase fluids and get plenty of rest. May take over-the-counter ibuprofen or Tylenol as needed for pain, fever, or general discomfort. Recommend normal saline nasal spray to help with nasal congestion throughout the day. For your cough, it may be helpful to use a humidifier at bedtime during sleep and to sleep elevated on pillows. If your symptoms worsen such as worsening cough, difficulty breathing, trouble breathing, or inability to speak in a complete sentence, please go to the emergency department immediately. If symptoms do not improve after this treatment provided today, please follow-up with your primary care physician for further evaluation.

## 2022-10-22 ENCOUNTER — Ambulatory Visit: Payer: Medicaid Other | Admitting: Internal Medicine

## 2022-12-12 ENCOUNTER — Other Ambulatory Visit: Payer: Self-pay

## 2022-12-12 ENCOUNTER — Telehealth: Payer: Self-pay | Admitting: Internal Medicine

## 2022-12-12 DIAGNOSIS — Z720 Tobacco use: Secondary | ICD-10-CM

## 2022-12-12 DIAGNOSIS — I10 Essential (primary) hypertension: Secondary | ICD-10-CM

## 2022-12-12 DIAGNOSIS — J3089 Other allergic rhinitis: Secondary | ICD-10-CM

## 2022-12-12 DIAGNOSIS — J4530 Mild persistent asthma, uncomplicated: Secondary | ICD-10-CM

## 2022-12-12 MED ORDER — ALBUTEROL SULFATE HFA 108 (90 BASE) MCG/ACT IN AERS: 1.0000 | INHALATION_SPRAY | Freq: Four times a day (QID) | RESPIRATORY_TRACT | 2 refills | Status: DC | PRN

## 2022-12-12 MED ORDER — BUPROPION HCL ER (XL) 300 MG PO TB24: 300.0000 mg | ORAL_TABLET | Freq: Every day | ORAL | 1 refills | Status: DC

## 2022-12-12 MED ORDER — AMLODIPINE BESYLATE 5 MG PO TABS: 5.0000 mg | ORAL_TABLET | Freq: Every day | ORAL | 0 refills | Status: DC

## 2022-12-12 MED ORDER — FLUTICASONE PROPIONATE 50 MCG/ACT NA SUSP
2.0000 | Freq: Every day | NASAL | 0 refills | Status: DC
Start: 2022-12-12 — End: 2023-03-21

## 2022-12-12 MED ORDER — HYDROCHLOROTHIAZIDE 12.5 MG PO CAPS: 12.5000 mg | ORAL_CAPSULE | Freq: Every day | ORAL | 0 refills | Status: DC

## 2022-12-12 NOTE — Telephone Encounter (Signed)
Pt has flu & unable to make appt tomorrow & r/s to 2/6. Wants to know if she can get her meds refilled?   Prescription Request  12/12/2022  Is this a "Controlled Substance" medicine? No  LOV: 09/23/2022  What is the name of the medication or equipment?  fluticasone (FLONASE) 50 MCG/ACT nasal spray  albuterol (VENTOLIN HFA) 108 (90 Base) MCG/ACT inhaler  rizatriptan (MAXALT-MLT) 10 MG disintegrating tablet  buPROPion (WELLBUTRIN XL) 300 MG 24 hr tablet  Fenofibrate???? hydrochlorothiazide (MICROZIDE) 12.5 MG capsules amLODipine (NORVASC) 5 MG tablet   Have you contacted your pharmacy to request a refill? Yes   Which pharmacy would you like this sent to?  [place], [address] [place] [address] Phone: [phone] Fax: [phone]  [place], [place] - [place] [address] [place] [address] Phone: [phone] Fax: [phone]    Patient notified that their request is being sent to the clinical staff for review and that they should receive a response within 2 business days.   Please advise at [facility] [phone]

## 2022-12-12 NOTE — Telephone Encounter (Signed)
Refills sent to pharmacy. 

## 2022-12-13 ENCOUNTER — Encounter: Payer: Medicaid Other | Admitting: Internal Medicine

## 2022-12-14 ENCOUNTER — Ambulatory Visit
Admission: EM | Admit: 2022-12-14 | Discharge: 2022-12-14 | Disposition: A | Discharge status: Home / Self Care | Payer: Medicaid Other | Attending: Nurse Practitioner | Admitting: Nurse Practitioner

## 2022-12-14 ENCOUNTER — Encounter: Payer: Self-pay | Admitting: Emergency Medicine

## 2022-12-14 DIAGNOSIS — J45901 Unspecified asthma with (acute) exacerbation: Secondary | ICD-10-CM | POA: Diagnosis not present

## 2022-12-14 DIAGNOSIS — J069 Acute upper respiratory infection, unspecified: Secondary | ICD-10-CM

## 2022-12-14 MED ORDER — PREDNISONE 20 MG PO TABS: 40.0000 mg | ORAL_TABLET | Freq: Every day | ORAL | 0 refills | Status: AC

## 2022-12-14 MED ORDER — PSEUDOEPH-BROMPHEN-DM 30-2-10 MG/5ML PO SYRP: 5.0000 mL | ORAL_SOLUTION | Freq: Four times a day (QID) | ORAL | 0 refills | Status: DC | PRN

## 2022-12-14 MED ORDER — CETIRIZINE HCL 10 MG PO TABS: 10.0000 mg | ORAL_TABLET | Freq: Every day | ORAL | 0 refills | Status: DC

## 2022-12-14 NOTE — Discharge Instructions (Addendum)
Take medication as directed.  Your primary care physician has sent in prescriptions for your albuterol inhaler. Increase fluids and get plenty of rest. May take over-the-counter ibuprofen or Tylenol as needed for pain, fever, or general discomfort. Recommend normal saline nasal spray to help with nasal congestion throughout the day. For your cough and nasal congestion, it may be helpful to use a humidifier at bedtime during sleep and sleeping elevated on pillows.. As discussed, if you begin to experience a sudden worsening of your symptoms, or if the symptoms fail to improve within the next 7 to 10 days, please follow-up in this clinic or with your primary care physician for further evaluation. Follow-up as needed.

## 2022-12-14 NOTE — ED Provider Notes (Signed)
[facility]    CSN: [id number] Arrival date & time: 12/14/22  4098      History   Chief Complaint No chief complaint on file.   HPI Destiny Petty is a 42 y.o. female.   The history is provided by the patient.   The patient presents with a 3-day history of sinus pressure, nasal congestion, and head congestion.  She also reports that she has pain behind her ears and a headache.  Patient reports that she was diagnosed with influenza over the past week.  She denies ear drainage, sore throat, abdominal pain, nausea, vomiting, or diarrhea.  Patient reports a history of asthma and seasonal allergies.  She reports that she has been out of her albuterol inhaler, and missed her appointment to see her PCP because she has been sick.  Patient reports she has been taking over-the-counter cough and cold medications along with Flonase for her symptoms.  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 Scheduled for Cholecystectomy   GERD (gastroesophageal reflux disease)    Hemorrhoids 02/10/2015   Hiatal hernia    Hyperlipidemia 04/09/2011   Hypertension    no meds   Injury of digital nerve of right little finger 08/23/2020   Laceration of blood vessel of right little finger 08/23/2020   Laceration of finger of left hand without damage to nail    left small finger   Menorrhagia with regular cycle 04/10/2016   Migraine 01/15/2016    Overview:  Overview:  IMO Load 2016 R1.3   Muscle spasm of back 03/13/2016   Numbness and tingling in left hand 08/19/2016   Obesity    Ovarian cyst 12/18/2016   Perianal wart 02/10/2015   Stress 02/14/2016   Weight loss counseling, encounter for 03/13/2016    Patient Active Problem List   Diagnosis Date Noted   Prolapsed internal hemorrhoids, grade 3    DOE (dyspnea on exertion) 03/26/2021   Cigarette smoker 03/26/2021   Environmental and seasonal allergies 07/28/2020   Arthritis of left knee 06/01/2020   Encounter for general adult medical examination with abnormal findings 06/01/2020   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   Back pain with right-sided sciatica 06/18/2016   Morbid obesity due to excess calories (HCC) 03/13/2016   Migraine 01/15/2016   Recurrent major depressive disorder, in full remission (HCC) 01/15/2016   Hemorrhoid 02/10/2015   Tobacco abuse 07/10/2013   GERD (gastroesophageal reflux disease) 07/10/2013   Essential hypertension 07/08/2013   Asthma, mild persistent 04/05/2013   Orthopnea 12/18/2011   Hyperlipidemia 04/09/2011    Past Surgical History:  Procedure Laterality Date   ABLATION     CESAREAN SECTION     CESAREAN SECTION N/A    Phreesia 05/23/2020   CHOLECYSTECTOMY     COLONOSCOPY     DILATATION AND CURETTAGE/HYSTEROSCOPY WITH MINERVA N/A 08/31/2019   Procedure: DILATATION  AND CURETTAGE (no specimen) /HYSTEROSCOPY WITH MINERVA ENDOMETRIAL ABLATION;  Surgeon: Tilda Burrow, MD;  Location: [facility];  Service: Gynecology;  Laterality: N/A;   ESOPHAGOGASTRODUODENOSCOPY ENDOSCOPY     HEMORRHOID SURGERY N/A 05/10/2022   Procedure: HEMORRHOIDECTOMY, EXTENSIVE;  Surgeon: Franky Macho, MD;  Location: [facility];  Service: General;  Laterality: N/A;   TENDON EXPLORATION Left 08/10/2020   Procedure: LEFT SMALL FINER EXPLORATION, REPAIR OF TENDON, ARTERY, NERVE;  Surgeon: Betha Loa, MD;  Location: [facility];  Service: Orthopedics;   Laterality: Left;   TUBAL LIGATION      OB History     Gravida  2   Para  2   Term  2   Preterm      AB      Living  2      SAB      IAB      Ectopic      Multiple      Live Births  2            Home Medications    Prior to Admission medications   Medication Sig Start Date End Date Taking? Authorizing Provider  brompheniramine-pseudoephedrine-DM 30-2-10 MG/5ML syrup Take 5 mLs by mouth 4 (four) times daily as needed. 12/14/22  Yes Kealan Buchan-Warren, Sadie Haber, NP  cetirizine (ZYRTEC) 10 MG tablet Take 1 tablet (10 mg total) by mouth daily. 12/14/22  Yes Marios Gaiser-Warren, Sadie Haber, NP  predniSONE (DELTASONE) 20 MG tablet Take 2 tablets (40 mg total) by mouth daily with breakfast for 5 days. 12/14/22 12/19/22 Yes Lan Mcneill-Warren, Sadie Haber, NP  albuterol (VENTOLIN HFA) 108 (90 Base) MCG/ACT inhaler Inhale 1-2 puffs into the lungs every 6 (six) hours as needed for wheezing or shortness of breath. 12/12/22   Anabel Halon, MD  amLODipine (NORVASC) 5 MG tablet Take 1 tablet (5 mg total) by mouth daily. 12/12/22   Anabel Halon, MD  amoxicillin-clavulanate (AUGMENTIN) 875-125 MG tablet Take 1 tablet by mouth every 12 (twelve) hours. 10/19/22   Keyan Folson-Warren, Sadie Haber, NP  buPROPion (WELLBUTRIN XL) 300 MG 24 hr tablet Take 1 tablet (300 mg total) by mouth daily. 12/12/22   Anabel Halon, MD  CRANBERRY PO Take 1 tablet by mouth daily as needed (clean out system).    [provider]  famotidine (PEPCID) 20 MG tablet One after supper 09/23/22   Anabel Halon, MD  fluticasone [facility]) 50 MCG/ACT nasal spray Place 2 sprays into both nostrils daily. 12/12/22   Anabel Halon, MD  hydrochlorothiazide (MICROZIDE) 12.5 MG capsule Take 1 capsule (12.5 mg total) by mouth daily. 12/12/22   Anabel Halon, MD  hydrocortisone-pramoxine [facility]) rectal foam Place 1 applicator rectally 2 (two) times daily. 04/12/22   Paseda, Baird Kay, FNP  levocetirizine (XYZAL) 5 MG tablet Take 1  tablet (5 mg total) by mouth every evening. 02/10/22   Wallis Bamberg, PA-C  Omega-3 Fatty Acids (FISH OIL) 1000 MG CAPS Take 3 capsules (3,000 mg total) by mouth daily. Patient taking differently: Take 1,000 mg by mouth 2 (two) times daily. 10/30/21   Kerri Perches, MD  rizatriptan (MAXALT-MLT) 10 MG disintegrating tablet TAKE 1 TABLET BY MOUTH ONCE AS NEEDED FOR MIGRAINE. MAY REPEAT IN 2 HOURS IF NEEDED. 09/23/22   Anabel Halon, MD  Tiotropium Bromide-Olodaterol 2.5-2.5 MCG/ACT AERS Inhale 2 puffs into the lungs daily. 09/23/22   Anabel Halon, MD    Family History Family History  Problem Relation Age of Onset  Diabetes Mother    Kidney failure Mother    Early death Mother 60       diabetes complications   Cancer Father 58       colon cancer   Depression Sister    Hypertension Sister    Anxiety disorder Sister    Other Sister        back problems   ADD / ADHD Daughter    Other Daughter        behavioral issues   ODD Daughter    Asthma Son    Cancer Maternal Grandmother        breast   Seizures Maternal Grandmother    Lupus Other    Breast cancer Maternal Aunt     Social History Social History   Tobacco Use   Smoking status: Every Day    Packs/day: 1.00    Years: 26.00    Total pack years: 26.00    Types: Cigarettes   Smokeless tobacco: Never   Tobacco comments:    1/4 ppd 05/30/21  Vaping Use   Vaping Use: Never used  Substance Use Topics   Alcohol use: Yes    Comment: occ   Drug use: No     Allergies   Bee venom, Latex, and Lisinopril   Review of Systems Review of Systems Per HPI  Physical Exam Triage Vital Signs ED Triage Vitals  Enc Vitals Group     BP 12/14/22 0837 134/79     Pulse Rate 12/14/22 0837 89     Resp 12/14/22 0837 18     Temp 12/14/22 0837 97.7 F (36.5 C)     Temp Source 12/14/22 0837 Oral     SpO2 12/14/22 0837 97 %     Weight --      Height --      Head Circumference --      Peak Flow --      Pain Score 12/14/22  0838 8     Pain Loc --      Pain Edu? --      Excl. in [place]? --    No data found.  Updated Vital Signs BP 134/79 (BP Location: Right Arm)   Pulse 89   Temp 97.7 F (36.5 C) (Oral)   Resp 18   SpO2 97%   Visual Acuity Right Eye Distance:   Left Eye Distance:   Bilateral Distance:    Right Eye Near:   Left Eye Near:    Bilateral Near:     Physical Exam Vitals and nursing note reviewed.  Constitutional:      General: She is not in acute distress.    Appearance: Normal appearance.  HENT:     Head: Normocephalic.     Right Ear: Tympanic membrane, ear canal and external ear normal.     Left Ear: Tympanic membrane, ear canal and external ear normal.     Nose: Congestion present. No rhinorrhea.     Mouth/Throat:     Mouth: Mucous membranes are moist.     Pharynx: Posterior oropharyngeal erythema present. No oropharyngeal exudate.  Eyes:     Extraocular Movements: Extraocular movements intact.     Conjunctiva/sclera: Conjunctivae normal.     Pupils: Pupils are equal, round, and reactive to light.  Cardiovascular:     Rate and Rhythm: Normal rate and regular rhythm.     Pulses: Normal pulses.     Heart sounds: Normal heart sounds.  Pulmonary:     Effort: Pulmonary  effort is normal.     Breath sounds: Examination of the right-upper field reveals wheezing. Examination of the right-lower field reveals wheezing. Wheezing present.     Comments: Faint wheezes noted in the posterior right upper and lower lobes.  Patient is speaking in complete sentences, and is in no acute distress. Abdominal:     General: Bowel sounds are normal.     Palpations: Abdomen is soft.     Tenderness: There is no abdominal tenderness.  Musculoskeletal:     Cervical back: Normal range of motion.  Lymphadenopathy:     Cervical: No cervical adenopathy.  Skin:    General: Skin is warm and dry.  Neurological:     General: No focal deficit present.     Mental Status: She is alert and oriented to person,  place, and time.  Psychiatric:        Mood and Affect: Mood normal.        Behavior: Behavior normal.      UC Treatments / Results  Labs (all labs ordered are listed, but only abnormal results are displayed) Labs Reviewed - No data to display  EKG   Radiology No results found.  Procedures Procedures (including critical care time)  Medications Ordered in UC Medications - No data to display  Initial Impression / Assessment and Plan / UC Course  I have reviewed the triage vital signs and the nursing notes.  Pertinent labs & imaging results that were available during my care of the patient were reviewed by me and considered in my medical decision making (see chart for details).  The patient is well-appearing, she is in no acute distress, vital signs are stable.  Suspect a viral upper respiratory infection at this time.  Symptoms acute with a viral sinusitis.  Will start patient on Bromfed-DM for her cough and nasal congestion, and cetirizine 10 mg for her nasal congestion.  Patient is currently taking Flonase.  Patient was also prescribed prednisone 40 mg for her wheezing.  She does have prescriptions available for albuterol prescribed by her PCP.  Supportive care recommendations were provided to the patient to include increasing fluids, allowing for plenty of rest, and use of a humidifier during bedtime during sleep.  Advised patient that if symptoms do not improve over the next 7 to 10 days recommend follow-up for further evaluation.  Patient verbalizes understanding.  All questions were answered.  Patient is stable for discharge.   Final Clinical Impressions(s) / UC Diagnoses   Final diagnoses:  Acute upper respiratory infection  Mild asthma with acute exacerbation, unspecified whether persistent     Discharge Instructions      Take medication as directed.  Your primary care physician has sent in prescriptions for your albuterol inhaler. Increase fluids and get plenty of  rest. May take over-the-counter ibuprofen or Tylenol as needed for pain, fever, or general discomfort. Recommend normal saline nasal spray to help with nasal congestion throughout the day. For your cough and nasal congestion, it may be helpful to use a humidifier at bedtime during sleep and sleeping elevated on pillows.. As discussed, if you begin to experience a sudden worsening of your symptoms, or if the symptoms fail to improve within the next 7 to 10 days, please follow-up in this clinic or with your primary care physician for further evaluation. Follow-up as needed.     ED Prescriptions     Medication Sig Dispense Auth. Provider   brompheniramine-pseudoephedrine-DM 30-2-10 MG/5ML syrup Take 5 mLs by mouth  4 (four) times daily as needed. 140 mL Yoceline Bazar-Warren, Sadie Haber, NP   cetirizine (ZYRTEC) 10 MG tablet Take 1 tablet (10 mg total) by mouth daily. 30 tablet Manveer Gomes-Warren, Sadie Haber, NP   predniSONE (DELTASONE) 20 MG tablet Take 2 tablets (40 mg total) by mouth daily with breakfast for 5 days. 10 tablet Gyselle Matthew-Warren, Sadie Haber, NP      PDMP not reviewed this encounter.   Abran Cantor, NP 12/14/22 [phone]

## 2022-12-14 NOTE — ED Triage Notes (Signed)
States has flu last week.  Pain and swelling around nose and sinus x 3 days.  Pain behind ears and headache.

## 2023-01-01 ENCOUNTER — Other Ambulatory Visit: Payer: Self-pay

## 2023-01-01 ENCOUNTER — Encounter: Payer: Self-pay | Admitting: Emergency Medicine

## 2023-01-01 ENCOUNTER — Ambulatory Visit (INDEPENDENT_AMBULATORY_CARE_PROVIDER_SITE_OTHER): Payer: Medicaid Other

## 2023-01-01 ENCOUNTER — Ambulatory Visit
Admission: EM | Admit: 2023-01-01 | Discharge: 2023-01-01 | Disposition: A | Discharge status: Home / Self Care | Payer: Medicaid Other | Attending: Family Medicine | Admitting: Family Medicine

## 2023-01-01 DIAGNOSIS — R059 Cough, unspecified: Secondary | ICD-10-CM | POA: Diagnosis not present

## 2023-01-01 DIAGNOSIS — R0602 Shortness of breath: Secondary | ICD-10-CM

## 2023-01-01 DIAGNOSIS — J4521 Mild intermittent asthma with (acute) exacerbation: Secondary | ICD-10-CM | POA: Diagnosis not present

## 2023-01-01 DIAGNOSIS — J069 Acute upper respiratory infection, unspecified: Secondary | ICD-10-CM | POA: Diagnosis not present

## 2023-01-01 MED ORDER — DEXAMETHASONE SODIUM PHOSPHATE 10 MG/ML IJ SOLN
10.0000 mg | Freq: Once | INTRAMUSCULAR | Status: AC
  Administered 2023-01-01: 10 mg via INTRAMUSCULAR

## 2023-01-01 MED ORDER — AZITHROMYCIN 250 MG PO TABS: 250.0000 mg | ORAL_TABLET | Freq: Every day | ORAL | 0 refills | Status: DC

## 2023-01-01 NOTE — Discharge Instructions (Signed)
Meds ordered this encounter  Medications   azithromycin (ZITHROMAX) 250 MG tablet    Sig: Take 1 tablet (250 mg total) by mouth daily. Take first 2 tablets together, then 1 every day until finished.    Dispense:  6 tablet    Refill:  0   dexamethasone (DECADRON) injection 10 mg

## 2023-01-01 NOTE — ED Triage Notes (Signed)
Pt reports nasal congestion, bilateral ear pain, loss of taste and smell sine dec 31. Pt reports was exposed to covid and reports has tested negative at home but reports symptoms remain. Was seen for same beginning of January and reports was started on prednisone with no change in symptoms.

## 2023-01-01 NOTE — ED Provider Notes (Signed)
[facility]   [id number] 01/01/23 Arrival Time: 0175  ASSESSMENT & PLAN:  1. Viral URI with cough   2. Mild intermittent asthma with acute exacerbation    I have personally viewed the imaging studies ordered this visit. Normal CXR.  Given duration of symptoms, will treat for atypical infection and asthma exacerbation. No resp distress here. Discussed. OTC symptom care as needed.  Meds ordered this encounter  Medications   azithromycin (ZITHROMAX) 250 MG tablet    Sig: Take 1 tablet (250 mg total) by mouth daily. Take first 2 tablets together, then 1 every day until finished.    Dispense:  6 tablet    Refill:  0   dexamethasone (DECADRON) injection 10 mg     Follow-up Information     Lindell Spar, MD.   Specialty: Internal Medicine Why: If worsening or failing to improve as anticipated. Contact information: [address] [place] [address] [phone]                Agrees to ED eval should she significantly worsen. Reviewed expectations re: course of current medical issues. Questions answered. Outlined signs and symptoms indicating need for more acute intervention. Understanding verbalized. After Visit Summary given.   SUBJECTIVE: History from: Patient. Destiny Petty is a 42 y.o. female. Reports: nasal congestion, bilateral ear pain, loss of taste and smell sine dec 31. Pt reports was exposed to covid and reports has tested negative at home but reports symptoms remain. Was seen for same beginning of January and reports was started on prednisone with no change in symptoms. "Just haven't gotten better over the past few weeks". With wheezing; h/o asthma. Normal PO intake without n/v/d.  OBJECTIVE:  Vitals:   01/01/23 0821  BP: 133/89  Pulse: 73  Resp: 20  Temp: 98.1 F (36.7 C)  TempSrc: Oral  SpO2: 95%    General appearance: alert; no distress Eyes: PERRLA; EOMI; conjunctiva normal HENT: [place]; AT; with mild nasal  congestion Neck: supple  Lungs: speaks full sentences without difficulty; unlabored; active coughing; slightly coarse breath sounds with mild exp wheezing present Extremities: no edema Skin: warm and dry Neurologic: normal gait Psychological: alert and cooperative; normal mood and affect  Imaging: DG Chest 2 View  Result Date: 01/01/2023 CLINICAL DATA:  cough; SOB EXAM: CHEST - 2 VIEW COMPARISON:  06/02/2021. FINDINGS: The heart size and mediastinal contours are within normal limits. Both lungs are clear. No pneumothorax or pleural effusion. There are thoracic degenerative changes. IMPRESSION: No active cardiopulmonary disease. Electronically Signed   By: Sammie Bench M.D.   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 Scheduled for Cholecystectomy   GERD (gastroesophageal reflux disease)  Hemorrhoids 02/10/2015   Hiatal hernia    Hyperlipidemia 04/09/2011   Hypertension    no meds   Injury of digital nerve of right little finger 08/23/2020   Laceration of blood vessel of right little finger 08/23/2020   Laceration of finger of left hand without damage to nail    left small finger   Menorrhagia with regular cycle 04/10/2016   Migraine 01/15/2016   Overview:  Overview:  IMO Load 2016 R1.3   Muscle spasm of  back 03/13/2016   Numbness and tingling in left hand 08/19/2016   Obesity    Ovarian cyst 12/18/2016   Perianal wart 02/10/2015   Stress 02/14/2016   Weight loss counseling, encounter for 03/13/2016   Social History   Socioeconomic History   Marital status: Legally Separated    Spouse name: Not on file   Number of children: 2   Years of education: 14   Highest education level: Not on file  Occupational History   Not on file  Tobacco Use   Smoking status: Every Day    Packs/day: 1.00    Years: 26.00    Total pack years: 26.00    Types: Cigarettes   Smokeless tobacco: Never   Tobacco comments:    1/4 ppd 05/30/21  Vaping Use   Vaping Use: Never used  Substance and Sexual Activity   Alcohol use: Yes    Comment: occ   Drug use: No   Sexual activity: Not Currently    Birth control/protection: Surgical    Comment: tubal  Other Topics Concern   Not on file  Social History Narrative   Legally separated right now   2 children: son 53 and daughter 3 lives with her      2 dogs: macho, mary jane   2 birds:    1 cats: boghgie       Enjoys: spending time with kids, outside things      Diet: eat all food groups    Caffeine: coffee and soda daily   Water: 4 cups daily      Wears seat belt   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   No weapons        Social Determinants of Health   Financial Resource Strain: Low Risk  (06/01/2020)   Overall Financial Resource Strain (CARDIA)    Difficulty of Paying Living Expenses: Not hard at all  Food Insecurity: No Food Insecurity (06/01/2020)   Hunger Vital Sign    Worried About Running Out of Food in the Last Year: Never true    Ran Out of Food in the Last Year: Never true  Transportation Needs: No Transportation Needs (06/01/2020)   PRAPARE - Administrator, Civil Service (Medical): No    Lack of Transportation (Non-Medical): No  Physical Activity: Insufficiently Active (06/01/2020)   Exercise Vital Sign    Days  of Exercise per Week: 2 days    Minutes of Exercise per Session: 30 min  Stress: No Stress Concern Present (06/01/2020)   [employer] of Occupational Health - Occupational Stress Questionnaire    Feeling of Stress : Not at all  Social Connections: Moderately Isolated (06/01/2020)   Social Connection and Isolation Panel [NHANES]    Frequency of Communication with Friends and Family: More than three times a week    Frequency of Social Gatherings with Friends and Family: More than three times a week    Attends Religious Services: More than  4 times per year    Active Member of Clubs or Organizations: No    Attends Archivist Meetings: Never    Marital Status: Separated  Intimate Partner Violence: Not At Risk (06/01/2020)   Humiliation, Afraid, Rape, and Kick questionnaire    Fear of Current or Ex-Partner: No    Emotionally Abused: No    Physically Abused: No    Sexually Abused: No   Family History  Problem Relation Age of Onset   Diabetes Mother    Kidney failure Mother    Early death Mother 68       diabetes complications   Cancer Father 16       colon cancer   Depression Sister    Hypertension Sister    Anxiety disorder Sister    Other Sister        back problems   ADD / ADHD Daughter    Other Daughter        behavioral issues   ODD Daughter    Asthma Son    Cancer Maternal Grandmother        breast   Seizures Maternal Grandmother    Lupus Other    Breast cancer Maternal Aunt    Past Surgical History:  Procedure Laterality Date   ABLATION     CESAREAN SECTION     CESAREAN SECTION N/A    Phreesia 05/23/2020   CHOLECYSTECTOMY     COLONOSCOPY     DILATATION AND CURETTAGE/HYSTEROSCOPY WITH MINERVA N/A 08/31/2019   Procedure: DILATATION AND CURETTAGE (no specimen) /HYSTEROSCOPY WITH MINERVA ENDOMETRIAL ABLATION;  Surgeon: Jonnie Kind, MD;  Location: [facility];  Service: Gynecology;  Laterality: N/A;   ESOPHAGOGASTRODUODENOSCOPY ENDOSCOPY     HEMORRHOID  SURGERY N/A 05/10/2022   Procedure: HEMORRHOIDECTOMY, EXTENSIVE;  Surgeon: Aviva Signs, MD;  Location: [facility];  Service: General;  Laterality: N/A;   TENDON EXPLORATION Left 08/10/2020   Procedure: LEFT SMALL FINER EXPLORATION, REPAIR OF TENDON, ARTERY, NERVE;  Surgeon: Leanora Cover, MD;  Location: [facility];  Service: Orthopedics;  Laterality: Left;   TUBAL LIGATION       Vanessa Kick, MD 01/01/23 1004

## 2023-01-13 DIAGNOSIS — E559 Vitamin D deficiency, unspecified: Secondary | ICD-10-CM | POA: Diagnosis not present

## 2023-01-13 DIAGNOSIS — E782 Mixed hyperlipidemia: Secondary | ICD-10-CM | POA: Diagnosis not present

## 2023-01-13 DIAGNOSIS — G43719 Chronic migraine without aura, intractable, without status migrainosus: Secondary | ICD-10-CM | POA: Diagnosis not present

## 2023-01-13 DIAGNOSIS — I10 Essential (primary) hypertension: Secondary | ICD-10-CM | POA: Diagnosis not present

## 2023-01-13 DIAGNOSIS — R739 Hyperglycemia, unspecified: Secondary | ICD-10-CM | POA: Diagnosis not present

## 2023-01-14 ENCOUNTER — Encounter: Payer: Medicaid Other | Admitting: Internal Medicine

## 2023-01-14 LAB — CBC WITH DIFFERENTIAL/PLATELET
Basophils Absolute: 0.1 10*3/uL (ref 0.0–0.2)
Basos: 1 %
EOS (ABSOLUTE): 0.3 10*3/uL (ref 0.0–0.4)
Eos: 4 %
Hematocrit: 44.1 % (ref 34.0–46.6)
Hemoglobin: 15.1 g/dL (ref 11.1–15.9)
Immature Grans (Abs): 0 10*3/uL (ref 0.0–0.1)
Immature Granulocytes: 1 %
Lymphocytes Absolute: 1.9 10*3/uL (ref 0.7–3.1)
Lymphs: 22 %
MCH: 33 pg (ref 26.6–33.0)
MCHC: 34.2 g/dL (ref 31.5–35.7)
MCV: 97 fL (ref 79–97)
Monocytes Absolute: 0.5 10*3/uL (ref 0.1–0.9)
Monocytes: 6 %
Neutrophils Absolute: 5.8 10*3/uL (ref 1.4–7.0)
Neutrophils: 66 %
Platelets: 250 10*3/uL (ref 150–450)
RBC: 4.57 x10E6/uL (ref 3.77–5.28)
RDW: 12.7 % (ref 11.7–15.4)
WBC: 8.7 10*3/uL (ref 3.4–10.8)

## 2023-01-14 LAB — CMP14+EGFR
ALT: 18 IU/L (ref 0–32)
AST: 18 IU/L (ref 0–40)
Albumin/Globulin Ratio: 2 (ref 1.2–2.2)
Albumin: 4.4 g/dL (ref 3.9–4.9)
Alkaline Phosphatase: 89 IU/L (ref 44–121)
BUN/Creatinine Ratio: 13 (ref 9–23)
BUN: 10 mg/dL (ref 6–24)
Bilirubin Total: 0.4 mg/dL (ref 0.0–1.2)
CO2: 18 mmol/L — ABNORMAL LOW (ref 20–29)
Calcium: 9.3 mg/dL (ref 8.7–10.2)
Chloride: 106 mmol/L (ref 96–106)
Creatinine, Ser: 0.8 mg/dL (ref 0.57–1.00)
Globulin, Total: 2.2 g/dL (ref 1.5–4.5)
Glucose: 83 mg/dL (ref 70–99)
Potassium: 3.9 mmol/L (ref 3.5–5.2)
Sodium: 141 mmol/L (ref 134–144)
Total Protein: 6.6 g/dL (ref 6.0–8.5)
eGFR: 94 mL/min/{1.73_m2} (ref >59)

## 2023-01-14 LAB — HEMOGLOBIN A1C
Est. average glucose Bld gHb Est-mCnc: 105 mg/dL
Hgb A1c MFr Bld: 5.3 % (ref 4.8–5.6)

## 2023-01-14 LAB — TSH: TSH: 2.83 u[IU]/mL (ref 0.450–4.500)

## 2023-01-14 LAB — LIPID PANEL
Chol/HDL Ratio: 4.1 ratio (ref 0.0–4.4)
Cholesterol, Total: 189 mg/dL (ref 100–199)
HDL: 46 mg/dL (ref >39)
LDL Chol Calc (NIH): 103 mg/dL — ABNORMAL HIGH (ref 0–99)
Triglycerides: 234 mg/dL — ABNORMAL HIGH (ref 0–149)
VLDL Cholesterol Cal: 40 mg/dL (ref 5–40)

## 2023-01-14 LAB — VITAMIN D 25 HYDROXY (VIT D DEFICIENCY, FRACTURES): Vit D, 25-Hydroxy: 25.5 ng/mL — ABNORMAL LOW (ref 30.0–100.0)

## 2023-01-27 ENCOUNTER — Encounter: Payer: Medicaid Other | Admitting: Internal Medicine

## 2023-02-06 ENCOUNTER — Encounter: Payer: Self-pay | Admitting: Radiology

## 2023-02-25 ENCOUNTER — Encounter: Payer: Self-pay | Admitting: Internal Medicine

## 2023-02-25 ENCOUNTER — Ambulatory Visit (INDEPENDENT_AMBULATORY_CARE_PROVIDER_SITE_OTHER): Payer: Medicaid Other | Admitting: Internal Medicine

## 2023-02-25 VITALS — BP 138/88 | HR 90 | Ht 60.0 in | Wt 213.0 lb

## 2023-02-25 DIAGNOSIS — Z0001 Encounter for general adult medical examination with abnormal findings: Secondary | ICD-10-CM

## 2023-02-25 DIAGNOSIS — R432 Parageusia: Secondary | ICD-10-CM | POA: Diagnosis not present

## 2023-02-25 DIAGNOSIS — J4530 Mild persistent asthma, uncomplicated: Secondary | ICD-10-CM

## 2023-02-25 DIAGNOSIS — F331 Major depressive disorder, recurrent, moderate: Secondary | ICD-10-CM | POA: Diagnosis not present

## 2023-02-25 DIAGNOSIS — M25511 Pain in right shoulder: Secondary | ICD-10-CM

## 2023-02-25 DIAGNOSIS — I10 Essential (primary) hypertension: Secondary | ICD-10-CM | POA: Diagnosis not present

## 2023-02-25 MED ORDER — MELOXICAM 7.5 MG PO TABS: 7.5000 mg | ORAL_TABLET | Freq: Every day | ORAL | 0 refills | Status: DC

## 2023-02-25 NOTE — Assessment & Plan Note (Signed)
Likely due to recent viral URTI Reassured for now

## 2023-02-25 NOTE — Assessment & Plan Note (Signed)
Likely due to bursitis as she has local tenderness Mobic as needed for pain Advised to apply ice over the shoulder area

## 2023-02-25 NOTE — Assessment & Plan Note (Addendum)
Has tried low-carb diet and exercise regimen Has been on phentermine in the past, unable to lose weight further Not able to start GLP-1 agonist due to insurance coverage concern Had advised to think about bariatric surgery referral

## 2023-02-25 NOTE — Assessment & Plan Note (Signed)
Uncontrolled currently due to acute stressors at her home Would avoid changing regimen for now Was well-controlled with Wellbutrin 300 mg QD

## 2023-02-25 NOTE — Assessment & Plan Note (Addendum)
Well-controlled with Stiolto and albuterol as needed Has seen pulmonology

## 2023-02-25 NOTE — Progress Notes (Signed)
Established Patient Office Visit  Subjective:  Patient ID: Destiny Petty, female    DOB: [birth year]  Age: 42 y.o. MRN: [id number]  CC:  Chief Complaint  Patient presents with   Annual Exam    Patient states for the last two days she has been in a lot of pain, very aching, hurts to raise her arm up. Patient states since being sick in February her taste and smell is not back. She would like to be checked for diabetes    HPI Destiny Petty is a 42 y.o. female with past medical history of asthma, GERD, migraine, HLD, depression and morbid obesity who presents for annual physical.  She complains of right shoulder pain for the last 2 days.  Her pain is constant, worse with abduction and lying on right side.  Denies any recent injury.  Denies any numbness or tingling of the RUE.  She had viral URTI in 02/24.  She has loss of taste and smell sensation since then.  Denies any fever, chills, dyspnea or wheezing.  HTN: BP is well-controlled. Takes medications regularly. Patient denies headache, dizziness, chest pain, dyspnea or palpitations.  MDD: She takes Wellbutrin 300 mg daily.  She has had stress at home, has been dealing with her daughter with ADHD and her son is moving out.  Denies any SI or HI currently.    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 Scheduled for Cholecystectomy   GERD (gastroesophageal reflux  disease)    Hemorrhoids 02/10/2015   Hiatal hernia    Hyperlipidemia 04/09/2011   Hypertension    no meds   Injury of digital nerve of right little finger 08/23/2020   Laceration of blood vessel of right little finger 08/23/2020   Laceration of finger of left hand without damage to nail    left small finger   Menorrhagia with regular cycle 04/10/2016   Migraine 01/15/2016   Overview:  Overview:  IMO Load 2016 R1.3   Muscle spasm of back 03/13/2016   Numbness and tingling in left hand 08/19/2016   Obesity    Ovarian cyst 12/18/2016   Perianal wart 02/10/2015   Stress 02/14/2016   Weight loss counseling, encounter for 03/13/2016    Past Surgical History:  Procedure Laterality Date   ABLATION     CESAREAN SECTION     CESAREAN SECTION N/A    Phreesia 05/23/2020   CHOLECYSTECTOMY     COLONOSCOPY     DILATATION AND CURETTAGE/HYSTEROSCOPY WITH MINERVA N/A 08/31/2019   Procedure: DILATATION AND CURETTAGE (no specimen) /HYSTEROSCOPY WITH MINERVA ENDOMETRIAL ABLATION;  Surgeon: Jonnie Kind, MD;  Location: [facility];  Service: Gynecology;  Laterality: N/A;   ESOPHAGOGASTRODUODENOSCOPY ENDOSCOPY     HEMORRHOID SURGERY N/A 05/10/2022   Procedure: HEMORRHOIDECTOMY, EXTENSIVE;  Surgeon: Aviva Signs, MD;  Location: [facility];  Service: General;  Laterality: N/A;  TENDON EXPLORATION Left 08/10/2020   Procedure: LEFT SMALL FINER EXPLORATION, REPAIR OF TENDON, ARTERY, NERVE;  Surgeon: Leanora Cover, MD;  Location: [facility];  Service: Orthopedics;  Laterality: Left;   TUBAL LIGATION      Family History  Problem Relation Age of Onset   Diabetes Mother    Kidney failure Mother    Early death Mother 36       diabetes complications   Cancer Father 35       colon cancer   Depression Sister    Hypertension Sister    Anxiety disorder Sister    Other Sister        back problems   ADD / ADHD Daughter    Other Daughter        behavioral issues   ODD Daughter    Asthma Son    Cancer Maternal  Grandmother        breast   Seizures Maternal Grandmother    Lupus Other    Breast cancer Maternal Aunt     Social History   Socioeconomic History   Marital status: Legally Separated    Spouse name: Not on file   Number of children: 2   Years of education: 14   Highest education level: Not on file  Occupational History   Not on file  Tobacco Use   Smoking status: Every Day    Packs/day: 1.00    Years: 26.00    Additional pack years: 0.00    Total pack years: 26.00    Types: Cigarettes   Smokeless tobacco: Never   Tobacco comments:    1/4 ppd 05/30/21  Vaping Use   Vaping Use: Never used  Substance and Sexual Activity   Alcohol use: Yes    Comment: occ   Drug use: No   Sexual activity: Not Currently    Birth control/protection: Surgical    Comment: tubal  Other Topics Concern   Not on file  Social History Narrative   Legally separated right now   2 children: son 22 and daughter 70 lives with her      2 dogs: macho, mary jane   2 birds:    1 cats: boghgie       Enjoys: spending time with kids, outside things      Diet: eat all food groups    Caffeine: coffee and soda daily   Water: 4 cups daily      Wears seat belt   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   No weapons        Social Determinants of Health   Financial Resource Strain: Low Risk  (06/01/2020)   Overall Financial Resource Strain (CARDIA)    Difficulty of Paying Living Expenses: Not hard at all  Food Insecurity: No Food Insecurity (06/01/2020)   Hunger Vital Sign    Worried About Running Out of Food in the Last Year: Never true    Ran Out of Food in the Last Year: Never true  Transportation Needs: No Transportation Needs (06/01/2020)   PRAPARE - Hydrologist (Medical): No    Lack of Transportation (Non-Medical): No  Physical Activity: Insufficiently Active (06/01/2020)   Exercise Vital Sign    Days of Exercise per Week: 2 days    Minutes of  Exercise per Session: 30 min  Stress: No Stress Concern Present (06/01/2020)   [place]  Feeling of Stress : Not at all  Social Connections: Moderately Isolated (06/01/2020)   Social Connection and Isolation Panel [NHANES]    Frequency of Communication with Friends and Family: More than three times a week    Frequency of Social Gatherings with Friends and Family: More than three times a week    Attends Religious Services: More than 4 times per year    Active Member of [employer] or Organizations: No    Attends Archivist Meetings: Never    Marital Status: Separated  Intimate Partner Violence: Not At Risk (06/01/2020)   Humiliation, Afraid, Rape, and Kick questionnaire    Fear of Current or Ex-Partner: No    Emotionally Abused: No    Physically Abused: No    Sexually Abused: No    Outpatient Medications Prior to Visit  Medication Sig Dispense Refill   albuterol (VENTOLIN HFA) 108 (90 Base) MCG/ACT inhaler Inhale 1-2 puffs into the lungs every 6 (six) hours as needed for wheezing or shortness of breath. 18 g 2   amLODipine (NORVASC) 5 MG tablet Take 1 tablet (5 mg total) by mouth daily. 90 tablet 0   buPROPion (WELLBUTRIN XL) 300 MG 24 hr tablet Take 1 tablet (300 mg total) by mouth daily. 90 tablet 1   cetirizine (ZYRTEC) 10 MG tablet Take 1 tablet (10 mg total) by mouth daily. 30 tablet 0   famotidine (PEPCID) 20 MG tablet One after supper 90 tablet 3   fluticasone (FLONASE) 50 MCG/ACT nasal spray Place 2 sprays into both nostrils daily. 16 g 0   hydrochlorothiazide (MICROZIDE) 12.5 MG capsule Take 1 capsule (12.5 mg total) by mouth daily. 90 capsule 0   levocetirizine (XYZAL) 5 MG tablet Take 1 tablet (5 mg total) by mouth every evening. 90 tablet 0   rizatriptan (MAXALT-MLT) 10 MG disintegrating tablet TAKE 1 TABLET BY MOUTH ONCE AS NEEDED FOR MIGRAINE. MAY REPEAT IN 2 HOURS IF NEEDED. 10 tablet 2   Tiotropium  Bromide-Olodaterol 2.5-2.5 MCG/ACT AERS Inhale 2 puffs into the lungs daily. 4 g 5   azithromycin (ZITHROMAX) 250 MG tablet Take 1 tablet (250 mg total) by mouth daily. Take first 2 tablets together, then 1 every day until finished. 6 tablet 0   No facility-administered medications prior to visit.    Allergies  Allergen Reactions   Bee Venom Shortness Of Breath   Latex Itching   Lisinopril Cough    ROS Review of Systems  Constitutional:  Positive for fatigue. Negative for chills and fever.  HENT:  Negative for congestion, sinus pressure and sore throat.   Eyes:  Negative for pain and discharge.  Respiratory:  Negative for cough and shortness of breath.   Cardiovascular:  Negative for chest pain and palpitations.  Gastrointestinal:  Positive for nausea. Negative for abdominal pain, diarrhea and vomiting.  Endocrine: Negative for polydipsia and polyuria.  Genitourinary:  Negative for dysuria and hematuria.  Musculoskeletal:  Positive for arthralgias. Negative for neck pain and neck stiffness.  Skin:  Negative for rash.  Neurological:  Positive for headaches. Negative for dizziness and weakness.  Psychiatric/Behavioral:  Positive for dysphoric mood. Negative for agitation and behavioral problems. The patient is nervous/anxious.       Objective:    Physical Exam Vitals reviewed.  Constitutional:      General: She is not in acute distress.    Appearance: She is obese. She is not diaphoretic.  HENT:     Head: Normocephalic and atraumatic.  Nose: No congestion.     Mouth/Throat:     Mouth: Mucous membranes are moist.     Pharynx: No posterior oropharyngeal erythema.  Eyes:     General: No scleral icterus.    Extraocular Movements: Extraocular movements intact.  Cardiovascular:     Rate and Rhythm: Normal rate and regular rhythm.     Pulses: Normal pulses.     Heart sounds: Normal heart sounds. No murmur heard. Pulmonary:     Breath sounds: No wheezing or rales.   Abdominal:     Palpations: Abdomen is soft.     Tenderness: There is no abdominal tenderness.  Musculoskeletal:     Right shoulder: Tenderness present. Decreased range of motion.     Cervical back: Neck supple. No tenderness.     Right lower leg: No edema.     Left lower leg: No edema.  Skin:    General: Skin is warm.     Findings: No rash.  Neurological:     General: No focal deficit present.     Mental Status: She is alert and oriented to person, place, and time.  Psychiatric:        Mood and Affect: Mood normal.        Behavior: Behavior normal.     BP 138/88 (BP Location: Left Arm, Cuff Size: Normal)   Pulse 90   Ht 5' (1.524 m)   Wt 213 lb (96.6 kg)   SpO2 94%   BMI 41.60 kg/m  Wt Readings from Last 3 Encounters:  02/25/23 213 lb (96.6 kg)  09/23/22 231 lb 12.8 oz (105.1 kg)  05/30/22 230 lb (104.3 kg)    Lab Results  Component Value Date   TSH 2.830 01/13/2023   Lab Results  Component Value Date   WBC 8.7 01/13/2023   HGB 15.1 01/13/2023   HCT 44.1 01/13/2023   MCV 97 01/13/2023   PLT 250 01/13/2023   Lab Results  Component Value Date   NA 141 01/13/2023   K 3.9 01/13/2023   CO2 18 (L) 01/13/2023   GLUCOSE 83 01/13/2023   BUN 10 01/13/2023   CREATININE 0.80 01/13/2023   BILITOT 0.4 01/13/2023   ALKPHOS 89 01/13/2023   AST 18 01/13/2023   ALT 18 01/13/2023   PROT 6.6 01/13/2023   ALBUMIN 4.4 01/13/2023   CALCIUM 9.3 01/13/2023   ANIONGAP 8 05/08/2022   EGFR 94 01/13/2023   Lab Results  Component Value Date   CHOL 189 01/13/2023   Lab Results  Component Value Date   HDL 46 01/13/2023   Lab Results  Component Value Date   LDLCALC 103 (H) 01/13/2023   Lab Results  Component Value Date   TRIG 234 (H) 01/13/2023   Lab Results  Component Value Date   CHOLHDL 4.1 01/13/2023   Lab Results  Component Value Date   HGBA1C 5.3 01/13/2023      Assessment & Plan:   Problem List Items Addressed This Visit       Cardiovascular and  Mediastinum   Essential hypertension    BP Readings from Last 1 Encounters:  02/25/23 138/88  Usually well-controlled with amlodipine 5 mg daily and HCTZ 12.5 mg QD Counseled for compliance with the medications Advised DASH diet and moderate exercise/walking, at least 150 mins/week        Respiratory   Asthma, mild persistent    Well-controlled with Stiolto and albuterol as needed Has seen pulmonology        Other  Morbid obesity due to excess calories ([place])    Has tried low-carb diet and exercise regimen Has been on phentermine in the past, unable to lose weight further Not able to start GLP-1 agonist due to insurance coverage concern Had advised to think about bariatric surgery referral      Encounter for general adult medical examination with abnormal findings - Primary    Physical exam as documented. Fasting blood tests reviewed today. PAP smear with female provider/Ob.Gyn.      Recurrent major depressive disorder ([place])    Uncontrolled currently due to acute stressors at her home Would avoid changing regimen for now Was well-controlled with Wellbutrin 300 mg QD      Loss of taste    Likely due to recent viral URTI Reassured for now      Acute pain of right shoulder    Likely due to bursitis as she has local tenderness Mobic as needed for pain Advised to apply ice over the shoulder area      Relevant Medications   meloxicam (MOBIC) 7.5 MG tablet    Meds ordered this encounter  Medications   meloxicam (MOBIC) 7.5 MG tablet    Sig: Take 1 tablet (7.5 mg total) by mouth daily.    Dispense:  30 tablet    Refill:  0    Follow-up: Return in about 3 months (around 05/28/2023) for HTN and MDD.    Lindell Spar, MD

## 2023-02-25 NOTE — Patient Instructions (Addendum)
Please start taking Vitamin D 2000 IU once daily.  Please continue to take medications as prescribed.  Please continue to follow low salt diet and perform moderate exercise/walking at least 150 mins/week.

## 2023-02-25 NOTE — Assessment & Plan Note (Addendum)
Physical exam as documented. Fasting blood tests reviewed today. PAP smear with female provider/Ob.Gyn.

## 2023-02-25 NOTE — Assessment & Plan Note (Signed)
BP Readings from Last 1 Encounters:  02/25/23 138/88   Usually well-controlled with amlodipine 5 mg daily and HCTZ 12.5 mg QD Counseled for compliance with the medications Advised DASH diet and moderate exercise/walking, at least 150 mins/week

## 2023-03-21 ENCOUNTER — Ambulatory Visit: Payer: Medicaid Other | Admitting: Family Medicine

## 2023-03-21 ENCOUNTER — Encounter: Payer: Self-pay | Admitting: Family Medicine

## 2023-03-21 ENCOUNTER — Other Ambulatory Visit (HOSPITAL_COMMUNITY)
Admission: RE | Admit: 2023-03-21 | Discharge: 2023-03-21 | Disposition: A | Discharge status: Home / Self Care | Payer: Medicaid Other | Source: Ambulatory Visit | Attending: Family Medicine | Admitting: Family Medicine

## 2023-03-21 VITALS — BP 122/84 | HR 99 | Ht 60.0 in | Wt 213.0 lb

## 2023-03-21 DIAGNOSIS — G43719 Chronic migraine without aura, intractable, without status migrainosus: Secondary | ICD-10-CM | POA: Diagnosis not present

## 2023-03-21 DIAGNOSIS — Z124 Encounter for screening for malignant neoplasm of cervix: Secondary | ICD-10-CM | POA: Insufficient documentation

## 2023-03-21 DIAGNOSIS — J4530 Mild persistent asthma, uncomplicated: Secondary | ICD-10-CM

## 2023-03-21 MED ORDER — CETIRIZINE HCL 10 MG PO TABS: 10.0000 mg | ORAL_TABLET | Freq: Every day | ORAL | 0 refills | Status: DC

## 2023-03-21 MED ORDER — FLUTICASONE PROPIONATE 50 MCG/ACT NA SUSP: 2.0000 | Freq: Every day | NASAL | 0 refills | Status: DC

## 2023-03-21 MED ORDER — TIOTROPIUM BROMIDE-OLODATEROL 2.5-2.5 MCG/ACT IN AERS: 2.0000 | INHALATION_SPRAY | Freq: Every day | RESPIRATORY_TRACT | 5 refills | Status: AC

## 2023-03-21 MED ORDER — RIZATRIPTAN BENZOATE 10 MG PO TBDP: ORAL_TABLET | ORAL | 2 refills | Status: DC

## 2023-03-21 NOTE — Progress Notes (Signed)
Patient Office Visit   Subjective   Patient ID: Destiny Petty, female    DOB: 1981-05-17  Age: 42 y.o. MRN: [id number]  CC:  Chief Complaint  Patient presents with   Annual Exam    HPI Destiny Petty 42 year old female, presents to the clinic for pap smear. She  has a past medical history of Allergy, Anal condyloma (03/19/2016), Anxiety, Arthritis (12/04/2016), Asthma, Back pain with right-sided sciatica (06/18/2016), Bipolar disorder (01/15/2016), Body mass index 36.0-36.9, adult (04/10/2016), Body mass index 37.0-37.9, adult (03/13/2016), Depression, Depressive disorder, not elsewhere classified (03/27/2007), Dysmenorrhea (04/10/2016), Family history of adverse reaction to anesthesia, Gallstones (01/29/2016), GERD (gastroesophageal reflux disease), Hemorrhoids (02/10/2015), Hiatal hernia, Hyperlipidemia (04/09/2011), Hypertension, Injury of digital nerve of right little finger (08/23/2020), Laceration of blood vessel of right little finger (08/23/2020), Laceration of finger of left hand without damage to nail, Menorrhagia with regular cycle (04/10/2016), Migraine (01/15/2016), Muscle spasm of back (03/13/2016), Numbness and tingling in left hand (08/19/2016), Obesity, Ovarian cyst (12/18/2016), Perianal wart (02/10/2015), Stress (02/14/2016), and Weight loss counseling, encounter for (03/13/2016).For the details of today's visit, please refer to assessment and plan.   HPI    Outpatient Encounter Medications as of 03/21/2023  Medication Sig   albuterol (VENTOLIN HFA) 108 (90 Base) MCG/ACT inhaler Inhale 1-2 puffs into the lungs every 6 (six) hours as needed for wheezing or shortness of breath.   amLODipine (NORVASC) 5 MG tablet Take 1 tablet (5 mg total) by mouth daily.   buPROPion (WELLBUTRIN XL) 300 MG 24 hr tablet Take 1 tablet (300 mg total) by mouth daily.   cetirizine (ZYRTEC) 10 MG tablet Take 1 tablet (10 mg total) by mouth daily.   famotidine (PEPCID) 20 MG tablet One after supper    fluticasone (FLONASE) 50 MCG/ACT nasal spray Place 2 sprays into both nostrils daily.   hydrochlorothiazide (MICROZIDE) 12.5 MG capsule Take 1 capsule (12.5 mg total) by mouth daily.   levocetirizine (XYZAL) 5 MG tablet Take 1 tablet (5 mg total) by mouth every evening.   meloxicam (MOBIC) 7.5 MG tablet Take 1 tablet (7.5 mg total) by mouth daily.   rizatriptan (MAXALT-MLT) 10 MG disintegrating tablet TAKE 1 TABLET BY MOUTH ONCE AS NEEDED FOR MIGRAINE. MAY REPEAT IN 2 HOURS IF NEEDED.   Tiotropium Bromide-Olodaterol 2.5-2.5 MCG/ACT AERS Inhale 2 puffs into the lungs daily.   traMADol (ULTRAM) 50 MG tablet Take by mouth.   No facility-administered encounter medications on file as of 03/21/2023.    Past Surgical History:  Procedure Laterality Date   ABLATION     CESAREAN SECTION     CESAREAN SECTION N/A    Phreesia 05/23/2020   CHOLECYSTECTOMY     COLONOSCOPY     DILATATION AND CURETTAGE/HYSTEROSCOPY WITH MINERVA N/A 08/31/2019   Procedure: DILATATION AND CURETTAGE (no specimen) /HYSTEROSCOPY WITH MINERVA ENDOMETRIAL ABLATION;  Surgeon: Tilda Burrow, MD;  Location: [facility];  Service: Gynecology;  Laterality: N/A;   ESOPHAGOGASTRODUODENOSCOPY ENDOSCOPY     HEMORRHOID SURGERY N/A 05/10/2022   Procedure: HEMORRHOIDECTOMY, EXTENSIVE;  Surgeon: Franky Macho, MD;  Location: [facility];  Service: General;  Laterality: N/A;   TENDON EXPLORATION Left 08/10/2020   Procedure: LEFT SMALL FINER EXPLORATION, REPAIR OF TENDON, ARTERY, NERVE;  Surgeon: Betha Loa, MD;  Location: [facility];  Service: Orthopedics;  Laterality: Left;   TUBAL LIGATION      Review of Systems  Constitutional:  Negative for chills and fever.  HENT:  Negative for ear pain.  Eyes:  Negative for blurred vision.  Respiratory:  Negative for cough.   Cardiovascular:  Negative for chest pain.  Gastrointestinal:  Negative for abdominal pain and vomiting.  Genitourinary:  Negative for dysuria, flank pain, frequency,  hematuria and urgency.  Skin:  Negative for itching and rash.  Neurological:  Negative for dizziness and headaches.      Objective    BP 122/84   Pulse 99   Ht 5' (1.524 m)   Wt 213 lb (96.6 kg)   SpO2 94%   BMI 41.60 kg/m   Physical Exam Vitals reviewed.  Constitutional:      General: She is not in acute distress.    Appearance: Normal appearance. She is not ill-appearing, toxic-appearing or diaphoretic.  HENT:     Head: Normocephalic.  Eyes:     General:        Right eye: No discharge.        Left eye: No discharge.     Conjunctiva/sclera: Conjunctivae normal.  Cardiovascular:     Rate and Rhythm: Normal rate.     Pulses: Normal pulses.     Heart sounds: Normal heart sounds.  Pulmonary:     Effort: Pulmonary effort is normal. No respiratory distress.     Breath sounds: Normal breath sounds.  Genitourinary:    General: Normal vulva.     Exam position: Lithotomy position.     Pubic Area: No rash or pubic lice.      Labia:        Right: No rash, tenderness, lesion or injury.        Left: No rash, tenderness, lesion or injury.      Urethra: No urethral pain, urethral swelling or urethral lesion.  Musculoskeletal:        General: Normal range of motion.     Cervical back: Normal range of motion.  Skin:    General: Skin is warm and dry.     Capillary Refill: Capillary refill takes less than 2 seconds.  Neurological:     General: No focal deficit present.     Mental Status: She is alert and oriented to person, place, and time.     Coordination: Coordination normal.     Gait: Gait normal.  Psychiatric:        Mood and Affect: Mood normal.        Behavior: Behavior normal.        Thought Content: Thought content normal.        Judgment: Judgment normal.       Assessment & Plan:  Screening for cervical cancer  Encounter for Papanicolaou smear of cervix Assessment & Plan: Pap smear done, Patient tolerated procedure well. Informed patient I will keep them  updated on results. Right and left breasts appear normal, no suspicious masses, no skin or nipple changes or axillary nodes.      No follow-ups on file.   Cruzita Lederer Newman Nip, FNP

## 2023-03-21 NOTE — Patient Instructions (Signed)
It was pleasure meeting with you today. Follow up with your primary health provider if any health concerns arises.   

## 2023-03-21 NOTE — Assessment & Plan Note (Signed)
Pap smear done, Patient tolerated procedure well. Informed patient I will keep them updated on results. Right and left breasts appear normal, no suspicious masses, no skin or nipple changes or axillary nodes. 

## 2023-03-26 LAB — CYTOLOGY - PAP

## 2023-03-28 ENCOUNTER — Other Ambulatory Visit: Payer: Self-pay | Admitting: Family Medicine

## 2023-03-28 DIAGNOSIS — R87612 Low grade squamous intraepithelial lesion on cytologic smear of cervix (LGSIL): Secondary | ICD-10-CM

## 2023-04-30 ENCOUNTER — Other Ambulatory Visit (HOSPITAL_COMMUNITY)
Admission: RE | Admit: 2023-04-30 | Discharge: 2023-04-30 | Disposition: A | Discharge status: Home / Self Care | Payer: Medicaid Other | Source: Ambulatory Visit | Attending: Obstetrics & Gynecology | Admitting: Obstetrics & Gynecology

## 2023-04-30 ENCOUNTER — Encounter: Payer: Self-pay | Admitting: Obstetrics & Gynecology

## 2023-04-30 ENCOUNTER — Ambulatory Visit (INDEPENDENT_AMBULATORY_CARE_PROVIDER_SITE_OTHER): Payer: Medicaid Other | Admitting: Obstetrics & Gynecology

## 2023-04-30 VITALS — BP 122/85 | HR 89 | Ht 60.0 in | Wt 221.2 lb

## 2023-04-30 DIAGNOSIS — N870 Mild cervical dysplasia: Secondary | ICD-10-CM | POA: Diagnosis not present

## 2023-04-30 DIAGNOSIS — Z3202 Encounter for pregnancy test, result negative: Secondary | ICD-10-CM

## 2023-04-30 DIAGNOSIS — Z01812 Encounter for preprocedural laboratory examination: Secondary | ICD-10-CM

## 2023-04-30 DIAGNOSIS — R87612 Low grade squamous intraepithelial lesion on cytologic smear of cervix (LGSIL): Secondary | ICD-10-CM | POA: Diagnosis not present

## 2023-04-30 LAB — POCT URINE PREGNANCY: Preg Test, Ur: NEGATIVE

## 2023-04-30 NOTE — Progress Notes (Signed)
Patient name: Destiny Petty MRN [id number]  Date of birth: 08-27-1981 Chief Complaint:   Colposcopy  History of Present Illness:   Destiny Petty is a 42 y.o. G32P2002 female being seen today for cervical dysplasia management.  (See below chart)  Recent pap LSIL  Denies bleeding, discharge, itching or irritation.  On occasion will note mild cramping, but she does not have a period- s/p endometrial ablation.    Smoker:  Yes.   New sexual partner:  No.   Prior cytology:  Date Result Procedure  03/21/2023 LSIL     NILM w/ HRHPV negative None   No LMP recorded. Patient has had an ablation.     02/25/2023    8:19 AM 09/23/2022    9:13 AM 04/12/2022    9:34 AM 02/15/2022    9:05 AM 11/22/2021    9:06 AM  Depression screen PHQ 2/9  Decreased Interest 2 0 0 0 0  Down, Depressed, Hopeless 2 0 0 0 1  PHQ - 2 Score 4 0 0 0 1  Altered sleeping 3      Tired, decreased energy 3      Change in appetite 3      Feeling bad or failure about yourself  1      Trouble concentrating 3      Moving slowly or fidgety/restless 0      Suicidal thoughts 0      PHQ-9 Score 17      Difficult doing work/chores Very difficult         Review of Systems:   Pertinent items are noted in HPI Denies fever/chills, dizziness, headaches, visual disturbances, fatigue, shortness of breath, chest pain, abdominal pain, vomiting, no problems with periods, bowel movements, urination, or intercourse unless otherwise stated above.  Pertinent History Reviewed:  Reviewed past medical,surgical, social, obstetrical and family history.  Reviewed problem list, medications and allergies. Physical Assessment:   Vitals:   04/30/23 0915  BP: 122/85  Pulse: 89  Weight: 221 lb 3.2 oz (100.3 kg)  Height: 5' (1.524 m)  Body mass index is 43.2 kg/m.       Physical Examination:   General appearance: alert, well appearing, and in no distress  Psych: mood appropriate, normal affect  Skin: warm & dry    Cardiovascular: normal heart rate noted  Respiratory: normal respiratory effort, no distress  Abdomen: soft, non-tender   Pelvic: VULVA: normal appearing vulva with no masses, tenderness or lesions, VAGINA: normal appearing vagina with normal color and discharge, no lesions, CERVIX: see colposcopy section  Extremities: no edema   Chaperone: Faith Rogue     Colposcopy Procedure Note  Indications: LSIL    Procedure Details  The risks and benefits of the procedure and Written informed consent obtained.  Speculum placed in vagina and excellent visualization of cervix achieved, cervix swabbed x 3 with acetic acid solution.  Findings: Adequate colposcopy is noted today.  TMZ zone present  Cervix: Acetowhite changes noted, with slight irregular abnormal vascularity at 12:00; ECC and cervical biopsies obtained.    Monsel's applied.  Adequate hemostasis noted  Specimens: ECC, cervical biopsy  Complications: none.  Colposcopic Impression: CIN-1   Plan(Based on 2019 ASCCP recommendations)  -Discussed HPV- reviewed incidence and its potential to cause condylomas to dysplasia to cervical cancer -Reviewed degree of abnormal pap smears  -Discussed ASCCP guidelines and current recommendations for colposcopy -As above, inform consent obtained and procedure completed -biopsies obtained, further management pending results -Questions and  concerns were addressed  Destiny Hidalgo, DO Attending Obstetrician & Gynecologist, [facility] for [facility], [facility]

## 2023-05-01 LAB — SURGICAL PATHOLOGY

## 2023-05-29 ENCOUNTER — Ambulatory Visit: Payer: Medicaid Other | Admitting: Internal Medicine

## 2023-05-29 ENCOUNTER — Encounter: Payer: Self-pay | Admitting: Internal Medicine

## 2023-05-29 DIAGNOSIS — F331 Major depressive disorder, recurrent, moderate: Secondary | ICD-10-CM | POA: Diagnosis not present

## 2023-05-29 DIAGNOSIS — J3089 Other allergic rhinitis: Secondary | ICD-10-CM

## 2023-05-29 DIAGNOSIS — I10 Essential (primary) hypertension: Secondary | ICD-10-CM

## 2023-05-29 DIAGNOSIS — J4530 Mild persistent asthma, uncomplicated: Secondary | ICD-10-CM | POA: Diagnosis not present

## 2023-05-29 DIAGNOSIS — E782 Mixed hyperlipidemia: Secondary | ICD-10-CM | POA: Diagnosis not present

## 2023-05-29 MED ORDER — VENTOLIN HFA 108 (90 BASE) MCG/ACT IN AERS
1.0000 | INHALATION_SPRAY | Freq: Four times a day (QID) | RESPIRATORY_TRACT | 2 refills | Status: DC | PRN
Start: 2023-05-29 — End: 2023-07-16

## 2023-05-29 MED ORDER — PHENTERMINE HCL 37.5 MG PO TABS
37.5000 mg | ORAL_TABLET | Freq: Every day | ORAL | 2 refills | Status: DC
Start: 2023-05-29 — End: 2023-08-29

## 2023-05-29 NOTE — Assessment & Plan Note (Signed)
Last lipid profile reviewed DC Fenofibrate to reduce pill burden Check lipid profile 

## 2023-05-29 NOTE — Progress Notes (Signed)
Established Patient Office Visit  Subjective:  Patient ID: Destiny Petty, female    DOB: 02-14-1981  Age: 42 y.o. MRN: [id number]  CC:  Chief Complaint  Patient presents with   Hypertension    Three month follow up    Depression    Three month follow up    Obesity    Patient is concerned about her weight gain     HPI Destiny Petty is a 42 y.o. female with past medical history of asthma, GERD, migraine, HLD, depression and morbid obesity who presents for f/u of her chronic medical conditions.  HTN: Her BP wnl today.  She has been taking amlodipine and HCTZ regularly now. She denies any chest pain or palpitations.  Migraine: She complains of intermittent episodes of headache, which is sharp, nonradiating, and is associated with severe nausea.  She had Maxalt as needed, which improves her headache  Asthma: She has run out of her albuterol inhaler and requests refill.  She complains of intermittent dyspnea, worse with exertion.  She uses Stiolto as maintenance inhaler.  Denies any fever or chills.  She takes Xyzal for allergies.  Obesity, MDD: She complains of chronic fatigue and weight gain.  She is on Wellbutrin, which was started to help her quit smoking.  She still smokes, but reports that she has not been smoking on a daily basis.  Denies any SI or HI currently.  She attributes her mood symptoms to weight gain.  She had responded excellently to phentermine in the past.  HLD: She takes fenofibrate for hyper TG.  Her last lipid profile shows TG WNL range.     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 Scheduled for Cholecystectomy   GERD (gastroesophageal reflux disease)    Hemorrhoids 02/10/2015   Hiatal hernia    Hyperlipidemia 04/09/2011   Hypertension    no meds   Injury of digital nerve of right little finger 08/23/2020   Laceration of blood vessel of right little finger 08/23/2020   Laceration of finger of left hand without damage to nail    left small finger   Menorrhagia with regular cycle 04/10/2016   Migraine 01/15/2016   Overview:  Overview:  IMO Load 2016 R1.3   Muscle spasm of back 03/13/2016   Numbness and tingling in left hand 08/19/2016   Obesity    Ovarian cyst 12/18/2016   Perianal wart 02/10/2015   Stress 02/14/2016   Weight loss counseling, encounter for 03/13/2016    Past Surgical History:  Procedure Laterality Date   ABLATION     CESAREAN SECTION     CESAREAN SECTION N/A    Phreesia 05/23/2020   CHOLECYSTECTOMY     COLONOSCOPY     DILATATION AND CURETTAGE/HYSTEROSCOPY WITH MINERVA N/A 08/31/2019   Procedure: DILATATION AND CURETTAGE (no specimen) /HYSTEROSCOPY WITH MINERVA ENDOMETRIAL ABLATION;  Surgeon: Emelda Fear,  Cyndi Lennert, MD;  Location: [facility];  Service: Gynecology;  Laterality: N/A;   ESOPHAGOGASTRODUODENOSCOPY ENDOSCOPY     HEMORRHOID SURGERY N/A 05/10/2022   Procedure: HEMORRHOIDECTOMY, EXTENSIVE;  Surgeon: Franky Macho, MD;  Location: [facility];  Service: General;  Laterality: N/A;   TENDON EXPLORATION Left 08/10/2020   Procedure: LEFT SMALL FINER EXPLORATION, REPAIR OF TENDON, ARTERY, NERVE;  Surgeon: Betha Loa, MD;  Location: [facility];  Service: Orthopedics;  Laterality: Left;   TUBAL LIGATION      Family History  Problem Relation Age of Onset   Diabetes Mother    Kidney failure Mother    Early death Mother 35       diabetes complications    Cancer Father 36       colon cancer   Depression Sister    Hypertension Sister    Anxiety disorder Sister    Other Sister        back problems   ADD / ADHD Daughter    Other Daughter        behavioral issues   ODD Daughter    Asthma Son    Cancer Maternal Grandmother        breast   Seizures Maternal Grandmother    Lupus Other    Breast cancer Maternal Aunt     Social History   Socioeconomic History   Marital status: Legally Separated    Spouse name: Not on file   Number of children: 2   Years of education: 14   Highest education level: Not on file  Occupational History   Not on file  Tobacco Use   Smoking status: Every Day    Packs/day: 1.00    Years: 26.00    Additional pack years: 0.00    Total pack years: 26.00    Types: Cigarettes   Smokeless tobacco: Never   Tobacco comments:    1/4 ppd 05/30/21  Vaping Use   Vaping Use: Never used  Substance and Sexual Activity   Alcohol use: Yes    Comment: occ   Drug use: No   Sexual activity: Not Currently    Birth control/protection: Surgical    Comment: tubal  Other Topics Concern   Not on file  Social History Narrative   Legally separated right now   2 children: son 46 and daughter 57 lives with her      2 dogs: macho, mary jane   2 birds:    1 cats: boghgie       Enjoys: spending time with kids, outside things      Diet: eat all food groups    Caffeine: coffee and soda daily   Water: 4 cups daily      Wears seat belt   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   No weapons        Social Determinants of Health   Financial Resource Strain: Low Risk  (06/01/2020)   Overall Financial Resource Strain (CARDIA)    Difficulty of Paying Living Expenses: Not hard at all  Food Insecurity: No Food Insecurity (06/01/2020)   Hunger Vital Sign    Worried About Running Out of Food in the Last Year: Never true    Ran Out of Food in the Last Year: Never true  Transportation Needs: No Transportation  Needs (06/01/2020)   PRAPARE - Administrator, Civil Service (Medical): No    Lack of Transportation (Non-Medical): No  Physical Activity:  Insufficiently Active (06/01/2020)   Exercise Vital Sign    Days of Exercise per Week: 2 days    Minutes of Exercise per Session: 30 min  Stress: No Stress Concern Present (06/01/2020)   [employer] of Occupational Health - Occupational Stress Questionnaire    Feeling of Stress : Not at all  Social Connections: Moderately Isolated (06/01/2020)   Social Connection and Isolation Panel [NHANES]    Frequency of Communication with Friends and Family: More than three times a week    Frequency of Social Gatherings with Friends and Family: More than three times a week    Attends Religious Services: More than 4 times per year    Active Member of [employer] or Organizations: No    Attends Banker Meetings: Never    Marital Status: Separated  Intimate Partner Violence: Not At Risk (06/01/2020)   Humiliation, Afraid, Rape, and Kick questionnaire    Fear of Current or Ex-Partner: No    Emotionally Abused: No    Physically Abused: No    Sexually Abused: No    Outpatient Medications Prior to Visit  Medication Sig Dispense Refill   amLODipine (NORVASC) 5 MG tablet Take 1 tablet (5 mg total) by mouth daily. 90 tablet 0   buPROPion (WELLBUTRIN XL) 300 MG 24 hr tablet Take 1 tablet (300 mg total) by mouth daily. 90 tablet 1   cetirizine (ZYRTEC) 10 MG tablet Take 1 tablet (10 mg total) by mouth daily. 30 tablet 0   famotidine (PEPCID) 20 MG tablet One after supper 90 tablet 3   fluticasone (FLONASE) 50 MCG/ACT nasal spray Place 2 sprays into both nostrils daily. 16 g 0   hydrochlorothiazide (MICROZIDE) 12.5 MG capsule Take 1 capsule (12.5 mg total) by mouth daily. 90 capsule 0   levocetirizine (XYZAL) 5 MG tablet Take 1 tablet (5 mg total) by mouth every evening. (Patient not taking: Reported on 04/30/2023) 90 tablet 0   meloxicam (MOBIC) 7.5  MG tablet Take 1 tablet (7.5 mg total) by mouth daily. (Patient not taking: Reported on 04/30/2023) 30 tablet 0   rizatriptan (MAXALT-MLT) 10 MG disintegrating tablet TAKE 1 TABLET BY MOUTH ONCE AS NEEDED FOR MIGRAINE. MAY REPEAT IN 2 HOURS IF NEEDED. 10 tablet 2   Tiotropium Bromide-Olodaterol 2.5-2.5 MCG/ACT AERS Inhale 2 puffs into the lungs daily. (Patient not taking: Reported on 04/30/2023) 4 g 5   traMADol (ULTRAM) 50 MG tablet Take by mouth. (Patient not taking: Reported on 04/30/2023)     albuterol (VENTOLIN HFA) 108 (90 Base) MCG/ACT inhaler Inhale 1-2 puffs into the lungs every 6 (six) hours as needed for wheezing or shortness of breath. 18 g 2   No facility-administered medications prior to visit.    Allergies  Allergen Reactions   Bee Venom Shortness Of Breath   Latex Itching   Lisinopril Cough    ROS Review of Systems  Constitutional:  Positive for fatigue. Negative for chills and fever.  HENT:  Negative for congestion, sinus pressure and sore throat.   Eyes:  Negative for pain and discharge.  Respiratory:  Positive for shortness of breath. Negative for cough.   Cardiovascular:  Negative for chest pain and palpitations.  Gastrointestinal:  Positive for nausea. Negative for abdominal pain, diarrhea and vomiting.  Endocrine: Negative for polydipsia and polyuria.  Genitourinary:  Negative for dysuria and hematuria.  Musculoskeletal:  Negative for neck pain and neck stiffness.  Skin:  Negative for rash.  Neurological:  Positive for headaches. Negative for dizziness  and weakness.  Psychiatric/Behavioral:  Negative for agitation and behavioral problems. The patient is nervous/anxious.       Objective:    Physical Exam Vitals reviewed.  Constitutional:      General: She is not in acute distress.    Appearance: She is obese. She is not diaphoretic.  HENT:     Head: Normocephalic and atraumatic.     Nose: No congestion.     Mouth/Throat:     Mouth: Mucous membranes are  moist.     Pharynx: No posterior oropharyngeal erythema.  Eyes:     General: No scleral icterus.    Extraocular Movements: Extraocular movements intact.  Cardiovascular:     Rate and Rhythm: Normal rate and regular rhythm.     Pulses: Normal pulses.     Heart sounds: Normal heart sounds. No murmur heard. Pulmonary:     Breath sounds: No wheezing or rales.  Musculoskeletal:     Cervical back: Neck supple. No tenderness.     Right lower leg: No edema.     Left lower leg: No edema.  Skin:    General: Skin is warm.     Findings: No rash.  Neurological:     General: No focal deficit present.     Mental Status: She is alert and oriented to person, place, and time.  Psychiatric:        Mood and Affect: Mood normal.        Behavior: Behavior normal.     BP 132/88 (BP Location: Right Arm, Patient Position: Sitting, Cuff Size: Large)   Pulse 98   Ht 5' (1.524 m)   Wt 220 lb 12.8 oz (100.2 kg)   SpO2 98%   BMI 43.12 kg/m  Wt Readings from Last 3 Encounters:  05/29/23 220 lb 12.8 oz (100.2 kg)  04/30/23 221 lb 3.2 oz (100.3 kg)  03/21/23 213 lb (96.6 kg)    Lab Results  Component Value Date   TSH 2.830 01/13/2023   Lab Results  Component Value Date   WBC 8.7 01/13/2023   HGB 15.1 01/13/2023   HCT 44.1 01/13/2023   MCV 97 01/13/2023   PLT 250 01/13/2023   Lab Results  Component Value Date   NA 141 01/13/2023   K 3.9 01/13/2023   CO2 18 (L) 01/13/2023   GLUCOSE 83 01/13/2023   BUN 10 01/13/2023   CREATININE 0.80 01/13/2023   BILITOT 0.4 01/13/2023   ALKPHOS 89 01/13/2023   AST 18 01/13/2023   ALT 18 01/13/2023   PROT 6.6 01/13/2023   ALBUMIN 4.4 01/13/2023   CALCIUM 9.3 01/13/2023   ANIONGAP 8 05/08/2022   EGFR 94 01/13/2023   Lab Results  Component Value Date   CHOL 189 01/13/2023   Lab Results  Component Value Date   HDL 46 01/13/2023   Lab Results  Component Value Date   LDLCALC 103 (H) 01/13/2023   Lab Results  Component Value Date   TRIG 234  (H) 01/13/2023   Lab Results  Component Value Date   CHOLHDL 4.1 01/13/2023   Lab Results  Component Value Date   HGBA1C 5.3 01/13/2023      Assessment & Plan:   Problem List Items Addressed This Visit       Cardiovascular and Mediastinum   Essential hypertension    BP Readings from Last 1 Encounters:  05/29/23 132/88  Usually well-controlled with amlodipine 5 mg daily and HCTZ 12.5 mg QD Counseled for compliance with the medications Advised  DASH diet and moderate exercise/walking, at least 150 mins/week      Relevant Orders   CMP14+EGFR   Hemoglobin A1c     Respiratory   Asthma, mild persistent    Well-controlled with Stiolto and albuterol as needed Has seen pulmonology      Relevant Medications   albuterol (VENTOLIN HFA) 108 (90 Base) MCG/ACT inhaler     Other   Morbid obesity due to excess calories (HCC) - Primary (Chronic)    BMI Readings from Last 3 Encounters:  05/29/23 43.12 kg/m  04/30/23 43.20 kg/m  03/21/23 41.60 kg/m  Has tried low-carb diet and exercise regimen Has been on phentermine in the past Not able to start GLP-1 agonist due to insurance coverage concern Had advised to think about bariatric surgery referral Started phentermine 37.5 mg every day   Advised to follow-up low-carb diet, do portion control and guided to use other resources to adhere to the diet such as apps (Noom, healthi)      Relevant Medications   phentermine (ADIPEX-P) 37.5 MG tablet   Recurrent major depressive disorder (HCC) (Chronic)    Uncontrolled currently due to acute stressors at her home Would avoid changing regimen for now Was well-controlled with Wellbutrin 300 mg QD      Hyperlipidemia    Last lipid profile reviewed DC Fenofibrate to reduce pill burden Check lipid profile      Relevant Orders   Lipid Profile   Environmental and seasonal allergies    Takes Xyzal 5 mg QD      Meds ordered this encounter  Medications   phentermine (ADIPEX-P)  37.5 MG tablet    Sig: Take 1 tablet (37.5 mg total) by mouth daily before breakfast.    Dispense:  30 tablet    Refill:  2   albuterol (VENTOLIN HFA) 108 (90 Base) MCG/ACT inhaler    Sig: Inhale 1-2 puffs into the lungs every 6 (six) hours as needed for wheezing or shortness of breath.    Dispense:  54 g    Refill:  2    Follow-up: Return in about 3 months (around 08/29/2023) for HTN and weight management.    Anabel Halon, MD

## 2023-05-29 NOTE — Assessment & Plan Note (Addendum)
Uncontrolled currently due to acute stressors at her home and recent weight gain Would avoid changing regimen for now Was well-controlled with Wellbutrin 300 mg QD

## 2023-05-29 NOTE — Patient Instructions (Signed)
Please start taking Phentermine half tablet once daily for 1 week and then 1 tablet once daily.  Please continue to take other medications as prescribed.  Please continue to follow low carb diet and perform moderate exercise/walking at least 150 mins/week.  Please get fasting blood tests done before the next visit.

## 2023-05-29 NOTE — Assessment & Plan Note (Signed)
Well-controlled with Stiolto and albuterol as needed Has seen pulmonology 

## 2023-05-29 NOTE — Assessment & Plan Note (Addendum)
BMI Readings from Last 3 Encounters:  05/29/23 43.12 kg/m  04/30/23 43.20 kg/m  03/21/23 41.60 kg/m   Has tried low-carb diet and exercise regimen Has been on phentermine in the past Not able to start GLP-1 agonist due to insurance coverage concern Had advised to think about bariatric surgery referral Started phentermine 37.5 mg every day   Advised to follow-up low-carb diet, do portion control and guided to use other resources to adhere to the diet such as apps (Noom, healthi)

## 2023-05-29 NOTE — Assessment & Plan Note (Signed)
Takes Xyzal 5 mg QD 

## 2023-05-29 NOTE — Assessment & Plan Note (Signed)
BP Readings from Last 1 Encounters:  05/29/23 132/88   Usually well-controlled with amlodipine 5 mg daily and HCTZ 12.5 mg QD Counseled for compliance with the medications Advised DASH diet and moderate exercise/walking, at least 150 mins/week

## 2023-07-16 ENCOUNTER — Encounter: Payer: Self-pay | Admitting: Emergency Medicine

## 2023-07-16 ENCOUNTER — Other Ambulatory Visit: Payer: Self-pay

## 2023-07-16 ENCOUNTER — Ambulatory Visit
Admission: EM | Admit: 2023-07-16 | Discharge: 2023-07-16 | Disposition: A | Discharge status: Home / Self Care | Payer: Medicaid Other | Attending: Nurse Practitioner | Admitting: Nurse Practitioner

## 2023-07-16 DIAGNOSIS — J0140 Acute pansinusitis, unspecified: Secondary | ICD-10-CM | POA: Diagnosis not present

## 2023-07-16 DIAGNOSIS — J4521 Mild intermittent asthma with (acute) exacerbation: Secondary | ICD-10-CM | POA: Diagnosis not present

## 2023-07-16 MED ORDER — FLUTICASONE PROPIONATE 50 MCG/ACT NA SUSP: 2.0000 | Freq: Every day | NASAL | 0 refills | Status: DC

## 2023-07-16 MED ORDER — PREDNISONE 20 MG PO TABS: 40.0000 mg | ORAL_TABLET | Freq: Every day | ORAL | 0 refills | Status: AC

## 2023-07-16 MED ORDER — ALBUTEROL SULFATE HFA 108 (90 BASE) MCG/ACT IN AERS: 2.0000 | INHALATION_SPRAY | Freq: Four times a day (QID) | RESPIRATORY_TRACT | 0 refills | Status: DC | PRN

## 2023-07-16 MED ORDER — PROMETHAZINE-DM 6.25-15 MG/5ML PO SYRP: 5.0000 mL | ORAL_SOLUTION | Freq: Four times a day (QID) | ORAL | 0 refills | Status: DC | PRN

## 2023-07-16 MED ORDER — AMOXICILLIN-POT CLAVULANATE 875-125 MG PO TABS
1.0000 | ORAL_TABLET | Freq: Two times a day (BID) | ORAL | 0 refills | Status: AC
Start: 2023-07-16 — End: 2023-07-21

## 2023-07-16 NOTE — ED Provider Notes (Signed)
[facility]    CSN: [id number] Arrival date & time: 07/16/23  1829      History   Chief Complaint Chief Complaint  Patient presents with   Cough    HPI Destiny Petty is a 42 y.o. female.   The history is provided by the patient.   The patient presents with a 2-week history of cough, nasal congestion, sinus pressure, and chest pain.  Patient reports that she has also been wheezing since symptom onset.  Patient denies fever, chills, difficulty breathing, abdominal pain, nausea, vomiting, or diarrhea.  Patient reports she has been using her albuterol inhaler.  Patient states that she has continued to smoke, but "not as much".  Patient states that her boyfriend has had a persistent cough as he works in buildings that may have mold or other pollutants.  Patient has been taking Xyzal for her symptoms.  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 Scheduled for Cholecystectomy   GERD (gastroesophageal reflux disease)    Hemorrhoids 02/10/2015   Hiatal hernia    Hyperlipidemia 04/09/2011   Hypertension    no meds   Injury of digital nerve of right little finger 08/23/2020   Laceration of blood vessel of right little finger 08/23/2020   Laceration of finger of left hand without damage to nail    left small finger   Menorrhagia with regular cycle 04/10/2016   Migraine 01/15/2016   Overview:  Overview:  IMO Load 2016 R1.3    Muscle spasm of back 03/13/2016   Numbness and tingling in left hand 08/19/2016   Obesity    Ovarian cyst 12/18/2016   Perianal wart 02/10/2015   Stress 02/14/2016   Weight loss counseling, encounter for 03/13/2016    Patient Active Problem List   Diagnosis Date Noted   Encounter for Papanicolaou smear of cervix 03/21/2023   Loss of taste 02/25/2023   Acute pain of right shoulder 02/25/2023   Prolapsed internal hemorrhoids, grade 3    DOE (dyspnea on exertion) 03/26/2021   Cigarette smoker 03/26/2021   Environmental and seasonal allergies 07/28/2020   Arthritis of left knee 06/01/2020   Encounter for general adult medical examination with abnormal findings 06/01/2020   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   Back pain with right-sided sciatica 06/18/2016   Morbid obesity due to excess calories (HCC) 03/13/2016   Migraine 01/15/2016   Recurrent major depressive disorder (HCC) 01/15/2016   Hemorrhoid 02/10/2015   Tobacco abuse 07/10/2013   GERD (gastroesophageal reflux disease) 07/10/2013   Essential hypertension 07/08/2013   Asthma, mild persistent 04/05/2013   Orthopnea 12/18/2011   Hyperlipidemia 04/09/2011    Past Surgical History:  Procedure Laterality Date   ABLATION     CESAREAN SECTION     CESAREAN SECTION N/A    Phreesia 05/23/2020   CHOLECYSTECTOMY     COLONOSCOPY  DILATATION AND CURETTAGE/HYSTEROSCOPY WITH MINERVA N/A 08/31/2019   Procedure: DILATATION AND CURETTAGE (no specimen) /HYSTEROSCOPY WITH MINERVA ENDOMETRIAL ABLATION;  Surgeon: Tilda Burrow, MD;  Location: [facility];  Service: Gynecology;  Laterality: N/A;   ESOPHAGOGASTRODUODENOSCOPY ENDOSCOPY     HEMORRHOID SURGERY N/A 05/10/2022   Procedure: HEMORRHOIDECTOMY, EXTENSIVE;  Surgeon: Franky Macho, MD;  Location: [facility];  Service: General;  Laterality: N/A;   TENDON EXPLORATION Left 08/10/2020   Procedure: LEFT SMALL FINER EXPLORATION, REPAIR OF TENDON, ARTERY, NERVE;  Surgeon: Betha Loa, MD;   Location: [facility];  Service: Orthopedics;  Laterality: Left;   TUBAL LIGATION      OB History     Gravida  2   Para  2   Term  2   Preterm      AB      Living  2      SAB      IAB      Ectopic      Multiple      Live Births  2            Home Medications    Prior to Admission medications   Medication Sig Start Date End Date Taking? Authorizing Provider  albuterol (VENTOLIN HFA) 108 (90 Base) MCG/ACT inhaler Inhale 2 puffs into the lungs every 6 (six) hours as needed for wheezing or shortness of breath. 07/16/23  Yes -Warren, Sadie Haber, NP  amoxicillin-clavulanate (AUGMENTIN) 875-125 MG tablet Take 1 tablet by mouth every 12 (twelve) hours for 5 days. 07/16/23 07/21/23 Yes -Warren, Sadie Haber, NP  fluticasone (FLONASE) 50 MCG/ACT nasal spray Place 2 sprays into both nostrils daily. 07/16/23  Yes -Warren, Sadie Haber, NP  predniSONE (DELTASONE) 20 MG tablet Take 2 tablets (40 mg total) by mouth daily with breakfast for 5 days. 07/16/23 07/21/23 Yes -Warren, Sadie Haber, NP  promethazine-dextromethorphan (PROMETHAZINE-DM) 6.25-15 MG/5ML syrup Take 5 mLs by mouth 4 (four) times daily as needed. 07/16/23  Yes -Warren, Sadie Haber, NP  amLODipine (NORVASC) 5 MG tablet Take 1 tablet (5 mg total) by mouth daily. 12/12/22   Anabel Halon, MD  buPROPion (WELLBUTRIN XL) 300 MG 24 hr tablet Take 1 tablet (300 mg total) by mouth daily. 12/12/22   Anabel Halon, MD  cetirizine (ZYRTEC) 10 MG tablet Take 1 tablet (10 mg total) by mouth daily. 03/21/23   Del Newman Nip, Tenna Child, FNP  famotidine (PEPCID) 20 MG tablet One after supper 09/23/22   Anabel Halon, MD  hydrochlorothiazide (MICROZIDE) 12.5 MG capsule Take 1 capsule (12.5 mg total) by mouth daily. 12/12/22   Anabel Halon, MD  levocetirizine (XYZAL) 5 MG tablet Take 1 tablet (5 mg total) by mouth every evening. Patient not taking: Reported on 04/30/2023 02/10/22   Wallis Bamberg, PA-C  meloxicam  (MOBIC) 7.5 MG tablet Take 1 tablet (7.5 mg total) by mouth daily. Patient not taking: Reported on 04/30/2023 02/25/23   Anabel Halon, MD  phentermine (ADIPEX-P) 37.5 MG tablet Take 1 tablet (37.5 mg total) by mouth daily before breakfast. 05/29/23   Anabel Halon, MD  rizatriptan (MAXALT-MLT) 10 MG disintegrating tablet TAKE 1 TABLET BY MOUTH ONCE AS NEEDED FOR MIGRAINE. MAY REPEAT IN 2 HOURS IF NEEDED. 03/21/23   Del Nigel Berthold, FNP  Tiotropium Bromide-Olodaterol 2.5-2.5 MCG/ACT AERS Inhale 2 puffs into the lungs daily. Patient not taking: Reported on 04/30/2023 03/21/23   Del Nigel Berthold, FNP  traMADol (ULTRAM) 50 MG tablet Take by  mouth. Patient not taking: Reported on 04/30/2023 08/23/20   [provider]    Family History Family History  Problem Relation Age of Onset   Diabetes Mother    Kidney failure Mother    Early death Mother 77       diabetes complications   Cancer Father 59       colon cancer   Depression Sister    Hypertension Sister    Anxiety disorder Sister    Other Sister        back problems   ADD / ADHD Daughter    Other Daughter        behavioral issues   ODD Daughter    Asthma Son    Cancer Maternal Grandmother        breast   Seizures Maternal Grandmother    Lupus Other    Breast cancer Maternal Aunt     Social History Social History   Tobacco Use   Smoking status: Every Day    Current packs/day: 1.00    Average packs/day: 1 pack/day for 26.0 years (26.0 ttl pk-yrs)    Types: Cigarettes   Smokeless tobacco: Never   Tobacco comments:    1/4 ppd 05/30/21  Vaping Use   Vaping status: Never Used  Substance Use Topics   Alcohol use: Yes    Comment: occ   Drug use: No     Allergies   Bee venom, Latex, and Lisinopril   Review of Systems Review of Systems Per HPI  Physical Exam Triage Vital Signs ED Triage Vitals  Encounter Vitals Group     BP 07/16/23 1834 (!) 141/85     Systolic BP Percentile --       Diastolic BP Percentile --      Pulse Rate 07/16/23 1834 100     Resp 07/16/23 1834 20     Temp 07/16/23 1834 98.2 F (36.8 C)     Temp Source 07/16/23 1834 Oral     SpO2 07/16/23 1834 98 %     Weight --      Height --      Head Circumference --      Peak Flow --      Pain Score 07/16/23 1835 8     Pain Loc --      Pain Education --      Exclude from Growth Chart --    No data found.  Updated Vital Signs BP (!) 141/85 (BP Location: Right Arm)   Pulse 100   Temp 98.2 F (36.8 C) (Oral)   Resp 20   SpO2 98%   Visual Acuity Right Eye Distance:   Left Eye Distance:   Bilateral Distance:    Right Eye Near:   Left Eye Near:    Bilateral Near:     Physical Exam Vitals and nursing note reviewed.  Constitutional:      General: She is not in acute distress.    Appearance: Normal appearance.  HENT:     Head: Normocephalic.     Right Ear: Tympanic membrane, ear canal and external ear normal.     Left Ear: Tympanic membrane, ear canal and external ear normal.     Nose: Congestion present. No rhinorrhea.     Right Turbinates: Enlarged and swollen.     Left Turbinates: Enlarged and swollen.     Right Sinus: Maxillary sinus tenderness present. No frontal sinus tenderness.     Left Sinus: Maxillary sinus tenderness present. No frontal  sinus tenderness.     Mouth/Throat:     Mouth: Mucous membranes are moist.     Pharynx: Posterior oropharyngeal erythema present.  Eyes:     Extraocular Movements: Extraocular movements intact.     Conjunctiva/sclera: Conjunctivae normal.     Pupils: Pupils are equal, round, and reactive to light.  Cardiovascular:     Rate and Rhythm: Normal rate and regular rhythm.     Pulses: Normal pulses.     Heart sounds: Normal heart sounds.  Pulmonary:     Effort: Pulmonary effort is normal. No respiratory distress.     Breath sounds: Normal breath sounds. No stridor. No wheezing, rhonchi or rales.  Abdominal:     General: Bowel sounds are normal.      Palpations: Abdomen is soft.     Tenderness: There is no abdominal tenderness.  Musculoskeletal:     Cervical back: Normal range of motion.  Lymphadenopathy:     Cervical: No cervical adenopathy.  Skin:    General: Skin is warm and dry.  Neurological:     General: No focal deficit present.     Mental Status: She is alert and oriented to person, place, and time.  Psychiatric:        Mood and Affect: Mood normal.        Behavior: Behavior normal.      UC Treatments / Results  Labs (all labs ordered are listed, but only abnormal results are displayed) Labs Reviewed - No data to display  EKG: Normal sinus rhythm w/o ectopy, no STEMI   Radiology No results found.  Procedures Procedures (including critical care time)  Medications Ordered in UC Medications - No data to display  Initial Impression / Assessment and Plan / UC Course  I have reviewed the triage vital signs and the nursing notes.  Pertinent labs & imaging results that were available during my care of the patient were reviewed by me and considered in my medical decision making (see chart for details).  The patient is well-appearing, she is in no acute distress, vital signs are stable.   Symptoms consistent with acute pansinusitis.  EKG was performed which shows normal sinus rhythm, chest pain likely associated with persistent cough.  Will provide treatment with Augmentin 875/125mg , prednisone 40 mg for asthma exacerbation, fluticasone 50 micro nasal spray for nasal congestion runny nose, Promethazine DM for cough, and refill of her albuterol inhaler as needed for wheezing or shortness of breath. Supportive care recommendations were provided to the patient to include increasing fluids, allowing for plenty of rest, and use of a humidifier during bedtime during sleep.  Advised patient that if symptoms do not improve over the next 7 to 10 days recommend follow-up for further evaluation.  Patient verbalizes  understanding.  All questions were answered.  Patient is stable for discharge.   Final Clinical Impressions(s) / UC Diagnoses   Final diagnoses:  Acute pansinusitis, recurrence not specified  Mild intermittent asthma with acute exacerbation     Discharge Instructions      Take medication as directed.  Your primary care physician has sent in prescriptions for your albuterol inhaler. Increase fluids and get plenty of rest. May take over-the-counter ibuprofen or Tylenol as needed for pain, fever, or general discomfort. Recommend normal saline nasal spray to help with nasal congestion throughout the day. For your cough and nasal congestion, it may be helpful to use a humidifier at bedtime during sleep and sleeping elevated on pillows.. If symptoms do not  improve with this treatment, please follow-up with your primary care physician for further evaluation.  Follow-up as needed.       ED Prescriptions     Medication Sig Dispense Auth. Provider   amoxicillin-clavulanate (AUGMENTIN) 875-125 MG tablet Take 1 tablet by mouth every 12 (twelve) hours for 5 days. 10 tablet -Warren, Sadie Haber, NP   predniSONE (DELTASONE) 20 MG tablet Take 2 tablets (40 mg total) by mouth daily with breakfast for 5 days. 10 tablet -Warren, Sadie Haber, NP   fluticasone (FLONASE) 50 MCG/ACT nasal spray Place 2 sprays into both nostrils daily. 16 g -Warren, Sadie Haber, NP   albuterol (VENTOLIN HFA) 108 (90 Base) MCG/ACT inhaler Inhale 2 puffs into the lungs every 6 (six) hours as needed for wheezing or shortness of breath. 8 g -Warren, Sadie Haber, NP   promethazine-dextromethorphan (PROMETHAZINE-DM) 6.25-15 MG/5ML syrup Take 5 mLs by mouth 4 (four) times daily as needed. 118 mL -Warren, Sadie Haber, NP      PDMP not reviewed this encounter.   Abran Cantor, NP 07/16/23 2009

## 2023-07-16 NOTE — Discharge Instructions (Addendum)
Take medication as directed.  Your primary care physician has sent in prescriptions for your albuterol inhaler. Increase fluids and get plenty of rest. May take over-the-counter ibuprofen or Tylenol as needed for pain, fever, or general discomfort. Recommend normal saline nasal spray to help with nasal congestion throughout the day. For your cough and nasal congestion, it may be helpful to use a humidifier at bedtime during sleep and sleeping elevated on pillows.. If symptoms do not improve with this treatment, please follow-up with your primary care physician for further evaluation.  Follow-up as needed.

## 2023-07-16 NOTE — ED Triage Notes (Signed)
Pt reports cough for last several weeks and reports elevated BP at home and chest pain x3 days. Pt reports is currently being treated for HTN but reports "I need something else I think." NAD noted.

## 2023-07-17 ENCOUNTER — Ambulatory Visit: Payer: Self-pay

## 2023-08-29 ENCOUNTER — Ambulatory Visit: Payer: Medicaid Other | Admitting: Internal Medicine

## 2023-08-29 ENCOUNTER — Encounter: Payer: Self-pay | Admitting: Internal Medicine

## 2023-08-29 DIAGNOSIS — Z2821 Immunization not carried out because of patient refusal: Secondary | ICD-10-CM

## 2023-08-29 DIAGNOSIS — I10 Essential (primary) hypertension: Secondary | ICD-10-CM

## 2023-08-29 DIAGNOSIS — E782 Mixed hyperlipidemia: Secondary | ICD-10-CM | POA: Diagnosis not present

## 2023-08-29 DIAGNOSIS — G43719 Chronic migraine without aura, intractable, without status migrainosus: Secondary | ICD-10-CM

## 2023-08-29 DIAGNOSIS — F331 Major depressive disorder, recurrent, moderate: Secondary | ICD-10-CM | POA: Diagnosis not present

## 2023-08-29 MED ORDER — AMLODIPINE BESYLATE 5 MG PO TABS
5.0000 mg | ORAL_TABLET | Freq: Every day | ORAL | 1 refills | Status: DC
Start: 2023-08-29 — End: 2023-12-01

## 2023-08-29 MED ORDER — PHENTERMINE HCL 37.5 MG PO TABS
37.5000 mg | ORAL_TABLET | Freq: Every day | ORAL | 0 refills | Status: DC
Start: 2023-08-29 — End: 2023-09-24

## 2023-08-29 MED ORDER — WEGOVY 0.25 MG/0.5ML ~~LOC~~ SOAJ
0.2500 mg | SUBCUTANEOUS | 0 refills | Status: DC
Start: 2023-08-29 — End: 2023-09-24

## 2023-08-29 NOTE — Progress Notes (Signed)
Established Patient Office Visit  Subjective:  Patient ID: Destiny Petty, female    DOB: October 18, 1981  Age: 42 y.o. MRN: [id number]  CC:  Chief Complaint  Patient presents with   Hypertension    Three month follow up    Weight Loss    Follow up     HPI Destiny Petty is a 42 y.o. female with past medical history of asthma, GERD, migraine, HLD, depression and morbid obesity who presents for f/u of her chronic medical conditions.  HTN: Her BP wnl today, but has had elevated BP at times.  She has been taking amlodipine, but not regularly. She denies any chest pain or palpitations.  Migraine: She complains of intermittent episodes of headache, which is sharp, nonradiating, and is associated with severe nausea.  She has Maxalt as needed, which improves her headache  Asthma:  She uses Stiolto as maintenance inhaler and Albuterol PRN for dyspnea or wheezing.  Denies any fever or chills.  She takes Xyzal for allergies.  Obesity: She has responded well to phentermine and has lost about 12 lbs since since the last visit.  She is trying to cut down soft drinks and has been following low-carb diet.  She complained of chronic fatigue due to recent weight gain in the last visit, which is improving.  MDD:  She is on Wellbutrin, which was started to help her quit smoking.  She still smokes, but reports that she has not been smoking on a daily basis.  Denies any SI or HI currently.  She attributes her mood symptoms to weight gain.   HLD: She takes fenofibrate for hyper TG.  Her last lipid profile shows TG WNL range.     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 Scheduled for Cholecystectomy   GERD (gastroesophageal reflux disease)    Hemorrhoids 02/10/2015   Hiatal hernia    Hyperlipidemia 04/09/2011   Hypertension    no meds   Injury of digital nerve of right little finger 08/23/2020   Laceration of blood vessel of right little finger 08/23/2020   Laceration of finger of left hand without damage to nail    left small finger   Menorrhagia with regular cycle 04/10/2016   Migraine 01/15/2016   Overview:  Overview:  IMO Load 2016 R1.3   Muscle spasm of back 03/13/2016   Numbness and tingling in left hand 08/19/2016   Obesity    Ovarian cyst 12/18/2016   Perianal wart 02/10/2015   Stress 02/14/2016   Weight loss counseling, encounter for 03/13/2016    Past Surgical History:  Procedure Laterality Date   ABLATION     CESAREAN SECTION     CESAREAN SECTION N/A    Phreesia 05/23/2020   CHOLECYSTECTOMY     COLONOSCOPY     DILATATION AND CURETTAGE/HYSTEROSCOPY WITH MINERVA N/A 08/31/2019   Procedure: DILATATION  AND CURETTAGE (no specimen) /HYSTEROSCOPY WITH MINERVA ENDOMETRIAL ABLATION;  Surgeon: Tilda Burrow, MD;  Location: [facility];  Service: Gynecology;  Laterality: N/A;   ESOPHAGOGASTRODUODENOSCOPY ENDOSCOPY     HEMORRHOID SURGERY N/A 05/10/2022   Procedure: HEMORRHOIDECTOMY, EXTENSIVE;  Surgeon: Franky Macho, MD;  Location: [facility];  Service: General;  Laterality: N/A;   TENDON EXPLORATION Left 08/10/2020   Procedure: LEFT SMALL FINER EXPLORATION, REPAIR OF TENDON, ARTERY, NERVE;  Surgeon: Betha Loa, MD;  Location: [facility];  Service: Orthopedics;  Laterality: Left;   TUBAL LIGATION      Family History  Problem Relation Age of Onset   Diabetes Mother    Kidney failure Mother    Early death Mother  64       diabetes complications   Cancer Father 69       colon cancer   Depression Sister    Hypertension Sister    Anxiety disorder Sister    Other Sister        back problems   ADD / ADHD Daughter    Other Daughter        behavioral issues   ODD Daughter    Asthma Son    Cancer Maternal Grandmother        breast   Seizures Maternal Grandmother    Lupus Other    Breast cancer Maternal Aunt     Social History   Socioeconomic History   Marital status: Legally Separated    Spouse name: Not on file   Number of children: 2   Years of education: 14   Highest education level: Not on file  Occupational History   Not on file  Tobacco Use   Smoking status: Every Day    Current packs/day: 1.00    Average packs/day: 1 pack/day for 26.0 years (26.0 ttl pk-yrs)    Types: Cigarettes   Smokeless tobacco: Never   Tobacco comments:    1/4 ppd 05/30/21  Vaping Use   Vaping status: Never Used  Substance and Sexual Activity   Alcohol use: Yes    Comment: occ   Drug use: No   Sexual activity: Not Currently    Birth control/protection: Surgical    Comment: tubal  Other Topics Concern   Not on file  Social History Narrative   Legally separated right now   2 children: son 65 and daughter 52 lives with her      2 dogs: macho, mary jane   2 birds:    1 cats: boghgie       Enjoys: spending time with kids, outside things      Diet: eat all food groups    Caffeine: coffee and soda daily   Water: 4 cups daily      Wears seat belt   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   No weapons        Social Determinants of Health   Financial Resource Strain: Low Risk  (06/01/2020)   Overall Financial Resource Strain (CARDIA)    Difficulty of Paying Living Expenses: Not hard at all  Food Insecurity: No Food Insecurity (06/01/2020)   Hunger Vital Sign    Worried About Running Out of Food in the Last Year: Never true    Ran Out of Food in the Last Year: Never true   Transportation Needs: No Transportation Needs (06/01/2020)   PRAPARE - Transportation    Lack of Transportation (Medical): No    Lack  of Transportation (Non-Medical): No  Physical Activity: Insufficiently Active (06/01/2020)   Exercise Vital Sign    Days of Exercise per Week: 2 days    Minutes of Exercise per Session: 30 min  Stress: No Stress Concern Present (06/01/2020)   [employer] of Occupational Health - Occupational Stress Questionnaire    Feeling of Stress : Not at all  Social Connections: Moderately Isolated (06/01/2020)   Social Connection and Isolation Panel [NHANES]    Frequency of Communication with Friends and Family: More than three times a week    Frequency of Social Gatherings with Friends and Family: More than three times a week    Attends Religious Services: More than 4 times per year    Active Member of [employer] or Organizations: No    Attends Banker Meetings: Never    Marital Status: Separated  Intimate Partner Violence: Not At Risk (06/01/2020)   Humiliation, Afraid, Rape, and Kick questionnaire    Fear of Current or Ex-Partner: No    Emotionally Abused: No    Physically Abused: No    Sexually Abused: No    Outpatient Medications Prior to Visit  Medication Sig Dispense Refill   albuterol (VENTOLIN HFA) 108 (90 Base) MCG/ACT inhaler Inhale 2 puffs into the lungs every 6 (six) hours as needed for wheezing or shortness of breath. 8 g 0   buPROPion (WELLBUTRIN XL) 300 MG 24 hr tablet Take 1 tablet (300 mg total) by mouth daily. 90 tablet 1   cetirizine (ZYRTEC) 10 MG tablet Take 1 tablet (10 mg total) by mouth daily. 30 tablet 0   famotidine (PEPCID) 20 MG tablet One after supper 90 tablet 3   fluticasone (FLONASE) 50 MCG/ACT nasal spray Place 2 sprays into both nostrils daily. 16 g 0   levocetirizine (XYZAL) 5 MG tablet Take 1 tablet (5 mg total) by mouth every evening. (Patient not taking: Reported on 04/30/2023) 90 tablet 0   meloxicam (MOBIC) 7.5  MG tablet Take 1 tablet (7.5 mg total) by mouth daily. (Patient not taking: Reported on 04/30/2023) 30 tablet 0   promethazine-dextromethorphan (PROMETHAZINE-DM) 6.25-15 MG/5ML syrup Take 5 mLs by mouth 4 (four) times daily as needed. 118 mL 0   rizatriptan (MAXALT-MLT) 10 MG disintegrating tablet TAKE 1 TABLET BY MOUTH ONCE AS NEEDED FOR MIGRAINE. MAY REPEAT IN 2 HOURS IF NEEDED. 10 tablet 2   Tiotropium Bromide-Olodaterol 2.5-2.5 MCG/ACT AERS Inhale 2 puffs into the lungs daily. (Patient not taking: Reported on 04/30/2023) 4 g 5   traMADol (ULTRAM) 50 MG tablet Take by mouth. (Patient not taking: Reported on 04/30/2023)     amLODipine (NORVASC) 5 MG tablet Take 1 tablet (5 mg total) by mouth daily. 90 tablet 0   hydrochlorothiazide (MICROZIDE) 12.5 MG capsule Take 1 capsule (12.5 mg total) by mouth daily. 90 capsule 0   phentermine (ADIPEX-P) 37.5 MG tablet Take 1 tablet (37.5 mg total) by mouth daily before breakfast. 30 tablet 2   No facility-administered medications prior to visit.    Allergies  Allergen Reactions   Bee Venom Shortness Of Breath   Latex Itching   Lisinopril Cough    ROS Review of Systems  Constitutional:  Positive for fatigue. Negative for chills and fever.  HENT:  Negative for congestion, sinus pressure and sore throat.   Eyes:  Negative for pain and discharge.  Respiratory:  Positive for shortness of breath. Negative for cough.   Cardiovascular:  Negative for chest pain and palpitations.  Gastrointestinal:  Negative  for abdominal pain, diarrhea, nausea and vomiting.  Endocrine: Negative for polydipsia and polyuria.  Genitourinary:  Negative for dysuria and hematuria.  Musculoskeletal:  Negative for neck pain and neck stiffness.  Skin:  Negative for rash.  Neurological:  Positive for headaches. Negative for dizziness and weakness.  Psychiatric/Behavioral:  Negative for agitation and behavioral problems. The patient is nervous/anxious.       Objective:     Physical Exam Vitals reviewed.  Constitutional:      General: She is not in acute distress.    Appearance: She is obese. She is not diaphoretic.  HENT:     Head: Normocephalic and atraumatic.     Nose: No congestion.     Mouth/Throat:     Mouth: Mucous membranes are moist.     Pharynx: No posterior oropharyngeal erythema.  Eyes:     General: No scleral icterus.    Extraocular Movements: Extraocular movements intact.  Cardiovascular:     Rate and Rhythm: Normal rate and regular rhythm.     Pulses: Normal pulses.     Heart sounds: Normal heart sounds. No murmur heard. Pulmonary:     Breath sounds: Normal breath sounds. No wheezing or rales.  Musculoskeletal:     Cervical back: Neck supple. No tenderness.     Right lower leg: No edema.     Left lower leg: No edema.  Skin:    General: Skin is warm.     Findings: No rash.  Neurological:     General: No focal deficit present.     Mental Status: She is alert and oriented to person, place, and time.  Psychiatric:        Mood and Affect: Mood normal.        Behavior: Behavior normal.     BP 128/88 (BP Location: Right Arm)   Pulse 86   Ht 5' (1.524 m)   Wt 208 lb 3.2 oz (94.4 kg)   SpO2 94%   BMI 40.66 kg/m  Wt Readings from Last 3 Encounters:  08/29/23 208 lb 3.2 oz (94.4 kg)  05/29/23 220 lb 12.8 oz (100.2 kg)  04/30/23 221 lb 3.2 oz (100.3 kg)    Lab Results  Component Value Date   TSH 2.830 01/13/2023   Lab Results  Component Value Date   WBC 8.7 01/13/2023   HGB 15.1 01/13/2023   HCT 44.1 01/13/2023   MCV 97 01/13/2023   PLT 250 01/13/2023   Lab Results  Component Value Date   NA 141 01/13/2023   K 3.9 01/13/2023   CO2 18 (L) 01/13/2023   GLUCOSE 83 01/13/2023   BUN 10 01/13/2023   CREATININE 0.80 01/13/2023   BILITOT 0.4 01/13/2023   ALKPHOS 89 01/13/2023   AST 18 01/13/2023   ALT 18 01/13/2023   PROT 6.6 01/13/2023   ALBUMIN 4.4 01/13/2023   CALCIUM 9.3 01/13/2023   ANIONGAP 8 05/08/2022    EGFR 94 01/13/2023   Lab Results  Component Value Date   CHOL 189 01/13/2023   Lab Results  Component Value Date   HDL 46 01/13/2023   Lab Results  Component Value Date   LDLCALC 103 (H) 01/13/2023   Lab Results  Component Value Date   TRIG 234 (H) 01/13/2023   Lab Results  Component Value Date   CHOLHDL 4.1 01/13/2023   Lab Results  Component Value Date   HGBA1C 5.3 01/13/2023      Assessment & Plan:   Problem List Items Addressed This  Visit       Cardiovascular and Mediastinum   Essential hypertension    BP Readings from Last 1 Encounters:  08/29/23 128/88   Usually well-controlled with amlodipine 5 mg daily  DC HCTZ 12.5 mg Qdb as she has not taken it recently Counseled for compliance with the medications Advised DASH diet and moderate exercise/walking, at least 150 mins/week      Relevant Medications   amLODipine (NORVASC) 5 MG tablet   Migraine    Well-controlled with PRN Maxalt, refilled If frequent episodes of migraine, will add topiramate - has tried it before      Relevant Medications   amLODipine (NORVASC) 5 MG tablet     Other   Morbid obesity due to excess calories (HCC) - Primary (Chronic)    BMI Readings from Last 3 Encounters:  05/29/23 43.12 kg/m  04/30/23 43.20 kg/m  03/21/23 41.60 kg/m   Has tried low-carb diet and exercise regimen Has been on phentermine - lost about 12 lbs, but has run out now She is a good candidate for GLP-1 agonist Started Wegovy -initial BMI: 40.66, plan to increase dose as tolerated Had advised to think about bariatric surgery referral in the past   Advised to follow-up low-carb diet, do portion control and guided to use other resources to adhere to the diet such as apps (Noom, healthi)      Relevant Medications   Semaglutide-Weight Management (WEGOVY) 0.25 MG/0.5ML SOAJ   phentermine (ADIPEX-P) 37.5 MG tablet   Recurrent major depressive disorder (HCC) (Chronic)    Uncontrolled currently due to  acute stressors at her home and recent weight gain, but improving with weight loss Would avoid changing regimen for now Was well-controlled with Wellbutrin 300 mg QD      Hyperlipidemia    Last lipid profile reviewed DCed Fenofibrate to reduce pill burden Check lipid profile      Relevant Medications   amLODipine (NORVASC) 5 MG tablet   Other Visit Diagnoses     Refused influenza vaccine           Meds ordered this encounter  Medications   Semaglutide-Weight Management (WEGOVY) 0.25 MG/0.5ML SOAJ    Sig: Inject 0.25 mg into the skin every 7 (seven) days.    Dispense:  2 mL    Refill:  0   amLODipine (NORVASC) 5 MG tablet    Sig: Take 1 tablet (5 mg total) by mouth daily.    Dispense:  90 tablet    Refill:  1   phentermine (ADIPEX-P) 37.5 MG tablet    Sig: Take 1 tablet (37.5 mg total) by mouth daily before breakfast.    Dispense:  30 tablet    Refill:  0    UNTIL SHE GETS WEGOVY.    Follow-up: Return in about 3 months (around 11/28/2023) for Weight management.    Anabel Halon, MD

## 2023-08-29 NOTE — Assessment & Plan Note (Addendum)
BP Readings from Last 1 Encounters:  08/29/23 128/88   Usually well-controlled with amlodipine 5 mg daily  DC HCTZ 12.5 mg Qdb as she has not taken it recently Counseled for compliance with the medications Advised DASH diet and moderate exercise/walking, at least 150 mins/week

## 2023-08-29 NOTE — Assessment & Plan Note (Addendum)
BMI Readings from Last 3 Encounters:  05/29/23 43.12 kg/m  04/30/23 43.20 kg/m  03/21/23 41.60 kg/m   Has tried low-carb diet and exercise regimen Has been on phentermine - lost about 12 lbs, but has run out now She is a good candidate for GLP-1 agonist Started Wegovy -initial BMI: 40.66, plan to increase dose as tolerated Had advised to think about bariatric surgery referral in the past   Advised to follow-up low-carb diet, do portion control and guided to use other resources to adhere to the diet such as apps (Noom, healthi)

## 2023-08-29 NOTE — Assessment & Plan Note (Signed)
Uncontrolled currently due to acute stressors at her home and recent weight gain, but improving with weight loss Would avoid changing regimen for now Was well-controlled with Wellbutrin 300 mg QD

## 2023-08-29 NOTE — Assessment & Plan Note (Signed)
Last lipid profile reviewed DCed Fenofibrate to reduce pill burden Check lipid profile

## 2023-08-29 NOTE — Patient Instructions (Signed)
Please start taking Wegovy as prescribed.  Please stop taking Phentermine once you start [facility].  Please continue to take other medications as prescribed.  Please continue to follow low carb diet and perform moderate exercise/walking at least 150 mins/week.

## 2023-08-29 NOTE — Assessment & Plan Note (Signed)
Well-controlled with PRN Maxalt, refilled If frequent episodes of migraine, will add topiramate - has tried it before

## 2023-09-24 ENCOUNTER — Other Ambulatory Visit: Payer: Self-pay | Admitting: Internal Medicine

## 2023-09-24 ENCOUNTER — Telehealth: Payer: Self-pay | Admitting: Internal Medicine

## 2023-09-24 MED ORDER — WEGOVY 0.5 MG/0.5ML ~~LOC~~ SOAJ
0.5000 mg | SUBCUTANEOUS | 0 refills | Status: DC
Start: 2023-09-24 — End: 2023-10-22

## 2023-09-24 NOTE — Telephone Encounter (Signed)
Patient called and said this is not refill.  Needs a new prescription wegovy 0.5 mg   Pharmacy: [facility]

## 2023-09-24 NOTE — Telephone Encounter (Signed)
Patient advised.

## 2023-10-17 ENCOUNTER — Telehealth: Payer: Self-pay | Admitting: Internal Medicine

## 2023-10-17 NOTE — Telephone Encounter (Signed)
Copied from CRM [id number]. Topic: Clinical - Prescription Issue >> Oct 17, 2023  3:23 PM Sasha H wrote:   Reason for CRM: PT is wanting nausea medication called in. Her provider told her if the weight loss medication she is on, causes nausea, that she could call and request nausea med be sent in.

## 2023-10-20 ENCOUNTER — Other Ambulatory Visit: Payer: Self-pay | Admitting: Internal Medicine

## 2023-10-20 DIAGNOSIS — R110 Nausea: Secondary | ICD-10-CM

## 2023-10-20 MED ORDER — ONDANSETRON HCL 4 MG PO TABS: 4.0000 mg | ORAL_TABLET | Freq: Three times a day (TID) | ORAL | 0 refills | Status: DC | PRN

## 2023-10-20 NOTE — Telephone Encounter (Signed)
Patient advised.

## 2023-10-22 ENCOUNTER — Other Ambulatory Visit: Payer: Self-pay | Admitting: Internal Medicine

## 2023-10-22 MED ORDER — WEGOVY 1 MG/0.5ML ~~LOC~~ SOAJ
1.0000 mg | SUBCUTANEOUS | 0 refills | Status: DC
Start: 2023-10-22 — End: 2023-11-20

## 2023-11-09 ENCOUNTER — Ambulatory Visit
Admission: EM | Admit: 2023-11-09 | Discharge: 2023-11-09 | Disposition: A | Discharge status: Home / Self Care | Payer: Medicaid Other | Attending: Family Medicine | Admitting: Family Medicine

## 2023-11-09 DIAGNOSIS — R062 Wheezing: Secondary | ICD-10-CM

## 2023-11-09 DIAGNOSIS — H66001 Acute suppurative otitis media without spontaneous rupture of ear drum, right ear: Secondary | ICD-10-CM

## 2023-11-09 DIAGNOSIS — J4541 Moderate persistent asthma with (acute) exacerbation: Secondary | ICD-10-CM | POA: Diagnosis not present

## 2023-11-09 DIAGNOSIS — J069 Acute upper respiratory infection, unspecified: Secondary | ICD-10-CM | POA: Diagnosis not present

## 2023-11-09 MED ORDER — FLUTICASONE PROPIONATE 50 MCG/ACT NA SUSP: 1.0000 | Freq: Two times a day (BID) | NASAL | 2 refills | Status: DC

## 2023-11-09 MED ORDER — AMOXICILLIN 875 MG PO TABS: 875.0000 mg | ORAL_TABLET | Freq: Two times a day (BID) | ORAL | 0 refills | Status: DC

## 2023-11-09 MED ORDER — PROMETHAZINE-DM 6.25-15 MG/5ML PO SYRP: 5.0000 mL | ORAL_SOLUTION | Freq: Four times a day (QID) | ORAL | 0 refills | Status: DC | PRN

## 2023-11-09 NOTE — ED Triage Notes (Signed)
Pt reports chest congestion, nasal congestion, right ear pain x 3 days

## 2023-11-13 NOTE — ED Provider Notes (Signed)
[facility]    CSN: [id number] Arrival date & time: 11/09/23  1100      History   Chief Complaint Chief Complaint  Patient presents with   Otalgia    HPI Destiny Petty is a 42 y.o. female.   Patient presenting today with 3-day history of nasal congestion, chest congestion, productive cough, chest tightness, shortness of breath, ear pain.  Denies fever, chills, chest pain, shortness of breath, abdominal pain, nausea vomiting or diarrhea.  History of asthma, cigarette smoking on maintenance inhaler, albuterol as needed.  Taking over-the-counter cold and congestion medication with minimal relief.    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 Scheduled for Cholecystectomy   GERD (gastroesophageal reflux disease)    Hemorrhoids 02/10/2015   Hiatal hernia    Hyperlipidemia 04/09/2011   Hypertension    no meds   Injury of digital nerve of right little finger 08/23/2020   Laceration of blood vessel of right little finger 08/23/2020   Laceration of finger of left hand without damage to nail    left small finger   Menorrhagia with regular cycle 04/10/2016   Migraine 01/15/2016   Overview:  Overview:  IMO Load 2016 R1.3   Muscle spasm of back 03/13/2016   Numbness and tingling in left hand 08/19/2016   Obesity    Ovarian cyst 12/18/2016   Perianal wart 02/10/2015   Stress 02/14/2016   Weight loss counseling, encounter for  03/13/2016    Patient Active Problem List   Diagnosis Date Noted   Encounter for Papanicolaou smear of cervix 03/21/2023   Loss of taste 02/25/2023   Acute pain of right shoulder 02/25/2023   Prolapsed internal hemorrhoids, grade 3    DOE (dyspnea on exertion) 03/26/2021   Cigarette smoker 03/26/2021   Environmental and seasonal allergies 07/28/2020   Arthritis of left knee 06/01/2020   Encounter for general adult medical examination with abnormal findings 06/01/2020   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   Back pain with right-sided sciatica 06/18/2016   Morbid obesity due to excess calories (HCC) 03/13/2016   Migraine 01/15/2016   Recurrent major depressive disorder (HCC) 01/15/2016   Hemorrhoid 02/10/2015   Tobacco abuse 07/10/2013   GERD (gastroesophageal reflux disease) 07/10/2013   Essential hypertension 07/08/2013   Asthma, mild persistent 04/05/2013   Orthopnea 12/18/2011   Hyperlipidemia 04/09/2011    Past Surgical History:  Procedure Laterality Date   ABLATION     CESAREAN SECTION     CESAREAN SECTION N/A    Phreesia 05/23/2020   CHOLECYSTECTOMY     COLONOSCOPY     DILATATION AND CURETTAGE/HYSTEROSCOPY WITH MINERVA N/A 08/31/2019   Procedure: DILATATION AND CURETTAGE (no specimen) /HYSTEROSCOPY WITH MINERVA ENDOMETRIAL ABLATION;  Surgeon: Tilda Burrow, MD;  Location: [facility];  Service: Gynecology;  Laterality: N/A;   ESOPHAGOGASTRODUODENOSCOPY ENDOSCOPY  HEMORRHOID SURGERY N/A 05/10/2022   Procedure: HEMORRHOIDECTOMY, EXTENSIVE;  Surgeon: Franky Macho, MD;  Location: [facility];  Service: General;  Laterality: N/A;   TENDON EXPLORATION Left 08/10/2020   Procedure: LEFT SMALL FINER EXPLORATION, REPAIR OF TENDON, ARTERY, NERVE;  Surgeon: Betha Loa, MD;  Location: [facility];  Service: Orthopedics;  Laterality: Left;   TUBAL LIGATION      OB History     Gravida  2   Para  2   Term  2   Preterm      AB      Living  2       SAB      IAB      Ectopic      Multiple      Live Births  2            Home Medications    Prior to Admission medications   Medication Sig Start Date End Date Taking? Authorizing Provider  amoxicillin (AMOXIL) 875 MG tablet Take 1 tablet (875 mg total) by mouth 2 (two) times daily. 11/09/23  Yes Particia Nearing, PA-C  fluticasone [facility]) 50 MCG/ACT nasal spray Place 1 spray into both nostrils 2 (two) times daily. 11/09/23  Yes Particia Nearing, PA-C  ondansetron (ZOFRAN) 4 MG tablet Take 1 tablet (4 mg total) by mouth every 8 (eight) hours as needed for nausea or vomiting. 10/20/23   Anabel Halon, MD  promethazine-dextromethorphan (PROMETHAZINE-DM) 6.25-15 MG/5ML syrup Take 5 mLs by mouth 4 (four) times daily as needed. 11/09/23  Yes Particia Nearing, PA-C  albuterol (VENTOLIN HFA) 108 (90 Base) MCG/ACT inhaler Inhale 2 puffs into the lungs every 6 (six) hours as needed for wheezing or shortness of breath. 07/16/23   Leath-Warren, Sadie Haber, NP  amLODipine (NORVASC) 5 MG tablet Take 1 tablet (5 mg total) by mouth daily. 08/29/23   Anabel Halon, MD  buPROPion (WELLBUTRIN XL) 300 MG 24 hr tablet Take 1 tablet (300 mg total) by mouth daily. 12/12/22   Anabel Halon, MD  cetirizine (ZYRTEC) 10 MG tablet Take 1 tablet (10 mg total) by mouth daily. 03/21/23   Del Nigel Berthold, FNP  famotidine (PEPCID) 20 MG tablet One after supper 09/23/22   Anabel Halon, MD  fluticasone [facility]) 50 MCG/ACT nasal spray Place 2 sprays into both nostrils daily. 07/16/23   Leath-Warren, Sadie Haber, NP  levocetirizine (XYZAL) 5 MG tablet Take 1 tablet (5 mg total) by mouth every evening. Patient not taking: Reported on 04/30/2023 02/10/22   Wallis Bamberg, PA-C  meloxicam (MOBIC) 7.5 MG tablet Take 1 tablet (7.5 mg total) by mouth daily. Patient not taking: Reported on 04/30/2023 02/25/23   Anabel Halon, MD  promethazine-dextromethorphan (PROMETHAZINE-DM) 6.25-15 MG/5ML syrup Take 5  mLs by mouth 4 (four) times daily as needed. 07/16/23   Leath-Warren, Sadie Haber, NP  rizatriptan (MAXALT-MLT) 10 MG disintegrating tablet TAKE 1 TABLET BY MOUTH ONCE AS NEEDED FOR MIGRAINE. MAY REPEAT IN 2 HOURS IF NEEDED. 03/21/23   Del Nigel Berthold, FNP  Semaglutide-Weight Management (WEGOVY) 1 MG/0.5ML SOAJ Inject 1 mg into the skin every 7 (seven) days. 10/22/23   Anabel Halon, MD  Tiotropium Bromide-Olodaterol 2.5-2.5 MCG/ACT AERS Inhale 2 puffs into the lungs daily. Patient not taking: Reported on 04/30/2023 03/21/23   Del Nigel Berthold, FNP  traMADol (ULTRAM) 50 MG tablet Take by mouth. Patient not taking: Reported on 04/30/2023 08/23/20   [provider]  Family History Family History  Problem Relation Age of Onset   Diabetes Mother    Kidney failure Mother    Early death Mother 17       diabetes complications   Cancer Father 5       colon cancer   Depression Sister    Hypertension Sister    Anxiety disorder Sister    Other Sister        back problems   ADD / ADHD Daughter    Other Daughter        behavioral issues   ODD Daughter    Asthma Son    Cancer Maternal Grandmother        breast   Seizures Maternal Grandmother    Lupus Other    Breast cancer Maternal Aunt     Social History Social History   Tobacco Use   Smoking status: Every Day    Current packs/day: 1.00    Average packs/day: 1 pack/day for 26.0 years (26.0 ttl pk-yrs)    Types: Cigarettes   Smokeless tobacco: Never   Tobacco comments:    1/4 ppd 05/30/21  Vaping Use   Vaping status: Never Used  Substance Use Topics   Alcohol use: Yes    Comment: occ   Drug use: No     Allergies   Bee venom, Latex, and Lisinopril   Review of Systems Review of Systems PER HPI  Physical Exam Triage Vital Signs ED Triage Vitals  Encounter Vitals Group     BP 11/09/23 1117 (!) 143/97     Systolic BP Percentile --      Diastolic BP Percentile --      Pulse Rate 11/09/23 1117  100     Resp 11/09/23 1117 18     Temp 11/09/23 1117 (!) 97.5 F (36.4 C)     Temp src --      SpO2 11/09/23 1117 96 %     Weight --      Height --      Head Circumference --      Peak Flow --      Pain Score 11/09/23 1116 10     Pain Loc --      Pain Education --      Exclude from Growth Chart --    No data found.  Updated Vital Signs BP (!) 143/97 (BP Location: Right Arm)   Pulse 100   Temp (!) 97.5 F (36.4 C)   Resp 18   SpO2 96%   Visual Acuity Right Eye Distance:   Left Eye Distance:   Bilateral Distance:    Right Eye Near:   Left Eye Near:    Bilateral Near:     Physical Exam Vitals and nursing note reviewed.  Constitutional:      Appearance: Normal appearance.  HENT:     Head: Atraumatic.     Right Ear: External ear normal.     Left Ear: Tympanic membrane and external ear normal.     Ears:     Comments: Right TM erythematous and edematous    Nose: Congestion present.     Mouth/Throat:     Mouth: Mucous membranes are moist.     Pharynx: Posterior oropharyngeal erythema present.  Eyes:     Extraocular Movements: Extraocular movements intact.     Conjunctiva/sclera: Conjunctivae normal.  Cardiovascular:     Rate and Rhythm: Normal rate and regular rhythm.     Heart sounds: Normal heart sounds.  Pulmonary:     Effort: Pulmonary effort is normal.     Breath sounds: Wheezing present. No rales.  Musculoskeletal:        General: Normal range of motion.     Cervical back: Normal range of motion and neck supple.  Skin:    General: Skin is warm and dry.  Neurological:     Mental Status: She is alert and oriented to person, place, and time.  Psychiatric:        Mood and Affect: Mood normal.        Thought Content: Thought content normal.      UC Treatments / Results  Labs (all labs ordered are listed, but only abnormal results are displayed) Labs Reviewed - No data to display  EKG   Radiology No results found.  Procedures Procedures  (including critical care time)  Medications Ordered in UC Medications - No data to display  Initial Impression / Assessment and Plan / UC Course  I have reviewed the triage vital signs and the nursing notes.  Pertinent labs & imaging results that were available during my care of the patient were reviewed by me and considered in my medical decision making (see chart for details).     Suspect viral respiratory infection causing an asthma exacerbation.  Treat with ongoing inhaler regimen, Phenergan DM, Flonase, and amoxicillin for right ear infection.  Turn for worsening symptoms. Final Clinical Impressions(s) / UC Diagnoses   Final diagnoses:  Viral URI with cough  Wheezing  Acute suppurative otitis media of right ear without spontaneous rupture of tympanic membrane, recurrence not specified  Moderate persistent asthma with acute exacerbation   Discharge Instructions   None    ED Prescriptions     Medication Sig Dispense Auth. Provider   amoxicillin (AMOXIL) 875 MG tablet Take 1 tablet (875 mg total) by mouth 2 (two) times daily. 20 tablet Particia Nearing, [place]   promethazine-dextromethorphan (PROMETHAZINE-DM) 6.25-15 MG/5ML syrup Take 5 mLs by mouth 4 (four) times daily as needed. 100 mL Particia Nearing, PA-C   fluticasone [facility]) 50 MCG/ACT nasal spray Place 1 spray into both nostrils 2 (two) times daily. 16 g Particia Nearing, [place]      PDMP not reviewed this encounter.   Particia Nearing, [place] 11/13/23 [phone]

## 2023-11-14 ENCOUNTER — Telehealth: Payer: Self-pay

## 2023-11-14 NOTE — Telephone Encounter (Signed)
Copied from CRM [id number]. Topic: Clinical - Medication Question >> Nov 14, 2023 12:56 PM Dominique A wrote: Reason for CRM: Pt is requesting a strong medication for nausea. She is currently taking ondansetron (ZOFRAN) 4 MG tablet and would like it sent over to [facility].

## 2023-11-20 ENCOUNTER — Other Ambulatory Visit: Payer: Self-pay | Admitting: Family Medicine

## 2023-11-20 ENCOUNTER — Telehealth: Payer: Self-pay

## 2023-11-20 DIAGNOSIS — K219 Gastro-esophageal reflux disease without esophagitis: Secondary | ICD-10-CM

## 2023-11-20 DIAGNOSIS — R110 Nausea: Secondary | ICD-10-CM

## 2023-11-20 MED ORDER — SEMAGLUTIDE-WEIGHT MANAGEMENT 1.7 MG/0.75ML ~~LOC~~ SOAJ: 1.7000 mg | SUBCUTANEOUS | 0 refills | Status: DC

## 2023-11-20 MED ORDER — PANTOPRAZOLE SODIUM 40 MG PO TBEC
40.0000 mg | DELAYED_RELEASE_TABLET | Freq: Every day | ORAL | 2 refills | Status: DC
Start: 2023-11-20 — End: 2024-06-18

## 2023-11-20 MED ORDER — ONDANSETRON HCL 4 MG PO TABS
4.0000 mg | ORAL_TABLET | Freq: Three times a day (TID) | ORAL | 0 refills | Status: DC | PRN
Start: 2023-11-20 — End: 2024-10-18

## 2023-11-20 NOTE — Telephone Encounter (Signed)
Copied from CRM [id number]. Topic: Clinical - Medication Refill >> Nov 20, 2023  3:14 PM Fonda Kinder J wrote: Most Recent Primary Care Visit:  Provider: Anabel Halon  Department: [facility]  Visit Type: OFFICE VISIT  Date: 08/29/2023  Medication: Semaglutide-Weight Management (WEGOVY) 1 MG/0.5ML SOAJ  Has the patient contacted their pharmacy? Yes (Agent: If no, request that the patient contact the pharmacy for the refill. If patient does not wish to contact the pharmacy document the reason why and proceed with request.) (Agent: If yes, when and what did the pharmacy advise?) Contact PCP  Is this the correct pharmacy for this prescription? Yes If no, delete pharmacy and type the correct one.  This is the patient's preferred pharmacy:  [facility] - [place], [place] - [address] [place] [address] Phone: [phone] Fax: [phone]    Has the prescription been filled recently? No  Is the patient out of the medication? Yes  Has the patient been seen for an appointment in the last year OR does the patient have an upcoming appointment? Yes  Can we respond through MyChart? No  Agent: Please be advised that Rx refills may take up to 3 business days. We ask that you follow-up with your pharmacy.

## 2023-11-20 NOTE — Telephone Encounter (Signed)
Copied from CRM [id number]. Topic: Clinical - Medication Question >> Nov 20, 2023  3:11 PM Fonda Kinder J wrote: Reason for CRM: Pt is calling in to request medication for extreme nausea and heartburn from weight loss medication. She was prescribed something for nausea but would like for the dosage to be up and she was never prescribed anything for heartburn. Pt is requesting a callback to speak with someone

## 2023-11-21 NOTE — Telephone Encounter (Signed)
Patient advised.

## 2023-12-01 ENCOUNTER — Ambulatory Visit: Payer: Medicaid Other | Admitting: Internal Medicine

## 2023-12-01 ENCOUNTER — Encounter: Payer: Self-pay | Admitting: Internal Medicine

## 2023-12-01 VITALS — BP 146/74 | HR 104 | Ht 60.0 in | Wt 190.6 lb

## 2023-12-01 DIAGNOSIS — J4530 Mild persistent asthma, uncomplicated: Secondary | ICD-10-CM

## 2023-12-01 DIAGNOSIS — E782 Mixed hyperlipidemia: Secondary | ICD-10-CM | POA: Diagnosis not present

## 2023-12-01 DIAGNOSIS — R739 Hyperglycemia, unspecified: Secondary | ICD-10-CM

## 2023-12-01 DIAGNOSIS — G43719 Chronic migraine without aura, intractable, without status migrainosus: Secondary | ICD-10-CM | POA: Diagnosis not present

## 2023-12-01 DIAGNOSIS — Z720 Tobacco use: Secondary | ICD-10-CM | POA: Diagnosis not present

## 2023-12-01 DIAGNOSIS — K219 Gastro-esophageal reflux disease without esophagitis: Secondary | ICD-10-CM | POA: Diagnosis not present

## 2023-12-01 DIAGNOSIS — I10 Essential (primary) hypertension: Secondary | ICD-10-CM | POA: Diagnosis not present

## 2023-12-01 DIAGNOSIS — F331 Major depressive disorder, recurrent, moderate: Secondary | ICD-10-CM | POA: Diagnosis not present

## 2023-12-01 MED ORDER — AMLODIPINE BESYLATE 10 MG PO TABS: 10.0000 mg | ORAL_TABLET | Freq: Every day | ORAL | 3 refills | Status: DC

## 2023-12-01 MED ORDER — RIZATRIPTAN BENZOATE 10 MG PO TBDP: ORAL_TABLET | ORAL | 2 refills | Status: DC

## 2023-12-01 NOTE — Assessment & Plan Note (Addendum)
BMI Readings from Last 3 Encounters:  12/01/23 37.22 kg/m  08/29/23 40.66 kg/m  05/29/23 43.12 kg/m   Has tried low-carb diet and exercise regimen Has been on phentermine - lost about 12 lbs in the past She is a good candidate for GLP-1 agonist Now on Wegovy -initial BMI: 40.66, plan to increase dose as tolerated Had advised to think about bariatric surgery referral in the past   Advised to follow-up low-carb diet, do portion control and guided to use other resources to adhere to the diet such as apps ([employer], healthi)

## 2023-12-01 NOTE — Assessment & Plan Note (Signed)
Well-controlled with PRN Maxalt, refilled If frequent episodes of migraine, will add topiramate - has tried it before

## 2023-12-01 NOTE — Patient Instructions (Addendum)
Please start taking Amlodipine 10 mg once daily instead of 5 mg.  Please continue to take other medications as prescribed.  Please continue to follow low carb diet and perform moderate exercise/walking at least 150 mins/week.

## 2023-12-01 NOTE — Progress Notes (Signed)
Established Patient Office Visit  Subjective:  Patient ID: Destiny Petty, female    DOB: August 31, 1981  Age: 42 y.o. MRN: [id number]  CC:  Chief Complaint  Patient presents with   Weight Loss    Three month follow up     HPI Destiny Petty is a 42 y.o. female with past medical history of asthma, GERD, migraine, HLD, depression and morbid obesity who presents for f/u of her chronic medical conditions.  HTN: Her BP was elevated BP today.  She has been taking amlodipine 5 mg QD regularly. She denies any chest pain or palpitations.  Migraine: She complains of intermittent episodes of headache, which is sharp, nonradiating, and is associated with severe nausea.  She has Maxalt as needed, which improves her headache  Asthma:  She uses Stiolto as maintenance inhaler and Albuterol PRN for dyspnea or wheezing.  Denies any fever or chills.  She takes Xyzal for allergies.  Obesity: She has responded well to [facility] and has lost about 18 lbs since the last visit. She recently started taking Wegovy 1.7 mg dose in the last week.  She is trying to cut down soft drinks and has been following low-carb diet.  She complained of chronic fatigue due to recent weight gain in the past, which is improving.  She had worsening of acid reflux and nausea with Wegovy, but has improved with pantoprazole.  MDD:  She has stopped taking Wellbutrin, which was started to help her quit smoking.  She still smokes, but reports that she has not been smoking on a daily basis.  Denies any SI or HI currently.  She attributed her mood symptoms to weight gain in the past, but now feels better with weight loss.     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 Scheduled for Cholecystectomy   GERD (gastroesophageal reflux disease)    Hemorrhoids 02/10/2015   Hiatal hernia    Hyperlipidemia 04/09/2011   Hypertension    no meds   Injury of digital nerve of right little finger 08/23/2020   Laceration of blood vessel of right little finger 08/23/2020   Laceration of finger of left hand without damage to nail    left small finger   Menorrhagia with regular cycle 04/10/2016   Migraine 01/15/2016   Overview:  Overview:  IMO Load 2016 R1.3   Muscle spasm of back 03/13/2016   Numbness and tingling in left hand 08/19/2016   Obesity    Ovarian cyst 12/18/2016   Perianal wart 02/10/2015   Stress 02/14/2016   Weight loss counseling, encounter for 03/13/2016    Past Surgical History:  Procedure Laterality Date   ABLATION     CESAREAN SECTION     CESAREAN SECTION N/A    Phreesia 05/23/2020   CHOLECYSTECTOMY     COLONOSCOPY     DILATATION AND CURETTAGE/HYSTEROSCOPY WITH MINERVA  N/A 08/31/2019   Procedure: DILATATION AND CURETTAGE (no specimen) /HYSTEROSCOPY WITH MINERVA ENDOMETRIAL ABLATION;  Surgeon: Tilda Burrow, MD;  Location: [facility];  Service: Gynecology;  Laterality: N/A;   ESOPHAGOGASTRODUODENOSCOPY ENDOSCOPY     HEMORRHOID SURGERY N/A 05/10/2022   Procedure: HEMORRHOIDECTOMY, EXTENSIVE;  Surgeon: Franky Macho, MD;  Location: [facility];  Service: General;  Laterality: N/A;   TENDON EXPLORATION Left 08/10/2020   Procedure: LEFT SMALL FINER EXPLORATION, REPAIR OF TENDON, ARTERY, NERVE;  Surgeon: Betha Loa, MD;  Location: [facility];  Service: Orthopedics;  Laterality: Left;   TUBAL LIGATION      Family History  Problem Relation Age of Onset    Diabetes Mother    Kidney failure Mother    Early death Mother 53       diabetes complications   Cancer Father 34       colon cancer   Depression Sister    Hypertension Sister    Anxiety disorder Sister    Other Sister        back problems   ADD / ADHD Daughter    Other Daughter        behavioral issues   ODD Daughter    Asthma Son    Cancer Maternal Grandmother        breast   Seizures Maternal Grandmother    Lupus Other    Breast cancer Maternal Aunt     Social History   Socioeconomic History   Marital status: Legally Separated    Spouse name: Not on file   Number of children: 2   Years of education: 14   Highest education level: Not on file  Occupational History   Not on file  Tobacco Use   Smoking status: Every Day    Current packs/day: 1.00    Average packs/day: 1 pack/day for 26.0 years (26.0 ttl pk-yrs)    Types: Cigarettes   Smokeless tobacco: Never   Tobacco comments:    1/4 ppd 05/30/21  Vaping Use   Vaping status: Never Used  Substance and Sexual Activity   Alcohol use: Yes    Comment: occ   Drug use: No   Sexual activity: Not Currently    Birth control/protection: Surgical    Comment: tubal  Other Topics Concern   Not on file  Social History Narrative   Legally separated right now   2 children: son 36 and daughter 41 lives with her      2 dogs: macho, mary jane   2 birds:    1 cats: boghgie       Enjoys: spending time with kids, outside things      Diet: eat all food groups    Caffeine: coffee and soda daily   Water: 4 cups daily      Wears seat belt   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   No weapons        Social Drivers of Health   Financial Resource Strain: Low Risk  (06/01/2020)   Overall Financial Resource Strain (CARDIA)    Difficulty of Paying Living Expenses: Not hard at all  Food Insecurity: No Food Insecurity (06/01/2020)   Hunger Vital Sign    Worried About Running Out of Food in the Last Year: Never  true    Ran Out of Food in the Last Year: Never true  Transportation Needs: No Transportation Needs (06/01/2020)   PRAPARE - Transportation    Lack of Transportation (  Medical): No    Lack of Transportation (Non-Medical): No  Physical Activity: Insufficiently Active (06/01/2020)   Exercise Vital Sign    Days of Exercise per Week: 2 days    Minutes of Exercise per Session: 30 min  Stress: No Stress Concern Present (06/01/2020)   [employer] of Occupational Health - Occupational Stress Questionnaire    Feeling of Stress : Not at all  Social Connections: Moderately Isolated (06/01/2020)   Social Connection and Isolation Panel [NHANES]    Frequency of Communication with Friends and Family: More than three times a week    Frequency of Social Gatherings with Friends and Family: More than three times a week    Attends Religious Services: More than 4 times per year    Active Member of [employer] or Organizations: No    Attends Banker Meetings: Never    Marital Status: Separated  Intimate Partner Violence: Not At Risk (06/01/2020)   Humiliation, Afraid, Rape, and Kick questionnaire    Fear of Current or Ex-Partner: No    Emotionally Abused: No    Physically Abused: No    Sexually Abused: No    Outpatient Medications Prior to Visit  Medication Sig Dispense Refill   albuterol (VENTOLIN HFA) 108 (90 Base) MCG/ACT inhaler Inhale 2 puffs into the lungs every 6 (six) hours as needed for wheezing or shortness of breath. 8 g 0   cetirizine (ZYRTEC) 10 MG tablet Take 1 tablet (10 mg total) by mouth daily. 30 tablet 0   fluticasone (FLONASE) 50 MCG/ACT nasal spray Place 1 spray into both nostrils 2 (two) times daily. 16 g 2   meloxicam (MOBIC) 7.5 MG tablet Take 1 tablet (7.5 mg total) by mouth daily. (Patient not taking: Reported on 04/30/2023) 30 tablet 0   ondansetron (ZOFRAN) 4 MG tablet Take 1 tablet (4 mg total) by mouth every 8 (eight) hours as needed for nausea or vomiting. 20 tablet  0   pantoprazole (PROTONIX) 40 MG tablet Take 1 tablet (40 mg total) by mouth daily. 30 tablet 2   Semaglutide-Weight Management 1.7 MG/0.75ML SOAJ Inject 1.7 mg into the skin once a week. 3 mL 0   Tiotropium Bromide-Olodaterol 2.5-2.5 MCG/ACT AERS Inhale 2 puffs into the lungs daily. (Patient not taking: Reported on 04/30/2023) 4 g 5   amLODipine (NORVASC) 5 MG tablet Take 1 tablet (5 mg total) by mouth daily. 90 tablet 1   amoxicillin (AMOXIL) 875 MG tablet Take 1 tablet (875 mg total) by mouth 2 (two) times daily. 20 tablet 0   buPROPion (WELLBUTRIN XL) 300 MG 24 hr tablet Take 1 tablet (300 mg total) by mouth daily. 90 tablet 1   famotidine (PEPCID) 20 MG tablet One after supper 90 tablet 3   fluticasone (FLONASE) 50 MCG/ACT nasal spray Place 2 sprays into both nostrils daily. 16 g 0   levocetirizine (XYZAL) 5 MG tablet Take 1 tablet (5 mg total) by mouth every evening. (Patient not taking: Reported on 04/30/2023) 90 tablet 0   promethazine-dextromethorphan (PROMETHAZINE-DM) 6.25-15 MG/5ML syrup Take 5 mLs by mouth 4 (four) times daily as needed. 118 mL 0   promethazine-dextromethorphan (PROMETHAZINE-DM) 6.25-15 MG/5ML syrup Take 5 mLs by mouth 4 (four) times daily as needed. 100 mL 0   rizatriptan (MAXALT-MLT) 10 MG disintegrating tablet TAKE 1 TABLET BY MOUTH ONCE AS NEEDED FOR MIGRAINE. MAY REPEAT IN 2 HOURS IF NEEDED. 10 tablet 2   traMADol (ULTRAM) 50 MG tablet Take by mouth. (Patient not taking: Reported on  04/30/2023)     No facility-administered medications prior to visit.    Allergies  Allergen Reactions   Bee Venom Shortness Of Breath   Latex Itching   Lisinopril Cough    ROS Review of Systems  Constitutional:  Positive for fatigue. Negative for chills and fever.  HENT:  Negative for congestion, sinus pressure and sore throat.   Eyes:  Negative for pain and discharge.  Respiratory:  Negative for cough and shortness of breath.   Cardiovascular:  Negative for chest pain and  palpitations.  Gastrointestinal:  Negative for abdominal pain, diarrhea, nausea and vomiting.  Endocrine: Negative for polydipsia and polyuria.  Genitourinary:  Negative for dysuria and hematuria.  Musculoskeletal:  Negative for neck pain and neck stiffness.  Skin:  Negative for rash.  Neurological:  Positive for headaches. Negative for dizziness and weakness.  Psychiatric/Behavioral:  Negative for agitation and behavioral problems. The patient is nervous/anxious.       Objective:    Physical Exam Vitals reviewed.  Constitutional:      General: She is not in acute distress.    Appearance: She is obese. She is not diaphoretic.  HENT:     Head: Normocephalic and atraumatic.     Nose: No congestion.     Mouth/Throat:     Mouth: Mucous membranes are moist.     Pharynx: No posterior oropharyngeal erythema.  Eyes:     General: No scleral icterus.    Extraocular Movements: Extraocular movements intact.  Cardiovascular:     Rate and Rhythm: Normal rate and regular rhythm.     Pulses: Normal pulses.     Heart sounds: Normal heart sounds. No murmur heard. Pulmonary:     Breath sounds: Normal breath sounds. No wheezing or rales.  Musculoskeletal:     Cervical back: Neck supple. No tenderness.     Right lower leg: No edema.     Left lower leg: No edema.  Skin:    General: Skin is warm.     Findings: No rash.  Neurological:     General: No focal deficit present.     Mental Status: She is alert and oriented to person, place, and time.  Psychiatric:        Mood and Affect: Mood normal.        Behavior: Behavior normal.     BP (!) 146/74 (BP Location: Right Arm)   Pulse (!) 104   Ht 5' (1.524 m)   Wt 190 lb 9.6 oz (86.5 kg)   SpO2 94%   BMI 37.22 kg/m  Wt Readings from Last 3 Encounters:  12/01/23 190 lb 9.6 oz (86.5 kg)  08/29/23 208 lb 3.2 oz (94.4 kg)  05/29/23 220 lb 12.8 oz (100.2 kg)    Lab Results  Component Value Date   TSH 2.830 01/13/2023   Lab Results   Component Value Date   WBC 8.7 01/13/2023   HGB 15.1 01/13/2023   HCT 44.1 01/13/2023   MCV 97 01/13/2023   PLT 250 01/13/2023   Lab Results  Component Value Date   NA 141 01/13/2023   K 3.9 01/13/2023   CO2 18 (L) 01/13/2023   GLUCOSE 83 01/13/2023   BUN 10 01/13/2023   CREATININE 0.80 01/13/2023   BILITOT 0.4 01/13/2023   ALKPHOS 89 01/13/2023   AST 18 01/13/2023   ALT 18 01/13/2023   PROT 6.6 01/13/2023   ALBUMIN 4.4 01/13/2023   CALCIUM 9.3 01/13/2023   ANIONGAP 8 05/08/2022   EGFR  94 01/13/2023   Lab Results  Component Value Date   CHOL 189 01/13/2023   Lab Results  Component Value Date   HDL 46 01/13/2023   Lab Results  Component Value Date   LDLCALC 103 (H) 01/13/2023   Lab Results  Component Value Date   TRIG 234 (H) 01/13/2023   Lab Results  Component Value Date   CHOLHDL 4.1 01/13/2023   Lab Results  Component Value Date   HGBA1C 5.3 01/13/2023      Assessment & Plan:   Problem List Items Addressed This Visit       Cardiovascular and Mediastinum   Essential hypertension - Primary   BP Readings from Last 1 Encounters:  12/01/23 (!) 144/78   Uncontrolled with amlodipine 5 mg QD Increased dose of amlodipine to 10 mg QD Counseled for compliance with the medications Advised DASH diet and moderate exercise/walking, at least 150 mins/week      Relevant Medications   amLODipine (NORVASC) 10 MG tablet   Other Relevant Orders   CMP14+EGFR   Migraine   Well-controlled with PRN Maxalt, refilled If frequent episodes of migraine, will add topiramate - has tried it before      Relevant Medications   amLODipine (NORVASC) 10 MG tablet   rizatriptan (MAXALT-MLT) 10 MG disintegrating tablet     Respiratory   Asthma, mild persistent   Well-controlled with Stiolto and albuterol as needed Has seen pulmonology        Digestive   GERD (gastroesophageal reflux disease)   Well controlled with pantoprazole now Had persistent acid reflux while  taking Pepcid        Other   Morbid obesity due to excess calories (HCC) (Chronic)   BMI Readings from Last 3 Encounters:  12/01/23 37.22 kg/m  08/29/23 40.66 kg/m  05/29/23 43.12 kg/m   Has tried low-carb diet and exercise regimen Has been on phentermine - lost about 12 lbs in the past She is a good candidate for GLP-1 agonist Now on Wegovy -initial BMI: 40.66, plan to increase dose as tolerated Had advised to think about bariatric surgery referral in the past   Advised to follow-up low-carb diet, do portion control and guided to use other resources to adhere to the diet such as apps (Noom, healthi)      Recurrent major depressive disorder (HCC) (Chronic)   Overall well-controlled currently, improving with weight loss Would avoid any antidepressant for now Has tried Prozac, BuSpar and Wellbutrin      Tobacco abuse   Smokes about 0.25 pack/day, states she does not smoke daily now  Asked about quitting: confirms that he/she currently smokes cigarettes Advise to quit smoking: Educated about QUITTING to reduce the risk of cancer, cardio and cerebrovascular disease. Assess willingness: Unwilling to quit at this time, but is working on cutting back. Assist with counseling and pharmacotherapy: Counseled for 5 minutes and literature provided.  Has tried Wellbutrin. Arrange for follow up: follow up in 3 months and continue to offer help.      Hyperlipidemia   Last lipid profile reviewed DCed Fenofibrate to reduce pill burden Check lipid profile      Relevant Medications   amLODipine (NORVASC) 10 MG tablet   Other Relevant Orders   Lipid Profile   Other Visit Diagnoses       Hyperglycemia       Relevant Orders   Hemoglobin A1c        Meds ordered this encounter  Medications   amLODipine (NORVASC) 10  MG tablet    Sig: Take 1 tablet (10 mg total) by mouth daily.    Dispense:  90 tablet    Refill:  3   rizatriptan (MAXALT-MLT) 10 MG disintegrating tablet     Sig: TAKE 1 TABLET BY MOUTH ONCE AS NEEDED FOR MIGRAINE. MAY REPEAT IN 2 HOURS IF NEEDED.    Dispense:  10 tablet    Refill:  2    Follow-up: Return in about 3 months (around 02/29/2024) for Annual physical (after 02/25/24).    Anabel Halon, MD

## 2023-12-01 NOTE — Assessment & Plan Note (Addendum)
Smokes about 0.25 pack/day, states she does not smoke daily now  Asked about quitting: confirms that he/she currently smokes cigarettes Advise to quit smoking: Educated about QUITTING to reduce the risk of cancer, cardio and cerebrovascular disease. Assess willingness: Unwilling to quit at this time, but is working on cutting back. Assist with counseling and pharmacotherapy: Counseled for 5 minutes and literature provided.  Has tried Wellbutrin. Arrange for follow up: follow up in 3 months and continue to offer help.

## 2023-12-01 NOTE — Assessment & Plan Note (Addendum)
BP Readings from Last 1 Encounters:  12/01/23 (!) 144/78   Uncontrolled with amlodipine 5 mg QD Increased dose of amlodipine to 10 mg QD Counseled for compliance with the medications Advised DASH diet and moderate exercise/walking, at least 150 mins/week

## 2023-12-01 NOTE — Assessment & Plan Note (Signed)
Well controlled with pantoprazole now Had persistent acid reflux while taking Pepcid

## 2023-12-01 NOTE — Assessment & Plan Note (Signed)
Overall well-controlled currently, improving with weight loss Would avoid any antidepressant for now Has tried Prozac, BuSpar and Wellbutrin

## 2023-12-01 NOTE — Assessment & Plan Note (Signed)
Last lipid profile reviewed DCed Fenofibrate to reduce pill burden Check lipid profile

## 2023-12-01 NOTE — Assessment & Plan Note (Signed)
Well-controlled with Stiolto and albuterol as needed Has seen pulmonology 

## 2023-12-02 ENCOUNTER — Ambulatory Visit
Admission: EM | Admit: 2023-12-02 | Discharge: 2023-12-02 | Disposition: A | Discharge status: Home / Self Care | Payer: Medicaid Other | Attending: Family Medicine | Admitting: Family Medicine

## 2023-12-02 DIAGNOSIS — J4541 Moderate persistent asthma with (acute) exacerbation: Secondary | ICD-10-CM

## 2023-12-02 DIAGNOSIS — R062 Wheezing: Secondary | ICD-10-CM

## 2023-12-02 DIAGNOSIS — R109 Unspecified abdominal pain: Secondary | ICD-10-CM | POA: Diagnosis not present

## 2023-12-02 DIAGNOSIS — R0602 Shortness of breath: Secondary | ICD-10-CM

## 2023-12-02 LAB — POCT URINALYSIS DIP (MANUAL ENTRY)
Bilirubin, UA: NEGATIVE
Blood, UA: NEGATIVE
Glucose, UA: NEGATIVE mg/dL
Ketones, POC UA: NEGATIVE mg/dL
Leukocytes, UA: NEGATIVE
Nitrite, UA: NEGATIVE
Protein Ur, POC: NEGATIVE mg/dL
Spec Grav, UA: 1.025 (ref 1.010–1.025)
Urobilinogen, UA: 0.2 U/dL
pH, UA: 5.5 (ref 5.0–8.0)

## 2023-12-02 LAB — CMP14+EGFR
ALT: 9 [IU]/L (ref 0–32)
AST: 15 [IU]/L (ref 0–40)
Albumin: 4.1 g/dL (ref 3.9–4.9)
Alkaline Phosphatase: 126 [IU]/L — ABNORMAL HIGH (ref 44–121)
BUN/Creatinine Ratio: 13 (ref 9–23)
BUN: 12 mg/dL (ref 6–24)
Bilirubin Total: <0.2 mg/dL (ref 0.0–1.2)
CO2: 21 mmol/L (ref 20–29)
Calcium: 9.7 mg/dL (ref 8.7–10.2)
Chloride: 106 mmol/L (ref 96–106)
Creatinine, Ser: 0.89 mg/dL (ref 0.57–1.00)
Globulin, Total: 2.1 g/dL (ref 1.5–4.5)
Glucose: 73 mg/dL (ref 70–99)
Potassium: 5.1 mmol/L (ref 3.5–5.2)
Sodium: 143 mmol/L (ref 134–144)
Total Protein: 6.2 g/dL (ref 6.0–8.5)
eGFR: 82 mL/min/{1.73_m2} (ref >59)

## 2023-12-02 LAB — LIPID PANEL
Chol/HDL Ratio: 4.9 {ratio} — ABNORMAL HIGH (ref 0.0–4.4)
Cholesterol, Total: 180 mg/dL (ref 100–199)
HDL: 37 mg/dL — ABNORMAL LOW (ref >39)
LDL Chol Calc (NIH): 110 mg/dL — ABNORMAL HIGH (ref 0–99)
Triglycerides: 186 mg/dL — ABNORMAL HIGH (ref 0–149)
VLDL Cholesterol Cal: 33 mg/dL (ref 5–40)

## 2023-12-02 LAB — HEMOGLOBIN A1C
Est. average glucose Bld gHb Est-mCnc: 108 mg/dL
Hgb A1c MFr Bld: 5.4 % (ref 4.8–5.6)

## 2023-12-02 MED ORDER — TIZANIDINE HCL 4 MG PO CAPS: 4.0000 mg | ORAL_CAPSULE | Freq: Three times a day (TID) | ORAL | 0 refills | Status: DC | PRN

## 2023-12-02 MED ORDER — DEXAMETHASONE SODIUM PHOSPHATE 10 MG/ML IJ SOLN: 10.0000 mg | Freq: Once | INTRAMUSCULAR | Status: DC

## 2023-12-02 NOTE — ED Provider Notes (Signed)
[facility]    CSN: [id number] Arrival date & time: 12/02/23  1306      History   Chief Complaint No chief complaint on file.   HPI Destiny Petty is a 42 y.o. female.   Patient presenting today with 3-day history of right flank pain extending to the right mid back.  States the pain is worse with movement or direct pressure applied to the area.  She denies injury to the area, radiation of pain down legs, bowel or bladder incontinence, saddle anesthesias, dysuria, hematuria, fever.  Also having ongoing issues with cough, shortness of breath, wheezing.  Was treated for lower respiratory infection and ear infection with antibiotics several weeks ago but states her symptoms seem to never resolved.  She does have a history of asthma on inhaler regimen consistently.  She states the inhalers help but not fully resolving the symptoms.    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 Scheduled for Cholecystectomy   GERD (gastroesophageal reflux disease)    Hemorrhoids 02/10/2015   Hiatal hernia    Hyperlipidemia 04/09/2011   Hypertension    no meds   Injury of digital nerve of right little finger 08/23/2020   Laceration of blood vessel of right little finger 08/23/2020   Laceration of finger of left hand without damage to nail    left small finger   Menorrhagia with regular cycle 04/10/2016   Migraine 01/15/2016   Overview:   Overview:  IMO Load 2016 R1.3   Muscle spasm of back 03/13/2016   Numbness and tingling in left hand 08/19/2016   Obesity    Ovarian cyst 12/18/2016   Perianal wart 02/10/2015   Stress 02/14/2016   Weight loss counseling, encounter for 03/13/2016    Patient Active Problem List   Diagnosis Date Noted   Encounter for Papanicolaou smear of cervix 03/21/2023   Loss of taste 02/25/2023   Acute pain of right shoulder 02/25/2023   Prolapsed internal hemorrhoids, grade 3    DOE (dyspnea on exertion) 03/26/2021   Cigarette smoker 03/26/2021   Environmental and seasonal allergies 07/28/2020   Arthritis of left knee 06/01/2020   Encounter for general adult medical examination with abnormal findings 06/01/2020   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   Back pain with right-sided sciatica 06/18/2016   Morbid obesity due to excess calories (HCC) 03/13/2016   Migraine 01/15/2016   Recurrent major depressive disorder (HCC) 01/15/2016   Hemorrhoid 02/10/2015   Tobacco abuse 07/10/2013   GERD (gastroesophageal reflux disease) 07/10/2013   Essential hypertension 07/08/2013   Asthma, mild persistent 04/05/2013   Orthopnea 12/18/2011   Hyperlipidemia 04/09/2011    Past Surgical History:  Procedure Laterality Date   ABLATION     CESAREAN SECTION     CESAREAN SECTION N/A    Phreesia 05/23/2020   CHOLECYSTECTOMY     COLONOSCOPY  DILATATION AND CURETTAGE/HYSTEROSCOPY WITH MINERVA N/A 08/31/2019   Procedure: DILATATION AND CURETTAGE (no specimen) /HYSTEROSCOPY WITH MINERVA ENDOMETRIAL ABLATION;  Surgeon: Tilda Burrow, MD;  Location: [facility];  Service: Gynecology;  Laterality: N/A;   ESOPHAGOGASTRODUODENOSCOPY ENDOSCOPY     HEMORRHOID SURGERY N/A 05/10/2022   Procedure: HEMORRHOIDECTOMY, EXTENSIVE;  Surgeon: Franky Macho, MD;  Location: [facility];  Service: General;  Laterality: N/A;   TENDON EXPLORATION Left 08/10/2020   Procedure: LEFT SMALL FINER EXPLORATION, REPAIR OF TENDON, ARTERY,  NERVE;  Surgeon: Betha Loa, MD;  Location: [facility];  Service: Orthopedics;  Laterality: Left;   TUBAL LIGATION      OB History     Gravida  2   Para  2   Term  2   Preterm      AB      Living  2      SAB      IAB      Ectopic      Multiple      Live Births  2            Home Medications    Prior to Admission medications   Medication Sig Start Date End Date Taking? Authorizing Provider  tiZANidine (ZANAFLEX) 4 MG capsule Take 1 capsule (4 mg total) by mouth 3 (three) times daily as needed for muscle spasms. Do not drink alcohol or drive while taking this medication.  May cause drowsiness. 12/02/23  Yes Particia Nearing, PA-C  albuterol (VENTOLIN HFA) 108 (90 Base) MCG/ACT inhaler Inhale 2 puffs into the lungs every 6 (six) hours as needed for wheezing or shortness of breath. 07/16/23   Leath-Warren, Sadie Haber, NP  amLODipine (NORVASC) 10 MG tablet Take 1 tablet (10 mg total) by mouth daily. 12/01/23   Anabel Halon, MD  cetirizine (ZYRTEC) 10 MG tablet Take 1 tablet (10 mg total) by mouth daily. 03/21/23   Del Nigel Berthold, FNP  fluticasone (FLONASE) 50 MCG/ACT nasal spray Place 1 spray into both nostrils 2 (two) times daily. 11/09/23   Particia Nearing, PA-C  meloxicam (MOBIC) 7.5 MG tablet Take 1 tablet (7.5 mg total) by mouth daily. Patient not taking: Reported on 04/30/2023 02/25/23   Anabel Halon, MD  ondansetron (ZOFRAN) 4 MG tablet Take 1 tablet (4 mg total) by mouth every 8 (eight) hours as needed for nausea or vomiting. 11/20/23   Billie Lade, MD  pantoprazole (PROTONIX) 40 MG tablet Take 1 tablet (40 mg total) by mouth daily. 11/20/23   Billie Lade, MD  rizatriptan (MAXALT-MLT) 10 MG disintegrating tablet TAKE 1 TABLET BY MOUTH ONCE AS NEEDED FOR MIGRAINE. MAY REPEAT IN 2 HOURS IF NEEDED. 12/01/23   Anabel Halon, MD  Semaglutide-Weight Management 1.7 MG/0.75ML SOAJ Inject 1.7 mg into the skin once a week.  11/20/23   Kerri Perches, MD  Tiotropium Bromide-Olodaterol 2.5-2.5 MCG/ACT AERS Inhale 2 puffs into the lungs daily. Patient not taking: Reported on 04/30/2023 03/21/23   Del Nigel Berthold, FNP    Family History Family History  Problem Relation Age of Onset   Diabetes Mother    Kidney failure Mother    Early death Mother 30       diabetes complications   Cancer Father 26       colon cancer   Depression Sister    Hypertension Sister    Anxiety disorder Sister    Other Sister  back problems   ADD / ADHD Daughter    Other Daughter        behavioral issues   ODD Daughter    Asthma Son    Cancer Maternal Grandmother        breast   Seizures Maternal Grandmother    Lupus Other    Breast cancer Maternal Aunt     Social History Social History   Tobacco Use   Smoking status: Every Day    Current packs/day: 1.00    Average packs/day: 1 pack/day for 26.0 years (26.0 ttl pk-yrs)    Types: Cigarettes   Smokeless tobacco: Never   Tobacco comments:    1/4 ppd 05/30/21  Vaping Use   Vaping status: Never Used  Substance Use Topics   Alcohol use: Yes    Comment: occ   Drug use: No     Allergies   Bee venom, Latex, and Lisinopril   Review of Systems Review of Systems Per HPI  Physical Exam Triage Vital Signs ED Triage Vitals  Encounter Vitals Group     BP 12/02/23 1419 (!) 127/92     Systolic BP Percentile --      Diastolic BP Percentile --      Pulse Rate 12/02/23 1419 79     Resp 12/02/23 1419 20     Temp 12/02/23 1419 98 F (36.7 C)     Temp Source 12/02/23 1419 Oral     SpO2 12/02/23 1419 97 %     Weight --      Height --      Head Circumference --      Peak Flow --      Pain Score 12/02/23 1418 10     Pain Loc --      Pain Education --      Exclude from Growth Chart --    No data found.  Updated Vital Signs BP (!) 127/92 (BP Location: Right Arm)   Pulse 79   Temp 98 F (36.7 C) (Oral)   Resp 20   SpO2 97%   Visual  Acuity Right Eye Distance:   Left Eye Distance:   Bilateral Distance:    Right Eye Near:   Left Eye Near:    Bilateral Near:     Physical Exam Vitals and nursing note reviewed.  Constitutional:      Appearance: Normal appearance.  HENT:     Head: Atraumatic.     Right Ear: Tympanic membrane and external ear normal.     Left Ear: Tympanic membrane and external ear normal.     Nose: Rhinorrhea present.     Mouth/Throat:     Mouth: Mucous membranes are moist.     Pharynx: Posterior oropharyngeal erythema present.  Eyes:     Extraocular Movements: Extraocular movements intact.     Conjunctiva/sclera: Conjunctivae normal.  Cardiovascular:     Rate and Rhythm: Normal rate and regular rhythm.     Heart sounds: Normal heart sounds.  Pulmonary:     Effort: Pulmonary effort is normal.     Breath sounds: Wheezing present. No rales.  Musculoskeletal:        General: Tenderness present. No swelling or deformity. Normal range of motion.     Cervical back: Normal range of motion and neck supple.     Comments: No midline spinal tenderness to palpation diffusely.  Localized area of tenderness to palpation to the right mid and low back, antalgic movements negative straight leg raise bilateral  lower extremities.  Skin:    General: Skin is warm and dry.  Neurological:     Mental Status: She is alert and oriented to person, place, and time.  Psychiatric:        Mood and Affect: Mood normal.        Thought Content: Thought content normal.      UC Treatments / Results  Labs (all labs ordered are listed, but only abnormal results are displayed) Labs Reviewed  POCT URINALYSIS DIP (MANUAL ENTRY)    EKG   Radiology No results found.  Procedures Procedures (including critical care time)  Medications Ordered in UC Medications  dexamethasone (DECADRON) injection 10 mg (has no administration in time range)    Initial Impression / Assessment and Plan / UC Course  I have reviewed  the triage vital signs and the nursing notes.  Pertinent labs & imaging results that were available during my care of the patient were reviewed by me and considered in my medical decision making (see chart for details).     Vitals and exam very reassuring today.  Urinalysis without evidence of kidney stone, urinary tract infection.  Suspect musculoskeletal cause of her back pain so we will treat with IM Decadron, Zanaflex and suspect the Decadron will also help with her ongoing wheezing from asthma exacerbation.  Continue inhaler regimen and allergy regimen additionally.  Return for worsening symptoms.  Final Clinical Impressions(s) / UC Diagnoses   Final diagnoses:  Right flank pain  SOB (shortness of breath)  Wheezing  Moderate persistent asthma with acute exacerbation     Discharge Instructions      We have given you a steroid shot today to help both with your wheezing and cough that is lingering as well as your right sided back pain.  I have also sent in a muscle relaxer in case the pain is muscular in nature.  Continue taking your albuterol and I recommend taking Zyrtec and Flonase daily in case seasonal allergies are causing your upper respiratory symptoms to linger.  Follow-up for unresolving symptoms.    ED Prescriptions     Medication Sig Dispense Auth. Provider   tiZANidine (ZANAFLEX) 4 MG capsule Take 1 capsule (4 mg total) by mouth 3 (three) times daily as needed for muscle spasms. Do not drink alcohol or drive while taking this medication.  May cause drowsiness. 15 capsule Particia Nearing, [place]      PDMP not reviewed this encounter.   Particia Nearing, [place] 12/02/23 1701

## 2023-12-02 NOTE — Discharge Instructions (Signed)
We have given you a steroid shot today to help both with your wheezing and cough that is lingering as well as your right sided back pain.  I have also sent in a muscle relaxer in case the pain is muscular in nature.  Continue taking your albuterol and I recommend taking Zyrtec and Flonase daily in case seasonal allergies are causing your upper respiratory symptoms to linger.  Follow-up for unresolving symptoms.

## 2023-12-02 NOTE — ED Triage Notes (Signed)
Pt reports she "cannot breath " or take a deep breath, right mid side to back pain x 3 days

## 2023-12-12 ENCOUNTER — Emergency Department (HOSPITAL_COMMUNITY): Payer: Medicaid Other

## 2023-12-12 ENCOUNTER — Other Ambulatory Visit: Payer: Self-pay

## 2023-12-12 ENCOUNTER — Encounter (HOSPITAL_COMMUNITY): Payer: Self-pay

## 2023-12-12 ENCOUNTER — Emergency Department (HOSPITAL_COMMUNITY)
Admission: EM | Admit: 2023-12-12 | Discharge: 2023-12-12 | Disposition: A | Discharge status: Home / Self Care | Payer: Medicaid Other | Attending: Emergency Medicine | Admitting: Emergency Medicine

## 2023-12-12 DIAGNOSIS — S61211A Laceration without foreign body of left index finger without damage to nail, initial encounter: Secondary | ICD-10-CM | POA: Diagnosis not present

## 2023-12-12 DIAGNOSIS — Z23 Encounter for immunization: Secondary | ICD-10-CM | POA: Diagnosis not present

## 2023-12-12 DIAGNOSIS — X58XXXA Exposure to other specified factors, initial encounter: Secondary | ICD-10-CM | POA: Insufficient documentation

## 2023-12-12 DIAGNOSIS — Z91040 Latex allergy status: Secondary | ICD-10-CM | POA: Diagnosis not present

## 2023-12-12 DIAGNOSIS — S0990XA Unspecified injury of head, initial encounter: Secondary | ICD-10-CM | POA: Diagnosis not present

## 2023-12-12 DIAGNOSIS — R55 Syncope and collapse: Secondary | ICD-10-CM | POA: Diagnosis not present

## 2023-12-12 DIAGNOSIS — S6992XA Unspecified injury of left wrist, hand and finger(s), initial encounter: Secondary | ICD-10-CM | POA: Diagnosis present

## 2023-12-12 LAB — CBC
HCT: 45.3 % (ref 36.0–46.0)
Hemoglobin: 14.9 g/dL (ref 12.0–15.0)
MCH: 32.2 pg (ref 26.0–34.0)
MCHC: 32.9 g/dL (ref 30.0–36.0)
MCV: 97.8 fL (ref 80.0–100.0)
Platelets: 235 10*3/uL (ref 150–400)
RBC: 4.63 MIL/uL (ref 3.87–5.11)
RDW: 12.3 % (ref 11.5–15.5)
WBC: 12.1 10*3/uL — ABNORMAL HIGH (ref 4.0–10.5)
nRBC: 0 % (ref 0.0–0.2)

## 2023-12-12 LAB — BASIC METABOLIC PANEL
Anion gap: 9 (ref 5–15)
BUN: 14 mg/dL (ref 6–20)
CO2: 21 mmol/L — ABNORMAL LOW (ref 22–32)
Calcium: 9.2 mg/dL (ref 8.9–10.3)
Chloride: 107 mmol/L (ref 98–111)
Creatinine, Ser: 0.71 mg/dL (ref 0.44–1.00)
GFR, Estimated: >60 mL/min (ref >60)
Glucose, Bld: 85 mg/dL (ref 70–99)
Potassium: 3.5 mmol/L (ref 3.5–5.1)
Sodium: 137 mmol/L (ref 135–145)

## 2023-12-12 LAB — TROPONIN I (HIGH SENSITIVITY)
Troponin I (High Sensitivity): <2 ng/L (ref <18)
Troponin I (High Sensitivity): <2 ng/L (ref <18)

## 2023-12-12 LAB — CBG MONITORING, ED: Glucose-Capillary: 99 mg/dL (ref 70–99)

## 2023-12-12 MED ORDER — TETANUS-DIPHTH-ACELL PERTUSSIS 5-2.5-18.5 LF-MCG/0.5 IM SUSY
0.5000 mL | PREFILLED_SYRINGE | Freq: Once | INTRAMUSCULAR | Status: AC
  Administered 2023-12-12: 0.5 mL via INTRAMUSCULAR
  Filled 2023-12-12: qty 0.5

## 2023-12-12 MED ORDER — LIDOCAINE HCL (PF) 1 % IJ SOLN
30.0000 mL | Freq: Once | INTRAMUSCULAR | Status: AC
  Administered 2023-12-12: 30 mL
  Filled 2023-12-12: qty 30

## 2023-12-12 NOTE — ED Provider Notes (Signed)
 [place] EMERGENCY DEPARTMENT AT [facility] Provider Note   CSN: [id number] Arrival date & time: 12/12/23  0141     History  Chief Complaint  Patient presents with   Syncope  laceration     Destiny Petty is a 43 y.o. female.  Patient presents to the emergency department for evaluation after a syncopal episode.  Patient reports that she woke up not feeling well.  She sat on the edge of the bed and drank some water .  She thought she was going to the bathroom to urinate, but then woke up on the floor.  She thinks she passed out.  She was incontinent of urine.  Family members report that she seemed confused afterwards.  Complains of a headache, left index finger injury.       Home Medications Prior to Admission medications   Medication Sig Start Date End Date Taking? Authorizing Provider  albuterol  (VENTOLIN  HFA) 108 (90 Base) MCG/ACT inhaler Inhale 2 puffs into the lungs every 6 (six) hours as needed for wheezing or shortness of breath. 07/16/23   Leath-Warren, Etta PARAS, NP  amLODipine  (NORVASC ) 10 MG tablet Take 1 tablet (10 mg total) by mouth daily. 12/01/23   [name] [name], MD  cetirizine  (ZYRTEC ) 10 MG tablet Take 1 tablet (10 mg total) by mouth daily. 03/21/23   Del Wilhelmena Lloyd Sola, FNP  fluticasone  (FLONASE ) 50 MCG/ACT nasal spray Place 1 spray into both nostrils 2 (two) times daily. 11/09/23   Stuart Vernell Norris, PA-C  meloxicam  (MOBIC ) 7.5 MG tablet Take 1 tablet (7.5 mg total) by mouth daily. Patient not taking: Reported on 04/30/2023 02/25/23   [name] [name], MD  ondansetron  (ZOFRAN ) 4 MG tablet Take 1 tablet (4 mg total) by mouth every 8 (eight) hours as needed for nausea or vomiting. 11/20/23   Melvenia Manus BRAVO, MD  pantoprazole  (PROTONIX ) 40 MG tablet Take 1 tablet (40 mg total) by mouth daily. 11/20/23   Melvenia Manus BRAVO, MD  rizatriptan  (MAXALT -MLT) 10 MG disintegrating tablet TAKE 1 TABLET BY MOUTH ONCE AS NEEDED FOR MIGRAINE. MAY REPEAT  IN 2 HOURS IF NEEDED. 12/01/23   [name] [name], MD  Semaglutide -Weight Management 1.7 MG/0.75ML SOAJ Inject 1.7 mg into the skin once a week. 11/20/23   Antonetta Rollene BRAVO, MD  Tiotropium Bromide -Olodaterol 2.5-2.5 MCG/ACT AERS Inhale 2 puffs into the lungs daily. Patient not taking: Reported on 04/30/2023 03/21/23   Del Orbe Polanco, Iliana, FNP  tiZANidine  (ZANAFLEX ) 4 MG capsule Take 1 capsule (4 mg total) by mouth 3 (three) times daily as needed for muscle spasms. Do not drink alcohol or drive while taking this medication.  May cause drowsiness. 12/02/23   Stuart Vernell Norris, PA-C      Allergies    Bee venom, Latex, and Lisinopril    Review of Systems   Review of Systems  Physical Exam Updated Vital Signs BP 121/71   Pulse 85   Temp 98.2 F (36.8 C) (Oral)   Resp 18   Ht 5' (1.524 m)   Wt 85.3 kg   SpO2 94%   BMI 36.72 kg/m  Physical Exam Vitals and nursing note reviewed.  Constitutional:      General: She is not in acute distress.    Appearance: She is well-developed.  HENT:     Head: Normocephalic and atraumatic.     Mouth/Throat:     Mouth: Mucous membranes are moist.  Eyes:     General: Vision grossly intact. Gaze aligned  appropriately.     Extraocular Movements: Extraocular movements intact.     Conjunctiva/sclera: Conjunctivae normal.  Cardiovascular:     Rate and Rhythm: Normal rate and regular rhythm.     Pulses: Normal pulses.     Heart sounds: Normal heart sounds, S1 normal and S2 normal. No murmur heard.    No friction rub. No gallop.  Pulmonary:     Effort: Pulmonary effort is normal. No respiratory distress.     Breath sounds: Normal breath sounds.  Abdominal:     General: Bowel sounds are normal.     Palpations: Abdomen is soft.     Tenderness: There is no abdominal tenderness. There is no guarding or rebound.     Hernia: No hernia is present.  Musculoskeletal:        General: No swelling.     Cervical back: Full passive range of motion  without pain, normal range of motion and neck supple. No spinous process tenderness or muscular tenderness. Normal range of motion.     Right lower leg: No edema.     Left lower leg: No edema.  Skin:    General: Skin is warm and dry.     Capillary Refill: Capillary refill takes less than 2 seconds.     Findings: Laceration (Dorsal aspect of proximal phalanx of left index finger) present. No ecchymosis, erythema, rash or wound.  Neurological:     General: No focal deficit present.     Mental Status: She is alert and oriented to person, place, and time.     GCS: GCS eye subscore is 4. GCS verbal subscore is 5. GCS motor subscore is 6.     Cranial Nerves: Cranial nerves 2-12 are intact.     Sensory: Sensation is intact.     Motor: Motor function is intact.     Coordination: Coordination is intact.  Psychiatric:        Attention and Perception: Attention normal.        Mood and Affect: Mood normal.        Speech: Speech normal.        Behavior: Behavior normal.     ED Results / Procedures / Treatments   Labs (all labs ordered are listed, but only abnormal results are displayed) Labs Reviewed  BASIC METABOLIC PANEL - Abnormal; Notable for the following components:      Result Value   CO2 21 (*)    All other components within normal limits  CBC - Abnormal; Notable for the following components:   WBC 12.1 (*)    All other components within normal limits  URINALYSIS, ROUTINE W REFLEX MICROSCOPIC  CBG MONITORING, ED  POC URINE PREG, ED  TROPONIN I (HIGH SENSITIVITY)  TROPONIN I (HIGH SENSITIVITY)    EKG EKG Interpretation Date/Time:  Friday December 12 2023 01:55:36 EST Ventricular Rate:  82 PR Interval:  154 QRS Duration:  80 QT Interval:  386 QTC Calculation: 450 R Axis:   47  Text Interpretation: Normal sinus rhythm Low voltage QRS Cannot rule out Anterior infarct , age undetermined Abnormal ECG When compared with ECG of 16-Jul-2023 18:36, No significant change was found  Confirmed by Haze Lonni PARAS [id number]) on 12/12/2023 4:24:44 AM  Radiology CT HEAD WO CONTRAST ( ) Result Date: 12/12/2023 CLINICAL DATA:  Head trauma.  Syncope EXAM: CT HEAD WITHOUT CONTRAST TECHNIQUE: Contiguous axial images were obtained from the base of the skull through the vertex without intravenous contrast. RADIATION DOSE REDUCTION: This exam was performed  according to the departmental dose-optimization program which includes automated exposure control, adjustment of the mA and/or kV according to patient size and/or use of iterative reconstruction technique. COMPARISON:  None Available. FINDINGS: Brain: No evidence of acute infarction, hemorrhage, hydrocephalus, extra-axial collection or mass lesion/mass effect. Vascular: No hyperdense vessel or unexpected calcification. Skull: Normal. Negative for fracture or focal lesion. Sinuses/Orbits: No acute finding. IMPRESSION: Normal head CT. Electronically Signed   By: Dorn Roulette M.D.   On: 12/12/2023 04:16   DG Finger Index Left Result Date: 12/12/2023 CLINICAL DATA:  Fall, injury.  Laceration to second digit. EXAM: LEFT INDEX FINGER 2+V COMPARISON:  None Available. FINDINGS: There is no evidence of fracture or dislocation. There is no evidence of arthropathy or other focal bone abnormality. Mild soft tissue edema proximally. Site of laceration is not well-defined by radiograph. No radiopaque foreign body. IMPRESSION: Soft tissue edema. No fracture or radiopaque foreign body. Electronically Signed   By: Andrea Gasman M.D.   On: 12/12/2023 03:46    Procedures .Laceration Repair  Date/Time: 12/12/2023 4:51 AM  Performed by: Haze Lonni PARAS, MD Authorized by: Haze Lonni PARAS, MD   Consent:    Consent obtained:  Verbal   Consent given by:  Patient   Risks, benefits, and alternatives were discussed: yes     Risks discussed:  Infection, pain and retained foreign body Universal protocol:    Procedure explained and questions  answered to patient or proxy's satisfaction: yes     Relevant documents present and verified: yes     Test results available: yes     Imaging studies available: yes     Required blood products, implants, devices, and special equipment available: yes     Site/side marked: yes     Immediately prior to procedure, a time out was called: yes     Patient identity confirmed:  Verbally with patient Anesthesia:    Anesthesia method:  Local infiltration   Local anesthetic:  Lidocaine  1% w/o epi Laceration details:    Location:  Finger   Finger location:  L index finger   Length (cm):  1.8 Pre-procedure details:    Preparation:  Patient was prepped and draped in usual sterile fashion and imaging obtained to evaluate for foreign bodies Exploration:    Hemostasis achieved with:  Epinephrine    Wound exploration: wound explored through full range of motion and entire depth of wound visualized     Wound extent: no nerve damage, no tendon damage and no underlying fracture     Contaminated: no   Treatment:    Area cleansed with:  Chlorhexidine    Amount of cleaning:  Standard   Irrigation solution:  Sterile water    Irrigation method:  Syringe   Debridement:  None Skin repair:    Repair method:  Sutures   Suture size:  4-0   Suture material:  Nylon   Suture technique:  Simple interrupted   Number of sutures:  4 Approximation:    Approximation:  Close Repair type:    Repair type:  Simple Post-procedure details:    Dressing:  Adhesive bandage     Medications Ordered in ED Medications  lidocaine  (PF) (XYLOCAINE ) 1 % injection 30 mL (has no administration in time range)  Tdap (BOOSTRIX ) injection 0.5 mL (0.5 mLs Intramuscular Given 12/12/23 0336)    ED Course/ Medical Decision Making/ A&P  Medical Decision Making Amount and/or Complexity of Data Reviewed Labs: ordered. Radiology: ordered.  Risk Prescription drug management.   Differential Diagnosis  considered includes, but not limited to: Arrhythmia; MI; vasovagal episode; seizure; toxidrome; PE; stroke  Presents to the emergency department for evaluation of syncope.  Patient did have some precursor symptoms of feeling like she was going to pass out which is reassuring.  Vital signs are normal here.  She has a normal neurologic exam.  Patient has a headache, CT head unremarkable.  All remainder of blood work is normal.  She did report that she was experiencing some chest pain on and off for the last few days.  Troponins negative x 2, EKG unchanged.  Patient not tachycardic, tachypneic or hypoxic.  No independent risk factors for PE, felt to be unlikely.  Normal mediastinum on x-ray, no concern for aortic syndrome.  Patient was incontinent of urine but she was on her way to the bathroom when she passed out.  I suspect she urinated because she had a full bladder, there is not appear to have been any seizure activity.  Patient has had similar stress responses in the past and has been under a lot of stress recently.  She appears well, workup is reassuring.  No hospitalization required.  Will discharge, follow-up with primary care.  Was placed in the left index finger.  X-ray negative for fracture.  Will require suture removal in 7 to 10 days.        Final Clinical Impression(s) / ED Diagnoses Final diagnoses:  Syncope, unspecified syncope type  Laceration of left index finger without foreign body without damage to nail, initial encounter    Rx / DC Orders ED Discharge Orders     None         Haze Lonni PARAS, MD 12/12/23 [phone]

## 2023-12-12 NOTE — ED Notes (Signed)
Patient verbalizes understanding of discharge instructions. Opportunity for questioning and answers were provided. Armband removed by staff, pt discharged from ED. Wheeled out to lobby, awaiting ride

## 2023-12-12 NOTE — ED Triage Notes (Addendum)
 Pt arrived POV with family for c/o feeling like she was going to pass out, like a warm feeling coming over me while laying in bed. Pt reports sat up on side of bed to take a few sips of water  and got up to go to the BR and thinks she had a syncopal episode, she woke up near the bathroom closet. Niece reports pt face was very pale when she awoke and was confused. Pt reports she urinated on herself and has a cut on her left 2nd digit of the left hand, unsure how she cut her finger. Bleeding controlled at this time. Pt reports this has happen in the past d/t stress and reports has been under a lot of stress as well with family and other issus. Pt also reports CP for the past 3-4 days intermittent  Pt A&O x4, NAD noted, VSS. No hx of seizures or heart issues.

## 2023-12-17 ENCOUNTER — Emergency Department (HOSPITAL_COMMUNITY): Payer: Medicaid Other

## 2023-12-17 ENCOUNTER — Ambulatory Visit: Payer: Self-pay | Admitting: Internal Medicine

## 2023-12-17 ENCOUNTER — Emergency Department (HOSPITAL_COMMUNITY)
Admission: EM | Admit: 2023-12-17 | Discharge: 2023-12-17 | Disposition: A | Discharge status: Home / Self Care | Payer: Medicaid Other

## 2023-12-17 DIAGNOSIS — R072 Precordial pain: Secondary | ICD-10-CM | POA: Insufficient documentation

## 2023-12-17 DIAGNOSIS — Z7951 Long term (current) use of inhaled steroids: Secondary | ICD-10-CM | POA: Diagnosis not present

## 2023-12-17 DIAGNOSIS — J45909 Unspecified asthma, uncomplicated: Secondary | ICD-10-CM | POA: Insufficient documentation

## 2023-12-17 DIAGNOSIS — I10 Essential (primary) hypertension: Secondary | ICD-10-CM | POA: Insufficient documentation

## 2023-12-17 DIAGNOSIS — R079 Chest pain, unspecified: Secondary | ICD-10-CM

## 2023-12-17 DIAGNOSIS — Z79899 Other long term (current) drug therapy: Secondary | ICD-10-CM | POA: Diagnosis not present

## 2023-12-17 DIAGNOSIS — R112 Nausea with vomiting, unspecified: Secondary | ICD-10-CM | POA: Diagnosis not present

## 2023-12-17 DIAGNOSIS — Z91040 Latex allergy status: Secondary | ICD-10-CM | POA: Diagnosis not present

## 2023-12-17 DIAGNOSIS — F17200 Nicotine dependence, unspecified, uncomplicated: Secondary | ICD-10-CM | POA: Insufficient documentation

## 2023-12-17 DIAGNOSIS — R519 Headache, unspecified: Secondary | ICD-10-CM | POA: Insufficient documentation

## 2023-12-17 DIAGNOSIS — R55 Syncope and collapse: Secondary | ICD-10-CM | POA: Insufficient documentation

## 2023-12-17 DIAGNOSIS — R0789 Other chest pain: Secondary | ICD-10-CM | POA: Diagnosis not present

## 2023-12-17 LAB — CBC WITH DIFFERENTIAL/PLATELET
Abs Immature Granulocytes: 0.03 10*3/uL (ref 0.00–0.07)
Basophils Absolute: 0.1 10*3/uL (ref 0.0–0.1)
Basophils Relative: 1 %
Eosinophils Absolute: 0.2 10*3/uL (ref 0.0–0.5)
Eosinophils Relative: 2 %
HCT: 46.5 % — ABNORMAL HIGH (ref 36.0–46.0)
Hemoglobin: 16.1 g/dL — ABNORMAL HIGH (ref 12.0–15.0)
Immature Granulocytes: 0 %
Lymphocytes Relative: 25 %
Lymphs Abs: 2.3 10*3/uL (ref 0.7–4.0)
MCH: 32.7 pg (ref 26.0–34.0)
MCHC: 34.6 g/dL (ref 30.0–36.0)
MCV: 94.3 fL (ref 80.0–100.0)
Monocytes Absolute: 0.7 10*3/uL (ref 0.1–1.0)
Monocytes Relative: 7 %
Neutro Abs: 6.1 10*3/uL (ref 1.7–7.7)
Neutrophils Relative %: 65 %
Platelets: 303 10*3/uL (ref 150–400)
RBC: 4.93 MIL/uL (ref 3.87–5.11)
RDW: 12.3 % (ref 11.5–15.5)
WBC: 9.5 10*3/uL (ref 4.0–10.5)
nRBC: 0 % (ref 0.0–0.2)

## 2023-12-17 LAB — BASIC METABOLIC PANEL
Anion gap: 11 (ref 5–15)
BUN: 13 mg/dL (ref 6–20)
CO2: 19 mmol/L — ABNORMAL LOW (ref 22–32)
Calcium: 9.4 mg/dL (ref 8.9–10.3)
Chloride: 106 mmol/L (ref 98–111)
Creatinine, Ser: 0.78 mg/dL (ref 0.44–1.00)
GFR, Estimated: >60 mL/min (ref >60)
Glucose, Bld: 119 mg/dL — ABNORMAL HIGH (ref 70–99)
Potassium: 3.4 mmol/L — ABNORMAL LOW (ref 3.5–5.1)
Sodium: 136 mmol/L (ref 135–145)

## 2023-12-17 LAB — MAGNESIUM: Magnesium: 2 mg/dL (ref 1.7–2.4)

## 2023-12-17 LAB — TROPONIN I (HIGH SENSITIVITY): Troponin I (High Sensitivity): 3 ng/L (ref <18)

## 2023-12-17 LAB — BRAIN NATRIURETIC PEPTIDE: B Natriuretic Peptide: 17.5 pg/mL (ref 0.0–100.0)

## 2023-12-17 NOTE — ED Notes (Signed)
 Patient discharged to lobby. Patient verbalizes instructions with no additional questions for this RN.

## 2023-12-17 NOTE — ED Notes (Signed)
 2nd troponin not needed per PA

## 2023-12-17 NOTE — ED Provider Notes (Signed)
 [place] EMERGENC[facility] DEPARTMENT AT [facility] Provider Note   CSN: [id number] Arrival date & time: 12/17/23  1227     History  Chief Complaint  Patient presents with   Chest Pain    Destiny Petty is a 43 y.o. female.  The history is provided by the patient and medical records. No language interpreter was used.  Chest Pain    43 year old female with history of hypertension, bipolar, GERD, anxiety, asthma, obesity, hyperlipidemia presenting with complaints of chest pain.  Patient endorsing ongoing midsternal nonradiating chest pain for the past 5 days.  Pain is worse with exertion and has some nausea and vomiting component to it.  She mention she was seen on 1/30 for chest pain and a syncopal episode.  She was initially evaluated and discharged home but her chest pain has been persistent since.  She also complaining of a throbbing headache that has been there since.  She mention that her blood pressure has been fluctuating and it was high last night in the 160s systolic.  She is currently on blood pressure medication and states her doctor did switch her medication recently because she has lost weight.  She mention despite having a blood pressure medication change, her blood pressure has remained high.  She endorsed having increasing stress.  She is a tobacco user and smoke approximately half a pack a day.  She drinks alcohol on occasion.  She denies any drug use.  No prior cardiac workup.  Does have family history of cardiac disease.  Home Medications Prior to Admission medications   Medication Sig Start Date End Date Taking? Authorizing Provider  albuterol  (VENTOLIN  HFA) 108 (90 Base) MCG/ACT inhaler Inhale 2 puffs into the lungs every 6 (six) hours as needed for wheezing or shortness of breath. 07/16/23   Leath-Warren, Etta PARAS, NP  amLODipine  (NORVASC ) 10 MG tablet Take 1 tablet (10 mg total) by mouth daily. 12/01/23   [name] [name], MD  cetirizine  (ZYRTEC ) 10 MG  tablet Take 1 tablet (10 mg total) by mouth daily. 03/21/23   Del Wilhelmena Lloyd Sola, FNP  fluticasone  (FLONASE ) 50 MCG/ACT nasal spray Place 1 spray into both nostrils 2 (two) times daily. 11/09/23   Stuart Vernell Norris, PA-C  meloxicam  (MOBIC ) 7.5 MG tablet Take 1 tablet (7.5 mg total) by mouth daily. Patient not taking: Reported on 04/30/2023 02/25/23   [name] [name], MD  ondansetron  (ZOFRAN ) 4 MG tablet Take 1 tablet (4 mg total) by mouth every 8 (eight) hours as needed for nausea or vomiting. 11/20/23   Melvenia Manus BRAVO, MD  pantoprazole  (PROTONIX ) 40 MG tablet Take 1 tablet (40 mg total) by mouth daily. 11/20/23   Melvenia Manus BRAVO, MD  rizatriptan  (MAXALT -MLT) 10 MG disintegrating tablet TAKE 1 TABLET BY MOUTH ONCE AS NEEDED FOR MIGRAINE. MAY REPEAT IN 2 HOURS IF NEEDED. 12/01/23   [name] [name], MD  Semaglutide -Weight Management 1.7 MG/0.75ML SOAJ Inject 1.7 mg into the skin once a week. 11/20/23   Antonetta Rollene BRAVO, MD  Tiotropium Bromide -Olodaterol 2.5-2.5 MCG/ACT AERS Inhale 2 puffs into the lungs daily. Patient not taking: Reported on 04/30/2023 03/21/23   Del Orbe Polanco, Iliana, FNP  tiZANidine  (ZANAFLEX ) 4 MG capsule Take 1 capsule (4 mg total) by mouth 3 (three) times daily as needed for muscle spasms. Do not drink alcohol or drive while taking this medication.  May cause drowsiness. 12/02/23   Stuart Vernell Norris, PA-C      Allergies    Bee  venom, Latex, and Lisinopril    Review of Systems   Review of Systems  Cardiovascular:  Positive for chest pain.  All other systems reviewed and are negative.   Physical Exam Updated Vital Signs BP 113/83 (BP Location: Right Arm)   Pulse (!) 107   Temp 98.3 F (36.8 C) (Oral)   Resp 16   Ht 5' (1.524 m)   Wt 85.7 kg   SpO2 98%   BMI 36.91 kg/m  Physical Exam Vitals and nursing note reviewed.  Constitutional:      General: She is not in acute distress.    Appearance: She is well-developed.  HENT:     Head:  Normocephalic and atraumatic.  Eyes:     Extraocular Movements: Extraocular movements intact.     Conjunctiva/sclera: Conjunctivae normal.     Pupils: Pupils are equal, round, and reactive to light.  Cardiovascular:     Rate and Rhythm: Normal rate and regular rhythm.     Pulses: Normal pulses.     Heart sounds: Normal heart sounds.  Pulmonary:     Effort: Pulmonary effort is normal.  Abdominal:     Palpations: Abdomen is soft.     Tenderness: There is no abdominal tenderness.  Musculoskeletal:        General: Normal range of motion.     Cervical back: Normal range of motion and neck supple. No rigidity.  Skin:    Findings: No rash.  Neurological:     Mental Status: She is alert. Mental status is at baseline.     GCS: GCS eye subscore is 4. GCS verbal subscore is 5. GCS motor subscore is 6.     Cranial Nerves: Cranial nerves 2-12 are intact.     Sensory: Sensation is intact.     Motor: Motor function is intact.  Psychiatric:        Mood and Affect: Mood normal.     ED Results / Procedures / Treatments   Labs (all labs ordered are listed, but only abnormal results are displayed) Labs Reviewed  BASIC METABOLIC PANEL - Abnormal; Notable for the following components:      Result Value   Potassium 3.4 (*)    CO2 19 (*)    Glucose, Bld 119 (*)    All other components within normal limits  CBC WITH DIFFERENTIAL/PLATELET - Abnormal; Notable for the following components:   Hemoglobin 16.1 (*)    HCT 46.5 (*)    All other components within normal limits  BRAIN NATRIURETIC PEPTIDE  MAGNESIUM  TROPONIN I (HIGH SENSITIVITY)  TROPONIN I (HIGH SENSITIVITY)    EKG None  Date: 12/17/2023  Rate: 99  Rhythm: normal sinus rhythm  QRS Axis: normal  Intervals: normal  ST/T Wave abnormalities: normal  Conduction Disutrbances: none  Narrative Interpretation:   Old EKG Reviewed: No significant changes noted    Radiology DG Chest 2 View Result Date: 12/17/2023 CLINICAL DATA:   Chest pain. EXAM: CHEST - 2 VIEW COMPARISON:  01/01/2023. FINDINGS: Bilateral lung fields are clear. Bilateral costophrenic angles are clear. Normal cardio-mediastinal silhouette. No acute osseous abnormalities. The soft tissues are within normal limits. IMPRESSION: No active cardiopulmonary disease. Electronically Signed   By: Ree Molt M.D.   On: 12/17/2023 14:27    Procedures Procedures    Medications Ordered in ED Medications - No data to display  ED Course/ Medical Decision Making/ A&P Clinical Course as of 12/17/23 1756  Wed Dec 17, 2023  1756 Troponin I (High Sensitivity):  3 [EP]    Clinical Course User Index [EP] Cammie Rocky JONELLE Jann                                 Medical Decision Making  BP 125/79   Pulse 79   Temp 98.3 F (36.8 C) (Oral)   Resp 16   Ht 5' (1.524 m)   Wt 85.7 kg   SpO2 97%   BMI 36.91 kg/m   20:51 PM  43 year old female with history of hypertension, bipolar, GERD, anxiety, asthma, obesity, hyperlipidemia presenting with complaints of chest pain.  Patient endorsing ongoing midsternal nonradiating chest pain for the past 5 days.  Pain is worse with exertion and has some nausea and vomiting component to it.  She mention she was seen on 1/30 for chest pain and a syncopal episode.  She was initially evaluated and discharged home but her chest pain has been persistent since.  She also complaining of a throbbing headache that has been there since.  She mention that her blood pressure has been fluctuating and it was high last night in the 160s systolic.  She is currently on blood pressure medication and states her doctor did switch her medication recently because she has lost weight.  She mention despite having a blood pressure medication change, her blood pressure has remained high.  She endorsed having increasing stress.  She is a tobacco user and smoke approximately half a pack a day.  She drinks alcohol on occasion.  She denies any drug use.  No prior  cardiac workup.  Does have family history of cardiac disease.  Exam overall reassuring patient without any focal neurodeficit.  She has some headache but she has no nuchal rigidity concerning for meningitis.  Heart with normal rate rhythm lungs are clear to auscultation bilaterally abdomen is soft nontender strength is equal throughout  Vital signs are overall reassuring no hypertension no fever no hypoxia.  Heart score of 3, low risk of Mace  -Labs ordered, independently viewed and interpreted by me.  Labs remarkable for reassuring labs -The patient was maintained on a cardiac monitor.  I personally viewed and interpreted the cardiac monitored which showed an underlying rhythm of: NSR -Imaging independently viewed and interpreted by me and I agree with radiologist's interpretation.  Result remarkable for chest xray unremarkable -This patient presents to the ED for concern of chest pain, this involves an extensive number of treatment options, and is a complaint that carries with it a high risk of complications and morbidity.  The differential diagnosis includes ACS, stress, anxiety, GERD, MSK, PE, PTX -Co morbidities that complicate the patient evaluation includes bipolar, tobacco use, GERD, HTN -Treatment includes reassurance -Reevaluation of the patient after these medicines showed that the patient improved -PCP office notes or outside notes reviewed -will give outpt f/u with cardiology -Escalation to admission/observation considered: patients feels much better, is comfortable with discharge, and will follow up with cardiology -Prescription medication considered, patient comfortable with her home medication -Social Determinant of Health considered which includes tobacco use.   Will have patient follow-up with cardiology for outpatient evaluation of her chest discomfort as well as her syncopal episodes.  She may benefit from Holter monitoring if appropriate.  Otherwise patient stable to be  discharged home.       Final Clinical Impression(s) / ED Diagnoses Final diagnoses:  Nonspecific chest pain  Syncope, unspecified syncope type    Rx /  DC Orders ED Discharge Orders     None         Nivia Colon, DEVONNA 12/17/23 1828    Ula Prentice SAUNDERS, MD 12/17/23 [id number]

## 2023-12-17 NOTE — ED Triage Notes (Signed)
 Pt states that she has been having mid-sternal non-radiating CP since 1/3 when she had encounter for syncope. Pt also states that she was having hypertensive episodes at home 165/107.

## 2023-12-17 NOTE — Discharge Instructions (Addendum)
 You have been evaluated for your symptoms.  Fortunately no concerning findings were noted on today's exam.  Please follow-up closely with cardiology office for further evaluation and managements of your recurrent chest pain.  Cardiology will also help evaluate for your recurrent passing out episode.  You may benefit from a cardiac Holter monitoring if appropriate.

## 2023-12-17 NOTE — ED Provider Triage Note (Signed)
 Emergency Medicine Provider Triage Evaluation Note  Destiny Petty , a 43 y.o. female  was evaluated in triage.  Pt complains of recently seen for her syncopal episode.  States she is having ongoing chest pain and elevated blood pressure at home. no additional syncopal episodes..  Review of Systems  Positive: As above Negative: As above  Physical Exam  There were no vitals taken for this visit. Gen:   Awake, no distress   Resp:  Normal effort MSK:   Moves extremities without difficulty  Other:    Medical Decision Making  Medically screening exam initiated at 1:08 PM.  Appropriate orders placed.  Destiny Petty was informed that the remainder of the evaluation will be completed by another provider, this initial triage assessment does not replace that evaluation, and the importance of remaining in the ED until their evaluation is complete.     Hildegard Loge, PA-C 12/17/23 1308

## 2023-12-17 NOTE — Telephone Encounter (Signed)
  Chief Complaint: Chest Pain, High Blood pressure Symptoms: chest pain, high blood pressure, vomiting Frequency: since friday Pertinent Negatives: Patient denies fever Disposition: [x] ED /[] Urgent Care (no appt availability in office) / [] Appointment(In office/virtual)/ []  [place] [facility]/ [] Home Care/ [] Refused Recommended Disposition /[] [place] [facility]/ []  Follow-up with PCP Additional Notes: patient c/o Chest pain since her ED visit on Friday. Patient endorses high BP readings along with vomiting. Per protocol, the recommendation would be emergency department. Patient verbalizes understanding of plan and states that she will be going back to the ED. All questions answered.    Copied from CRM [id number]. Topic: Clinical - Red Word Triage >> Dec 17, 2023  9:48 AM Montie POUR wrote: Red Word that prompted transfer to Nurse Triage: Chest pains for about 1 week; Last Friday she was taken to the ED because she past out and was chest pain. ED said no stroke or heart attack. She is still having chest pains and high blood pressure (165/107 - at [facility] yesterday; when she got it was 140/110) Reason for Disposition  [1] Chest pain lasts > 5 minutes AND [2] occurred in past 3 days (72 hours) (Exception: Feels exactly the same as previously diagnosed heartburn and has accompanying sour taste in mouth.)  Answer Assessment - Initial Assessment Questions 1. LOCATION: Where does it hurt?       Center of chest 2. RADIATION: Does the pain go anywhere else? (e.g., into neck, jaw, arms, back)     Doesn't radiate 3. ONSET: When did the chest pain begin? (Minutes, hours or days)      New Years Day 4. PATTERN: Does the pain come and go, or has it been constant since it started?  Does it get worse with exertion?      Constant 5. DURATION: How long does it last (e.g., seconds, minutes, hours)     Has been continuous 6. SEVERITY: How bad is the pain?  (e.g., Scale 1-10; mild, moderate, or  severe)    - MILD (1-3): doesn't interfere with normal activities     - MODERATE (4-7): interferes with normal activities or awakens from sleep    - SEVERE (8-10): excruciating pain, unable to do any normal activities       5 7. CARDIAC RISK FACTORS: Do you have any history of heart problems or risk factors for heart disease? (e.g., angina, prior heart attack; diabetes, high blood pressure, high cholesterol, smoker, or strong family history of heart disease)     High blood pressure 8. PULMONARY RISK FACTORS: Do you have any history of lung disease?  (e.g., blood clots in lung, asthma, emphysema, birth control pills)     No 9. CAUSE: What do you think is causing the chest pain?     Believes that her BP is the problem 10. OTHER SYMPTOMS: Do you have any other symptoms? (e.g., dizziness, nausea, vomiting, sweating, fever, difficulty breathing, cough)       vomiting 11. PREGNANCY: Is there any chance you are pregnant? When was your last menstrual period?       no  Protocols used: Chest Pain-A-AH

## 2023-12-18 ENCOUNTER — Encounter: Payer: Self-pay | Admitting: Internal Medicine

## 2023-12-18 ENCOUNTER — Ambulatory Visit: Payer: Self-pay | Admitting: Internal Medicine

## 2023-12-18 DIAGNOSIS — Z4802 Encounter for removal of sutures: Secondary | ICD-10-CM | POA: Diagnosis not present

## 2023-12-18 NOTE — Telephone Encounter (Signed)
   Chief Complaint: chest pain Symptoms: intermittent episodes of chest pain, dizziness, blood pressure changes, episodes of LOC (was seen in ED for this) Frequency: since last week Pertinent Negatives: Patient denies shortness of breath Disposition: [x] ED /[] Urgent Care (no appt availability in office) / [] Appointment(In office/virtual)/ []  [place] [facility]/ [] Home Care/ [] Refused Recommended Disposition /[] [place] [facility]/ []  Follow-up with PCP Additional Notes: Patient reports intermittent episodes of chest pain, dizziness, blood pressure changes, and episodes of LOC (was seen in ED for this last week). Patient reports she has been seen in ED multiple times in the last week for these symptoms and all scans have showed normal. Patient was advised by ED to follow-up with cardiology who have no available appts until March. Patient reports her episodes of chest pain are happening more frequently and lasting longer and she is very concerned that she will have another episode of LOC. Per protocol, this RN recommended patient be seen in ED for her symptoms. Patient verbalized understanding and said she would head there now.     Copied from CRM [id number]. Topic: Clinical - Red Word Triage >> Dec 18, 2023 10:41 AM Destiny Petty wrote: Red Word that prompted transfer to Nurse Triage: Patient having chest pain with tightness, having issues with high blood pressure Reason for Disposition  [1] Chest pain (or angina) comes and goes AND [2] is happening more often (increasing in frequency) or getting worse (increasing in severity)  (Exception: Chest pains that last only a few seconds.)  Answer Assessment - Initial Assessment Questions 1. LOCATION: Where does it hurt?       Tightness in center of chest 2. RADIATION: Does the pain go anywhere else? (e.g., into neck, jaw, arms, back)     none 3. ONSET: When did the chest pain begin? (Minutes, hours or days)      Last week 4. PATTERN:  Does the pain come and go, or has it been constant since it started?  Does it get worse with exertion?      Comes and goes 5. DURATION: How long does it last (e.g., seconds, minutes, hours)     Minutes to hours depending on episode 6. SEVERITY: How bad is the pain?  (e.g., Scale 1-10; mild, moderate, or severe)    - MILD (1-3): doesn't interfere with normal activities     - MODERATE (4-7): interferes with normal activities or awakens from sleep    - SEVERE (8-10): excruciating pain, unable to do any normal activities       moderate 7. CARDIAC RISK FACTORS: Do you have any history of heart problems or risk factors for heart disease? (e.g., angina, prior heart attack; diabetes, high blood pressure, high cholesterol, smoker, or strong family history of heart disease)     hypertension 8. PULMONARY RISK FACTORS: Do you have any history of lung disease?  (e.g., blood clots in lung, asthma, emphysema, birth control pills)     asthma 9. CAUSE: What do you think is causing the chest pain?     I'm not sure if this is related to my blood pressure which has been going between high and low 10. OTHER SYMPTOMS: Do you have any other symptoms? (e.g., dizziness, nausea, vomiting, sweating, fever, difficulty breathing, cough)       Dizziness, blood pressure changes, episodes of LOC (was seen in ED for this at the time)  Protocols used: Chest Pain-A-AH

## 2023-12-19 ENCOUNTER — Ambulatory Visit: Payer: Medicaid Other | Admitting: Physician Assistant

## 2023-12-19 ENCOUNTER — Ambulatory Visit: Payer: Self-pay | Admitting: Internal Medicine

## 2023-12-19 NOTE — Telephone Encounter (Signed)
  Chief Complaint: unstable BP and right sided facial droop Symptoms: right sided facial droop, intermittent tightness in chest, unsteady on feet, headaches, feels like her BP is low at night due to cold chill Frequency: x 1 week Pertinent Negatives: Patient denies SOB, changes in vision, dizziness Disposition: [x] ED /[] Urgent Care (no appt availability in office) / [] Appointment(In office/virtual)/ []  [place] [facility]/ [] Home Care/ [] Refused Recommended Disposition /[] [place] [facility]/ []  Follow-up with PCP Additional Notes: Patient reports elevated BP reading a few days ago at 165/107 but then states at home she feels like it is symptomatically low at night. Patient agreeable with disposition, states she is frustrated going to the ED and feels they are not taking her symptoms seriously. She states she will go to [facility] ED.   Copied from CRM [id number]. Topic: Clinical - Red Word Triage >> Dec 19, 2023  1:37 PM Delon DASEN wrote: Red Word that prompted transfer to Nurse Triage: blood pressure low, facial droop on right since Friday, light headed Reason for Disposition  [1] Weakness (i.e., paralysis, loss of muscle strength) of the face, arm / hand, or leg / foot on one side of the body AND [2] sudden onset AND [3] present now  (Exception: Bell's palsy suspected [i.e., weakness only on one side of the face, developing over hours to days, no other symptoms].)  Answer Assessment - Initial Assessment Questions 1. SYMPTOM: What is the main symptom you are concerned about? (e.g., weakness, numbness)     Right sided facial droop, seen without any facial expression.  2. ONSET: When did this start? (minutes, hours, days; while sleeping)     12/12/23 had syncopal episode and has been constant since then.  3. LAST NORMAL: When was the last time you (the patient) were normal (no symptoms)?     12/12/23 5:30 in the morning.  4. PATTERN Does this come and go, or has it been constant since it  started?  Is it present now?     Constant.  5. CARDIAC SYMPTOMS: Have you had any of the following symptoms: chest pain, difficulty breathing, palpitations?     Intermittent chest pain (tightness).  6. NEUROLOGIC SYMPTOMS: Have you had any of the following symptoms: headache, dizziness, vision loss, double vision, changes in speech, unsteady on your feet?     Changes in speech on Friday (resolved), changes in balance or feeling unsteady, headaches, feels like her BP is low at night due to feeling cold at night (states she has no readings with low BP), left arm numbness a few days ago resolved  7. OTHER SYMPTOMS: Do you have any other symptoms?     Denies.  8. PREGNANCY: Is there any chance you are pregnant? When was your last menstrual period?     Ablation and tubes tied, denies pregnancy.  Protocols used: Neurologic Deficit-A-AH

## 2023-12-22 NOTE — Telephone Encounter (Signed)
Pt was seen at ED

## 2023-12-26 ENCOUNTER — Other Ambulatory Visit: Payer: Self-pay

## 2023-12-26 ENCOUNTER — Telehealth: Payer: Self-pay | Admitting: Internal Medicine

## 2023-12-26 MED ORDER — SEMAGLUTIDE-WEIGHT MANAGEMENT 1.7 MG/0.75ML ~~LOC~~ SOAJ: 1.7000 mg | SUBCUTANEOUS | 0 refills | Status: DC

## 2023-12-26 NOTE — Telephone Encounter (Signed)
Patient called need med refill  Semaglutide-Weight Management 1.7 MG/0.75ML SOAJ   Pharmacy: [facility]

## 2023-12-26 NOTE — Telephone Encounter (Signed)
Refill sent   Patient notified

## 2023-12-27 ENCOUNTER — Emergency Department (HOSPITAL_COMMUNITY): Payer: Medicaid Other

## 2023-12-27 ENCOUNTER — Emergency Department (HOSPITAL_COMMUNITY)
Admission: EM | Admit: 2023-12-27 | Discharge: 2023-12-27 | Disposition: A | Discharge status: Home / Self Care | Payer: Medicaid Other | Attending: Emergency Medicine | Admitting: Emergency Medicine

## 2023-12-27 DIAGNOSIS — R072 Precordial pain: Secondary | ICD-10-CM | POA: Diagnosis not present

## 2023-12-27 DIAGNOSIS — M549 Dorsalgia, unspecified: Secondary | ICD-10-CM | POA: Diagnosis not present

## 2023-12-27 DIAGNOSIS — Z79899 Other long term (current) drug therapy: Secondary | ICD-10-CM | POA: Diagnosis not present

## 2023-12-27 DIAGNOSIS — R079 Chest pain, unspecified: Secondary | ICD-10-CM | POA: Diagnosis not present

## 2023-12-27 DIAGNOSIS — Z91040 Latex allergy status: Secondary | ICD-10-CM | POA: Insufficient documentation

## 2023-12-27 DIAGNOSIS — K5732 Diverticulitis of large intestine without perforation or abscess without bleeding: Secondary | ICD-10-CM | POA: Insufficient documentation

## 2023-12-27 DIAGNOSIS — K5792 Diverticulitis of intestine, part unspecified, without perforation or abscess without bleeding: Secondary | ICD-10-CM | POA: Diagnosis not present

## 2023-12-27 DIAGNOSIS — R000 Tachycardia, unspecified: Secondary | ICD-10-CM | POA: Diagnosis not present

## 2023-12-27 DIAGNOSIS — R0789 Other chest pain: Secondary | ICD-10-CM | POA: Diagnosis not present

## 2023-12-27 DIAGNOSIS — R1084 Generalized abdominal pain: Secondary | ICD-10-CM | POA: Diagnosis not present

## 2023-12-27 DIAGNOSIS — I10 Essential (primary) hypertension: Secondary | ICD-10-CM | POA: Insufficient documentation

## 2023-12-27 DIAGNOSIS — Z9049 Acquired absence of other specified parts of digestive tract: Secondary | ICD-10-CM | POA: Diagnosis not present

## 2023-12-27 DIAGNOSIS — R1032 Left lower quadrant pain: Secondary | ICD-10-CM | POA: Diagnosis not present

## 2023-12-27 DIAGNOSIS — J9811 Atelectasis: Secondary | ICD-10-CM | POA: Diagnosis not present

## 2023-12-27 LAB — CBC WITH DIFFERENTIAL/PLATELET
Abs Immature Granulocytes: 0.03 10*3/uL (ref 0.00–0.07)
Basophils Absolute: 0.1 10*3/uL (ref 0.0–0.1)
Basophils Relative: 1 %
Eosinophils Absolute: 0.2 10*3/uL (ref 0.0–0.5)
Eosinophils Relative: 2 %
HCT: 44.9 % (ref 36.0–46.0)
Hemoglobin: 15.4 g/dL — ABNORMAL HIGH (ref 12.0–15.0)
Immature Granulocytes: 0 %
Lymphocytes Relative: 20 %
Lymphs Abs: 2.1 10*3/uL (ref 0.7–4.0)
MCH: 32.7 pg (ref 26.0–34.0)
MCHC: 34.3 g/dL (ref 30.0–36.0)
MCV: 95.3 fL (ref 80.0–100.0)
Monocytes Absolute: 0.8 10*3/uL (ref 0.1–1.0)
Monocytes Relative: 8 %
Neutro Abs: 7.1 10*3/uL (ref 1.7–7.7)
Neutrophils Relative %: 69 %
Platelets: 272 10*3/uL (ref 150–400)
RBC: 4.71 MIL/uL (ref 3.87–5.11)
RDW: 11.9 % (ref 11.5–15.5)
WBC: 10.3 10*3/uL (ref 4.0–10.5)
nRBC: 0 % (ref 0.0–0.2)

## 2023-12-27 LAB — COMPREHENSIVE METABOLIC PANEL
ALT: 15 U/L (ref 0–44)
AST: 21 U/L (ref 15–41)
Albumin: 4 g/dL (ref 3.5–5.0)
Alkaline Phosphatase: 104 U/L (ref 38–126)
Anion gap: 7 (ref 5–15)
BUN: 10 mg/dL (ref 6–20)
CO2: 22 mmol/L (ref 22–32)
Calcium: 9.1 mg/dL (ref 8.9–10.3)
Chloride: 106 mmol/L (ref 98–111)
Creatinine, Ser: 0.69 mg/dL (ref 0.44–1.00)
GFR, Estimated: >60 mL/min (ref >60)
Glucose, Bld: 91 mg/dL (ref 70–99)
Potassium: 3.6 mmol/L (ref 3.5–5.1)
Sodium: 135 mmol/L (ref 135–145)
Total Bilirubin: 0.9 mg/dL (ref 0.0–1.2)
Total Protein: 7.6 g/dL (ref 6.5–8.1)

## 2023-12-27 LAB — URINALYSIS, ROUTINE W REFLEX MICROSCOPIC
Bilirubin Urine: NEGATIVE
Glucose, UA: NEGATIVE mg/dL
Ketones, ur: NEGATIVE mg/dL
Leukocytes,Ua: NEGATIVE
Nitrite: NEGATIVE
Protein, ur: NEGATIVE mg/dL
Specific Gravity, Urine: 1.005 (ref 1.005–1.030)
pH: 5 (ref 5.0–8.0)

## 2023-12-27 LAB — TROPONIN I (HIGH SENSITIVITY)
Troponin I (High Sensitivity): <2 ng/L (ref <18)
Troponin I (High Sensitivity): <2 ng/L (ref <18)

## 2023-12-27 LAB — D-DIMER, QUANTITATIVE: D-Dimer, Quant: 0.68 ug{FEU}/mL — ABNORMAL HIGH (ref 0.00–0.50)

## 2023-12-27 LAB — PREGNANCY, URINE: Preg Test, Ur: NEGATIVE

## 2023-12-27 MED ORDER — CIPROFLOXACIN HCL 500 MG PO TABS: 500.0000 mg | ORAL_TABLET | Freq: Two times a day (BID) | ORAL | 0 refills | Status: AC

## 2023-12-27 MED ORDER — ALUM & MAG HYDROXIDE-SIMETH 200-200-20 MG/5ML PO SUSP
30.0000 mL | Freq: Once | ORAL | Status: AC
  Administered 2023-12-27: 30 mL via ORAL
  Filled 2023-12-27: qty 30

## 2023-12-27 MED ORDER — METRONIDAZOLE 500 MG PO TABS
500.0000 mg | ORAL_TABLET | Freq: Once | ORAL | Status: AC
  Administered 2023-12-27: 500 mg via ORAL
  Filled 2023-12-27: qty 1

## 2023-12-27 MED ORDER — ONDANSETRON HCL 4 MG/2ML IJ SOLN
4.0000 mg | Freq: Once | INTRAMUSCULAR | Status: AC
  Administered 2023-12-27: 4 mg via INTRAVENOUS
  Filled 2023-12-27: qty 2

## 2023-12-27 MED ORDER — LIDOCAINE VISCOUS HCL 2 % MT SOLN
15.0000 mL | Freq: Once | OROMUCOSAL | Status: AC
  Administered 2023-12-27: 15 mL via ORAL
  Filled 2023-12-27: qty 15

## 2023-12-27 MED ORDER — METRONIDAZOLE 500 MG PO TABS: 500.0000 mg | ORAL_TABLET | Freq: Four times a day (QID) | ORAL | 0 refills | Status: AC

## 2023-12-27 MED ORDER — IOHEXOL 350 MG/ML SOLN
100.0000 mL | Freq: Once | INTRAVENOUS | Status: AC | PRN
  Administered 2023-12-27: 100 mL via INTRAVENOUS

## 2023-12-27 MED ORDER — CIPROFLOXACIN HCL 250 MG PO TABS
500.0000 mg | ORAL_TABLET | Freq: Once | ORAL | Status: AC
  Administered 2023-12-27: 500 mg via ORAL
  Filled 2023-12-27: qty 2

## 2023-12-27 NOTE — ED Provider Notes (Signed)
[place] EMERGENCY DEPARTMENT AT [facility] Provider Note   CSN: [id number] Arrival date & time: 12/27/23  1419     History Chief Complaint  Patient presents with  . Chest Pain    Destiny Petty is a 43 y.o. female.  Patient past history significant for morbid obesity, hypertension, tobacco use, GERD presents the emergency department with concerns of chest pain.  States that she has had primarily midsternal nonradiating chest pain since about January 3 of this year.  States that at that time, she had a feeling of generalized weakness and a suspected syncopal episode where she urinated on herself.  States that she continues to have this persistent discomfort without notable improvement.  Has not tried any medications at home.  Has been working with PCP for this particular concerns and currently has outpatient cardiology appointment scheduled for March.  She does also report that she had to decrease her amlodipine dose given some episodes of hypotension and went from 10 mg to 5 mg.   Chest Pain      Home Medications Prior to Admission medications   Medication Sig Start Date End Date Taking? Authorizing Provider  albuterol (VENTOLIN HFA) 108 (90 Base) MCG/ACT inhaler Inhale 2 puffs into the lungs every 6 (six) hours as needed for wheezing or shortness of breath. 07/16/23   Leath-Warren, Sadie Haber, NP  amLODipine (NORVASC) 10 MG tablet Take 1 tablet (10 mg total) by mouth daily. 12/01/23   Anabel Halon, MD  cetirizine (ZYRTEC) 10 MG tablet Take 1 tablet (10 mg total) by mouth daily. 03/21/23   Del Nigel Berthold, FNP  fluticasone (FLONASE) 50 MCG/ACT nasal spray Place 1 spray into both nostrils 2 (two) times daily. 11/09/23   Particia Nearing, PA-C  meloxicam (MOBIC) 7.5 MG tablet Take 1 tablet (7.5 mg total) by mouth daily. Patient not taking: Reported on 04/30/2023 02/25/23   Anabel Halon, MD  ondansetron (ZOFRAN) 4 MG tablet Take 1 tablet (4 mg total)  by mouth every 8 (eight) hours as needed for nausea or vomiting. 11/20/23   Billie Lade, MD  pantoprazole (PROTONIX) 40 MG tablet Take 1 tablet (40 mg total) by mouth daily. 11/20/23   Billie Lade, MD  rizatriptan (MAXALT-MLT) 10 MG disintegrating tablet TAKE 1 TABLET BY MOUTH ONCE AS NEEDED FOR MIGRAINE. MAY REPEAT IN 2 HOURS IF NEEDED. 12/01/23   Anabel Halon, MD  Semaglutide-Weight Management 1.7 MG/0.75ML SOAJ Inject 1.7 mg into the skin once a week. 12/26/23   Anabel Halon, MD  Tiotropium Bromide-Olodaterol 2.5-2.5 MCG/ACT AERS Inhale 2 puffs into the lungs daily. Patient not taking: Reported on 04/30/2023 03/21/23   Del Nigel Berthold, FNP  tiZANidine (ZANAFLEX) 4 MG capsule Take 1 capsule (4 mg total) by mouth 3 (three) times daily as needed for muscle spasms. Do not drink alcohol or drive while taking this medication.  May cause drowsiness. 12/02/23   Particia Nearing, PA-C      Allergies    Bee venom, Latex, and Lisinopril    Review of Systems   Review of Systems  Cardiovascular:  Positive for chest pain.  All other systems reviewed and are negative.   Physical Exam Updated Vital Signs BP 120/79 (BP Location: Left Arm)   Pulse 93   Temp 98.5 F (36.9 C) (Oral)   Resp 14   SpO2 97%  Physical Exam Vitals and nursing note reviewed.  Constitutional:      General: She  is not in acute distress.    Appearance: She is well-developed.  HENT:     Head: Normocephalic and atraumatic.  Eyes:     Conjunctiva/sclera: Conjunctivae normal.  Cardiovascular:     Rate and Rhythm: Normal rate and regular rhythm.     Heart sounds: No murmur heard. Pulmonary:     Effort: Pulmonary effort is normal. No respiratory distress.     Breath sounds: Normal breath sounds.  Abdominal:     Palpations: Abdomen is soft.     Tenderness: There is no abdominal tenderness.  Musculoskeletal:        General: No swelling.     Cervical back: Neck supple.  Skin:    General: Skin  is warm and dry.     Capillary Refill: Capillary refill takes less than 2 seconds.  Neurological:     Mental Status: She is alert.  Psychiatric:        Mood and Affect: Mood normal.    ED Results / Procedures / Treatments   Labs (all labs ordered are listed, but only abnormal results are displayed) Labs Reviewed  CBC WITH DIFFERENTIAL/PLATELET  COMPREHENSIVE METABOLIC PANEL  URINALYSIS, ROUTINE W REFLEX MICROSCOPIC  PREGNANCY, URINE  D-DIMER, QUANTITATIVE  TROPONIN I (HIGH SENSITIVITY)    EKG None  Radiology No results found.  Procedures Procedures    Medications Ordered in ED Medications  alum & mag hydroxide-simeth (MAALOX/MYLANTA) 200-200-20 MG/5ML suspension 30 mL (has no administration in time range)    And  lidocaine (XYLOCAINE) 2 % viscous mouth solution 15 mL (has no administration in time range)    ED Course/ Medical Decision Making/ A&P                                 Medical Decision Making Amount and/or Complexity of Data Reviewed Labs: ordered. Radiology: ordered.  Risk OTC drugs. Prescription drug management.   This patient presents to the ED for concern of chest pain.  Differential diagnosis includes ACS, PE, pneumonia, COVID-19, bronchitis   Lab Tests:  I Ordered, and personally interpreted labs.  The pertinent results include:  ***   Imaging Studies ordered:  I ordered imaging studies including chest x-ray I independently visualized and interpreted imaging which showed *** I agree with the radiologist interpretation   Medicines ordered and prescription drug management:  I ordered medication including Maalox, viscous lidocaine for ***  Reevaluation of the patient after these medicines showed that the patient {resolved/improved/worsened:23923::"improved"} I have reviewed the patients home medicines and have made adjustments as needed   Problem List / ED Course:  ***   Social Determinants of Health:    Final Clinical  Impression(s) / ED Diagnoses Final diagnoses:  None    Rx / DC Orders ED Discharge Orders     None

## 2023-12-27 NOTE — ED Triage Notes (Signed)
Pt c/o mid-sternal non-radiating CP since 1/3. Pt also c/o LLQ abd pain without N/V/D since yesterday.

## 2023-12-27 NOTE — Discharge Instructions (Signed)
You were seen in the ER today for chest pain and abdominal pain. Your cardiac workup was reassuring, however it appears you have diverticulitis on CT imaging of the abdomen. For this, management is typically conservative with fluids and soft diet with transition to regular diet, but we decided for antibiotic therapy for this episode. You will be taking ciprofloxacin and metronidazole. Please take these as prescribed. Please follow up with your primary care provider. Return to the ER for new or worsening symptoms.  Avoid taking tizanidine (Zanaflex) with the ciprofloxacin as it can make the side effects of tizanidine more pronounced.

## 2023-12-29 ENCOUNTER — Ambulatory Visit: Payer: Medicaid Other | Admitting: Internal Medicine

## 2023-12-29 ENCOUNTER — Encounter: Payer: Self-pay | Admitting: Internal Medicine

## 2023-12-29 VITALS — BP 134/90 | HR 61 | Ht 60.0 in | Wt 187.8 lb

## 2023-12-29 DIAGNOSIS — Z09 Encounter for follow-up examination after completed treatment for conditions other than malignant neoplasm: Secondary | ICD-10-CM | POA: Diagnosis not present

## 2023-12-29 DIAGNOSIS — K5792 Diverticulitis of intestine, part unspecified, without perforation or abscess without bleeding: Secondary | ICD-10-CM | POA: Diagnosis not present

## 2023-12-29 DIAGNOSIS — F331 Major depressive disorder, recurrent, moderate: Secondary | ICD-10-CM | POA: Diagnosis not present

## 2023-12-29 DIAGNOSIS — I10 Essential (primary) hypertension: Secondary | ICD-10-CM

## 2023-12-29 MED ORDER — LOSARTAN POTASSIUM 25 MG PO TABS
25.0000 mg | ORAL_TABLET | Freq: Every day | ORAL | 2 refills | Status: DC
Start: 2023-12-29 — End: 2024-03-25

## 2023-12-29 NOTE — Assessment & Plan Note (Signed)
Overall well-controlled currently, improving with weight loss Would avoid any antidepressant for now Has tried Prozac, BuSpar and Wellbutrin Has anxiety due to her workplace and her niece's recent leukemia diagnosis - advised to focus on regular sleep-wake cycle

## 2023-12-29 NOTE — Assessment & Plan Note (Signed)
Continue ciprofloxacin and metronidazole Advised to avoid [facility] for 1 week Maintain adequate hydration and eat at regular intervals Referred to GI for follow-up colonoscopy  after 6 weeks

## 2023-12-29 NOTE — Patient Instructions (Signed)
Please start taking Losartan 25 mg once daily.  Please get blood test done after 2 weeks.  Please continue to take other medications as prescribed.  Please continue to follow low carb diet and perform moderate exercise/walking at least 150 mins/week.  Please focus on maintaining sleep-wake cycle and try to get 7-8 hours of sleep.

## 2023-12-29 NOTE — Assessment & Plan Note (Signed)
ER charts reviewed, including blood tests and imaging Syncopal episode seems to be likely due to lack of sleep rather than hypotension -advised to focus on regular sleep-wake cycle, maintain adequate hydration and avoid skipping meals Diverticulitis-continue ciprofloxacin and metronidazole (only TID), advised to avoid taking [facility] for 1 week Chest pain and facial drooping have resolved, neurologic exam benign today

## 2023-12-29 NOTE — Assessment & Plan Note (Addendum)
BP Readings from Last 1 Encounters:  12/29/23 (!) 134/90   Uncontrolled with amlodipine 10 mg once daily Added Losartan 25 mg QD Counseled for compliance with the medications Advised DASH diet and moderate exercise/walking, at least 150 mins/week

## 2023-12-29 NOTE — Progress Notes (Signed)
Established Patient Office Visit  Subjective:  Patient ID: Destiny Petty, female    DOB: Mar 25, 1981  Age: 43 y.o. MRN: [id number]  CC:  Chief Complaint  Patient presents with   Care Management    Pt f/u for hypertension concerns, bp not under control, reports face drooping , went to Emergency room and sent her home. Reports bp reading elevated during the day, and at night time she has low blood pressure.     HPI Destiny Petty is a 43 y.o. female with past medical history of asthma, GERD, migraine, HLD, depression and morbid obesity who presents for f/u of her chronic medical conditions.  HTN: Her BP was elevated BP today. Her BP has been around 150s/100s at times. She has been taking amlodipine 10 mg QD regularly. She denies any chest pain or palpitations.   She had an episode of syncope and went to ER on 12/12/23. She reports feeling extremely stressed due to her niece's leukemia diagnosis, has not been able to sleep well lately. She has been stressed at work even before this already. She went to ER on 01/08 for chest pain, but troponins and EKG were unremarkable. She went to ER again on 01/18, had CT chest, abdomen and pelvis - has diverticulitis.  She is currently taking ciprofloxacin and metronidazole.  Migraine: She complains of intermittent episodes of headache, which is sharp, nonradiating, and is associated with severe nausea.  She has Maxalt as needed, which improves her headache  Asthma:  She uses Stiolto as maintenance inhaler and Albuterol PRN for dyspnea or wheezing.  Denies any fever or chills.  She takes Xyzal for allergies.  Obesity: She has responded well to [facility] and has lost about 21 lbs since 09/24. She has been taking Wegovy 1.7 mg dose.  She is trying to cut down soft drinks and has been following low-carb diet.  She complained of chronic fatigue due to recent weight gain in the past, which is improving.  She had worsening of acid reflux and nausea  with Wegovy, but has improved with pantoprazole.  MDD:  She has stopped taking Wellbutrin, which was started to help her quit smoking.  She still smokes, but reports that she has not been smoking on a daily basis.  Denies any SI or HI currently.  She attributed her mood symptoms to weight gain in the past, but now feels better with weight loss.     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 Scheduled for Cholecystectomy   GERD (gastroesophageal reflux disease)    Hemorrhoids 02/10/2015   Hiatal hernia    Hyperlipidemia 04/09/2011   Hypertension    no meds   Injury of digital nerve of right little finger 08/23/2020   Laceration of blood vessel of right little finger 08/23/2020   Laceration of finger of left hand without damage to nail  left small finger   Menorrhagia with regular cycle 04/10/2016   Migraine 01/15/2016   Overview:  Overview:  IMO Load 2016 R1.3   Muscle spasm of back 03/13/2016   Numbness and tingling in left hand 08/19/2016   Obesity    Ovarian cyst 12/18/2016   Perianal wart 02/10/2015   Stress 02/14/2016   Weight loss counseling, encounter for 03/13/2016    Past Surgical History:  Procedure Laterality Date   ABLATION     CESAREAN SECTION     CESAREAN SECTION N/A    Phreesia 05/23/2020   CHOLECYSTECTOMY     COLONOSCOPY     DILATATION AND CURETTAGE/HYSTEROSCOPY WITH MINERVA N/A 08/31/2019   Procedure: DILATATION AND  CURETTAGE (no specimen) /HYSTEROSCOPY WITH MINERVA ENDOMETRIAL ABLATION;  Surgeon: Tilda Burrow, MD;  Location: [facility];  Service: Gynecology;  Laterality: N/A;   ESOPHAGOGASTRODUODENOSCOPY ENDOSCOPY     HEMORRHOID SURGERY N/A 05/10/2022   Procedure: HEMORRHOIDECTOMY, EXTENSIVE;  Surgeon: Franky Macho, MD;  Location: [facility];  Service: General;  Laterality: N/A;   TENDON EXPLORATION Left 08/10/2020   Procedure: LEFT SMALL FINER EXPLORATION, REPAIR OF TENDON, ARTERY, NERVE;  Surgeon: Betha Loa, MD;  Location: [facility];  Service: Orthopedics;  Laterality: Left;   TUBAL LIGATION      Family History  Problem Relation Age of Onset   Diabetes Mother    Kidney failure Mother    Early death Mother 61       diabetes complications   Cancer Father 47       colon cancer   Depression Sister    Hypertension Sister    Anxiety disorder Sister    Other Sister        back problems   ADD / ADHD Daughter    Other Daughter        behavioral issues   ODD Daughter    Asthma Son    Cancer Maternal Grandmother        breast   Seizures Maternal Grandmother    Lupus Other    Breast cancer Maternal Aunt     Social History   Socioeconomic History   Marital status: Legally Separated    Spouse name: Not on file   Number of children: 2   Years of education: 14   Highest education level: Not on file  Occupational History   Not on file  Tobacco Use   Smoking status: Every Day    Current packs/day: 1.00    Average packs/day: 1 pack/day for 26.0 years (26.0 ttl pk-yrs)    Types: Cigarettes   Smokeless tobacco: Never   Tobacco comments:    1/4 ppd 05/30/21  Vaping Use   Vaping status: Never Used  Substance and Sexual Activity   Alcohol use: Yes    Comment: occ   Drug use: No   Sexual activity: Not Currently    Birth control/protection: Surgical    Comment: tubal  Other Topics Concern   Not on file  Social History Narrative   Legally separated right now   2 children: son  45 and daughter 19 lives with her      2 dogs: macho, mary jane   2 birds:    1 cats: boghgie       Enjoys: spending time with kids, outside things      Diet: eat all food groups    Caffeine: coffee and soda daily   Water: 4 cups daily      Wears seat belt  Handsfree for phone in car   Smoke detectors at home   No weapons        Social Drivers of Health   Financial Resource Strain: Low Risk  (06/01/2020)   Overall Financial Resource Strain (CARDIA)    Difficulty of Paying Living Expenses: Not hard at all  Food Insecurity: No Food Insecurity (06/01/2020)   Hunger Vital Sign    Worried About Running Out of Food in the Last Year: Never true    Ran Out of Food in the Last Year: Never true  Transportation Needs: No Transportation Needs (06/01/2020)   PRAPARE - Administrator, Civil Service (Medical): No    Lack of Transportation (Non-Medical): No  Physical Activity: Insufficiently Active (06/01/2020)   Exercise Vital Sign    Days of Exercise per Week: 2 days    Minutes of Exercise per Session: 30 min  Stress: No Stress Concern Present (06/01/2020)   [employer] of Occupational Health - Occupational Stress Questionnaire    Feeling of Stress : Not at all  Social Connections: Moderately Isolated (06/01/2020)   Social Connection and Isolation Panel [NHANES]    Frequency of Communication with Friends and Family: More than three times a week    Frequency of Social Gatherings with Friends and Family: More than three times a week    Attends Religious Services: More than 4 times per year    Active Member of [employer] or Organizations: No    Attends Banker Meetings: Never    Marital Status: Separated  Intimate Partner Violence: Not At Risk (06/01/2020)   Humiliation, Afraid, Rape, and Kick questionnaire    Fear of Current or Ex-Partner: No    Emotionally Abused: No    Physically Abused: No    Sexually Abused: No    Outpatient Medications Prior to Visit   Medication Sig Dispense Refill   albuterol (VENTOLIN HFA) 108 (90 Base) MCG/ACT inhaler Inhale 2 puffs into the lungs every 6 (six) hours as needed for wheezing or shortness of breath. 8 g 0   amLODipine (NORVASC) 10 MG tablet Take 1 tablet (10 mg total) by mouth daily. 90 tablet 3   cetirizine (ZYRTEC) 10 MG tablet Take 1 tablet (10 mg total) by mouth daily. 30 tablet 0   ciprofloxacin (CIPRO) 500 MG tablet Take 1 tablet (500 mg total) by mouth every 12 (twelve) hours for 5 days. 9 tablet 0   fluticasone (FLONASE) 50 MCG/ACT nasal spray Place 1 spray into both nostrils 2 (two) times daily. 16 g 2   meloxicam (MOBIC) 7.5 MG tablet Take 1 tablet (7.5 mg total) by mouth daily. 30 tablet 0   metroNIDAZOLE (FLAGYL) 500 MG tablet Take 1 tablet (500 mg total) by mouth 4 (four) times daily for 5 days. 19 tablet 0   ondansetron (ZOFRAN) 4 MG tablet Take 1 tablet (4 mg total) by mouth every 8 (eight) hours as needed for nausea or vomiting. 20 tablet 0   pantoprazole (PROTONIX) 40 MG tablet Take 1 tablet (40 mg total) by mouth daily. 30 tablet 2   rizatriptan (MAXALT-MLT) 10 MG disintegrating tablet TAKE 1 TABLET BY MOUTH ONCE AS NEEDED FOR MIGRAINE. MAY REPEAT IN 2 HOURS IF NEEDED. 10 tablet 2   Semaglutide-Weight Management 1.7 MG/0.75ML SOAJ Inject 1.7 mg into the skin once a week. 3 mL 0   Tiotropium Bromide-Olodaterol 2.5-2.5 MCG/ACT AERS Inhale 2 puffs into the lungs daily. 4 g 5   tiZANidine (ZANAFLEX) 4 MG capsule Take  1 capsule (4 mg total) by mouth 3 (three) times daily as needed for muscle spasms. Do not drink alcohol or drive while taking this medication.  May cause drowsiness. 15 capsule 0   No facility-administered medications prior to visit.    Allergies  Allergen Reactions   Bee Venom Shortness Of Breath   Latex Itching   Lisinopril Cough    ROS Review of Systems  Constitutional:  Positive for fatigue. Negative for chills and fever.  HENT:  Negative for congestion, sinus pressure  and sore throat.   Eyes:  Negative for pain and discharge.  Respiratory:  Negative for cough and shortness of breath.   Cardiovascular:  Negative for chest pain and palpitations.  Gastrointestinal:  Negative for abdominal pain, diarrhea, nausea and vomiting.  Endocrine: Negative for polydipsia and polyuria.  Genitourinary:  Negative for dysuria and hematuria.  Musculoskeletal:  Negative for neck pain and neck stiffness.  Skin:  Negative for rash.  Neurological:  Positive for headaches. Negative for dizziness and weakness.  Psychiatric/Behavioral:  Negative for agitation and behavioral problems. The patient is nervous/anxious.       Objective:    Physical Exam Vitals reviewed.  Constitutional:      General: She is not in acute distress.    Appearance: She is obese. She is not diaphoretic.  HENT:     Head: Normocephalic and atraumatic.     Nose: No congestion.     Mouth/Throat:     Mouth: Mucous membranes are moist.     Pharynx: No posterior oropharyngeal erythema.  Eyes:     General: No scleral icterus.    Extraocular Movements: Extraocular movements intact.  Cardiovascular:     Rate and Rhythm: Normal rate and regular rhythm.     Pulses: Normal pulses.     Heart sounds: Normal heart sounds. No murmur heard. Pulmonary:     Breath sounds: Normal breath sounds. No wheezing or rales.  Musculoskeletal:     Cervical back: Neck supple. No tenderness.     Right lower leg: No edema.     Left lower leg: No edema.  Skin:    General: Skin is warm.     Findings: No rash.  Neurological:     General: No focal deficit present.     Mental Status: She is alert and oriented to person, place, and time.     Cranial Nerves: No cranial nerve deficit.     Sensory: No sensory deficit.     Motor: No weakness.  Psychiatric:        Mood and Affect: Mood normal.        Behavior: Behavior normal.     BP (!) 134/90 (BP Location: Left Arm)   Pulse 61   Ht 5' (1.524 m)   Wt 187 lb 12.8 oz  (85.2 kg)   SpO2 90%   BMI 36.68 kg/m  Wt Readings from Last 3 Encounters:  12/29/23 187 lb 12.8 oz (85.2 kg)  12/17/23 189 lb (85.7 kg)  12/12/23 188 lb (85.3 kg)    Lab Results  Component Value Date   TSH 2.830 01/13/2023   Lab Results  Component Value Date   WBC 10.3 12/27/2023   HGB 15.4 (H) 12/27/2023   HCT 44.9 12/27/2023   MCV 95.3 12/27/2023   PLT 272 12/27/2023   Lab Results  Component Value Date   NA 135 12/27/2023   K 3.6 12/27/2023   CO2 22 12/27/2023   GLUCOSE 91 12/27/2023   BUN  10 12/27/2023   CREATININE 0.69 12/27/2023   BILITOT 0.9 12/27/2023   ALKPHOS 104 12/27/2023   AST 21 12/27/2023   ALT 15 12/27/2023   PROT 7.6 12/27/2023   ALBUMIN 4.0 12/27/2023   CALCIUM 9.1 12/27/2023   ANIONGAP 7 12/27/2023   EGFR 82 12/01/2023   Lab Results  Component Value Date   CHOL 180 12/01/2023   Lab Results  Component Value Date   HDL 37 (L) 12/01/2023   Lab Results  Component Value Date   LDLCALC 110 (H) 12/01/2023   Lab Results  Component Value Date   TRIG 186 (H) 12/01/2023   Lab Results  Component Value Date   CHOLHDL 4.9 (H) 12/01/2023   Lab Results  Component Value Date   HGBA1C 5.4 12/01/2023      Assessment & Plan:   Problem List Items Addressed This Visit       Cardiovascular and Mediastinum   Essential hypertension - Primary   BP Readings from Last 1 Encounters:  12/29/23 (!) 134/90   Uncontrolled with amlodipine 10 mg once daily Added Losartan 25 mg QD Counseled for compliance with the medications Advised DASH diet and moderate exercise/walking, at least 150 mins/week      Relevant Medications   losartan (COZAAR) 25 MG tablet   Other Relevant Orders   Basic Metabolic Panel (BMET)     Other   Recurrent major depressive disorder (HCC) (Chronic)   Overall well-controlled currently, improving with weight loss Would avoid any antidepressant for now Has tried Prozac, BuSpar and Wellbutrin Has anxiety due to her  workplace and her niece's recent leukemia diagnosis - advised to focus on regular sleep-wake cycle      Encounter for examination following treatment at hospital   ER charts reviewed, including blood tests and imaging Syncopal episode seems to be likely due to lack of sleep rather than hypotension -advised to focus on regular sleep-wake cycle, maintain adequate hydration and avoid skipping meals Diverticulitis-continue ciprofloxacin and metronidazole (only TID), advised to avoid taking [facility] for 1 week Chest pain and facial drooping have resolved, neurologic exam benign today      Diverticulitis   Continue ciprofloxacin and metronidazole Advised to avoid [facility] for 1 week Maintain adequate hydration and eat at regular intervals Referred to GI for follow-up colonoscopy  after 6 weeks      Relevant Orders   Ambulatory referral to Gastroenterology      Meds ordered this encounter  Medications   losartan (COZAAR) 25 MG tablet    Sig: Take 1 tablet (25 mg total) by mouth daily.    Dispense:  30 tablet    Refill:  2    Follow-up: Return if symptoms worsen or fail to improve.    Anabel Halon, MD

## 2024-01-01 ENCOUNTER — Encounter (INDEPENDENT_AMBULATORY_CARE_PROVIDER_SITE_OTHER): Payer: Self-pay | Admitting: *Deleted

## 2024-01-04 DIAGNOSIS — F17210 Nicotine dependence, cigarettes, uncomplicated: Secondary | ICD-10-CM | POA: Diagnosis not present

## 2024-01-04 DIAGNOSIS — K5792 Diverticulitis of intestine, part unspecified, without perforation or abscess without bleeding: Secondary | ICD-10-CM | POA: Diagnosis not present

## 2024-01-04 DIAGNOSIS — R079 Chest pain, unspecified: Secondary | ICD-10-CM | POA: Diagnosis not present

## 2024-01-04 DIAGNOSIS — K5732 Diverticulitis of large intestine without perforation or abscess without bleeding: Secondary | ICD-10-CM | POA: Diagnosis not present

## 2024-01-07 ENCOUNTER — Emergency Department (HOSPITAL_COMMUNITY): Payer: Medicaid Other

## 2024-01-07 ENCOUNTER — Ambulatory Visit (INDEPENDENT_AMBULATORY_CARE_PROVIDER_SITE_OTHER): Payer: Medicaid Other | Admitting: Gastroenterology

## 2024-01-07 ENCOUNTER — Encounter (HOSPITAL_COMMUNITY): Payer: Self-pay

## 2024-01-07 ENCOUNTER — Other Ambulatory Visit: Payer: Self-pay

## 2024-01-07 ENCOUNTER — Encounter (INDEPENDENT_AMBULATORY_CARE_PROVIDER_SITE_OTHER): Payer: Self-pay | Admitting: Gastroenterology

## 2024-01-07 ENCOUNTER — Emergency Department (HOSPITAL_COMMUNITY)
Admission: EM | Admit: 2024-01-07 | Discharge: 2024-01-07 | Disposition: A | Discharge status: Home / Self Care | Payer: Medicaid Other | Attending: Emergency Medicine | Admitting: Emergency Medicine

## 2024-01-07 VITALS — BP 134/88 | HR 102 | Temp 98.4°F | Ht 60.0 in | Wt 188.4 lb

## 2024-01-07 DIAGNOSIS — Z91040 Latex allergy status: Secondary | ICD-10-CM | POA: Diagnosis not present

## 2024-01-07 DIAGNOSIS — R55 Syncope and collapse: Secondary | ICD-10-CM | POA: Diagnosis not present

## 2024-01-07 DIAGNOSIS — Z6837 Body mass index (BMI) 37.0-37.9, adult: Secondary | ICD-10-CM | POA: Insufficient documentation

## 2024-01-07 DIAGNOSIS — R109 Unspecified abdominal pain: Secondary | ICD-10-CM | POA: Diagnosis not present

## 2024-01-07 DIAGNOSIS — K5732 Diverticulitis of large intestine without perforation or abscess without bleeding: Secondary | ICD-10-CM

## 2024-01-07 DIAGNOSIS — R1032 Left lower quadrant pain: Secondary | ICD-10-CM | POA: Diagnosis present

## 2024-01-07 DIAGNOSIS — R1084 Generalized abdominal pain: Secondary | ICD-10-CM | POA: Diagnosis not present

## 2024-01-07 DIAGNOSIS — K5904 Chronic idiopathic constipation: Secondary | ICD-10-CM | POA: Insufficient documentation

## 2024-01-07 DIAGNOSIS — Z9049 Acquired absence of other specified parts of digestive tract: Secondary | ICD-10-CM | POA: Diagnosis not present

## 2024-01-07 DIAGNOSIS — K219 Gastro-esophageal reflux disease without esophagitis: Secondary | ICD-10-CM

## 2024-01-07 HISTORY — DX: Diverticulitis of intestine, part unspecified, without perforation or abscess without bleeding: K57.92

## 2024-01-07 LAB — URINALYSIS, ROUTINE W REFLEX MICROSCOPIC
Bilirubin Urine: NEGATIVE
Glucose, UA: NEGATIVE mg/dL
Hgb urine dipstick: NEGATIVE
Ketones, ur: NEGATIVE mg/dL
Leukocytes,Ua: NEGATIVE
Nitrite: NEGATIVE
Protein, ur: NEGATIVE mg/dL
Specific Gravity, Urine: 1.01 (ref 1.005–1.030)
pH: 6 (ref 5.0–8.0)

## 2024-01-07 LAB — CBC WITH DIFFERENTIAL/PLATELET
Abs Immature Granulocytes: 0.04 10*3/uL (ref 0.00–0.07)
Basophils Absolute: 0.1 10*3/uL (ref 0.0–0.1)
Basophils Relative: 1 %
Eosinophils Absolute: 0.3 10*3/uL (ref 0.0–0.5)
Eosinophils Relative: 3 %
HCT: 46.8 % — ABNORMAL HIGH (ref 36.0–46.0)
Hemoglobin: 16 g/dL — ABNORMAL HIGH (ref 12.0–15.0)
Immature Granulocytes: 0 %
Lymphocytes Relative: 22 %
Lymphs Abs: 2.7 10*3/uL (ref 0.7–4.0)
MCH: 33 pg (ref 26.0–34.0)
MCHC: 34.2 g/dL (ref 30.0–36.0)
MCV: 96.5 fL (ref 80.0–100.0)
Monocytes Absolute: 0.6 10*3/uL (ref 0.1–1.0)
Monocytes Relative: 5 %
Neutro Abs: 8.7 10*3/uL — ABNORMAL HIGH (ref 1.7–7.7)
Neutrophils Relative %: 69 %
Platelets: 332 10*3/uL (ref 150–400)
RBC: 4.85 MIL/uL (ref 3.87–5.11)
RDW: 12.6 % (ref 11.5–15.5)
WBC: 12.4 10*3/uL — ABNORMAL HIGH (ref 4.0–10.5)
nRBC: 0 % (ref 0.0–0.2)

## 2024-01-07 LAB — COMPREHENSIVE METABOLIC PANEL
ALT: 16 U/L (ref 0–44)
AST: 20 U/L (ref 15–41)
Albumin: 4.3 g/dL (ref 3.5–5.0)
Alkaline Phosphatase: 88 U/L (ref 38–126)
Anion gap: 9 (ref 5–15)
BUN: 9 mg/dL (ref 6–20)
CO2: 23 mmol/L (ref 22–32)
Calcium: 9.4 mg/dL (ref 8.9–10.3)
Chloride: 105 mmol/L (ref 98–111)
Creatinine, Ser: 0.63 mg/dL (ref 0.44–1.00)
GFR, Estimated: >60 mL/min (ref >60)
Glucose, Bld: 83 mg/dL (ref 70–99)
Potassium: 3.9 mmol/L (ref 3.5–5.1)
Sodium: 137 mmol/L (ref 135–145)
Total Bilirubin: 0.6 mg/dL (ref 0.0–1.2)
Total Protein: 7.3 g/dL (ref 6.5–8.1)

## 2024-01-07 MED ORDER — IOHEXOL 300 MG/ML  SOLN
100.0000 mL | Freq: Once | INTRAMUSCULAR | Status: AC | PRN
  Administered 2024-01-07: 100 mL via INTRAVENOUS

## 2024-01-07 MED ORDER — HYDROCODONE-ACETAMINOPHEN 5-325 MG PO TABS: 1.0000 | ORAL_TABLET | Freq: Four times a day (QID) | ORAL | 0 refills | Status: DC | PRN

## 2024-01-07 NOTE — ED Provider Notes (Addendum)
EMERGENCY DEPARTMENT AT [facility] Provider Note   CSN: [id number] Arrival date & time: 01/07/24  1124     History  Chief Complaint  Patient presents with   Abdominal Pain    Destiny Petty is a 43 y.o. female.  Patient with persistent abdominal pain since January 3.  Patient seen in the emergency department here on January 18 consistent with diverticulitis.  Started on Cipro and Flagyl.  Patient seen at [facility] on January 26 CT scan was repeated showed significant improvement.  Patient seen by her GI doctor today in the office Dr. Shirline Frees here in [place] he sent her over for repeat CT scan in case there was a complication.  Patient states she is also been having trouble with syncope and the persistent pain.  Her GI doctor is aware of both.       Home Medications Prior to Admission medications   Medication Sig Start Date End Date Taking? Authorizing Provider  albuterol (VENTOLIN HFA) 108 (90 Base) MCG/ACT inhaler Inhale 2 puffs into the lungs every 6 (six) hours as needed for wheezing or shortness of breath. 07/16/23   Leath-Warren, Sadie Haber, NP  amLODipine (NORVASC) 10 MG tablet Take 1 tablet (10 mg total) by mouth daily. 12/01/23   Anabel Halon, MD  cetirizine (ZYRTEC) 10 MG tablet Take 1 tablet (10 mg total) by mouth daily. 03/21/23   Del Nigel Berthold, FNP  fluticasone (FLONASE) 50 MCG/ACT nasal spray Place 1 spray into both nostrils 2 (two) times daily. 11/09/23   Particia Nearing, PA-C  losartan (COZAAR) 25 MG tablet Take 1 tablet (25 mg total) by mouth daily. 12/29/23   Anabel Halon, MD  ondansetron (ZOFRAN) 4 MG tablet Take 1 tablet (4 mg total) by mouth every 8 (eight) hours as needed for nausea or vomiting. 11/20/23   Billie Lade, MD  pantoprazole (PROTONIX) 40 MG tablet Take 1 tablet (40 mg total) by mouth daily. 11/20/23   Billie Lade, MD  rizatriptan (MAXALT-MLT) 10 MG disintegrating tablet TAKE 1  TABLET BY MOUTH ONCE AS NEEDED FOR MIGRAINE. MAY REPEAT IN 2 HOURS IF NEEDED. 12/01/23   Anabel Halon, MD  Semaglutide-Weight Management 1.7 MG/0.75ML SOAJ Inject 1.7 mg into the skin once a week. Patient not taking: Reported on 01/07/2024 12/26/23   Anabel Halon, MD  Tiotropium Bromide-Olodaterol 2.5-2.5 MCG/ACT AERS Inhale 2 puffs into the lungs daily. 03/21/23   Del Nigel Berthold, FNP      Allergies    Bee venom, Latex, and Lisinopril    Review of Systems   Review of Systems  Constitutional:  Negative for chills and fever.  HENT:  Negative for ear pain and sore throat.   Eyes:  Negative for pain and visual disturbance.  Respiratory:  Negative for cough and shortness of breath.   Cardiovascular:  Negative for chest pain and palpitations.  Gastrointestinal:  Positive for abdominal pain. Negative for vomiting.  Genitourinary:  Negative for dysuria and hematuria.  Musculoskeletal:  Negative for arthralgias and back pain.  Skin:  Negative for color change and rash.  Neurological:  Positive for syncope. Negative for seizures.  All other systems reviewed and are negative.   Physical Exam Updated Vital Signs Ht 1.524 m (5')   Wt 85.4 kg   BMI 36.77 kg/m  Physical Exam Vitals and nursing note reviewed.  Constitutional:      General: She is not in acute distress.  Appearance: She is well-developed.  HENT:     Head: Normocephalic and atraumatic.  Eyes:     Conjunctiva/sclera: Conjunctivae normal.  Cardiovascular:     Rate and Rhythm: Normal rate and regular rhythm.     Heart sounds: No murmur heard. Pulmonary:     Effort: Pulmonary effort is normal. No respiratory distress.     Breath sounds: Normal breath sounds.  Abdominal:     Palpations: Abdomen is soft.     Tenderness: There is abdominal tenderness in the left lower quadrant. There is no guarding.  Musculoskeletal:        General: No swelling.     Cervical back: Neck supple.  Skin:    General: Skin is warm  and dry.     Capillary Refill: Capillary refill takes less than 2 seconds.  Neurological:     General: No focal deficit present.     Mental Status: She is alert and oriented to person, place, and time.  Psychiatric:        Mood and Affect: Mood normal.     ED Results / Procedures / Treatments   Labs (all labs ordered are listed, but only abnormal results are displayed) Labs Reviewed  CBC WITH DIFFERENTIAL/PLATELET - Abnormal; Notable for the following components:      Result Value   WBC 12.4 (*)    Hemoglobin 16.0 (*)    HCT 46.8 (*)    Neutro Abs 8.7 (*)    All other components within normal limits  URINALYSIS, ROUTINE W REFLEX MICROSCOPIC - Abnormal; Notable for the following components:   APPearance HAZY (*)    All other components within normal limits  COMPREHENSIVE METABOLIC PANEL    EKG None  Radiology CT ABDOMEN PELVIS W CONTRAST Result Date: 01/07/2024 CLINICAL DATA:  Abdominal pain EXAM: CT ABDOMEN AND PELVIS WITH CONTRAST TECHNIQUE: Multidetector CT imaging of the abdomen and pelvis was performed using the standard protocol following bolus administration of intravenous contrast. RADIATION DOSE REDUCTION: This exam was performed according to the departmental dose-optimization program which includes automated exposure control, adjustment of the mA and/or kV according to patient size and/or use of iterative reconstruction technique. CONTRAST:  OMNIPAQUE IOHEXOL 300 MG/ML  SOLN COMPARISON:  12/27/2023 FINDINGS: Lower chest: No acute abnormality. Hepatobiliary: No focal liver abnormality is seen. Status post cholecystectomy. No biliary dilatation. Pancreas: Unremarkable. No pancreatic ductal dilatation or surrounding inflammatory changes. Spleen: Normal in size without focal abnormality. Adrenals/Urinary Tract: Unremarkable adrenal glands. Kidneys enhance symmetrically without focal lesion, stone, or hydronephrosis. Ureters are nondilated. Urinary bladder appears  unremarkable for the degree of distention. Stomach/Bowel: Unremarkable appearance of the stomach. No dilated loops of bowel. Normal appendix in the right lower quadrant. Colonic diverticulosis. Previously seen sites of diverticulitis involving the distal descending colon and proximal sigmoid colon have nearly resolved. Minimal residual bowel wall thickening and fat stranding remain. No new or progressive sites of bowel inflammation. No pericolonic fluid collections or extraluminal air. Vascular/Lymphatic: No significant vascular findings are present. No enlarged abdominal or pelvic lymph nodes. Reproductive: Anteverted uterus. Small left adnexal cysts or follicles which do not require follow-up imaging. No right adnexal abnormality. Other: No free fluid. No abdominopelvic fluid collection. No pneumoperitoneum. Musculoskeletal: No new or acute bony abnormality. IMPRESSION: Colonic diverticulosis. Previously seen sites of diverticulitis involving the distal descending colon and proximal sigmoid colon have nearly resolved. Minimal residual bowel wall thickening and fat stranding remain. No new or progressive sites of bowel inflammation. Electronically Signed   By:  Nicholas  Plundo D.O.   On: 01/07/2024 16:08   DG Abdomen 1 View Result Date: 01/07/2024 CLINICAL DATA:  Stomach pain. EXAM: ABDOMEN - 1 VIEW COMPARISON:  None Available. FINDINGS: The bowel gas pattern is non-obstructive. There is small-to-moderate stool burden, mainly in the ascending colon, compatible with colonic hypomotility. No evidence of pneumoperitoneum, within the limitations of a supine film. No acute osseous abnormalities. The soft tissues are within normal limits. Surgical changes, devices, tubes and lines: None. IMPRESSION: *Nonobstructive bowel gas pattern.  Small-to-moderate stool burden. Electronically Signed   By: Jules Schick M.D.   On: 01/07/2024 14:55    Procedures Procedures    Medications Ordered in ED Medications  iohexol  (OMNIPAQUE) 300 MG/ML solution 100 mL (100 mLs Intravenous Contrast Given 01/07/24 1435)    ED Course/ Medical Decision Making/ A&P                                 Medical Decision Making Amount and/or Complexity of Data Reviewed Labs: ordered. Radiology: ordered.   Complete metabolic panel here is normal.  CBC white count 12.4 not significantly changed from before hemoglobin 16 platelets are 332.  Urinalysis negative.  CT scan of the abdomen and pelvis shows colonic diverticulosis previous seen sites of diverticulitis involving the distal descending colon and the proximal sigmoid colon have nearly resolved.  Minimal residual bowel wall thickening and fat stranding remain no new or progressive sites of bowel inflammation.  Based on this she would clinically suspected the pain is not related to diverticulitis certainly no complication.  Will discuss with on-call gastroenterology since referred in by Dr. Shirline Frees.,  Will discuss with on-call gastroenterologist.  Discussed with Dr. Jena Gauss.  He agrees patient can be discharged home we will give her a prepack of hydrocodone she can follow-up with Dr. Rebecca Eaton office in the morning.  Also would recommend that she follow back up with her primary care doctor for further outpatient evaluation.  He did say it is possible that she could still be having some discomfort related to the diverticulitis.  But does not feel that she needs additional antibiotics at all.   Final Clinical Impression(s) / ED Diagnoses Final diagnoses:  Generalized abdominal pain    Rx / DC Orders ED Discharge Orders     None         Vanetta Mulders, MD 01/07/24 2105    Vanetta Mulders, MD 01/07/24 2130

## 2024-01-07 NOTE — ED Triage Notes (Signed)
Pt arrived via POV c/o on-going abdominal pain since 12/12/23. Pt reports being seen multiple times for this. Recently finished a course of antibiotics for diverticulitis.

## 2024-01-07 NOTE — ED Notes (Signed)
Reviewed D/C information with the patient, pt verbalized understanding. No additional concerns at this time.   Pt provided prepack, signed, and witnessed by this RN.

## 2024-01-07 NOTE — Progress Notes (Signed)
Vista Lawman , M.D. Gastroenterology & Hepatology [facility] Gastroenterology [address] [place], [place] [address] Primary Care Physician: Anabel Halon, MD [address] [place] [address]  Chief Complaint:  Worsening abdominal pain , recent diverticulitis   History of Present Illness:  Destiny Petty is a 43 y.o. female with asthma GERD, migraine, obesity who presents for evaluation of worsening abdominal pain with recent diagnosis of diverticulitis  Patient reports Jan 03 she started to feel unwell was getting out of bed and had total blackout where she fell and injured her finger.  This was followed by lower abdominal pain.  Patient reports further 3 more episodes of syncope  Patient reports she was seen in the ER 10 days ago for abdominal pain where she took and completed course of ciprofloxacin and metronidazole.  Patient reports since then her abdominal pain has worsened significantly and now is generalized.  Denies any fever or chills or hematochezia.  Patient is in much distress in the clinic and crying profusely.  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 Scheduled for Cholecystectomy   GERD (gastroesophageal reflux disease)    Hemorrhoids 02/10/2015   Hiatal hernia    Hyperlipidemia 04/09/2011   Hypertension    no meds   Injury of digital nerve of right little finger 08/23/2020   Laceration of blood vessel of right little finger 08/23/2020   Laceration of finger of left hand without damage to nail    left small finger   Menorrhagia with regular cycle 04/10/2016   Migraine 01/15/2016   Overview:  Overview:  IMO Load 2016 R1.3   Muscle spasm of back 03/13/2016   Numbness and tingling in left hand 08/19/2016   Obesity    Ovarian cyst 12/18/2016   Perianal wart 02/10/2015   Stress 02/14/2016   Weight loss counseling, encounter for 03/13/2016    Past Surgical History: Past Surgical History:  Procedure Laterality Date   ABLATION     CESAREAN SECTION     CESAREAN SECTION N/A    Phreesia 05/23/2020   CHOLECYSTECTOMY     COLONOSCOPY     DILATATION AND CURETTAGE/HYSTEROSCOPY WITH MINERVA N/A 08/31/2019   Procedure: DILATATION AND CURETTAGE (no specimen) /HYSTEROSCOPY WITH MINERVA ENDOMETRIAL ABLATION;  Surgeon: Tilda Burrow, MD;  Location: [facility];  Service: Gynecology;  Laterality: N/A;   ESOPHAGOGASTRODUODENOSCOPY ENDOSCOPY     HEMORRHOID SURGERY N/A 05/10/2022   Procedure: HEMORRHOIDECTOMY, EXTENSIVE;  Surgeon: Franky Macho, MD;  Location: [facility];  Service: General;  Laterality: N/A;   TENDON EXPLORATION Left 08/10/2020   Procedure: LEFT SMALL  FINER EXPLORATION, REPAIR OF TENDON, ARTERY, NERVE;  Surgeon: Betha Loa, MD;  Location: [facility];  Service: Orthopedics;  Laterality: Left;   TUBAL LIGATION      Family History: Family History  Problem Relation Age of Onset   Diabetes Mother    Kidney failure Mother    Early death Mother 57       diabetes complications   Cancer Father 73       colon cancer   Depression Sister    Hypertension Sister    Anxiety disorder  Sister    Other Sister        back problems   ADD / ADHD Daughter    Other Daughter        behavioral issues   ODD Daughter    Asthma Son    Cancer Maternal Grandmother        breast   Seizures Maternal Grandmother    Lupus Other    Breast cancer Maternal Aunt     Social History: Social History   Tobacco Use  Smoking Status Every Day   Current packs/day: 1.00   Average packs/day: 1 pack/day for 26.0 years (26.0 ttl pk-yrs)   Types: Cigarettes  Smokeless Tobacco Never  Tobacco Comments   1/4 ppd 05/30/21   Social History   Substance and Sexual Activity  Alcohol Use Yes   Comment: occ   Social History   Substance and Sexual Activity  Drug Use No    Allergies: Allergies  Allergen Reactions   Bee Venom Shortness Of Breath   Latex Itching   Lisinopril Cough    Medications: Current Outpatient Medications  Medication Sig Dispense Refill   albuterol (VENTOLIN HFA) 108 (90 Base) MCG/ACT inhaler Inhale 2 puffs into the lungs every 6 (six) hours as needed for wheezing or shortness of breath. 8 g 0   amLODipine (NORVASC) 10 MG tablet Take 1 tablet (10 mg total) by mouth daily. 90 tablet 3   cetirizine (ZYRTEC) 10 MG tablet Take 1 tablet (10 mg total) by mouth daily. 30 tablet 0   fluticasone (FLONASE) 50 MCG/ACT nasal spray Place 1 spray into both nostrils 2 (two) times daily. 16 g 2   losartan (COZAAR) 25 MG tablet Take 1 tablet (25 mg total) by mouth daily. 30 tablet 2   ondansetron (ZOFRAN) 4 MG tablet Take 1 tablet (4 mg total) by mouth every 8 (eight) hours as needed for nausea or vomiting. 20 tablet 0   pantoprazole (PROTONIX) 40 MG tablet Take 1 tablet (40 mg total) by mouth daily. 30 tablet 2   rizatriptan (MAXALT-MLT) 10 MG disintegrating tablet TAKE 1 TABLET BY MOUTH ONCE AS NEEDED FOR MIGRAINE. MAY REPEAT IN 2 HOURS IF NEEDED. 10 tablet 2   Tiotropium Bromide-Olodaterol 2.5-2.5 MCG/ACT AERS Inhale 2 puffs into the lungs daily. 4 g 5   Semaglutide-Weight  Management 1.7 MG/0.75ML SOAJ Inject 1.7 mg into the skin once a week. (Patient not taking: Reported on 01/07/2024) 3 mL 0   No current facility-administered medications for this visit.    Review of Systems: GENERAL: negative for malaise, night sweats HEENT: No changes in hearing or vision, no nose bleeds or other nasal problems. NECK: Negative for lumps, goiter, pain and significant neck swelling RESPIRATORY: Negative for cough, wheezing CARDIOVASCULAR: Negative for chest pain, leg swelling, palpitations, orthopnea GI: SEE HPI MUSCULOSKELETAL: Negative for joint pain or swelling, back pain, and muscle pain. SKIN: Negative for lesions, rash HEMATOLOGY Negative for prolonged bleeding, bruising  easily, and swollen nodes. ENDOCRINE: Negative for cold or heat intolerance, polyuria, polydipsia and goiter. NEURO: negative for tremor, gait imbalance, syncope and seizures. The remainder of the review of systems is noncontributory.   Physical Exam: BP 134/88   Pulse (!) 102   Temp 98.4 F (36.9 C) (Oral)   Ht 5' (1.524 m)   Wt 188 lb 6.4 oz (85.5 kg)   BMI 36.79 kg/m  GENERAL: The patient is AO x3, in no acute distress. HEENT: Head is normocephalic and atraumatic. EOMI are intact. Mouth is well hydrated and without lesions. NECK: Supple. No masses LUNGS: Clear to auscultation. No presence of rhonchi/wheezing/rales. Adequate chest expansion HEART: RRR, normal s1 and s2. ABDOMEN: Soft, generalized tenderness with rebound  No masses.   Imaging/Labs: as above     Latest Ref Rng & Units 12/27/2023    3:35 PM 12/17/2023    1:08 PM 12/12/2023    3:03 AM  CBC  WBC 4.0 - 10.5 K/uL 10.3  9.5  12.1   Hemoglobin 12.0 - 15.0 g/dL 16.1  09.6  04.5   Hematocrit 36.0 - 46.0 % 44.9  46.5  45.3   Platelets 150 - 400 K/uL 272  303  235    No results found for: "IRON", "TIBC", "FERRITIN"  I personally reviewed and interpreted the available labs, imaging and endoscopic files.  CT Abdomen and  pelvis with IV contrast 12/27/2023  IMPRESSION: There are two sites of acute uncomplicated diverticulitis involving the distal descending colon and the proximal sigmoid colon.  Gastric emptying study 2018   Imaging at 4 hours was not performed due to identification of normal gastric emptying at 3 hours.   29% emptied at 1 hr ( normal >= 10%)   65% emptied at 2 hr ( normal >= 40%)   95% emptied at 3 hr ( normal >= 70%)   N/A emptied at 4 hr ( normal >= 90%)   IMPRESSION: Normal gastric emptying study.  Impression and Plan:   YAA DONNELLAN is a 44 y.o. female with asthma GERD, migraine, obesity who presents for evaluation of worsening abdominal pain with recent diagnosis of diverticulitis  #Diverticulitis #Abdominal pain  Patient was diagnosed with uncomplicated descending sigmoid colon diverticulitis on 12/27/2023.  Completed course of ciprofloxacin and metronidazole.  Patient is in significant distress in clinic today with complaining of severe abdominal pain and exam significant for diffuse tenderness with rebound  At this time imperative to rule out complicated diverticulitis such as abscess or perforation.  Patient have failed to improve with p.o. antibiotics and may require IV antibiotics at this time with repeat CT abdomen pelvis with IV contrast  Discussed with ER doctor Dr. Glo Herring the case and patient agreeing to go to the ER given significant symptoms and impressive exam  Colonoscopy 6 to 8 weeks depending on CT scan results today  #Syncope  Patient reports 4 episodes of syncope in past 3 weeks, during 1 episode injured her finger as well.  From history appears to be vasovagal but given persistent symptoms would recommend cardiology evaluation, will refer  We will address other chronic issues such as GERD, BMI 37 and chronic constipation after acute issue has been resolved  All questions were answered.      Vista Lawman, MD Gastroenterology  and Hepatology [facility] Gastroenterology   This chart has been completed using [facility] Dictation software, and while attempts have been made to ensure accuracy , certain words and phrases may not  be transcribed as intended

## 2024-01-07 NOTE — Discharge Instructions (Signed)
Gastroenterology recommends not restarting antibiotics.  Take the hydrocodone prepack as directed.  Contact gastroenterology Dr. Tasia Catchings tomorrow and also follow-up with your primary care doctor for the other concerns.  CT scan here today without any significant worsening or evidence of any significant diverticulitis.

## 2024-01-07 NOTE — Patient Instructions (Signed)
It was very nice to meet you today, as dicussed with will plan for the following :  1) ED now for urgent CT abdomen and pelvis  2) Colonoscopy after 6-8 weeks

## 2024-01-08 ENCOUNTER — Telehealth (INDEPENDENT_AMBULATORY_CARE_PROVIDER_SITE_OTHER): Payer: Self-pay | Admitting: Gastroenterology

## 2024-01-08 MED FILL — Hydrocodone-Acetaminophen Tab 5-325 MG: ORAL | Qty: 6 | Status: AC

## 2024-01-08 NOTE — Telephone Encounter (Signed)
Left message for pt to return call to schedule TCS in 8 weeks with Dr.Ahmed.

## 2024-01-12 MED ORDER — PEG 3350-KCL-NA BICARB-NACL 420 G PO SOLR: 4000.0000 mL | Freq: Once | ORAL | 0 refills | Status: AC

## 2024-01-12 NOTE — Telephone Encounter (Signed)
Pt left voicemail returning call. Returned call to patient. Pt scheduled for 03/04/24. PA had to be submitted via fax per [employer]. Instructions will be mailed once pre op is received. Prep sent to pharmacy.

## 2024-01-12 NOTE — Addendum Note (Signed)
Addended by: Marlowe Shores on: 01/12/2024 09:31 AM   Modules accepted: Orders

## 2024-01-15 DIAGNOSIS — I10 Essential (primary) hypertension: Secondary | ICD-10-CM | POA: Diagnosis not present

## 2024-01-16 ENCOUNTER — Encounter: Payer: Self-pay | Admitting: Internal Medicine

## 2024-01-16 LAB — BASIC METABOLIC PANEL
BUN/Creatinine Ratio: 14 (ref 9–23)
BUN: 11 mg/dL (ref 6–24)
CO2: 19 mmol/L — ABNORMAL LOW (ref 20–29)
Calcium: 9.4 mg/dL (ref 8.7–10.2)
Chloride: 105 mmol/L (ref 96–106)
Creatinine, Ser: 0.77 mg/dL (ref 0.57–1.00)
Glucose: 80 mg/dL (ref 70–99)
Potassium: 3.8 mmol/L (ref 3.5–5.2)
Sodium: 140 mmol/L (ref 134–144)
eGFR: 97 mL/min/{1.73_m2} (ref >59)

## 2024-01-20 NOTE — Telephone Encounter (Signed)
Fax received stating that Authorization pended for OON review

## 2024-01-21 ENCOUNTER — Ambulatory Visit: Payer: Self-pay | Admitting: Internal Medicine

## 2024-01-27 NOTE — Telephone Encounter (Signed)
Contacted [employer] to check on auth. Spoke with Wilhemina Cash (Authorization Dept). Before speaking with Tresa Endo the rep had stated that no clinicals were found so request was cancelled. I fax over form and clinical on 01/12/24. Per Tresa Endo B. The colonoscopy itself does not need PA but she checked to make sure provider and hospital were in network and they are. With provider and facility in network and TCS not needed PA-no Pre cert required.

## 2024-02-03 ENCOUNTER — Other Ambulatory Visit: Payer: Self-pay | Admitting: Internal Medicine

## 2024-02-03 MED ORDER — SEMAGLUTIDE-WEIGHT MANAGEMENT 1.7 MG/0.75ML ~~LOC~~ SOAJ: 1.7000 mg | SUBCUTANEOUS | 2 refills | Status: DC

## 2024-02-03 NOTE — Telephone Encounter (Signed)
 Copied from CRM [id number]. Topic: Clinical - Medication Refill >> Feb 03, 2024  2:12 PM Thomes Dinning wrote: Most Recent Primary Care Visit:  Provider: Anabel Halon  Department: [facility]  Visit Type: OFFICE VISIT  Date: 12/29/2023  Medication: Semaglutide-Weight Management 1.7 MG/0.75ML SOAJ  Has the patient contacted their pharmacy? Yes (Agent: If no, request that the patient contact the pharmacy for the refill. If patient does not wish to contact the pharmacy document the reason why and proceed with request.) (Agent: If yes, when and what did the pharmacy advise?)  Is this the correct pharmacy for this prescription? Yes If no, delete pharmacy and type the correct one.  This is the patient's preferred pharmacy:  [facility] [place], [place] - [address] [place] [address] Phone: [phone] Fax: [phone]  [facility] [address] [place] - [address] [place] [address] [place] [address] [place] [address] Phone: [phone] Fax: [phone]  [id number] - [place], [place] - [address] AT [facility] [address] [place] [address] Phone: [phone] Fax: [phone]   Has the prescription been filled recently? No  Is the patient out of the medication? Yes  Has the patient been seen for an appointment in the last year OR does the patient have an upcoming appointment? Yes  Can we respond through MyChart? Yes  Agent: Please be advised that Rx refills may take up to 3 business days. We ask that you follow-up with your pharmacy.

## 2024-02-03 NOTE — Telephone Encounter (Signed)
 Last Fill: 12/26/23 24mL/0 RF  Last OV: 12/29/23 Next OV: 02/27/24  Routing to provider for review/authorization.

## 2024-02-10 ENCOUNTER — Telehealth: Payer: Self-pay | Admitting: Internal Medicine

## 2024-02-10 NOTE — Telephone Encounter (Signed)
 Left detailed vm

## 2024-02-10 NOTE — Telephone Encounter (Signed)
 Copied from CRM [id number]. Topic: Clinical - Medication Question >> Feb 10, 2024 11:19 AM Marlow Baars wrote: Reason for CRM: The patient called in stating she stopped taking Semaglutide-Weight Management 1.7 MG/0.75ML SOAJ back in January until she could get her bp stabilized. She states her bp has been great lately and stabilized within the last 2 months and she is wondering if she can now go back on the Wegovy. If so would she simply be staying on the 1.7 or would it be the next dose? Please assist patient further

## 2024-02-12 ENCOUNTER — Ambulatory Visit: Payer: Self-pay | Admitting: Internal Medicine

## 2024-02-12 NOTE — Telephone Encounter (Signed)
 3 attempts to call patient without success. Routing to clinic.   Copied from CRM [id number]. Topic: Clinical - Medication Question >> Feb 12, 2024  2:53 PM Clayton Bibles wrote: Reason for CRM: Shaton wants to lower dose to 1 MG for Semaglutide-Weight Management 1.7 MG/0.75ML SOAJ . 1.7 MG is making her sick and it's too strong. With the 1 MG she felt good. >> Feb 12, 2024  2:56 PM Clayton Bibles wrote: Please call Marshe when completed at [phone]

## 2024-02-12 NOTE — Telephone Encounter (Signed)
 Copied from CRM [id number]. Topic: Clinical - Medication Question >> Feb 12, 2024  2:53 PM Clayton Bibles wrote: Reason for CRM: Destiny Petty wants to lower dose to 1 MG for Semaglutide-Weight Management 1.7 MG/0.75ML SOAJ . 1.7 MG is making her sick and it's too strong. With the 1 MG she felt good. >> Feb 12, 2024  2:56 PM Clayton Bibles wrote: Please call Destiny Petty when completed at [phone]   Additional Notes: This triage RN attempted to contact the patient at the provided contact number, no answer, and unable to leave message. "Call could not be completed as dialed."  Attempt 1/3

## 2024-02-13 NOTE — Telephone Encounter (Signed)
 Attempted to call patient- "call can not be completed"

## 2024-02-17 NOTE — Telephone Encounter (Signed)
 Copied from CRM [id number]. Topic: Clinical - Prescription Issue >> Feb 17, 2024  3:56 PM Shon Hale wrote: Reason for CRM: Patient informed of Dr. Eliane Decree message. Patient cancelled appointment for 3/21 due to work conflict and will call back to reschedule.  Patient stated she will stay on the 1.7mg  for now then if dosage cannot be lowered.

## 2024-02-18 ENCOUNTER — Ambulatory Visit: Payer: Medicaid Other | Attending: Cardiovascular Disease | Admitting: Cardiovascular Disease

## 2024-02-20 ENCOUNTER — Encounter: Payer: Self-pay | Admitting: Cardiovascular Disease

## 2024-02-27 ENCOUNTER — Encounter: Payer: Medicaid Other | Admitting: Internal Medicine

## 2024-02-27 NOTE — Patient Instructions (Signed)
   Your procedure is scheduled on: 03/04/2024  Report to [facility] Main Entrance at 6:00    AM.  Call this number if you have problems the morning of surgery: [phone]   Remember:  Hold Semaglutide injection 7 days prior to procedure              Follow Directions on the letter you received from Your Physician's office regarding the Bowel Prep              No Smoking the day of Procedure :   Take these medicines the morning of surgery with A SIP OF WATER: Amlodipine, zyrtec, Flonase, and pantoprazole  Maxalt, Norco, zofran and/or use inhalers if needed   Do not wear jewelry, make-up or nail polish.    Do not bring valuables to the hospital.  Contacts, dentures or bridgework may not be worn into surgery.  .   Patients discharged the day of surgery will not be allowed to drive home.     Colonoscopy, Adult, Care After This sheet gives you information about how to care for yourself after your procedure. Your health care provider may also give you more specific instructions. If you have problems or questions, contact your health care provider. What can I expect after the procedure? After the procedure, it is common to have: A small amount of blood in your stool for 24 hours after the procedure. Some gas. Mild abdominal cramping or bloating.  Follow these instructions at home: General instructions  For the first 24 hours after the procedure: Do not drive or use machinery. Do not sign important documents. Do not drink alcohol. Do your regular daily activities at a slower pace than normal. Eat soft, easy-to-digest foods. Rest often. Take over-the-counter or prescription medicines only as told by your health care provider. It is up to you to get the results of your procedure. Ask your health care provider, or the department performing the procedure, when your results will be ready. Relieving cramping and bloating Try walking around when you have cramps or feel bloated. Apply  heat to your abdomen as told by your health care provider. Use a heat source that your health care provider recommends, such as a moist heat pack or a heating pad. Place a towel between your skin and the heat source. Leave the heat on for 20-30 minutes. Remove the heat if your skin turns bright red. This is especially important if you are unable to feel pain, heat, or cold. You may have a greater risk of getting burned. Eating and drinking Drink enough fluid to keep your urine clear or pale yellow. Resume your normal diet as instructed by your health care provider. Avoid heavy or fried foods that are hard to digest. Avoid drinking alcohol for as long as instructed by your health care provider. Contact a health care provider if: You have blood in your stool 2-3 days after the procedure. Get help right away if: You have more than a small spotting of blood in your stool. You pass large blood clots in your stool. Your abdomen is swollen. You have nausea or vomiting. You have a fever. You have increasing abdominal pain that is not relieved with medicine. This information is not intended to replace advice given to you by your health care provider. Make sure you discuss any questions you have with your health care provider. Document Released: 07/09/2004 Document Revised: 08/19/2016 Document Reviewed: 02/06/2016 Elsevier Interactive Patient Education  [employer].

## 2024-03-02 ENCOUNTER — Encounter (HOSPITAL_COMMUNITY)
Admission: RE | Admit: 2024-03-02 | Discharge: 2024-03-02 | Disposition: A | Discharge status: Home / Self Care | Payer: Medicaid Other | Source: Ambulatory Visit | Attending: Gastroenterology | Admitting: Gastroenterology

## 2024-03-02 ENCOUNTER — Encounter (HOSPITAL_COMMUNITY): Payer: Self-pay

## 2024-03-02 VITALS — Ht 60.0 in | Wt 188.3 lb

## 2024-03-02 DIAGNOSIS — Z01818 Encounter for other preprocedural examination: Secondary | ICD-10-CM | POA: Diagnosis present

## 2024-03-02 DIAGNOSIS — Z01812 Encounter for preprocedural laboratory examination: Secondary | ICD-10-CM | POA: Diagnosis not present

## 2024-03-02 LAB — POCT PREGNANCY, URINE: Preg Test, Ur: NEGATIVE

## 2024-03-04 ENCOUNTER — Encounter (HOSPITAL_COMMUNITY): Payer: Self-pay | Admitting: Gastroenterology

## 2024-03-04 ENCOUNTER — Ambulatory Visit (HOSPITAL_COMMUNITY)
Admission: RE | Admit: 2024-03-04 | Discharge: 2024-03-04 | Disposition: A | Discharge status: Home / Self Care | Payer: Medicaid Other | Attending: Gastroenterology | Admitting: Gastroenterology

## 2024-03-04 ENCOUNTER — Ambulatory Visit (HOSPITAL_COMMUNITY): Payer: Self-pay | Admitting: Anesthesiology

## 2024-03-04 ENCOUNTER — Other Ambulatory Visit: Payer: Self-pay

## 2024-03-04 ENCOUNTER — Encounter (HOSPITAL_COMMUNITY)
Admission: RE | Disposition: A | Discharge status: Home / Self Care | Payer: Self-pay | Source: Home / Self Care | Attending: Gastroenterology

## 2024-03-04 DIAGNOSIS — Z7951 Long term (current) use of inhaled steroids: Secondary | ICD-10-CM | POA: Insufficient documentation

## 2024-03-04 DIAGNOSIS — K648 Other hemorrhoids: Secondary | ICD-10-CM | POA: Diagnosis not present

## 2024-03-04 DIAGNOSIS — F319 Bipolar disorder, unspecified: Secondary | ICD-10-CM | POA: Diagnosis not present

## 2024-03-04 DIAGNOSIS — K635 Polyp of colon: Secondary | ICD-10-CM

## 2024-03-04 DIAGNOSIS — J45909 Unspecified asthma, uncomplicated: Secondary | ICD-10-CM | POA: Diagnosis not present

## 2024-03-04 DIAGNOSIS — F419 Anxiety disorder, unspecified: Secondary | ICD-10-CM | POA: Diagnosis not present

## 2024-03-04 DIAGNOSIS — K5730 Diverticulosis of large intestine without perforation or abscess without bleeding: Secondary | ICD-10-CM | POA: Insufficient documentation

## 2024-03-04 DIAGNOSIS — R0609 Other forms of dyspnea: Secondary | ICD-10-CM | POA: Insufficient documentation

## 2024-03-04 DIAGNOSIS — K449 Diaphragmatic hernia without obstruction or gangrene: Secondary | ICD-10-CM | POA: Diagnosis not present

## 2024-03-04 DIAGNOSIS — K5732 Diverticulitis of large intestine without perforation or abscess without bleeding: Secondary | ICD-10-CM

## 2024-03-04 DIAGNOSIS — R0601 Orthopnea: Secondary | ICD-10-CM | POA: Diagnosis not present

## 2024-03-04 DIAGNOSIS — D120 Benign neoplasm of cecum: Secondary | ICD-10-CM | POA: Diagnosis not present

## 2024-03-04 DIAGNOSIS — G43909 Migraine, unspecified, not intractable, without status migrainosus: Secondary | ICD-10-CM | POA: Diagnosis not present

## 2024-03-04 DIAGNOSIS — K649 Unspecified hemorrhoids: Secondary | ICD-10-CM | POA: Diagnosis not present

## 2024-03-04 DIAGNOSIS — K6289 Other specified diseases of anus and rectum: Secondary | ICD-10-CM

## 2024-03-04 DIAGNOSIS — F17210 Nicotine dependence, cigarettes, uncomplicated: Secondary | ICD-10-CM

## 2024-03-04 DIAGNOSIS — E669 Obesity, unspecified: Secondary | ICD-10-CM | POA: Diagnosis not present

## 2024-03-04 DIAGNOSIS — K219 Gastro-esophageal reflux disease without esophagitis: Secondary | ICD-10-CM | POA: Insufficient documentation

## 2024-03-04 DIAGNOSIS — Z79899 Other long term (current) drug therapy: Secondary | ICD-10-CM | POA: Insufficient documentation

## 2024-03-04 DIAGNOSIS — Z6836 Body mass index (BMI) 36.0-36.9, adult: Secondary | ICD-10-CM | POA: Diagnosis not present

## 2024-03-04 DIAGNOSIS — I10 Essential (primary) hypertension: Secondary | ICD-10-CM

## 2024-03-04 DIAGNOSIS — K644 Residual hemorrhoidal skin tags: Secondary | ICD-10-CM | POA: Insufficient documentation

## 2024-03-04 DIAGNOSIS — Z8719 Personal history of other diseases of the digestive system: Secondary | ICD-10-CM | POA: Diagnosis present

## 2024-03-04 HISTORY — PX: COLONOSCOPY WITH PROPOFOL: SHX5780

## 2024-03-04 HISTORY — PX: POLYPECTOMY: SHX5525

## 2024-03-04 LAB — HM COLONOSCOPY

## 2024-03-04 SURGERY — COLONOSCOPY WITH PROPOFOL
Anesthesia: General

## 2024-03-04 MED ORDER — LIDOCAINE HCL (PF) 2 % IJ SOLN
INTRAMUSCULAR | Status: DC | PRN
  Administered 2024-03-04: 60 mg via INTRADERMAL

## 2024-03-04 MED ORDER — PROPOFOL 1000 MG/100ML IV EMUL
INTRAVENOUS | Status: AC
  Filled 2024-03-04: qty 100

## 2024-03-04 MED ORDER — LIDOCAINE HCL (PF) 2 % IJ SOLN
INTRAMUSCULAR | Status: AC
  Filled 2024-03-04: qty 5

## 2024-03-04 MED ORDER — PROPOFOL 10 MG/ML IV BOLUS
INTRAVENOUS | Status: DC | PRN
  Administered 2024-03-04: 30 mg via INTRAVENOUS
  Administered 2024-03-04: 70 mg via INTRAVENOUS

## 2024-03-04 MED ORDER — PHENYLEPHRINE 80 MCG/ML (10ML) SYRINGE FOR IV PUSH (FOR BLOOD PRESSURE SUPPORT)
PREFILLED_SYRINGE | INTRAVENOUS | Status: AC
Start: 2024-03-04 — End: ?
  Filled 2024-03-04: qty 10

## 2024-03-04 MED ORDER — LACTATED RINGERS IV SOLN: INTRAVENOUS | Status: DC

## 2024-03-04 MED ORDER — PROPOFOL 500 MG/50ML IV EMUL
INTRAVENOUS | Status: DC | PRN
Start: 2024-03-04 — End: 2024-03-04
  Administered 2024-03-04: 150 ug/kg/min via INTRAVENOUS

## 2024-03-04 NOTE — Op Note (Signed)
 [facility] Patient Name: Destiny Petty Procedure Date: 03/04/2024 7:19 AM MRN: [id number] Date of Birth: 01-Nov-1981 Attending MD: Sanjuan Dame , MD, [id number] CSN: [id number] Age: 43 Admit Type: Outpatient Procedure:                Colonoscopy Indications:              Follow-up of diverticulitis Providers:                Sanjuan Dame, MD, Crystal Page, Elinor Parkinson Referring MD:              Medicines:                Monitored Anesthesia Care Complications:            No immediate complications. Estimated Blood Loss:     Estimated blood loss: none. Procedure:                Pre-Anesthesia Assessment:                           - Prior to the procedure, a History and Physical                            was performed, and patient medications and                            allergies were reviewed. The patient's tolerance of                            previous anesthesia was also reviewed. The risks                            and benefits of the procedure and the sedation                            options and risks were discussed with the patient.                            All questions were answered, and informed consent                            was obtained. Prior Anticoagulants: The patient has                            taken no anticoagulant or antiplatelet agents. ASA                            Grade Assessment: II - A patient with mild systemic                            disease. After reviewing the risks and benefits,                            the patient was deemed in satisfactory condition to  undergo the procedure.                           After obtaining informed consent, the colonoscope                            was passed under direct vision. Throughout the                            procedure, the patient's blood pressure, pulse, and                            oxygen saturations were monitored continuously. The                             [id number]) scope was introduced through the                            anus and advanced to the the cecum, identified by                            appendiceal orifice and ileocecal valve. The                            colonoscopy was performed without difficulty. The                            patient tolerated the procedure well. The quality                            of the bowel preparation was evaluated using the                            BBPS [facility] Bowel Preparation Scale) with scores                            of: Right Colon = 3, Transverse Colon = 3 and Left                            Colon = 3 (entire mucosa seen well with no residual                            staining, small fragments of stool or opaque                            liquid). The total BBPS score equals 9. The                            ileocecal valve, appendiceal orifice, and rectum                            were photographed. Scope In: 7:42:34 AM Scope Out: 8:02:15 AM Scope Withdrawal Time: 0 hours 16 minutes 57 seconds  Total  Procedure Duration: 0 hours 19 minutes 41 seconds  Findings:      A 5 mm polyp was found in the cecum. The polyp was sessile. The polyp       was removed with a cold snare. Resection and retrieval were complete.      Scattered medium-mouthed diverticula were found in the left colon.      Non-bleeding external internal hemorrhoids were found during       retroflexion. The hemorrhoids were small.      Anal papilla(e) were hypertrophied. Impression:               - One 5 mm polyp in the cecum, removed with a cold                            snare. Resected and retrieved.                           - Diverticulosis in the left colon.                           - Non-bleeding external internal hemorrhoids.                           - Anal papilla(e) were hypertrophied. Moderate Sedation:      Per Anesthesia Care Recommendation:           - Patient has a contact  number available for                            emergencies. The signs and symptoms of potential                            delayed complications were discussed with the                            patient. Return to normal activities tomorrow.                            Written discharge instructions were provided to the                            patient.                           - High fiber diet.                           - Continue present medications.                           - No ibuprofen, naproxen, or other non-steroidal                            anti-inflammatory drugs.                           - Await pathology results.                           -  Repeat colonoscopy in 7 years for surveillance.                           - Return to GI office as previously scheduled. Procedure Code(s):        --- Professional ---                           [phone], Colonoscopy, flexible; with removal of                            tumor(s), polyp(s), or other lesion(s) by snare                            technique Diagnosis Code(s):        --- Professional ---                           D12.0, Benign neoplasm of cecum                           K64.4, Residual hemorrhoidal skin tags                           K64.8, Other hemorrhoids                           K62.89, Other specified diseases of anus and rectum                           K57.32, Diverticulitis of large intestine without                            perforation or abscess without bleeding                           K57.30, Diverticulosis of large intestine without                            perforation or abscess without bleeding CPT copyright 2022 American Medical Association. All rights reserved. The codes documented in this report are preliminary and upon coder review may  be revised to meet current compliance requirements. Sanjuan Dame, MD Sanjuan Dame, MD 03/04/2024 8:07:51 AM This report has been signed  electronically. Number of Addenda: 0

## 2024-03-04 NOTE — Transfer of Care (Addendum)
 Immediate Anesthesia Transfer of Care Note  Patient: Destiny Petty  Procedure(s) Performed: COLONOSCOPY WITH PROPOFOL POLYPECTOMY  Patient Location: Short Stay  Anesthesia Type:General  Level of Consciousness: drowsy and patient cooperative  Airway & Oxygen Therapy: Patient Spontanous Breathing and Patient connected to nasal cannula oxygen  Post-op Assessment: Report given to RN and Post -op Vital signs reviewed and stable  Post vital signs: Reviewed and stable  Last Vitals:  Vitals Value Taken Time  BP 108/62 03/04/24 0806  Temp 36.7 C 03/04/24 0806  Pulse 92 03/04/24 0806  Resp 14 03/04/24 0806  SpO2      Last Pain:  Vitals:   03/04/24 0806  TempSrc: Oral  PainSc: 0-No pain         Complications: No notable events documented.

## 2024-03-04 NOTE — H&P (Signed)
 Primary Care Physician:  Anabel Halon, MD Primary Gastroenterologist:  Dr. Tasia Catchings  Pre-Procedure History & Physical: HPI:  ARCADIA GORGAS is a 43 y.o. 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 Scheduled for Cholecystectomy   GERD (gastroesophageal reflux disease)    Hemorrhoids 02/10/2015   Hiatal hernia    Hyperlipidemia 04/09/2011   Hypertension    no meds   Injury of digital nerve of right little finger 08/23/2020   Laceration of blood vessel of right little finger 08/23/2020   Laceration of finger of left hand without damage to nail    left small finger   Menorrhagia with regular cycle 04/10/2016   Migraine 01/15/2016   Overview:  Overview:  IMO Load 2016 R1.3   Muscle spasm of back 03/13/2016   Numbness and tingling in left hand 08/19/2016   Obesity    Ovarian cyst 12/18/2016   Perianal wart 02/10/2015   Stress 02/14/2016   Weight loss counseling, encounter for  03/13/2016    Past Surgical History:  Procedure Laterality Date   ABLATION     CESAREAN SECTION     CESAREAN SECTION N/A    Phreesia 05/23/2020   CHOLECYSTECTOMY     COLONOSCOPY     DILATATION AND CURETTAGE/HYSTEROSCOPY WITH MINERVA N/A 08/31/2019   Procedure: DILATATION AND CURETTAGE (no specimen) /HYSTEROSCOPY WITH MINERVA ENDOMETRIAL ABLATION;  Surgeon: Tilda Burrow, MD;  Location: [facility];  Service: Gynecology;  Laterality: N/A;   ESOPHAGOGASTRODUODENOSCOPY ENDOSCOPY     HEMORRHOID SURGERY N/A 05/10/2022   Procedure: HEMORRHOIDECTOMY, EXTENSIVE;  Surgeon: Franky Macho, MD;  Location: [facility];  Service: General;  Laterality: N/A;   TENDON EXPLORATION Left 08/10/2020   Procedure: LEFT SMALL FINER EXPLORATION, REPAIR OF TENDON, ARTERY, NERVE;  Surgeon: Betha Loa, MD;  Location: [facility];  Service: Orthopedics;  Laterality: Left;   TUBAL LIGATION      Prior to Admission medications   Medication Sig Start Date End Date Taking? Authorizing Provider  albuterol (VENTOLIN HFA) 108 (90 Base) MCG/ACT inhaler Inhale 2 puffs into the lungs every 6 (six) hours as needed for wheezing or shortness of breath. 07/16/23  Yes Leath-Warren, Sadie Haber, NP  amLODipine (NORVASC) 10 MG tablet Take 1 tablet (10 mg total) by mouth daily. 12/01/23  Yes Anabel Halon, MD  fluticasone (FLONASE) 50 MCG/ACT nasal spray Place 1 spray into both nostrils 2 (two) times daily. 11/09/23  Yes Particia Nearing, PA-C  losartan (COZAAR) 25 MG tablet Take 1 tablet (25 mg total) by mouth daily. 12/29/23  Yes Anabel Halon, MD  pantoprazole (PROTONIX) 40 MG tablet Take 1 tablet (40 mg total) by mouth daily. 11/20/23  Yes Billie Lade, MD  rizatriptan (MAXALT-MLT) 10 MG disintegrating tablet TAKE 1 TABLET BY MOUTH ONCE AS NEEDED FOR MIGRAINE. MAY REPEAT IN 2 HOURS IF NEEDED. 12/01/23  Yes Anabel Halon, MD  Tiotropium Bromide-Olodaterol 2.5-2.5 MCG/ACT AERS Inhale 2 puffs into the lungs daily.  03/21/23  Yes Del Newman Nip, Tenna Child, FNP  cetirizine (ZYRTEC) 10 MG tablet Take 1 tablet (10 mg total) by mouth daily. 03/21/23   Del Nigel Berthold, FNP  HYDROcodone-acetaminophen (NORCO/VICODIN) 5-325 MG tablet Take 1 tablet by mouth every 6 (six) hours as needed. 01/07/24   Vanetta Mulders, MD  ondansetron (ZOFRAN) 4 MG tablet Take 1 tablet (4 mg total) by mouth every 8 (eight) hours as needed for nausea or vomiting. 11/20/23   Billie Lade, MD  Semaglutide-Weight Management 1.7 MG/0.75ML SOAJ Inject 1.7 mg into the skin once a week. 02/03/24   Anabel Halon, MD    Allergies as of 01/12/2024 - Review Complete 01/07/2024  Allergen Reaction Noted   Bee venom Shortness Of Breath 06/01/2020   Latex Itching 01/21/2016   Lisinopril Cough 07/07/2013    Family History  Problem Relation Age of Onset   Diabetes Mother    Kidney failure Mother    Early death Mother 39       diabetes complications   Cancer Father 52       colon cancer   Depression Sister    Hypertension Sister    Anxiety disorder Sister    Other Sister        back problems   ADD / ADHD Daughter    Other Daughter        behavioral issues   ODD Daughter    Asthma Son    Cancer Maternal Grandmother        breast   Seizures Maternal Grandmother    Lupus Other    Breast cancer Maternal Aunt     Social History   Socioeconomic History   Marital status: Legally Separated    Spouse name: Not on file   Number of children: 2   Years of education: 14   Highest education level: Not on file  Occupational History   Not on file  Tobacco Use   Smoking status: Every Day    Current packs/day: 1.00    Average packs/day: 1 pack/day for 26.0 years (26.0 ttl pk-yrs)    Types: Cigarettes    Passive exposure: Never   Smokeless tobacco: Never   Tobacco comments:    1/4 ppd 05/30/21  Vaping Use   Vaping status: Never Used  Substance and Sexual Activity   Alcohol use: Yes    Comment: occ   Drug use: No   Sexual  activity: Not Currently    Birth control/protection: Surgical    Comment: tubal  Other Topics Concern   Not on file  Social History Narrative   Legally separated right now   2 children: son 41 and daughter 12 lives with her      2 dogs: macho, mary jane   2 birds:    1 cats: boghgie       Enjoys: spending time with kids, outside things      Diet: eat all food groups  Caffeine: coffee and soda daily   Water: 4 cups daily      Wears seat belt   Handsfree for phone in car   Smoke detectors at home   No weapons        Social Drivers of Health   Financial Resource Strain: Low Risk  (06/01/2020)   Overall Financial Resource Strain (CARDIA)    Difficulty of Paying Living Expenses: Not hard at all  Food Insecurity: No Food Insecurity (06/01/2020)   Hunger Vital Sign    Worried About Running Out of Food in the Last Year: Never true    Ran Out of Food in the Last Year: Never true  Transportation Needs: No Transportation Needs (06/01/2020)   PRAPARE - Administrator, Civil Service (Medical): No    Lack of Transportation (Non-Medical): No  Physical Activity: Insufficiently Active (06/01/2020)   Exercise Vital Sign    Days of Exercise per Week: 2 days    Minutes of Exercise per Session: 30 min  Stress: No Stress Concern Present (06/01/2020)   [employer] of Occupational Health - Occupational Stress Questionnaire    Feeling of Stress : Not at all  Social Connections: Moderately Isolated (06/01/2020)   Social Connection and Isolation Panel [NHANES]    Frequency of Communication with Friends and Family: More than three times a week    Frequency of Social Gatherings with Friends and Family: More than three times a week    Attends Religious Services: More than 4 times per year    Active Member of Clubs or Organizations: No    Attends Banker Meetings: Never    Marital Status: Separated  Intimate Partner Violence: Not At Risk (06/01/2020)   Humiliation,  Afraid, Rape, and Kick questionnaire    Fear of Current or Ex-Partner: No    Emotionally Abused: No    Physically Abused: No    Sexually Abused: No    Review of Systems: See HPI, otherwise negative ROS  Physical Exam: Vital signs in last 24 hours: Temp:  [98.2 F (36.8 C)] 98.2 F (36.8 C) (03/27 0655) Pulse Rate:  [83] 83 (03/27 0655) Resp:  [14] 14 (03/27 0655) BP: (126)/(86) 126/86 (03/27 0655) SpO2:  [99 %] 99 % (03/27 0655) Weight:  [85.4 kg] 85.4 kg (03/27 0655)   General:   Alert,  Well-developed, well-nourished, pleasant and cooperative in NAD Head:  Normocephalic and atraumatic. Eyes:  Sclera clear, no icterus.   Conjunctiva pink. Ears:  Normal auditory acuity. Nose:  No deformity, discharge,  or lesions. Msk:  Symmetrical without gross deformities. Normal posture. Extremities:  Without clubbing or edema. Neurologic:  Alert and  oriented x4;  grossly normal neurologically. Skin:  Intact without significant lesions or rashes. Psych:  Alert and cooperative. Normal mood and affect.  Impression/Plan: MURRIEL HOLWERDA is a 43 y.o. female with asthma GERD, migraine, obesity who presents for colonoscopy for follow up on complicated diverticulitis   The risks of the procedure including infection, bleed, or perforation as well as benefits, limitations, alternatives and imponderables have been reviewed with the patient. Questions have been answered. All parties agreeable.

## 2024-03-04 NOTE — Anesthesia Preprocedure Evaluation (Signed)
 Anesthesia Evaluation  Patient identified by MRN, date of birth, ID band Patient awake    Reviewed: Allergy & Precautions, H&P , NPO status , Patient's Chart, lab work & pertinent test results, reviewed documented beta blocker date and time   History of Anesthesia Complications (+) Family history of anesthesia reaction  Airway Mallampati: II  TM Distance: >3 FB Neck ROM: full    Dental no notable dental hx.    Pulmonary asthma , Current Smoker and Patient abstained from smoking.   Pulmonary exam normal breath sounds clear to auscultation       Cardiovascular Exercise Tolerance: Good hypertension, + Orthopnea and + DOE   Rhythm:regular Rate:Normal     Neuro/Psych  Headaches PSYCHIATRIC DISORDERS Anxiety Depression Bipolar Disorder    Neuromuscular disease    GI/Hepatic Neg liver ROS, hiatal hernia,GERD  ,,  Endo/Other  negative endocrine ROS    Renal/GU negative Renal ROS  negative genitourinary   Musculoskeletal   Abdominal   Peds  Hematology negative hematology ROS (+)   Anesthesia Other Findings   Reproductive/Obstetrics negative OB ROS                             Anesthesia Physical Anesthesia Plan  ASA: 3  Anesthesia Plan: General   Post-op Pain Management:    Induction:   PONV Risk Score and Plan: Propofol infusion  Airway Management Planned:   Additional Equipment:   Intra-op Plan:   Post-operative Plan:   Informed Consent: I have reviewed the patients History and Physical, chart, labs and discussed the procedure including the risks, benefits and alternatives for the proposed anesthesia with the patient or authorized representative who has indicated his/her understanding and acceptance.     Dental Advisory Given  Plan Discussed with: CRNA  Anesthesia Plan Comments:        Anesthesia Quick Evaluation

## 2024-03-04 NOTE — Discharge Instructions (Signed)

## 2024-03-05 ENCOUNTER — Encounter (HOSPITAL_COMMUNITY): Payer: Self-pay | Admitting: Gastroenterology

## 2024-03-05 ENCOUNTER — Encounter (INDEPENDENT_AMBULATORY_CARE_PROVIDER_SITE_OTHER): Payer: Self-pay | Admitting: *Deleted

## 2024-03-05 LAB — SURGICAL PATHOLOGY

## 2024-03-05 NOTE — Anesthesia Postprocedure Evaluation (Signed)
 Anesthesia Post Note  Patient: Destiny Petty  Procedure(s) Performed: COLONOSCOPY WITH PROPOFOL POLYPECTOMY  Patient location during evaluation: Phase II Anesthesia Type: General Level of consciousness: awake Pain management: pain level controlled Vital Signs Assessment: post-procedure vital signs reviewed and stable Respiratory status: spontaneous breathing and respiratory function stable Cardiovascular status: blood pressure returned to baseline and stable Postop Assessment: no headache and no apparent nausea or vomiting Anesthetic complications: no Comments: Late entry   No notable events documented.   Last Vitals:  Vitals:   03/04/24 0655 03/04/24 0806  BP: 126/86 108/62  Pulse: 83 92  Resp: 14 14  Temp: 36.8 C 36.7 C  SpO2: 99%     Last Pain:  Vitals:   03/04/24 0806  TempSrc: Oral  PainSc: 0-No pain                 Windell Norfolk

## 2024-03-08 ENCOUNTER — Other Ambulatory Visit: Payer: Self-pay | Admitting: Internal Medicine

## 2024-03-08 NOTE — Telephone Encounter (Signed)
 Copied from CRM [id number]. Topic: Clinical - Medication Refill >> Mar 08, 2024  3:12 PM Carlatta H wrote: Most Recent Primary Care Visit:  Provider: Anabel Halon  Department: [facility]  Visit Type: OFFICE VISIT  Date: 12/29/2023  Medication: fluticasone (FLONASE) 50 MCG/ACT nasal spray [[id number]]  Has the patient contacted their pharmacy? Yes (Agent: If no, request that the patient contact the pharmacy for the refill. If patient does not wish to contact the pharmacy document the reason why and proceed with request.) (Agent: If yes, when and what did the pharmacy advise?)  Is this the correct pharmacy for this prescription? Yes If no, delete pharmacy and type the correct one.  This is the patient's preferred pharmacy:  [facility] - [place], [place] - [address] [place] [address] Phone: [phone] Fax: [phone]     Has the prescription been filled recently? No  Is the patient out of the medication? Yes  Has the patient been seen for an appointment in the last year OR does the patient have an upcoming appointment? No  Can we respond through MyChart? Yes  Agent: Please be advised that Rx refills may take up to 3 business days. We ask that you follow-up with your pharmacy.

## 2024-03-08 NOTE — Progress Notes (Signed)
 I reviewed the pathology results. [name], can you send her a letter with the findings as described below please?  Repeat colonoscopy in 7 years  Thanks,  Vista Lawman, MD Gastroenterology and Hepatology [facility]  ---------------------------------------------------------------------------------------------  [facility] [address], [address], [place], [place] [address] Phone:  [phone]   03/08/24 Destiny Petty, [place]   Dear [name],  I am writing to inform you that the biopsies taken during your recent endoscopic examination showed:  FINAL MICROSCOPIC DIAGNOSIS:   A. COLON, CECUM, POLYPECTOMY:       Polypoid colonic mucosa without significant diagnostic alteration.       Negative for dysplasia.    What does this mean?  There is nothing concerning , polyp removal rather showed normal colon mucosa  Given these findings, it is recommended that your next colonoscopy be performed in 7 years.  Also I value your feedback , so if you get a survey , please take the time to fill it out and thank you for choosing [facility]  Please call us at [phone] if you have persistent problems or have questions about your condition that have not been fully answered at this time.  Sincerely,  Vista Lawman, MD Gastroenterology and Hepatology

## 2024-03-09 MED ORDER — FLUTICASONE PROPIONATE 50 MCG/ACT NA SUSP: 1.0000 | Freq: Two times a day (BID) | NASAL | 2 refills | Status: DC

## 2024-03-15 ENCOUNTER — Encounter (INDEPENDENT_AMBULATORY_CARE_PROVIDER_SITE_OTHER): Payer: Self-pay | Admitting: *Deleted

## 2024-03-25 ENCOUNTER — Other Ambulatory Visit: Payer: Self-pay | Admitting: Internal Medicine

## 2024-03-25 DIAGNOSIS — I10 Essential (primary) hypertension: Secondary | ICD-10-CM

## 2024-03-31 ENCOUNTER — Telehealth: Payer: Self-pay

## 2024-03-31 DIAGNOSIS — J4530 Mild persistent asthma, uncomplicated: Secondary | ICD-10-CM

## 2024-03-31 DIAGNOSIS — K5732 Diverticulitis of large intestine without perforation or abscess without bleeding: Secondary | ICD-10-CM

## 2024-03-31 DIAGNOSIS — I10 Essential (primary) hypertension: Secondary | ICD-10-CM

## 2024-03-31 NOTE — Patient Outreach (Signed)
 Telephone Encounter placed by manager, Pryor Browning for the patient due to High risk MM criteria with [employer]. Referral placed for engagement in the CCM team with a [facility] RNCM for management of Chronic Conditions.   Pryor Browning RN, MSN, CCM Manager [facility], [place]  Direct Number: [phone]

## 2024-04-07 ENCOUNTER — Telehealth: Payer: Self-pay | Admitting: *Deleted

## 2024-04-07 NOTE — Progress Notes (Signed)
 Complex Care Management Note  Care Guide Note 04/07/2024 Name: Destiny Petty MRN: [id number] DOB: Oct 04, 1981  Destiny Petty is a 43 y.o. year old female who sees Meldon Sport, MD for primary care. I reached out to Destiny Petty by phone today to offer complex care management services.  Ms. Capeles was given information about Complex Care Management services today including:   The Complex Care Management services include support from the care team which includes your Nurse Care Manager, Clinical Social Worker, or Pharmacist.  The Complex Care Management team is here to help remove barriers to the health concerns and goals most important to you. Complex Care Management services are voluntary, and the patient may decline or stop services at any time by request to their care team member.   Complex Care Management Consent Status: Patient agreed to services and verbal consent obtained.   Follow up plan:  Telephone appointment with complex care management team member scheduled for:  04/20/2024  Encounter Outcome:  Patient Scheduled  Kandis Ormond, CMA, [place]  [facility], [facility] Guide Direct Dial: [phone]  Fax: [phone] Website: [email or web address]

## 2024-04-20 ENCOUNTER — Other Ambulatory Visit: Payer: Self-pay | Admitting: *Deleted

## 2024-04-20 ENCOUNTER — Other Ambulatory Visit: Payer: Self-pay

## 2024-04-20 ENCOUNTER — Encounter: Payer: Self-pay | Admitting: *Deleted

## 2024-04-20 DIAGNOSIS — Z5986 Financial insecurity: Secondary | ICD-10-CM

## 2024-04-20 NOTE — Patient Outreach (Signed)
 Complex Care Management   Visit Note  04/20/2024  Name:  Destiny Petty MRN: [id number] DOB: Feb 01, 1981  Situation: Referral received for Complex Care Management related to HTN I obtained verbal consent from Patient.  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 Scheduled for Cholecystectomy   GERD (gastroesophageal reflux disease)    Hemorrhoids 02/10/2015   Hiatal hernia    Hyperlipidemia 04/09/2011   Hypertension    no meds   Injury of digital nerve of right little finger 08/23/2020   Laceration of blood vessel of right little finger 08/23/2020   Laceration of finger of left hand without damage to nail    left small finger   Menorrhagia with regular cycle 04/10/2016   Migraine 01/15/2016   Overview:  Overview:  IMO Load 2016 R1.3   Muscle spasm of back 03/13/2016   Numbness and tingling in left hand 08/19/2016   Obesity    Ovarian cyst 12/18/2016   Perianal wart 02/10/2015   Stress 02/14/2016   Weight loss counseling, encounter for 03/13/2016    Assessment: Patient Reported Symptoms:  Cognitive Cognitive Status: Alert and oriented to person, place, and time, Able to follow simple commands, Insightful and able to interpret abstract concepts, Normal speech and language skills Cognitive/Intellectual Conditions Management [RPT]:  None reported or documented in medical history or problem list   Health Maintenance Behaviors: Annual physical exam Healing Pattern: Average Health Facilitated by: Rest  Neurological Neurological Review of Symptoms: No symptoms reported    HEENT HEENT Symptoms Reported: No symptoms reported      Cardiovascular Cardiovascular Symptoms Reported: No symptoms reported Does patient have uncontrolled Hypertension?: Yes Is patient checking Blood Pressure at home?: No Cardiovascular Conditions: Hypertension Cardiovascular Management Strategies: Medication therapy, Routine screening, Diet modification Cardiovascular Self-Management Outcome: 3 (uncertain)  Respiratory Respiratory Symptoms Reported: No symptoms reported Respiratory Conditions: Seasonal allergies, Asthma  Endocrine Patient reports the following symptoms related to hypoglycemia or hyperglycemia : No symptoms reported Is patient diabetic?: No Endocrine Self-Management Outcome: 4 (good)  Gastrointestinal Gastrointestinal Symptoms Reported: Constipation, Abdominal pain or discomfort Gastrointestinal Conditions: Abdominal pain, Constipation, Distension Gastrointestinal Management Strategies: Medication therapy Gastrointestinal Self-Management Outcome: 3 (uncertain) Nutrition Risk Screen (CP): No indicators present  Genitourinary Genitourinary Symptoms Reported: No symptoms reported    Integumentary Integumentary Symptoms Reported: No symptoms reported    Musculoskeletal Musculoskelatal Symptoms Reviewed: No symptoms reported   Falls in the past year?: No Number of falls in past year: 1 or less Was there an injury with Fall?: No Fall Risk Category Calculator: 0 Patient Fall Risk Level: Low Fall Risk Patient at Risk for Falls Due to: No Fall Risks Fall risk Follow up: Falls evaluation completed  Psychosocial Psychosocial Symptoms Reported: No symptoms reported   Techniques to Cope with Loss/Stress/Change: Diversional  activities  Quality of Family Relationships: helpful, involved, supportive Do you feel physically threatened by others?: No      04/20/2024    2:40 PM  Depression screen PHQ 2/9  Decreased Interest 0  Down, Depressed, Hopeless 0  PHQ - 2 Score 0    There were no vitals filed for this visit.  Medications Reviewed Today     Reviewed by Remona Carmel, RN (Registered Nurse) on 04/20/24 at 1437  Med List Status: <None>   Medication Order Taking? Sig Documenting Provider Last Dose Status Informant  albuterol  (VENTOLIN  HFA) 108 (90 Base) MCG/ACT inhaler [id number] No Inhale 2 puffs into the lungs every 6 (six) hours as needed for wheezing or shortness of breath.  Patient not taking: Reported on 04/20/2024   Leath-WarrenBelen Bowers, NP Not Taking Active   amLODipine  (NORVASC ) 10 MG tablet [id number] Yes Take 1 tablet (10 mg total) by mouth daily. Meldon Sport, MD Taking Active   cetirizine  (ZYRTEC ) 10 MG tablet [id number] Yes Take 1 tablet (10 mg total) by mouth daily. Del Amber Bail, Rogerio Clay, FNP Taking Active   fluticasone  (FLONASE ) 50 MCG/ACT nasal spray [id number] No Place 1 spray into both nostrils 2 (two) times daily.  Patient not taking: Reported on 04/20/2024   Meldon Sport, MD Not Taking Active   HYDROcodone -acetaminophen  (NORCO/VICODIN) 5-325 MG tablet [id number]  Take 1 tablet by mouth every 6 (six) hours as needed. Zackowski, Scott, MD  Active   losartan  (COZAAR ) 25 MG tablet [id number] No Take 1 tablet (25 mg total) by mouth daily.  Patient not taking: Reported on 04/20/2024   Meldon Sport, MD Not Taking Active   ondansetron  (ZOFRAN ) 4 MG tablet [id number] No Take 1 tablet (4 mg total) by mouth every 8 (eight) hours as needed for nausea or vomiting.  Patient not taking: Reported on 04/20/2024   Tobi Fortes, MD Not Taking Active   pantoprazole  (PROTONIX ) 40 MG tablet [id number] No Take 1 tablet (40 mg total) by mouth daily.  Patient not taking: Reported on 04/20/2024    Tobi Fortes, MD Not Taking Active   rizatriptan  (MAXALT -MLT) 10 MG disintegrating tablet [id number] Yes TAKE 1 TABLET BY MOUTH ONCE AS NEEDED FOR MIGRAINE. MAY REPEAT IN 2 HOURS IF NEEDED. Meldon Sport, MD Taking Active   Semaglutide -Weight Management 1.7 MG/0.75ML SOAJ [id number] Yes Inject 1.7 mg into the skin once a week. Meldon Sport, MD Taking Active   Tiotropium Bromide -Olodaterol 2.5-2.5 MCG/ACT AERS [id number] No Inhale 2 puffs into the lungs daily.  Patient not taking: Reported on 04/20/2024   Rosanna Comment, FNP Not Taking Active             Recommendation:   PCP Follow-up  Follow Up Plan:   Telephone follow up appointment date/time:  04-30-2024 at 11:45 am  [name], BSN RN [facility], [facility] RN Care Manager Direct Dial: [phone]  Fax: [phone]

## 2024-04-20 NOTE — Patient Instructions (Addendum)
 Visit Information  Thank you for taking time to visit with me today. Please don't hesitate to contact me if I can be of assistance to you before our next scheduled appointment.  Our next appointment is by telephone on 04-30-2024 at 11:45 am Please call the care guide team at [phone] if you need to cancel or reschedule your appointment.   Following is a copy of your care plan:   Goals Addressed             This Visit's Progress    VBCI RN Care Plan: HTN   No change    Problems:  Chronic Disease Management support and education needs related to HTN  Goal: Over the next 90 days the Patient will attend all scheduled medical appointments: with primary care provider and specialist as evidenced by keeping all scheduled appointments        continue to work with RN Care Manager and/or Social Worker to address care management and care coordination needs related to HTN as evidenced by adherence to care management team scheduled appointments     take all medications exactly as prescribed and will call provider for medication related questions as evidenced by compliance with all medications    verbalize basic understanding of HTN disease process and self health management plan as evidenced by verbal explanation, recognizing/monitoring symptoms, lifestyle modifications  Interventions:   Hypertension Interventions: Last practice recorded BP readings:  BP Readings from Last 3 Encounters:  03/04/24 108/62  01/07/24 130/88  01/07/24 134/88   Most recent eGFR/CrCl:  Lab Results  Component Value Date   EGFR 97 01/15/2024    No components found for: "CRCL"  Evaluation of current treatment plan related to hypertension self management and patient's adherence to plan as established by provider. Reports that she does not check BP at home. Relates her elevated readings due to abdominal pain from having diverticulitis. She reports being out of her losartan  due to being uninsured until 04-23-24 in  which she will then have commerical [employer] through her employer [employer]). RNCM phoned [address] and spoke with [place]. She stated she will be able to loan the patient a few pills from her prescription if patient brings in her old bottle and she will then run the patients insurance on Friday to complete the fill; patient notified and stated she will go by no later than tomorrow 04-21-2024 to pick up. RNCM also placed pharmacy referral for assistance with medication obtainment Provided education to patient re: stroke prevention, s/s of heart attack and stroke. Education and support provided Reviewed medications with patient and discussed importance of compliance. Reports compliance with the medications that she has available. Will be picking her up losartan  no later than 04-21-24. Counseled on adverse effects of illicit drug and excessive alcohol use in patients with high blood pressure. Denies illicit drug and excessive alcohol use. Counseled on the importance of exercise goals with target of 150 minutes per week. Discussed plans with patient for ongoing care management follow up and provided patient with direct contact information for care management team Advised patient, providing education and rationale, to monitor blood pressure daily and record, calling PCP for findings outside established parameters.  Reviewed scheduled/upcoming provider appointments including: No follow ups scheduled Advised patient to discuss acute changes/elevated home BP readings with provider Provided education on prescribed diet DASH Discussed complications of poorly controlled blood pressure such as heart disease, stroke, circulatory complications, vision complications, kidney impairment, sexual dysfunction Screening for signs and symptoms of depression  related to chronic disease state  Assessed social determinant of health barriers  Patient Self-Care Activities:  Attend all scheduled provider  appointments Call pharmacy for medication refills 3-7 days in advance of running out of medications Call provider office for new concerns or questions  Perform all self care activities independently  Take medications as prescribed   Work with the pharmacist to address medication management needs and will continue to work with the clinical team to address health care and disease management related needs check blood pressure daily write blood pressure results in a log or diary take blood pressure log to all doctor appointments call doctor for signs and symptoms of high blood pressure keep all doctor appointments take medications for blood pressure exactly as prescribed begin an exercise program report new symptoms to your doctor eat more whole grains, fruits and vegetables, lean meats and healthy fats  Plan:  Telephone follow up appointment with care management team member scheduled for:  04-30-2024 at 11:45 am             Please call the Suicide and Crisis Lifeline: 988 call the [place]  National Suicide Prevention Lifeline: 317-682-8818 or TTY: 9567097828 TTY 403 426 3097) to talk to a trained counselor call 1-800-273-TALK (toll free, 24 hour hotline) call the [facility]: 630-249-4424 call 911 if you are experiencing a Mental Health or Behavioral Health Crisis or need someone to talk to.  The patient verbalized understanding of instructions, educational materials, and care plan provided today and agreed to receive a mailed copy of patient instructions, educational materials, and care plan.   [name], BSN RN [facility], [facility] RN Care Manager Direct Dial: [phone]  Fax: [phone]   Diverticulosis  Diverticulosis is when small pouches called diverticula form in the wall of the colon. The colon is where water  is absorbed. It is also where poop (stool) is formed. The pouches form when the inside layer of  the colon pushes through weak spots in the outer layers of the colon. You may have a few pouches or many of them. In most cases, the pouches do not cause problems. If they become inflamed or infected, you may have a condition called diverticulitis. What are the causes? The cause of this condition is not known. What increases the risk? You are more likely to get this condition if: You are older than 45 years of age. You do not eat enough fiber or you get constipated a lot. You are overweight. You do not get enough exercise. You smoke. You take over-the-counter pain medicines. You have a family history of the condition. What are the signs or symptoms? In most people, there are no symptoms. If you do have symptoms, they may include: Bloating. Stomach cramps. Constipation or diarrhea. Pain in the lower left side of your abdomen. How is this diagnosed? This condition is often diagnosed during an exam for other colon problems. It may be diagnosed when you have: A colonoscopy. This is when a tube with a camera on the end is used to look at your colon. A barium enema. This is an X-ray exam that uses dye to look at your colon. A CT scan. How is this treated? You may not need treatment. Your health care provider will tell you what you can do at home to help prevent problems. You may need treatment if you have symptoms or if you have had diverticulitis before. You may be told to: Eat a high-fiber diet. Take medicine  to relax your colon. Lose weight. Follow these instructions at home: Medicines Take over-the-counter and prescription medicines only as told by your provider. If told, take a fiber supplement or probiotic. Managing constipation Your condition may cause constipation. To prevent or treat constipation, you may need to: Drink enough fluid to keep your pee (urine) pale yellow. Take over-the-counter or prescription medicines. Eat foods that are high in fiber, such as beans, whole  grains, and fresh fruits and vegetables. Limit foods that are high in fat and processed sugars, such as fried or sweet foods. Try not to strain when you poop. Contact a health care provider if: Your symptoms get worse all of a sudden. You have pain in your abdomen that gets worse. You have bloating or stomach cramps. You continue to have frequent constipation. You have a fever or chills. You vomit. Your poop is bloody, black, or tarry. This information is not intended to replace advice given to you by your health care provider. Make sure you discuss any questions you have with your health care provider. Document Revised: 08/22/2022 Document Reviewed: 08/22/2022 Elsevier Patient Education  2024 Elsevier Inc.  DASH Eating Plan DASH stands for Dietary Approaches to Stop Hypertension. The DASH eating plan is a healthy eating plan that has been shown to: Lower high blood pressure (hypertension). Reduce your risk for type 2 diabetes, heart disease, and stroke. Help with weight loss. What are tips for following this plan? Reading food labels Check food labels for the amount of salt (sodium) per serving. Choose foods with less than 5 percent of the Daily Value (DV) of sodium. In general, foods with less than 300 milligrams (mg) of sodium per serving fit into this eating plan. To find whole grains, look for the word "whole" as the first word in the ingredient list. Shopping Buy products labeled as "low-sodium" or "no salt added." Buy fresh foods. Avoid canned foods and pre-made or frozen meals. Cooking Try not to add salt when you cook. Use salt-free seasonings or herbs instead of table salt or sea salt. Check with your health care provider or pharmacist before using salt substitutes. Do not fry foods. Cook foods in healthy ways, such as baking, boiling, grilling, roasting, or broiling. Cook using oils that are good for your heart. These include olive, canola, avocado, soybean, and sunflower  oil. Meal planning  Eat a balanced diet. This should include: 4 or more servings of fruits and 4 or more servings of vegetables each day. Try to fill half of your plate with fruits and vegetables. 6-8 servings of whole grains each day. 6 or less servings of lean meat, poultry, or fish each day. 1 oz is 1 serving. A 3 oz (85 g) serving of meat is about the same size as the palm of your hand. One egg is 1 oz (28 g). 2-3 servings of low-fat dairy each day. One serving is 1 cup (237 mL). 1 serving of nuts, seeds, or beans 5 times each week. 2-3 servings of heart-healthy fats. Healthy fats called omega-3 fatty acids are found in foods such as walnuts, flaxseeds, fortified milks, and eggs. These fats are also found in cold-water  fish, such as sardines, salmon, and mackerel. Limit how much you eat of: Canned or prepackaged foods. Food that is high in trans fat, such as fried foods. Food that is high in saturated fat, such as fatty meat. Desserts and other sweets, sugary drinks, and other foods with added sugar. Full-fat dairy products. Do not salt foods  before eating. Do not eat more than 4 egg yolks a week. Try to eat at least 2 vegetarian meals a week. Eat more home-cooked food and less restaurant, buffet, and fast food. Lifestyle When eating at a restaurant, ask if your food can be made with less salt or no salt. If you drink alcohol: Limit how much you have to: 0-1 drink a day if you are female. 0-2 drinks a day if you are female. Know how much alcohol is in your drink. In the [place] one drink is one 12 oz bottle of beer (355 mL), one 5 oz glass of wine (148 mL), or one 1 oz glass of hard liquor (44 mL). General information Avoid eating more than 2,300 mg of salt a day. If you have hypertension, you may need to reduce your sodium intake to 1,500 mg a day. Work with your provider to stay at a healthy body weight or lose weight. Ask what the best weight range is for you. On most days of the  week, get at least 30 minutes of exercise that causes your heart to beat faster. This may include walking, swimming, or biking. Work with your provider or dietitian to adjust your eating plan to meet your specific calorie needs. What foods should I eat? Fruits All fresh, dried, or frozen fruit. Canned fruits that are in their natural juice and do not have sugar added to them. Vegetables Fresh or frozen vegetables that are raw, steamed, roasted, or grilled. Low-sodium or reduced-sodium tomato and vegetable juice. Low-sodium or reduced-sodium tomato sauce and tomato paste. Low-sodium or reduced-sodium canned vegetables. Grains Whole-grain or whole-wheat bread. Whole-grain or whole-wheat pasta. Brown rice. [name]. Bulgur. Whole-grain and low-sodium cereals. Pita bread. Low-fat, low-sodium crackers. Whole-wheat flour tortillas. Meats and other proteins Skinless chicken or [place]. Ground chicken or [place]. Pork with fat trimmed off. Fish and seafood. Egg whites. Dried beans, peas, or lentils. Unsalted nuts, nut butters, and seeds. Unsalted canned beans. Lean cuts of beef with fat trimmed off. Low-sodium, lean precooked or cured meat, such as sausages or meat loaves. Dairy Low-fat (1%) or fat-free (skim) milk. Reduced-fat, low-fat, or fat-free cheeses. Nonfat, low-sodium ricotta or cottage cheese. Low-fat or nonfat yogurt. Low-fat, low-sodium cheese. Fats and oils Soft margarine without trans fats. Vegetable oil. Reduced-fat, low-fat, or light mayonnaise and salad dressings (reduced-sodium). Canola, safflower, olive, avocado, soybean, and sunflower oils. Avocado. Seasonings and condiments Herbs. Spices. Seasoning mixes without salt. Other foods Unsalted popcorn and pretzels. Fat-free sweets. The items listed above may not be all the foods and drinks you can have. Talk to a dietitian to learn more. What foods should I avoid? Fruits Canned fruit in a light or heavy syrup. Fried fruit. Fruit in  cream or butter sauce. Vegetables Creamed or fried vegetables. Vegetables in a cheese sauce. Regular canned vegetables that are not marked as low-sodium or reduced-sodium. Regular canned tomato sauce and paste that are not marked as low-sodium or reduced-sodium. Regular tomato and vegetable juices that are not marked as low-sodium or reduced-sodium. Vanessa General. Olives. Grains Baked goods made with fat, such as croissants, muffins, or some breads. Dry pasta or rice meal packs. Meats and other proteins Fatty cuts of meat. Ribs. Fried meat. Helene Loader. Bologna, salami, and other precooked or cured meats, such as sausages or meat loaves, that are not lean and low in sodium. Fat from the back of a pig (fatback). Bratwurst. Salted nuts and seeds. Canned beans with added salt. Canned or smoked fish. Whole eggs or  egg yolks. Chicken or [place] with skin. Dairy Whole or 2% milk, cream, and half-and-half. Whole or full-fat cream cheese. Whole-fat or sweetened yogurt. Full-fat cheese. Nondairy creamers. Whipped toppings. Processed cheese and cheese spreads. Fats and oils Butter. Stick margarine. Lard. Shortening. Ghee. Bacon fat. Tropical oils, such as coconut, palm kernel, or palm oil. Seasonings and condiments Onion salt, garlic salt, seasoned salt, table salt, and sea salt. Worcestershire sauce. Tartar sauce. Barbecue sauce. Teriyaki sauce. Soy sauce, including reduced-sodium soy sauce. Steak sauce. Canned and packaged gravies. Fish sauce. Oyster sauce. Cocktail sauce. Store-bought horseradish. Ketchup. Mustard. Meat flavorings and tenderizers. Bouillon cubes. Hot sauces. Pre-made or packaged marinades. Pre-made or packaged taco seasonings. Relishes. Regular salad dressings. Other foods Salted popcorn and pretzels. The items listed above may not be all the foods and drinks you should avoid. Talk to a dietitian to learn more. Where to find more information National Heart, Lung, and Blood Institute (NHLBI):  BuffaloDryCleaner.gl American Heart Association (AHA): heart.org Academy of Nutrition and Dietetics: eatright.org National Kidney Foundation (NKF): kidney.org This information is not intended to replace advice given to you by your health care provider. Make sure you discuss any questions you have with your health care provider. Document Revised: 12/12/2022 Document Reviewed: 12/12/2022 Elsevier Patient Education  2024 [employer].

## 2024-04-22 ENCOUNTER — Telehealth: Payer: Self-pay

## 2024-04-22 NOTE — Progress Notes (Unsigned)
 Care Guide Pharmacy Note  04/22/2024 Name: Destiny Petty MRN: [id number] DOB: 04-04-1981  Referred By: Meldon Sport, MD Reason for referral: Complex Care Management (Outreach to schedule with Pharm d )   [name] is a 43 y.o. year old female who is a primary care patient of Meldon Sport, MD.  [name] was referred to the pharmacist for assistance related to: HTN  An unsuccessful telephone outreach was attempted today to contact the patient who was referred to the pharmacy team for assistance with medication assistance. Additional attempts will be made to contact the patient.  [name] , RMA     [facility]  [facility], [facility] Guide  Direct Dial: [phone]  Website: [email or web address]

## 2024-04-23 ENCOUNTER — Other Ambulatory Visit: Payer: Self-pay

## 2024-04-23 NOTE — Progress Notes (Signed)
 Care Guide Pharmacy Note  04/23/2024 Name: Destiny Petty MRN: [id number] DOB: [birth year]  Referred By: Meldon Sport, MD Reason for referral: Complex Care Management (Outreach to schedule with Pharm d )   Destiny Petty is a 43 y.o. year old female who is a primary care patient of Meldon Sport, MD.  [name] was referred to the pharmacist for assistance related to: HTN  Successful contact was made with the patient to discuss pharmacy services including being ready for the pharmacist to call at least 5 minutes before the scheduled appointment time and to have medication bottles and any blood pressure readings ready for review. The patient agreed to meet with the pharmacist via telephone visit on (date/time).04/23/2024  [name] , RMA     [place]  [facility], [facility] Guide  Direct Dial: [phone]  Website: [email or web address]

## 2024-04-23 NOTE — Progress Notes (Signed)
   04/23/2024  Patient ID: Destiny Petty, female   DOB: [birth year], 43 y.o.   MRN: [id number]  Referred by RN to talk with patient regarding BP meds. Pt had run out of her losartan  25mg  then RN helped to coordinate 3 day emergency supply through dispensing pharmacy. Thankfully insurance coverage through new employer is effective as of today 04/23/2024.   Spoke with patient to obtain updated Rx insurance. All information provided to dispensing pharmacy, patient aware of $5.90 copay for her losartan . Offered support as needed in the future. No f/u at this time.   Future Appointments  Date Time Provider Department Center  04/30/2024 11:45 AM Remona Carmel, RN [facility] None   Rolando Cliche, PharmD, BCGP Clinical Pharmacist  [phone]

## 2024-04-30 ENCOUNTER — Other Ambulatory Visit: Payer: Self-pay | Admitting: *Deleted

## 2024-04-30 NOTE — Patient Instructions (Signed)
 Visit Information  Thank you for taking time to visit with me today. Please don't hesitate to contact me if I can be of assistance to you before our next scheduled appointment.  Your next care management appointment is by telephone on 05-28-2024 at 11:45 am  Telephone follow-up in 1 month  Please call the care guide team at [phone] if you need to cancel, schedule, or reschedule an appointment.   Please call the Suicide and Crisis Lifeline: 988 call the [place]  National Suicide Prevention Lifeline: 416-714-6343 or TTY: 908-088-7531 TTY 838-146-0878) to talk to a trained counselor call 1-800-273-TALK (toll free, 24 hour hotline) call the [facility]: 405-020-6407 call 911 if you are experiencing a Mental Health or Behavioral Health Crisis or need someone to talk to.  [name], BSN RN [facility], [facility] RN Care Manager Direct Dial: [phone]  Fax: [phone]  DASH Eating Plan DASH stands for Dietary Approaches to Stop Hypertension. The DASH eating plan is a healthy eating plan that has been shown to: Lower high blood pressure (hypertension). Reduce your risk for type 2 diabetes, heart disease, and stroke. Help with weight loss. What are tips for following this plan? Reading food labels Check food labels for the amount of salt (sodium) per serving. Choose foods with less than 5 percent of the Daily Value (DV) of sodium. In general, foods with less than 300 milligrams (mg) of sodium per serving fit into this eating plan. To find whole grains, look for the word "whole" as the first word in the ingredient list. Shopping Buy products labeled as "low-sodium" or "no salt added." Buy fresh foods. Avoid canned foods and pre-made or frozen meals. Cooking Try not to add salt when you cook. Use salt-free seasonings or herbs instead of table salt or sea salt. Check with your health care provider or pharmacist before using  salt substitutes. Do not fry foods. Cook foods in healthy ways, such as baking, boiling, grilling, roasting, or broiling. Cook using oils that are good for your heart. These include olive, canola, avocado, soybean, and sunflower oil. Meal planning  Eat a balanced diet. This should include: 4 or more servings of fruits and 4 or more servings of vegetables each day. Try to fill half of your plate with fruits and vegetables. 6-8 servings of whole grains each day. 6 or less servings of lean meat, poultry, or fish each day. 1 oz is 1 serving. A 3 oz (85 g) serving of meat is about the same size as the palm of your hand. One egg is 1 oz (28 g). 2-3 servings of low-fat dairy each day. One serving is 1 cup (237 mL). 1 serving of nuts, seeds, or beans 5 times each week. 2-3 servings of heart-healthy fats. Healthy fats called omega-3 fatty acids are found in foods such as walnuts, flaxseeds, fortified milks, and eggs. These fats are also found in cold-water  fish, such as sardines, salmon, and mackerel. Limit how much you eat of: Canned or prepackaged foods. Food that is high in trans fat, such as fried foods. Food that is high in saturated fat, such as fatty meat. Desserts and other sweets, sugary drinks, and other foods with added sugar. Full-fat dairy products. Do not salt foods before eating. Do not eat more than 4 egg yolks a week. Try to eat at least 2 vegetarian meals a week. Eat more home-cooked food and less restaurant, buffet, and fast food. Lifestyle When eating at a restaurant, ask  if your food can be made with less salt or no salt. If you drink alcohol: Limit how much you have to: 0-1 drink a day if you are female. 0-2 drinks a day if you are female. Know how much alcohol is in your drink. In the [place] one drink is one 12 oz bottle of beer (355 mL), one 5 oz glass of wine (148 mL), or one 1 oz glass of hard liquor (44 mL). General information Avoid eating more than 2,300 mg of salt a  day. If you have hypertension, you may need to reduce your sodium intake to 1,500 mg a day. Work with your provider to stay at a healthy body weight or lose weight. Ask what the best weight range is for you. On most days of the week, get at least 30 minutes of exercise that causes your heart to beat faster. This may include walking, swimming, or biking. Work with your provider or dietitian to adjust your eating plan to meet your specific calorie needs. What foods should I eat? Fruits All fresh, dried, or frozen fruit. Canned fruits that are in their natural juice and do not have sugar added to them. Vegetables Fresh or frozen vegetables that are raw, steamed, roasted, or grilled. Low-sodium or reduced-sodium tomato and vegetable juice. Low-sodium or reduced-sodium tomato sauce and tomato paste. Low-sodium or reduced-sodium canned vegetables. Grains Whole-grain or whole-wheat bread. Whole-grain or whole-wheat pasta. Brown rice. [name]. Bulgur. Whole-grain and low-sodium cereals. Pita bread. Low-fat, low-sodium crackers. Whole-wheat flour tortillas. Meats and other proteins Skinless chicken or [place]. Ground chicken or [place]. Pork with fat trimmed off. Fish and seafood. Egg whites. Dried beans, peas, or lentils. Unsalted nuts, nut butters, and seeds. Unsalted canned beans. Lean cuts of beef with fat trimmed off. Low-sodium, lean precooked or cured meat, such as sausages or meat loaves. Dairy Low-fat (1%) or fat-free (skim) milk. Reduced-fat, low-fat, or fat-free cheeses. Nonfat, low-sodium ricotta or cottage cheese. Low-fat or nonfat yogurt. Low-fat, low-sodium cheese. Fats and oils Soft margarine without trans fats. Vegetable oil. Reduced-fat, low-fat, or light mayonnaise and salad dressings (reduced-sodium). Canola, safflower, olive, avocado, soybean, and sunflower oils. Avocado. Seasonings and condiments Herbs. Spices. Seasoning mixes without salt. Other foods Unsalted popcorn and  pretzels. Fat-free sweets. The items listed above may not be all the foods and drinks you can have. Talk to a dietitian to learn more. What foods should I avoid? Fruits Canned fruit in a light or heavy syrup. Fried fruit. Fruit in cream or butter sauce. Vegetables Creamed or fried vegetables. Vegetables in a cheese sauce. Regular canned vegetables that are not marked as low-sodium or reduced-sodium. Regular canned tomato sauce and paste that are not marked as low-sodium or reduced-sodium. Regular tomato and vegetable juices that are not marked as low-sodium or reduced-sodium. Vanessa General. Olives. Grains Baked goods made with fat, such as croissants, muffins, or some breads. Dry pasta or rice meal packs. Meats and other proteins Fatty cuts of meat. Ribs. Fried meat. Helene Loader. Bologna, salami, and other precooked or cured meats, such as sausages or meat loaves, that are not lean and low in sodium. Fat from the back of a pig (fatback). Bratwurst. Salted nuts and seeds. Canned beans with added salt. Canned or smoked fish. Whole eggs or egg yolks. Chicken or [place] with skin. Dairy Whole or 2% milk, cream, and half-and-half. Whole or full-fat cream cheese. Whole-fat or sweetened yogurt. Full-fat cheese. Nondairy creamers. Whipped toppings. Processed cheese and cheese spreads. Fats and oils Butter. Stick  margarine. Lard. Shortening. Ghee. Bacon fat. Tropical oils, such as coconut, palm kernel, or palm oil. Seasonings and condiments Onion salt, garlic salt, seasoned salt, table salt, and sea salt. Worcestershire sauce. Tartar sauce. Barbecue sauce. Teriyaki sauce. Soy sauce, including reduced-sodium soy sauce. Steak sauce. Canned and packaged gravies. Fish sauce. Oyster sauce. Cocktail sauce. Store-bought horseradish. Ketchup. Mustard. Meat flavorings and tenderizers. Bouillon cubes. Hot sauces. Pre-made or packaged marinades. Pre-made or packaged taco seasonings. Relishes. Regular salad dressings. Other  foods Salted popcorn and pretzels. The items listed above may not be all the foods and drinks you should avoid. Talk to a dietitian to learn more. Where to find more information National Heart, Lung, and Blood Institute (NHLBI): BuffaloDryCleaner.gl American Heart Association (AHA): heart.org Academy of Nutrition and Dietetics: eatright.org National Kidney Foundation (NKF): kidney.org This information is not intended to replace advice given to you by your health care provider. Make sure you discuss any questions you have with your health care provider. Document Revised: 12/12/2022 Document Reviewed: 12/12/2022 Elsevier Patient Education  2024 [employer].

## 2024-04-30 NOTE — Patient Outreach (Signed)
 Complex Care Management   Visit Note  04/30/2024  Name:  Destiny Petty MRN: [id number] DOB: January 29, 1981  Situation: Referral received for Complex Care Management related to HTN I obtained verbal consent from Patient.  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 Scheduled for Cholecystectomy   GERD (gastroesophageal reflux disease)    Hemorrhoids 02/10/2015   Hiatal hernia    Hyperlipidemia 04/09/2011   Hypertension    no meds   Injury of digital nerve of right little finger 08/23/2020   Laceration of blood vessel of right little finger 08/23/2020   Laceration of finger of left hand without damage to nail    left small finger   Menorrhagia with regular cycle 04/10/2016   Migraine 01/15/2016   Overview:  Overview:  IMO Load 2016 R1.3   Muscle spasm of back 03/13/2016   Numbness and tingling in left hand 08/19/2016   Obesity    Ovarian cyst 12/18/2016   Perianal wart 02/10/2015   Stress 02/14/2016   Weight loss counseling, encounter for 03/13/2016    Assessment: Patient Reported Symptoms:  Cognitive Cognitive Status: Alert and oriented to person, place, and time, Able to follow simple commands, Poor judgment in daily scenarios, Normal speech and language skills Cognitive/Intellectual Conditions Management [RPT]: None reported or  documented in medical history or problem list   Health Maintenance Behaviors: Annual physical exam Healing Pattern: Average  Neurological Neurological Review of Symptoms: No symptoms reported    HEENT HEENT Symptoms Reported: No symptoms reported      Cardiovascular Cardiovascular Symptoms Reported: No symptoms reported Does patient have uncontrolled Hypertension?: Yes Is patient checking Blood Pressure at home?: No Cardiovascular Conditions: Hypertension  Respiratory Respiratory Symptoms Reported: No symptoms reported Respiratory Conditions: Seasonal allergies, Asthma  Endocrine Patient reports the following symptoms related to hypoglycemia or hyperglycemia : No symptoms reported Is patient diabetic?: No    Gastrointestinal Gastrointestinal Symptoms Reported: No symptoms reported   Nutrition Risk Screen (CP): No indicators present  Genitourinary Genitourinary Symptoms Reported: No symptoms reported    Integumentary Integumentary Symptoms Reported: No symptoms reported    Musculoskeletal Musculoskelatal Symptoms Reviewed: No symptoms reported   Falls in the past year?: No Number of falls in past year: 1 or less Was there an injury with Fall?: No Fall Risk Category Calculator: 0 Patient Fall Risk Level: Low Fall Risk Patient at Risk for Falls Due to: No Fall Risks Fall risk Follow up: Falls evaluation completed  Psychosocial Psychosocial Symptoms Reported: No symptoms reported     Quality of Family Relationships: helpful, involved, supportive Do you feel physically threatened by others?: No      04/30/2024   12:06 PM  Depression screen PHQ 2/9  Decreased Interest 0  Down, Depressed, Hopeless 0  PHQ - 2 Score 0  There were no vitals filed for this visit.  Medications Reviewed Today     Reviewed by Remona Carmel, RN (Registered Nurse) on 04/30/24 at 1158  Med List Status: <None>   Medication Order Taking? Sig Documenting Provider Last Dose Status Informant   albuterol  (VENTOLIN  HFA) 108 (90 Base) MCG/ACT inhaler [id number]  Inhale 2 puffs into the lungs every 6 (six) hours as needed for wheezing or shortness of breath.  Patient not taking: Reported on 04/20/2024   Hardy Lia, NP  Active   amLODipine  (NORVASC ) 10 MG tablet [id number] Yes Take 1 tablet (10 mg total) by mouth daily. Meldon Sport, MD Taking Active   cetirizine  (ZYRTEC ) 10 MG tablet [id number] Yes Take 1 tablet (10 mg total) by mouth daily. Del Amber Bail, Rogerio Clay, FNP Taking Active   fluticasone  (FLONASE ) 50 MCG/ACT nasal spray [id number]  Place 1 spray into both nostrils 2 (two) times daily.  Patient not taking: Reported on 04/20/2024   Meldon Sport, MD  Active   HYDROcodone -acetaminophen  (NORCO/VICODIN) 5-325 MG tablet [id number]  Take 1 tablet by mouth every 6 (six) hours as needed. Zackowski, Scott, MD  Active   losartan  (COZAAR ) 25 MG tablet [id number] Yes Take 1 tablet (25 mg total) by mouth daily. Meldon Sport, MD Taking Active   ondansetron  (ZOFRAN ) 4 MG tablet [id number]  Take 1 tablet (4 mg total) by mouth every 8 (eight) hours as needed for nausea or vomiting.  Patient not taking: Reported on 04/20/2024   Tobi Fortes, MD  Active   pantoprazole  (PROTONIX ) 40 MG tablet [id number]  Take 1 tablet (40 mg total) by mouth daily.  Patient not taking: Reported on 04/20/2024   Tobi Fortes, MD  Active   rizatriptan  (MAXALT -MLT) 10 MG disintegrating tablet [id number] Yes TAKE 1 TABLET BY MOUTH ONCE AS NEEDED FOR MIGRAINE. MAY REPEAT IN 2 HOURS IF NEEDED. Meldon Sport, MD Taking Active   Semaglutide -Weight Management 1.7 MG/0.75ML SOAJ [id number] No Inject 1.7 mg into the skin once a week.  Patient not taking: Reported on 04/30/2024   Meldon Sport, MD Not Taking Active   Tiotropium Bromide -Olodaterol 2.5-2.5 MCG/ACT AERS [id number]  Inhale 2 puffs into the lungs daily.  Patient not taking: Reported on 04/20/2024   Rosanna Comment, FNP  Active              Recommendation:   PCP Follow-up  Follow Up Plan:   Referral to Pharmacist  [name], BSN RN [facility]  [facility], [facility] RN Care Manager Direct Dial: [phone]  Fax: [phone]

## 2024-05-04 ENCOUNTER — Telehealth: Payer: Self-pay

## 2024-05-04 NOTE — Progress Notes (Signed)
 Care Guide Pharmacy Note  05/04/2024 Name: Destiny Petty MRN: [id number] DOB: October 17, 1981  Referred By: Meldon Sport, MD Reason for referral: Complex Care Management (Outreach to schedule with Pharm d )   Destiny Petty is a 43 y.o. year old female who is a primary care patient of Meldon Sport, MD.  Destiny Petty was referred to the pharmacist for assistance related to: Weightloss   Successful contact was made with the patient to discuss pharmacy services including being ready for the pharmacist to call at least 5 minutes before the scheduled appointment time and to have medication bottles and any blood pressure readings ready for review. The patient agreed to meet with the pharmacist via telephone visit on (date/time).05/06/2024  [name] , RMA     [place]  [facility], [facility] Guide  Direct Dial: [phone]  Website: [email or web address]

## 2024-05-06 ENCOUNTER — Other Ambulatory Visit: Payer: Self-pay

## 2024-05-06 ENCOUNTER — Other Ambulatory Visit: Payer: Self-pay | Admitting: Internal Medicine

## 2024-05-06 MED ORDER — WEGOVY 1 MG/0.5ML ~~LOC~~ SOAJ: 1.0000 mg | SUBCUTANEOUS | 2 refills | Status: DC

## 2024-05-06 NOTE — Progress Notes (Signed)
   05/06/2024  Patient ID: Destiny Petty, female   DOB: Dec 09, 1981, 43 y.o.   MRN: [id number]  Spoke with patient regarding weight loss medications. Reviewed GLP-1 options, we do not understand insurance coverage at this time. We agreed that next steps, if covered by insurance, would include wegovy  or zepbound. Wishes to not exceed 1.1 mg/wk of zepbound for the time being if she was to be restarted back on that. Notes to have not taken any GLP1 meds past two months.   Provided me with following numbers: pharmacist / provider / customer service [phone] / [phone] / [phone]   - spoke with [facility] insurance representative for >40 min to inquire on below two weight loss medications   Wegovy  - PA required - rep states $30/30 days' supply. $90/90 DS. Will pend order for 1mg  to Dr Lydia Sams, alternatively could consider titration from 0.25mg .  Zepbound - non formulary, not covered.   - spoke with patient post visit to inform her of wegovy  being sent in. Agreeable w/ 1mg  once weekly  Rolando Cliche, PharmD, BCGP Clinical Pharmacist  [phone]

## 2024-05-07 ENCOUNTER — Other Ambulatory Visit (HOSPITAL_COMMUNITY): Payer: Self-pay

## 2024-05-07 ENCOUNTER — Telehealth: Payer: Self-pay | Admitting: Pharmacy Technician

## 2024-05-07 NOTE — Telephone Encounter (Signed)
 Pharmacy Patient Advocate Encounter   Received notification from CoverMyMeds that prior authorization for Wegovy  1MG /0.5ML auto-injectors is required/requested.   Insurance verification completed.   The patient is insured through [facility] .   Per test claim: PA required; PA submitted to above mentioned insurance via CoverMyMeds Key/confirmation #/EOC [id number] Status is pending

## 2024-05-10 NOTE — Telephone Encounter (Signed)
 Pharmacy Patient Advocate Encounter  Received notification from [facility] that Prior Authorization for Wegovy  1MG /0.5ML auto-injectors has been DENIED.  Full denial letter will be uploaded to the media tab. See denial reason below.   PA #/Case ID/Reference #: Key: [id number]

## 2024-05-14 ENCOUNTER — Telehealth: Payer: Self-pay

## 2024-05-14 NOTE — Telephone Encounter (Signed)
 Copied from CRM [id number]. Topic: Clinical - Prescription Issue >> May 13, 2024  5:48 PM Stanly Early wrote: Reason for CRM:  Destiny Petty calling from [facility] stated that, Wegovy  1MG /0.5ML when it comes to her insurance it does not covers weight loss, if theres another medical condition please put that instead of weight loss.

## 2024-05-28 ENCOUNTER — Other Ambulatory Visit: Payer: Self-pay | Admitting: *Deleted

## 2024-05-28 NOTE — Patient Instructions (Signed)
 Visit Information  Thank you for taking time to visit with me today. Please don't hesitate to contact me if I can be of assistance to you before our next scheduled appointment.  Your next care management appointment is by telephone on 06-25-2024 at 11:45 am  Telephone follow-up in 1 month  Please call the care guide team at [phone] if you need to cancel, schedule, or reschedule an appointment.   Please call the Suicide and Crisis Lifeline: 988 call the [place]  National Suicide Prevention Lifeline: 863-445-9923 or TTY: 747-757-0362 TTY 989-052-4108) to talk to a trained counselor call 1-800-273-TALK (toll free, 24 hour hotline) call the [facility]: [phone] call 911 if you are experiencing a Mental Health or Behavioral Health Crisis or need someone to talk to.  [name], BSN RN [facility], [facility] RN Care Manager Direct Dial: [phone]  Fax: [phone]

## 2024-05-28 NOTE — Patient Outreach (Signed)
 Complex Care Management   Visit Note  05/28/2024  Name:  Destiny Petty MRN: [id number] DOB: [birth year]  Situation: Referral received for Complex Care Management related to HTN I obtained verbal consent from Patient.  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 Scheduled for Cholecystectomy   GERD (gastroesophageal reflux disease)    Hemorrhoids 02/10/2015   Hiatal hernia    Hyperlipidemia 04/09/2011   Hypertension    no meds   Injury of digital nerve of right little finger 08/23/2020   Laceration of blood vessel of right little finger 08/23/2020   Laceration of finger of left hand without damage to nail    left small finger   Menorrhagia with regular cycle 04/10/2016   Migraine 01/15/2016   Overview:  Overview:  IMO Load 2016 R1.3   Muscle spasm of back 03/13/2016   Numbness and tingling in left hand 08/19/2016   Obesity    Ovarian cyst 12/18/2016   Perianal wart 02/10/2015   Stress 02/14/2016   Weight loss counseling, encounter for 03/13/2016    Assessment: Patient Reported Symptoms:  Cognitive Cognitive Status: Alert and oriented to person, place, and time, Able to follow simple commands, Insightful and able to interpret abstract concepts, Normal speech and language skills Cognitive/Intellectual Conditions Management [RPT]:  None reported or documented in medical history or problem list   Health Maintenance Behaviors: Annual physical exam Healing Pattern: Average Health Facilitated by: Rest, Stress management  Neurological Neurological Review of Symptoms: No symptoms reported Neurological Conditions: Headache  HEENT HEENT Symptoms Reported: No symptoms reported      Cardiovascular Cardiovascular Symptoms Reported: No symptoms reported Does patient have uncontrolled Hypertension?: Yes Is patient checking Blood Pressure at home?: Yes Patient's Recent BP reading at home: 108/89 Cardiovascular Conditions: Hypertension Cardiovascular Management Strategies: Medication therapy, Routine screening  Respiratory Respiratory Symptoms Reported: No symptoms reported    Endocrine Patient reports the following symptoms related to hypoglycemia or hyperglycemia : No symptoms reported Is patient diabetic?: No    Gastrointestinal Gastrointestinal Symptoms Reported: No symptoms reported   Nutrition Risk Screen (CP): No indicators present  Genitourinary Genitourinary Symptoms Reported: No symptoms reported    Integumentary Integumentary Symptoms Reported: No symptoms reported    Musculoskeletal Musculoskelatal Symptoms Reviewed: No symptoms reported   Falls in the past year?: No Number of falls in past year: 1 or less Was there an injury with Fall?: No Fall Risk Category Calculator: 0 Patient Fall Risk Level: Low Fall Risk Patient at Risk for Falls Due to: No Fall Risks Fall risk Follow up: Falls evaluation completed  Psychosocial Psychosocial Symptoms Reported: Depression - if selected complete PHQ 2-9 Behavioral Health Conditions: Depression, Anxiety Major Change/Loss/Stressor/Fears (CP): Denies Techniques to Cope with Loss/Stress/Change: Diversional activities Quality of Family Relationships: helpful, involved, supportive Do you  feel physically threatened by others?: No      05/28/2024   11:56 AM  Depression  screen PHQ 2/9  Decreased Interest 0  Down, Depressed, Hopeless 3  PHQ - 2 Score 3  Altered sleeping 0  Tired, decreased energy 3  Change in appetite 0  Feeling bad or failure about yourself  0  Trouble concentrating 0  Moving slowly or fidgety/restless 0  Suicidal thoughts 0  PHQ-9 Score 6  Difficult doing work/chores Somewhat difficult    Vitals:   05/28/24 1153  BP: 108/89    Medications Reviewed Today     Reviewed by Remona Carmel, RN (Registered Nurse) on 05/28/24 at 1155  Med List Status: <None>   Medication Order Taking? Sig Documenting Provider Last Dose Status Informant  albuterol  (VENTOLIN  HFA) 108 (90 Base) MCG/ACT inhaler [id number] No Inhale 2 puffs into the lungs every 6 (six) hours as needed for wheezing or shortness of breath.  Patient not taking: Reported on 04/20/2024   Leath-WarrenBelen Bowers, NP Not Taking Active   amLODipine  (NORVASC ) 10 MG tablet [id number] No Take 1 tablet (10 mg total) by mouth daily. Meldon Sport, MD Taking Active   cetirizine  (ZYRTEC ) 10 MG tablet [id number] No Take 1 tablet (10 mg total) by mouth daily. Del Amber Bail, Rogerio Clay, FNP Taking Active   fluticasone  (FLONASE ) 50 MCG/ACT nasal spray [id number] No Place 1 spray into both nostrils 2 (two) times daily.  Patient not taking: Reported on 04/20/2024   Meldon Sport, MD Not Taking Active   HYDROcodone -acetaminophen  (NORCO/VICODIN) 5-325 MG tablet [id number] No Take 1 tablet by mouth every 6 (six) hours as needed. Zackowski, Scott, MD More than a month Active   losartan  (COZAAR ) 25 MG tablet [id number] No Take 1 tablet (25 mg total) by mouth daily. Meldon Sport, MD Taking Active   ondansetron  (ZOFRAN ) 4 MG tablet [id number] No Take 1 tablet (4 mg total) by mouth every 8 (eight) hours as needed for nausea or vomiting.  Patient not taking: Reported on 04/20/2024   Tobi Fortes, MD Not Taking Active   pantoprazole  (PROTONIX ) 40 MG tablet [id number] No Take 1 tablet (40 mg total) by  mouth daily.  Patient not taking: Reported on 04/20/2024   Tobi Fortes, MD Not Taking Active   rizatriptan  (MAXALT -MLT) 10 MG disintegrating tablet [id number] No TAKE 1 TABLET BY MOUTH ONCE AS NEEDED FOR MIGRAINE. MAY REPEAT IN 2 HOURS IF NEEDED. Meldon Sport, MD Taking Active   Semaglutide -Weight Management (WEGOVY ) 1 MG/0.5ML [place] [id number]  Inject 1 mg into the skin every 7 (seven) days. Meldon Sport, MD  Active   Tiotropium Bromide -Olodaterol 2.5-2.5 MCG/ACT AERS [id number] No Inhale 2 puffs into the lungs daily.  Patient not taking: Reported on 04/20/2024   Del Abron Abt, FNP Not Taking Active             Recommendation:   Continue Current Plan of Care  Follow Up Plan:   Telephone follow-up in 1 month  [name], BSN RN [facility], [facility] RN Care Manager Direct Dial: [phone]  Fax: [phone]

## 2024-06-18 ENCOUNTER — Encounter: Payer: Self-pay | Admitting: Internal Medicine

## 2024-06-18 ENCOUNTER — Ambulatory Visit: Admitting: Internal Medicine

## 2024-06-18 VITALS — BP 110/73 | HR 93 | Ht 60.0 in | Wt 197.2 lb

## 2024-06-18 DIAGNOSIS — G43719 Chronic migraine without aura, intractable, without status migrainosus: Secondary | ICD-10-CM

## 2024-06-18 DIAGNOSIS — K219 Gastro-esophageal reflux disease without esophagitis: Secondary | ICD-10-CM

## 2024-06-18 DIAGNOSIS — R739 Hyperglycemia, unspecified: Secondary | ICD-10-CM

## 2024-06-18 DIAGNOSIS — J4530 Mild persistent asthma, uncomplicated: Secondary | ICD-10-CM

## 2024-06-18 DIAGNOSIS — E782 Mixed hyperlipidemia: Secondary | ICD-10-CM

## 2024-06-18 DIAGNOSIS — E559 Vitamin D deficiency, unspecified: Secondary | ICD-10-CM

## 2024-06-18 DIAGNOSIS — I10 Essential (primary) hypertension: Secondary | ICD-10-CM

## 2024-06-18 DIAGNOSIS — J0110 Acute frontal sinusitis, unspecified: Secondary | ICD-10-CM | POA: Insufficient documentation

## 2024-06-18 MED ORDER — AMLODIPINE BESYLATE 10 MG PO TABS: 10.0000 mg | ORAL_TABLET | Freq: Every day | ORAL | 3 refills | Status: AC

## 2024-06-18 MED ORDER — AMOXICILLIN-POT CLAVULANATE 875-125 MG PO TABS
1.0000 | ORAL_TABLET | Freq: Two times a day (BID) | ORAL | 0 refills | Status: DC
Start: 2024-06-18 — End: 2024-06-27

## 2024-06-18 MED ORDER — FLUTICASONE PROPIONATE 50 MCG/ACT NA SUSP: 1.0000 | Freq: Two times a day (BID) | NASAL | 2 refills | Status: DC

## 2024-06-18 MED ORDER — PANTOPRAZOLE SODIUM 40 MG PO TBEC: 40.0000 mg | DELAYED_RELEASE_TABLET | Freq: Every day | ORAL | 5 refills | Status: DC

## 2024-06-18 MED ORDER — RIZATRIPTAN BENZOATE 10 MG PO TBDP
ORAL_TABLET | ORAL | 2 refills | Status: DC
Start: 2024-06-18 — End: 2024-06-30

## 2024-06-18 MED ORDER — LOSARTAN POTASSIUM 25 MG PO TABS: 25.0000 mg | ORAL_TABLET | Freq: Every day | ORAL | 3 refills | Status: DC

## 2024-06-18 MED ORDER — AIRSUPRA 90-80 MCG/ACT IN AERO
2.0000 | INHALATION_SPRAY | Freq: Four times a day (QID) | RESPIRATORY_TRACT | 5 refills | Status: AC | PRN
Start: 2024-06-18 — End: ?

## 2024-06-18 NOTE — Assessment & Plan Note (Signed)
 Well controlled with pantoprazole now Had persistent acid reflux while taking Pepcid

## 2024-06-18 NOTE — Patient Instructions (Signed)
 Please start taking Augmentin  as prescribed for sinusitis.  Please use Flonase  for nasal congestion.  Please use sinus inhaler or vaporizer for nasal congestion.  Please use AirSupra  as needed for shortness of breath or wheezing.  Please continue to take medications as prescribed.  Please continue to follow low carb diet and perform moderate exercise/walking at least 150 mins/week.

## 2024-06-18 NOTE — Assessment & Plan Note (Signed)
 BP Readings from Last 1 Encounters:  06/18/24 110/73   Well-controlled with amlodipine  10 mg QD and Losartan  25 mg QD Counseled for compliance with the medications Advised DASH diet and moderate exercise/walking, at least 150 mins/week

## 2024-06-18 NOTE — Assessment & Plan Note (Addendum)
 Uncontrolled with Stiolto and albuterol  as needed Has required Albuterol  more frequently recently Switched to Airsupra  for better efficacy, coupon provided Has seen pulmonology

## 2024-06-18 NOTE — Assessment & Plan Note (Signed)
Well-controlled with PRN Maxalt, refilled If frequent episodes of migraine, will add topiramate - has tried it before

## 2024-06-18 NOTE — Progress Notes (Signed)
 Established Patient Office Visit  Subjective:  Patient ID: Destiny Petty, female    DOB: 09/16/1981  Age: 43 y.o. MRN: [id number]  CC:  Chief Complaint  Patient presents with   Medical Management of Chronic Issues    Would like medication change for weight loss. Needs notes from PCP, saying htn is a health dx and wegovy  is to help with this.    Sore Throat    Reports sore throat, and having ear fullness, has nose drainage to her throat, started on Monday.     HPI Destiny Petty is a 43 y.o. female with past medical history of asthma, GERD, migraine, HLD, depression and morbid obesity who presents for f/u of her chronic medical conditions.  HTN: Her BP was WNL today. She has been taking amlodipine  10 mg QD and losartan  25 mg QD regularly. She denies any chest pain or palpitations.   Migraine: She complains of intermittent episodes of headache, which is sharp, nonradiating, and is associated with severe nausea.  She has Maxalt  as needed, which improves her headache  Asthma:  She uses Stiolto as maintenance inhaler and Albuterol  PRN for dyspnea or wheezing.  Denies any fever or chills.  She takes Xyzal  for allergies.  Obesity: She had responded well to Wegovy  and had lost about 21 lbs with it. She has lost coverage for Wegovy  now and has gained about 10 lbs weight since then. She is trying to cut down soft drinks and has been following low-carb diet.  She complained of chronic fatigue due to recent weight gain in the past, which was improving.  MDD:  She has stopped taking Wellbutrin , which was started to help her quit smoking.  She still smokes, but reports that she has not been smoking on a daily basis.  Denies any SI or HI currently.  She attributed her mood symptoms to weight gain, but was getting better with weight loss in the past.  She reports nasal congestion, sinus pressure related headache and postnasal drip for the last 1 week.  Denies any fever or chills.  She  has bilateral ear fullness as well.   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 Scheduled for Cholecystectomy   GERD (gastroesophageal reflux disease)    Hemorrhoids 02/10/2015   Hiatal hernia    Hyperlipidemia 04/09/2011   Hypertension    no meds   Injury of digital nerve of right little finger 08/23/2020   Laceration of blood vessel of right little finger 08/23/2020   Laceration of finger of left hand without damage to nail    left small finger   Menorrhagia with regular cycle 04/10/2016   Migraine 01/15/2016   Overview:  Overview:  IMO Load 2016 R1.3   Muscle spasm of back 03/13/2016   Numbness and tingling in left hand 08/19/2016   Obesity  Ovarian cyst 12/18/2016   Perianal wart 02/10/2015   Stress 02/14/2016   Weight loss counseling, encounter for 03/13/2016    Past Surgical History:  Procedure Laterality Date   ABLATION     CESAREAN SECTION     CESAREAN SECTION N/A    Phreesia 05/23/2020   CHOLECYSTECTOMY     COLONOSCOPY     COLONOSCOPY WITH PROPOFOL  N/A 03/04/2024   Procedure: COLONOSCOPY WITH PROPOFOL ;  Surgeon: Cinderella Deatrice FALCON, MD;  [facility];  Service: Endoscopy;  Laterality: N/A;  7:30AM;ASA 3   DILATATION AND CURETTAGE/HYSTEROSCOPY WITH MINERVA N/A 08/31/2019   Procedure: DILATATION AND CURETTAGE (no specimen) /HYSTEROSCOPY WITH MINERVA ENDOMETRIAL ABLATION;   Surgeon: Edsel Norleen GAILS, MD;  Location: [facility];  Service: Gynecology;  Laterality: N/A;   ESOPHAGOGASTRODUODENOSCOPY ENDOSCOPY     HEMORRHOID SURGERY N/A 05/10/2022   Procedure: HEMORRHOIDECTOMY, EXTENSIVE;  Surgeon: Mavis Anes, MD;  Location: [facility];  Service: General;  Laterality: N/A;   POLYPECTOMY  03/04/2024   Procedure: POLYPECTOMY;  Surgeon: Cinderella Deatrice FALCON, MD;  Location: [facility];  Service: Endoscopy;;   TENDON EXPLORATION Left 08/10/2020   Procedure: LEFT SMALL FINER EXPLORATION, REPAIR OF TENDON, ARTERY, NERVE;  Surgeon: Murrell Drivers, MD;  Location: [facility];  Service: Orthopedics;  Laterality: Left;   TUBAL LIGATION      Family History  Problem Relation Age of Onset   Diabetes Mother    Kidney failure Mother    Early death Mother 27       diabetes complications   Cancer Father 103       colon cancer   Depression Sister    Hypertension Sister    Anxiety disorder Sister    Other Sister        back problems   ADD / ADHD Daughter    Other Daughter        behavioral issues   ODD Daughter    Asthma Son    Cancer Maternal Grandmother        breast   Seizures Maternal Grandmother    Lupus Other    Breast cancer Maternal Aunt     Social History   Socioeconomic History   Marital status: Legally Separated    Spouse name: Not on file   Number of children: 2   Years of education: 14   Highest education level: Not on file  Occupational History   Not on file  Tobacco Use   Smoking status: Every Day    Current packs/day: 1.00    Average packs/day: 1 pack/day for 26.0 years (26.0 ttl pk-yrs)    Types: Cigarettes    Passive exposure: Never   Smokeless tobacco: Never   Tobacco comments:    1/4 ppd 05/30/21  Vaping Use   Vaping status: Never Used  Substance and Sexual Activity   Alcohol use: Yes    Comment: occ   Drug use: No   Sexual activity: Not Currently    Birth control/protection: Surgical    Comment: tubal  Other Topics Concern    Not on file  Social History Narrative   Legally separated right now   2 children: son 41 and daughter 32 lives with her      2 dogs: macho, mary jane   2 birds:    1 cats: boghgie       Enjoys: spending time with kids, outside things      Diet: eat all food groups    Caffeine: coffee and  soda daily   Water : 4 cups daily      Wears seat belt   Handsfree for phone in car   Smoke detectors at home   No weapons        Social Drivers of Health   Financial Resource Strain: High Risk (04/20/2024)   Overall Financial Resource Strain (CARDIA)    Difficulty of Paying Living Expenses: Very hard  Food Insecurity: No Food Insecurity (05/28/2024)   Hunger Vital Sign    Worried About Running Out of Food in the Last Year: Never true    Ran Out of Food in the Last Year: Never true  Transportation Needs: No Transportation Needs (05/28/2024)   PRAPARE - Administrator, Civil Service (Medical): No    Lack of Transportation (Non-Medical): No  Physical Activity: Insufficiently Active (04/20/2024)   Exercise Vital Sign    Days of Exercise per Week: 4 days    Minutes of Exercise per Session: 30 min  Stress: Stress Concern Present (04/20/2024)   [employer] of Occupational Health - Occupational Stress Questionnaire    Feeling of Stress : Very much  Social Connections: Moderately Isolated (04/20/2024)   Social Connection and Isolation Panel    Frequency of Communication with Friends and Family: More than three times a week    Frequency of Social Gatherings with Friends and Family: More than three times a week    Attends Religious Services: More than 4 times per year    Active Member of [employer] or Organizations: No    Attends Banker Meetings: Never    Marital Status: Separated  Intimate Partner Violence: Not At Risk (05/28/2024)   Humiliation, Afraid, Rape, and Kick questionnaire    Fear of Current or Ex-Partner: No    Emotionally Abused: No    Physically Abused: No     Sexually Abused: No    Outpatient Medications Prior to Visit  Medication Sig Dispense Refill   cetirizine  (ZYRTEC ) 10 MG tablet Take 1 tablet (10 mg total) by mouth daily. 30 tablet 0   HYDROcodone -acetaminophen  (NORCO/VICODIN) 5-325 MG tablet Take 1 tablet by mouth every 6 (six) hours as needed. 6 tablet 0   ondansetron  (ZOFRAN ) 4 MG tablet Take 1 tablet (4 mg total) by mouth every 8 (eight) hours as needed for nausea or vomiting. 20 tablet 0   Semaglutide -Weight Management (WEGOVY ) 1 MG/0.5ML SOAJ Inject 1 mg into the skin every 7 (seven) days. 2 mL 2   Tiotropium Bromide -Olodaterol 2.5-2.5 MCG/ACT AERS Inhale 2 puffs into the lungs daily. 4 g 5   albuterol  (VENTOLIN  HFA) 108 (90 Base) MCG/ACT inhaler Inhale 2 puffs into the lungs every 6 (six) hours as needed for wheezing or shortness of breath. 8 g 0   amLODipine  (NORVASC ) 10 MG tablet Take 1 tablet (10 mg total) by mouth daily. 90 tablet 3   fluticasone  (FLONASE ) 50 MCG/ACT nasal spray Place 1 spray into both nostrils 2 (two) times daily. 16 g 2   losartan  (COZAAR ) 25 MG tablet Take 1 tablet (25 mg total) by mouth daily. 30 tablet 2   pantoprazole  (PROTONIX ) 40 MG tablet Take 1 tablet (40 mg total) by mouth daily. 30 tablet 2   rizatriptan  (MAXALT -MLT) 10 MG disintegrating tablet TAKE 1 TABLET BY MOUTH ONCE AS NEEDED FOR MIGRAINE. MAY REPEAT IN 2 HOURS IF NEEDED. 10 tablet 2   No facility-administered medications prior to visit.    Allergies  Allergen Reactions   Bee Venom Shortness Of Breath  Latex Itching   Lisinopril Cough    ROS Review of Systems  Constitutional:  Positive for fatigue. Negative for chills and fever.  HENT:  Positive for congestion, postnasal drip, sinus pressure, sinus pain and sore throat.   Eyes:  Negative for pain and discharge.  Respiratory:  Negative for cough and shortness of breath.   Cardiovascular:  Negative for chest pain and palpitations.  Gastrointestinal:  Negative for abdominal pain,  diarrhea, nausea and vomiting.  Endocrine: Negative for polydipsia and polyuria.  Genitourinary:  Negative for dysuria and hematuria.  Musculoskeletal:  Negative for neck pain and neck stiffness.  Skin:  Negative for rash.  Neurological:  Positive for headaches. Negative for dizziness and weakness.  Psychiatric/Behavioral:  Negative for agitation and behavioral problems. The patient is nervous/anxious.       Objective:    Physical Exam Vitals reviewed.  Constitutional:      General: She is not in acute distress.    Appearance: She is obese. She is not diaphoretic.  HENT:     Head: Normocephalic and atraumatic.     Nose: Congestion present.     Right Sinus: Frontal sinus tenderness present.     Left Sinus: Frontal sinus tenderness present.     Mouth/Throat:     Mouth: Mucous membranes are moist.     Pharynx: Posterior oropharyngeal erythema present.  Eyes:     General: No scleral icterus.    Extraocular Movements: Extraocular movements intact.  Cardiovascular:     Rate and Rhythm: Normal rate and regular rhythm.     Pulses: Normal pulses.     Heart sounds: Normal heart sounds. No murmur heard. Pulmonary:     Breath sounds: Normal breath sounds. No wheezing or rales.  Musculoskeletal:     Cervical back: Neck supple. No tenderness.     Right lower leg: No edema.     Left lower leg: No edema.  Skin:    General: Skin is warm.     Findings: No rash.  Neurological:     General: No focal deficit present.     Mental Status: She is alert and oriented to person, place, and time.     Cranial Nerves: No cranial nerve deficit.     Sensory: No sensory deficit.     Motor: No weakness.  Psychiatric:        Mood and Affect: Mood normal.        Behavior: Behavior normal.     BP 110/73   Pulse 93   Ht 5' (1.524 m)   Wt 197 lb 3.2 oz (89.4 kg)   SpO2 96%   BMI 38.51 kg/m  Wt Readings from Last 3 Encounters:  06/18/24 197 lb 3.2 oz (89.4 kg)  03/04/24 188 lb 4.4 oz (85.4 kg)   02/27/24 188 lb 4.4 oz (85.4 kg)    Lab Results  Component Value Date   TSH 2.830 01/13/2023   Lab Results  Component Value Date   WBC 12.4 (H) 01/07/2024   HGB 16.0 (H) 01/07/2024   HCT 46.8 (H) 01/07/2024   MCV 96.5 01/07/2024   PLT 332 01/07/2024   Lab Results  Component Value Date   NA 140 01/15/2024   K 3.8 01/15/2024   CO2 19 (L) 01/15/2024   GLUCOSE 80 01/15/2024   BUN 11 01/15/2024   CREATININE 0.77 01/15/2024   BILITOT 0.6 01/07/2024   ALKPHOS 88 01/07/2024   AST 20 01/07/2024   ALT 16 01/07/2024   PROT 7.3  01/07/2024   ALBUMIN 4.3 01/07/2024   CALCIUM 9.4 01/15/2024   ANIONGAP 9 01/07/2024   EGFR 97 01/15/2024   Lab Results  Component Value Date   CHOL 180 12/01/2023   Lab Results  Component Value Date   HDL 37 (L) 12/01/2023   Lab Results  Component Value Date   LDLCALC 110 (H) 12/01/2023   Lab Results  Component Value Date   TRIG 186 (H) 12/01/2023   Lab Results  Component Value Date   CHOLHDL 4.9 (H) 12/01/2023   Lab Results  Component Value Date   HGBA1C 5.4 12/01/2023      Assessment & Plan:   Problem List Items Addressed This Visit       Cardiovascular and Mediastinum   Essential hypertension - Primary   BP Readings from Last 1 Encounters:  06/18/24 110/73   Well-controlled with amlodipine  10 mg QD and Losartan  25 mg QD Counseled for compliance with the medications Advised DASH diet and moderate exercise/walking, at least 150 mins/week      Relevant Medications   losartan  (COZAAR ) 25 MG tablet   amLODipine  (NORVASC ) 10 MG tablet   Other Relevant Orders   TSH   CMP14+EGFR   CBC with Differential/Platelet   Migraine   Well-controlled with PRN Maxalt , refilled If frequent episodes of migraine, will add topiramate  - has tried it before      Relevant Medications   losartan  (COZAAR ) 25 MG tablet   rizatriptan  (MAXALT -MLT) 10 MG disintegrating tablet   amLODipine  (NORVASC ) 10 MG tablet     Respiratory   Asthma,  mild persistent   Uncontrolled with Stiolto and albuterol  as needed Has required Albuterol  more frequently recently Switched to Airsupra  for better efficacy, coupon provided Has seen pulmonology      Relevant Medications   Albuterol -Budesonide (AIRSUPRA ) 90-80 MCG/ACT AERO   Acute non-recurrent frontal sinusitis   Started empiric Augmentin  as she has persistent symptoms despite symptomatic treatment Flonase  for nasal congestion Advised to use vaporizer and/or sinus inhaler as needed for nasal congestion      Relevant Medications   fluticasone  (FLONASE ) 50 MCG/ACT nasal spray   amoxicillin -clavulanate (AUGMENTIN ) 875-125 MG tablet     Digestive   GERD (gastroesophageal reflux disease)   Well controlled with pantoprazole  now Had persistent acid reflux while taking Pepcid       Relevant Medications   pantoprazole  (PROTONIX ) 40 MG tablet     Other   Morbid obesity due to excess calories (HCC) (Chronic)   BMI Readings from Last 3 Encounters:  06/18/24 38.51 kg/m  03/04/24 36.77 kg/m  02/27/24 36.77 kg/m   Has tried low-carb diet and exercise regimen Has been on phentermine  - lost about 12 lbs in the past She is a good candidate for GLP-1 agonist Has lost coverage for Wegovy  -initial BMI: 40.66, had lost 21 lbs in the past, will apply for appeal Had advised to think about bariatric surgery referral in the past   Advised to follow-up low-carb diet, do portion control and guided to use other resources to adhere to the diet such as apps (Noom, healthi)      Hyperlipidemia   Last lipid profile reviewed DCed Fenofibrate  to reduce pill burden Check lipid profile      Relevant Medications   losartan  (COZAAR ) 25 MG tablet   amLODipine  (NORVASC ) 10 MG tablet   Other Relevant Orders   Lipid panel   Other Visit Diagnoses       Hyperglycemia       Relevant  Orders   Hemoglobin A1c   CMP14+EGFR     Vitamin D  deficiency       Relevant Orders   VITAMIN D  25 Hydroxy (Vit-D  Deficiency, Fractures)         Meds ordered this encounter  Medications   fluticasone  (FLONASE ) 50 MCG/ACT nasal spray    Sig: Place 1 spray into both nostrils 2 (two) times daily.    Dispense:  16 g    Refill:  2   losartan  (COZAAR ) 25 MG tablet    Sig: Take 1 tablet (25 mg total) by mouth daily.    Dispense:  90 tablet    Refill:  3    This prescription was filled on 03/05/2024. Any refills authorized will be placed on file.   pantoprazole  (PROTONIX ) 40 MG tablet    Sig: Take 1 tablet (40 mg total) by mouth daily.    Dispense:  30 tablet    Refill:  5   rizatriptan  (MAXALT -MLT) 10 MG disintegrating tablet    Sig: TAKE 1 TABLET BY MOUTH ONCE AS NEEDED FOR MIGRAINE. MAY REPEAT IN 2 HOURS IF NEEDED.    Dispense:  10 tablet    Refill:  2   amLODipine  (NORVASC ) 10 MG tablet    Sig: Take 1 tablet (10 mg total) by mouth daily.    Dispense:  90 tablet    Refill:  3   amoxicillin -clavulanate (AUGMENTIN ) 875-125 MG tablet    Sig: Take 1 tablet by mouth 2 (two) times daily.    Dispense:  14 tablet    Refill:  0   Albuterol -Budesonide (AIRSUPRA ) 90-80 MCG/ACT AERO    Sig: Inhale 2 puffs into the lungs every 6 (six) hours as needed (Shortness of breath or wheezing).    Dispense:  33 g    Refill:  5    Follow-up: Return in about 5 months (around 11/18/2024) for HTN.    Suzzane MARLA Blanch, MD

## 2024-06-18 NOTE — Assessment & Plan Note (Signed)
 Started empiric Augmentin  as she has persistent symptoms despite symptomatic treatment Flonase  for nasal congestion Advised to use vaporizer and/or sinus inhaler as needed for nasal congestion

## 2024-06-18 NOTE — Assessment & Plan Note (Signed)
Last lipid profile reviewed DCed Fenofibrate to reduce pill burden Check lipid profile

## 2024-06-18 NOTE — Assessment & Plan Note (Signed)
 BMI Readings from Last 3 Encounters:  06/18/24 38.51 kg/m  03/04/24 36.77 kg/m  02/27/24 36.77 kg/m   Has tried low-carb diet and exercise regimen Has been on phentermine  - lost about 12 lbs in the past She is a good candidate for GLP-1 agonist Has lost coverage for Wegovy  -initial BMI: 40.66, had lost 21 lbs in the past, will apply for appeal Had advised to think about bariatric surgery referral in the past   Advised to follow-up low-carb diet, do portion control and guided to use other resources to adhere to the diet such as apps (Noom, healthi)

## 2024-06-22 ENCOUNTER — Telehealth: Payer: Self-pay

## 2024-06-22 NOTE — Telephone Encounter (Signed)
-----   Message from Suzzane MARLA Blanch sent at 06/22/2024  4:36 PM EDT ----- Please provide her an acute visit. ----- Message ----- From: Bertrum Rosina HERO, RN Sent: 06/22/2024   3:38 PM EDT To: Suzzane MARLA Blanch, MD; [facility]  Call with patient this afternoon:  The patient reports that she is still not feeling well despite starting Augmentin  on Friday. She is experiencing fever, chills, diarrhea, and generalized malaise, which prevented her from working today. Given her ongoing symptoms, she may benefit from an acute care visit for further evaluation.  Rosina Bertrum, BSN RN [facility] RN Care Manager Direct Dial: [phone]  [phone]

## 2024-06-22 NOTE — Telephone Encounter (Signed)
 Called to schedule patient an acute visit per Dr. [name]. Patient elected to schedule an appointment for  Friday June 25, 2024 at 1:40pm due to work schedule. Patient scheduled to see [facility]. Patient stated she will call back for a sooner appt if she can't tolerate symptoms  Stefano ORN, CMA  [facility] AWV Team

## 2024-06-23 ENCOUNTER — Telehealth: Payer: Self-pay | Admitting: *Deleted

## 2024-06-23 NOTE — Patient Instructions (Signed)
 Lexxi L Ramirez-Garcia - I am sorry I was unable to reach you today for our scheduled appointment. I work with [name], [name], MD and am calling to support your healthcare needs. Please contact me at [phone] at your earliest convenience. I look forward to speaking with you soon.   Thank you,  Hendricks Her RN, BSN  [place] [facility] RN Case Manager   Direct [phone]

## 2024-06-25 ENCOUNTER — Ambulatory Visit: Admitting: Nurse Practitioner

## 2024-06-25 ENCOUNTER — Telehealth: Admitting: *Deleted

## 2024-06-25 ENCOUNTER — Other Ambulatory Visit: Payer: Self-pay | Admitting: Internal Medicine

## 2024-06-27 ENCOUNTER — Encounter: Payer: Self-pay | Admitting: Emergency Medicine

## 2024-06-27 ENCOUNTER — Ambulatory Visit
Admission: EM | Admit: 2024-06-27 | Discharge: 2024-06-27 | Disposition: A | Discharge status: Home / Self Care | Attending: Nurse Practitioner | Admitting: Nurse Practitioner

## 2024-06-27 DIAGNOSIS — J0140 Acute pansinusitis, unspecified: Secondary | ICD-10-CM

## 2024-06-27 MED ORDER — MOXIFLOXACIN HCL 400 MG PO TABS: 400.0000 mg | ORAL_TABLET | Freq: Every day | ORAL | 0 refills | Status: AC

## 2024-06-27 MED ORDER — DEXAMETHASONE SODIUM PHOSPHATE 10 MG/ML IJ SOLN
10.0000 mg | INTRAMUSCULAR | Status: AC
  Administered 2024-06-27: 10 mg via INTRAMUSCULAR

## 2024-06-27 MED ORDER — FLUTICASONE PROPIONATE 50 MCG/ACT NA SUSP: 2.0000 | Freq: Every day | NASAL | 0 refills | Status: DC

## 2024-06-27 NOTE — ED Triage Notes (Signed)
 Diagnosed with sinus infection and was given Augmentin  on last Friday.  States symptoms have become worse with facial pressure and nasal congestion.

## 2024-06-27 NOTE — Discharge Instructions (Addendum)
 You were given an injection of Decadron  10 mg. Take medication as prescribed. Increase fluids and allow for plenty of rest. You may take over-the-counter Tylenol  as needed for pain, fever, or general discomfort. Recommend normal saline nasal spray throughout the day for nasal congestion and runny nose. If symptoms fail to improve with this treatment, follow-up with your PCP for further evaluation. Follow-up as needed.

## 2024-06-27 NOTE — ED Provider Notes (Signed)
 [facility]    CSN: [id number] Arrival date & time: 06/27/24  1418      History   Chief Complaint No chief complaint on file.   HPI Destiny Petty is a 43 y.o. female.   The history is provided by the patient.   Patient presents for complaints of nasal congestion, sinus pressure, and headache.  Patient states that she was treated for sinusitis over the past week with Augmentin .  States that she took the medication and felt better for 2 days, but symptoms began to worsen.  Patient states she has continued congestion, headache, and sinus pressure.  Patient states that she does have an underlying history of seasonal allergies.  Patient states that she did have fever when symptoms initially started but that is since improved.  Denies ear pain, ear drainage, wheezing, difficulty breathing, abdominal pain, nausea, vomiting, diarrhea, or rash. 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 Scheduled for Cholecystectomy   GERD (gastroesophageal reflux disease)    Hemorrhoids 02/10/2015   Hiatal hernia    Hyperlipidemia 04/09/2011   Hypertension    no meds   Injury of digital nerve of right little finger 08/23/2020   Laceration of blood vessel of right little finger 08/23/2020   Laceration of finger of left hand without damage to nail    left small finger   Menorrhagia with regular cycle  04/10/2016   Migraine 01/15/2016   Overview:  Overview:  IMO Load 2016 R1.3   Muscle spasm of back 03/13/2016   Numbness and tingling in left hand 08/19/2016   Obesity    Ovarian cyst 12/18/2016   Perianal wart 02/10/2015   Stress 02/14/2016   Weight loss counseling, encounter for 03/13/2016    Patient Active Problem List   Diagnosis Date Noted   Acute non-recurrent frontal sinusitis 06/18/2024   Cecal polyp 03/04/2024   Syncope and collapse 01/07/2024   BMI 37.0-37.9, adult 01/07/2024   Diverticulitis of colon 01/07/2024   Chronic idiopathic constipation 01/07/2024   Encounter for examination following treatment at hospital 12/29/2023   Diverticulitis 12/29/2023   Loss of taste 02/25/2023   Acute pain of right shoulder 02/25/2023   Prolapsed internal hemorrhoids, grade 3    DOE (dyspnea on exertion) 03/26/2021   Cigarette smoker 03/26/2021   Environmental and seasonal allergies 07/28/2020   Arthritis of left knee 06/01/2020   Encounter for general adult medical examination with abnormal findings 06/01/2020   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   Back pain with right-sided sciatica 06/18/2016   Morbid obesity due to excess calories (HCC) 03/13/2016   Migraine 01/15/2016   Recurrent major depressive disorder (HCC) 01/15/2016   Hemorrhoid 02/10/2015   Tobacco abuse 07/10/2013   GERD (gastroesophageal reflux disease) 07/10/2013   Essential hypertension 07/08/2013   Asthma, mild persistent 04/05/2013   Orthopnea 12/18/2011  Hyperlipidemia 04/09/2011    Past Surgical History:  Procedure Laterality Date   ABLATION     CESAREAN SECTION     CESAREAN SECTION N/A    Phreesia 05/23/2020   CHOLECYSTECTOMY     COLONOSCOPY     COLONOSCOPY WITH PROPOFOL  N/A 03/04/2024   Procedure: COLONOSCOPY WITH PROPOFOL ;  Surgeon: Cinderella Deatrice FALCON, MD;  Location: [facility];  Service: Endoscopy;  Laterality: N/A;  7:30AM;ASA 3   DILATATION AND CURETTAGE/HYSTEROSCOPY WITH  MINERVA N/A 08/31/2019   Procedure: DILATATION AND CURETTAGE (no specimen) /HYSTEROSCOPY WITH MINERVA ENDOMETRIAL ABLATION;  Surgeon: Edsel Norleen GAILS, MD;  Location: [facility];  Service: Gynecology;  Laterality: N/A;   ESOPHAGOGASTRODUODENOSCOPY ENDOSCOPY     HEMORRHOID SURGERY N/A 05/10/2022   Procedure: HEMORRHOIDECTOMY, EXTENSIVE;  Surgeon: Mavis Anes, MD;  Location: [facility];  Service: General;  Laterality: N/A;   POLYPECTOMY  03/04/2024   Procedure: POLYPECTOMY;  Surgeon: Cinderella Deatrice FALCON, MD;  Location: [facility];  Service: Endoscopy;;   TENDON EXPLORATION Left 08/10/2020   Procedure: LEFT SMALL FINER EXPLORATION, REPAIR OF TENDON, ARTERY, NERVE;  Surgeon: Murrell Drivers, MD;  Location: [facility];  Service: Orthopedics;  Laterality: Left;   TUBAL LIGATION      OB History     Gravida  2   Para  2   Term  2   Preterm      AB      Living  2      SAB      IAB      Ectopic      Multiple      Live Births  2            Home Medications    Prior to Admission medications   Medication Sig Start Date End Date Taking? Authorizing Provider  Albuterol -Budesonide (AIRSUPRA ) 90-80 MCG/ACT AERO Inhale 2 puffs into the lungs every 6 (six) hours as needed (Shortness of breath or wheezing). 06/18/24   [name] [name], MD  amLODipine  (NORVASC ) 10 MG tablet Take 1 tablet (10 mg total) by mouth daily. 06/18/24   [name] [name], MD  cetirizine  (ZYRTEC ) 10 MG tablet Take 1 tablet (10 mg total) by mouth daily. 03/21/23   Del Wilhelmena Lloyd Sola, FNP  fluticasone  (FLONASE ) 50 MCG/ACT nasal spray Place 1 spray into both nostrils 2 (two) times daily. 06/18/24   [name] [name], MD  HYDROcodone -acetaminophen  (NORCO/VICODIN) 5-325 MG tablet Take 1 tablet by mouth every 6 (six) hours as needed. 01/07/24   Zackowski, Scott, MD  losartan  (COZAAR ) 25 MG tablet Take 1 tablet (25 mg total) by mouth daily. 06/18/24   [name] [name], MD  ondansetron  (ZOFRAN ) 4 MG tablet Take 1 tablet (4  mg total) by mouth every 8 (eight) hours as needed for nausea or vomiting. 11/20/23   Melvenia Manus BRAVO, MD  pantoprazole  (PROTONIX ) 40 MG tablet Take 1 tablet (40 mg total) by mouth daily. 06/18/24   [name] [name], MD  rizatriptan  (MAXALT -MLT) 10 MG disintegrating tablet TAKE 1 TABLET BY MOUTH ONCE AS NEEDED FOR MIGRAINE. MAY REPEAT IN 2 HOURS IF NEEDED. 06/18/24   [name] [name], MD  Semaglutide -Weight Management (WEGOVY ) 1 MG/0.5ML SOAJ Inject 1 mg into the skin every 7 (seven) days. 05/06/24   [name] [name], MD  Tiotropium Bromide -Olodaterol 2.5-2.5 MCG/ACT AERS Inhale 2 puffs into the lungs daily. 03/21/23   Del Wilhelmena Lloyd Sola, FNP  VENTOLIN  HFA 108 (90 Base) MCG/ACT inhaler INHALE 1 TO 2  PUFFS INTO THE LUNGS EVERY 6 HOURS AS NEEDED FOR WHEEZING OR SHORTNESS OF BREATH 06/25/24   [name] [name], MD    Family History Family History  Problem Relation Age of Onset   Diabetes Mother    Kidney failure Mother    Early death Mother 48       diabetes complications   Cancer Father 64       colon cancer   Depression Sister    Hypertension Sister    Anxiety disorder Sister    Other Sister        back problems   ADD / ADHD Daughter    Other Daughter        behavioral issues   ODD Daughter    Asthma Son    Cancer Maternal Grandmother        breast   Seizures Maternal Grandmother    Lupus Other    Breast cancer Maternal Aunt     Social History Social History   Tobacco Use   Smoking status: Every Day    Current packs/day: 1.00    Average packs/day: 1 pack/day for 26.0 years (26.0 ttl pk-yrs)    Types: Cigarettes    Passive exposure: Never   Smokeless tobacco: Never   Tobacco comments:    1/4 ppd 05/30/21  Vaping Use   Vaping status: Never Used  Substance Use Topics   Alcohol use: Yes    Comment: occ   Drug use: No     Allergies   Bee venom, Latex, and Lisinopril   Review of Systems Review of Systems Per HPI  Physical Exam Triage Vital Signs ED Triage Vitals   Encounter Vitals Group     BP 06/27/24 1431 138/84     Girls Systolic BP Percentile --      Girls Diastolic BP Percentile --      Boys Systolic BP Percentile --      Boys Diastolic BP Percentile --      Pulse Rate 06/27/24 1431 98     Resp 06/27/24 1431 18     Temp 06/27/24 1431 98.2 F (36.8 C)     Temp Source 06/27/24 1431 Oral     SpO2 06/27/24 1431 96 %     Weight --      Height --      Head Circumference --      Peak Flow --      Pain Score 06/27/24 1433 10     Pain Loc --      Pain Education --      Exclude from Growth Chart --    No data found.  Updated Vital Signs BP 138/84 (BP Location: Right Arm)   Pulse 98   Temp 98.2 F (36.8 C) (Oral)   Resp 18   SpO2 96%   Visual Acuity Right Eye Distance:   Left Eye Distance:   Bilateral Distance:    Right Eye Near:   Left Eye Near:    Bilateral Near:     Physical Exam Vitals and nursing note reviewed.  Constitutional:      General: She is not in acute distress.    Appearance: Normal appearance.  HENT:     Head: Normocephalic.     Right Ear: Tympanic membrane, ear canal and external ear normal.     Left Ear: Ear canal and external ear normal. Tympanic membrane is erythematous and bulging.     Nose: Congestion present.     Right Turbinates: Enlarged  and swollen.     Left Turbinates: Enlarged and swollen.     Right Sinus: Maxillary sinus tenderness and frontal sinus tenderness present.     Left Sinus: Maxillary sinus tenderness and frontal sinus tenderness present.     Mouth/Throat:     Lips: Pink.     Mouth: Mucous membranes are moist.     Pharynx: Postnasal drip present. No pharyngeal swelling, oropharyngeal exudate, posterior oropharyngeal erythema or uvula swelling.     Comments: Cobblestoning present to posterior oropharynx  Eyes:     Extraocular Movements: Extraocular movements intact.     Pupils: Pupils are equal, round, and reactive to light.  Cardiovascular:     Rate and Rhythm: Normal rate and  regular rhythm.     Pulses: Normal pulses.     Heart sounds: Normal heart sounds.  Pulmonary:     Effort: Pulmonary effort is normal. No respiratory distress.     Breath sounds: Normal breath sounds. No stridor. No wheezing, rhonchi or rales.  Abdominal:     General: Bowel sounds are normal.     Palpations: Abdomen is soft.     Tenderness: There is no abdominal tenderness.  Musculoskeletal:     Cervical back: Normal range of motion.  Skin:    General: Skin is warm and dry.  Neurological:     General: No focal deficit present.     Mental Status: She is alert and oriented to person, place, and time.  Psychiatric:        Mood and Affect: Mood normal.        Behavior: Behavior normal.      UC Treatments / Results  Labs (all labs ordered are listed, but only abnormal results are displayed) Labs Reviewed - No data to display  EKG   Radiology No results found.  Procedures Procedures (including critical care time)  Medications Ordered in UC Medications - No data to display  Initial Impression / Assessment and Plan / UC Course  I have reviewed the triage vital signs and the nursing notes.  Pertinent labs & imaging results that were available during my care of the patient were reviewed by me and considered in my medical decision making (see chart for details).  Patient with ongoing sinusitis, she does have moderate congestion noted on exam with facial pressure and sinus tenderness.  Patient completed course of Augmentin  with minimal relief of her symptoms.  Decadron  10 mg IM administered.  Will start patient on moxifloxacin  400 mg daily for the next 7 days for bacterial sinusitis with antibiotic failure.  Fluticasone  50 mcg nasal spray also prescribed.  Supportive care recommendations were provided and discussed with the patient to include fluids, rest, over-the-counter analgesics, and use of normal saline nasal spray.  Patient was advised if symptoms fail to improve with this  treatment, recommend follow-up with her PCP for further evaluation.  Patient was in agreement with this plan of care and verbalizes understanding.  All questions were answered.  Patient stable for discharge.  Final Clinical Impressions(s) / UC Diagnoses   Final diagnoses:  None   Discharge Instructions   None    ED Prescriptions   None    PDMP not reviewed this encounter.   Gilmer Etta PARAS, NP 06/27/24 1513

## 2024-06-30 ENCOUNTER — Other Ambulatory Visit (HOSPITAL_COMMUNITY): Payer: Self-pay

## 2024-06-30 ENCOUNTER — Telehealth: Payer: Self-pay | Admitting: Pharmacy Technician

## 2024-06-30 ENCOUNTER — Encounter: Payer: Self-pay | Admitting: Pharmacy Technician

## 2024-06-30 ENCOUNTER — Other Ambulatory Visit: Payer: Self-pay | Admitting: Internal Medicine

## 2024-06-30 DIAGNOSIS — G43719 Chronic migraine without aura, intractable, without status migrainosus: Secondary | ICD-10-CM

## 2024-06-30 MED ORDER — RIZATRIPTAN BENZOATE 10 MG PO TABS: 10.0000 mg | ORAL_TABLET | ORAL | 2 refills | Status: DC | PRN

## 2024-06-30 NOTE — Telephone Encounter (Signed)
 Pharmacy Patient Advocate Encounter   Received notification from CoverMyMeds that prior authorization for Rizatriptan  Benzoate 10MG  dispersible tablets  is required/requested.   Insurance verification completed.   The patient is insured through [facility] .   Per test claim:  Rizatriptan  plain tablets is preferred by the insurance.  If suggested medication is appropriate, Please send in a new RX and discontinue this one. If not, please advise as to why it's not appropriate so that we may request a Prior Authorization. Please note, some preferred medications may still require a PA.  If the suggested medications have not been trialed and there are no contraindications to their use, the PA will not be submitted, as it will not be approved.  Patient has new insurance effective 04/23/2024. [employer] covers the regular tablets vs the dispersible tablets. Can therapy be changed to the regular tablets?

## 2024-06-30 NOTE — Telephone Encounter (Signed)
 error

## 2024-07-01 NOTE — Telephone Encounter (Signed)
Pt informed

## 2024-07-22 ENCOUNTER — Ambulatory Visit: Payer: Self-pay

## 2024-07-22 ENCOUNTER — Ambulatory Visit (INDEPENDENT_AMBULATORY_CARE_PROVIDER_SITE_OTHER): Admitting: Nurse Practitioner

## 2024-07-22 ENCOUNTER — Encounter: Payer: Self-pay | Admitting: Nurse Practitioner

## 2024-07-22 ENCOUNTER — Other Ambulatory Visit: Payer: Self-pay | Admitting: Internal Medicine

## 2024-07-22 VITALS — BP 116/67 | HR 87 | Ht 64.0 in | Wt 203.0 lb

## 2024-07-22 DIAGNOSIS — J440 Chronic obstructive pulmonary disease with acute lower respiratory infection: Secondary | ICD-10-CM | POA: Diagnosis not present

## 2024-07-22 DIAGNOSIS — J309 Allergic rhinitis, unspecified: Secondary | ICD-10-CM | POA: Diagnosis not present

## 2024-07-22 DIAGNOSIS — J209 Acute bronchitis, unspecified: Secondary | ICD-10-CM | POA: Insufficient documentation

## 2024-07-22 DIAGNOSIS — J0110 Acute frontal sinusitis, unspecified: Secondary | ICD-10-CM

## 2024-07-22 MED ORDER — LEVOFLOXACIN 500 MG PO TABS: 500.0000 mg | ORAL_TABLET | Freq: Every day | ORAL | 0 refills | Status: AC

## 2024-07-22 MED ORDER — METHYLPREDNISOLONE SODIUM SUCC 125 MG IJ SOLR
125.0000 mg | Freq: Once | INTRAMUSCULAR | Status: AC
  Administered 2024-07-22: 125 mg via INTRAMUSCULAR

## 2024-07-22 MED ORDER — METHYLPREDNISOLONE 4 MG PO TBPK: ORAL_TABLET | ORAL | 0 refills | Status: DC

## 2024-07-22 NOTE — Progress Notes (Signed)
 Patient receiving Solu-Medrol  125MG  injection per MD order  The injection site was cleaned and prepped with alcohol. A band aid applied after injection given.   IM injection  Medication: Solu-Medrol   Dose: 2 MG Location: left Ventrogluteal Lot: [id number] Exp: 04/2026  Patient tolerated well, no complications were noted  Performed by: Carlos, CMA

## 2024-07-22 NOTE — Telephone Encounter (Signed)
 FYI Only or Action Required?: Action required by provider: request for appointment.  Patient was last seen in primary care on 06/18/2024 by [name] [name], MD.  Called Nurse Triage reporting Sinusitis.  Symptoms began over a month ago.  Interventions attempted: OTC medications: ibuprofen , tylenol , alka seltzer  and Prescription medications: Augmentin , Moxifloxacin , steroid shot, Flonase .  Symptoms are: sinus pain/pressure, productive cough, chest congestion, bilateral ear pain and feels like fluid, right ear swelling, nasal discharge, self reports wheezing (none present while wheezing or labored breathing noted) gradually worsening.  Triage Disposition: See HCP Within 4 Hours (Or PCP Triage)  Patient/caregiver understands and will follow disposition?: No, wishes to speak with PCP                   Copied from CRM #[id number]. Topic: Clinical - Red Word Triage >> Jul 22, 2024 11:02 AM Destiny Petty wrote: Red Word that prompted transfer to Nurse Triage: patient has ear infection in both ears/fluids/swollen, was prescribed medication before and It did not seem to help. She also has a sinus infection. She would like to come in for a steroid shot. She mentioned she has been wheezing. Chest in fluid. Reason for Disposition  [1] SEVERE sinus pain (e.g., excruciating) AND [2] not improved 2 hours after pain medicine  Answer Assessment - Initial Assessment Questions 1. LOCATION: Where does it hurt?      Across forehead, across nose,  2. ONSET: When did the sinus pain start?  (e.g., hours, days)      A little over a month. Seen with PCP 06/18/24 and in urgent care on 7/20.  3. SEVERITY: How bad is the pain?   (Scale 0-10; or none, mild, moderate or severe)     10/10 over the past 3 days.  4. RECURRENT SYMPTOM: Have you ever had sinus problems before? If Yes, ask: When was the last time? and What happened that time?      Yes, over the past month. She states she usually  gets a sinus infection this time of year.  5. NASAL CONGESTION: Is the nose blocked? If Yes, ask: Can you open it or must you breathe through your mouth?     Yes she states it does get congested.  6. NASAL DISCHARGE: Do you have discharge from your nose? If so ask, What color?     Yes. She states a ton of nasal discharge. She states mostly clear with yellow.  7. FEVER: Do you have a fever? If Yes, ask: What is it, how was it measured, and when did it start?      Yes, 101 to 102, last fever was Saturday. Taken with oral thermometer.  8. OTHER SYMPTOMS: Do you have any other symptoms? (e.g., sore throat, cough, earache, difficulty breathing)     In my chest, been wheezing since Friday it's all the time like SOB (no wheezing noted, no labored breathing noted and patient speaking in full sentences), feels like fluid in both ears, bilateral earache (worse on right), right ear swelling, productive cough.  9. PREGNANCY: Is there any chance you are pregnant? When was your last menstrual period?     LMP: she states she does not have one anymore due to partial ablation.  Protocols used: Sinus Pain or Congestion-A-AH

## 2024-07-22 NOTE — Addendum Note (Signed)
 Addended by: GRETTA MASTERS R on: 07/22/2024 05:00 PM   Modules accepted: Orders

## 2024-07-22 NOTE — Progress Notes (Signed)
 Established Patient Office Visit  Subjective:  Patient ID: Destiny Petty, female    DOB: 12-28-1980  Age: 43 y.o. MRN: [id number]  No chief complaint on file.   Patient reports for over a month, nasal cavities were swollen.  She was seen by Dr. [name] and had an antibiotic.  Went to urgent care, left otalgia and was told she had an ear infection.  She states her steroid injection x 1 month ago.  She would like that again.  She states the symptoms have returned.  She can feel pressure behind both ears and right sided posterior aural edema.    No other concerns at this time.   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 Scheduled for Cholecystectomy   GERD (gastroesophageal reflux disease)    Hemorrhoids 02/10/2015   Hiatal hernia    Hyperlipidemia 04/09/2011   Hypertension    no meds   Injury of digital nerve of right little finger 08/23/2020   Laceration of blood vessel of right little finger 08/23/2020   Laceration of finger of left hand without damage to nail    left small finger   Menorrhagia with regular cycle 04/10/2016   Migraine 01/15/2016   Overview:  Overview:  IMO Load 2016 R1.3   Muscle spasm of back 03/13/2016   Numbness and tingling in left hand 08/19/2016   Obesity    Ovarian cyst 12/18/2016   Perianal wart 02/10/2015   Stress 02/14/2016   Weight loss counseling,  encounter for 03/13/2016    Past Surgical History:  Procedure Laterality Date   ABLATION     CESAREAN SECTION     CESAREAN SECTION N/A    Phreesia 05/23/2020   CHOLECYSTECTOMY     COLONOSCOPY     COLONOSCOPY WITH PROPOFOL  N/A 03/04/2024   Procedure: COLONOSCOPY WITH PROPOFOL ;  Surgeon: Cinderella Deatrice FALCON, MD;  [facility];  Service: Endoscopy;  Laterality: N/A;  7:30AM;ASA 3   DILATATION AND CURETTAGE/HYSTEROSCOPY WITH MINERVA N/A 08/31/2019   Procedure: DILATATION AND CURETTAGE (no specimen) /HYSTEROSCOPY WITH MINERVA ENDOMETRIAL ABLATION;  Surgeon: Edsel Norleen GAILS, MD;  Location: [facility];  Service: Gynecology;  Laterality: N/A;   ESOPHAGOGASTRODUODENOSCOPY ENDOSCOPY     HEMORRHOID SURGERY N/A 05/10/2022   Procedure: HEMORRHOIDECTOMY, EXTENSIVE;  Surgeon: Mavis Anes, MD;  Location: [facility];  Service: General;  Laterality: N/A;   POLYPECTOMY  03/04/2024   Procedure: POLYPECTOMY;  Surgeon: Cinderella Deatrice FALCON, MD;  [facility];  Service: Endoscopy;;   TENDON EXPLORATION Left 08/10/2020   Procedure: LEFT SMALL FINER EXPLORATION, REPAIR OF TENDON, ARTERY, NERVE;  Surgeon: Murrell Drivers, MD;  Location: [facility];  Service: Orthopedics;  Laterality: Left;   TUBAL LIGATION      Social History   Socioeconomic History   Marital status: Legally Separated    Spouse name: Not  on file   Number of children: 2   Years of education: 14   Highest education level: Not on file  Occupational History   Not on file  Tobacco Use   Smoking status: Every Day    Current packs/day: 1.00    Average packs/day: 1 pack/day for 26.0 years (26.0 ttl pk-yrs)    Types: Cigarettes    Passive exposure: Never   Smokeless tobacco: Never   Tobacco comments:    1/4 ppd 05/30/21  Vaping Use   Vaping status: Never Used  Substance and Sexual Activity   Alcohol use: Yes    Comment: occ   Drug use: No   Sexual activity: Not Currently    Birth control/protection: Surgical     Comment: tubal  Other Topics Concern   Not on file  Social History Narrative   Legally separated right now   2 children: son 71 and daughter 68 lives with her      2 dogs: macho, mary jane   2 birds:    1 cats: boghgie       Enjoys: spending time with kids, outside things      Diet: eat all food groups    Caffeine: coffee and soda daily   Water : 4 cups daily      Wears seat belt   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   No weapons        Social Drivers of Health   Financial Resource Strain: High Risk (04/20/2024)   Overall Financial Resource Strain (CARDIA)    Difficulty of Paying Living Expenses: Very hard  Food Insecurity: No Food Insecurity (05/28/2024)   Hunger Vital Sign    Worried About Running Out of Food in the Last Year: Never true    Ran Out of Food in the Last Year: Never true  Transportation Needs: No Transportation Needs (05/28/2024)   PRAPARE - Administrator, Civil Service (Medical): No    Lack of Transportation (Non-Medical): No  Physical Activity: Insufficiently Active (04/20/2024)   Exercise Vital Sign    Days of Exercise per Week: 4 days    Minutes of Exercise per Session: 30 min  Stress: Stress Concern Present (04/20/2024)   [employer] of Occupational Health - Occupational Stress Questionnaire    Feeling of Stress : Very much  Social Connections: Moderately Isolated (04/20/2024)   Social Connection and Isolation Panel    Frequency of Communication with Friends and Family: More than three times a week    Frequency of Social Gatherings with Friends and Family: More than three times a week    Attends Religious Services: More than 4 times per year    Active Member of [employer] or Organizations: No    Attends Banker Meetings: Never    Marital Status: Separated  Intimate Partner Violence: Not At Risk (05/28/2024)   Humiliation, Afraid, Rape, and Kick questionnaire    Fear of Current or Ex-Partner: No     Emotionally Abused: No    Physically Abused: No    Sexually Abused: No    Family History  Problem Relation Age of Onset   Diabetes Mother    Kidney failure Mother    Early death Mother 66       diabetes complications   Cancer Father 52       colon cancer   Depression Sister    Hypertension Sister    Anxiety disorder Sister    Other  Sister        back problems   ADD / ADHD Daughter    Other Daughter        behavioral issues   ODD Daughter    Asthma Son    Cancer Maternal Grandmother        breast   Seizures Maternal Grandmother    Lupus Other    Breast cancer Maternal Aunt     Allergies  Allergen Reactions   Bee Venom Shortness Of Breath   Latex Itching   Lisinopril Cough    Outpatient Medications Prior to Visit  Medication Sig   Albuterol-Budesonide (AIRSUPRA) 90-80 MCG/ACT AERO Inhale 2 puffs into the lungs every 6 (six) hours as needed (Shortness of breath or wheezing).   amLODipine (NORVASC) 10 MG tablet Take 1 tablet (10 mg total) by mouth daily.   cetirizine (ZYRTEC) 10 MG tablet Take 1 tablet (10 mg total) by mouth daily.   fluticasone (FLONASE) 50 MCG/ACT nasal spray Place 2 sprays into both nostrils daily.   HYDROcodone-acetaminophen (NORCO/VICODIN) 5-325 MG tablet Take 1 tablet by mouth every 6 (six) hours as needed.   losartan (COZAAR) 25 MG tablet Take 1 tablet (25 mg total) by mouth daily.   ondansetron (ZOFRAN) 4 MG tablet Take 1 tablet (4 mg total) by mouth every 8 (eight) hours as needed for nausea or vomiting.   pantoprazole (PROTONIX) 40 MG tablet Take 1 tablet (40 mg total) by mouth daily.   Semaglutide-Weight Management (WEGOVY) 1 MG/0.5ML SOAJ Inject 1 mg into the skin every 7 (seven) days.   Tiotropium Bromide-Olodaterol 2.5-2.5 MCG/ACT AERS Inhale 2 puffs into the lungs daily.   VENTOLIN HFA 108 (90 Base) MCG/ACT inhaler INHALE 1 TO 2 PUFFS INTO THE LUNGS EVERY 6 HOURS AS NEEDED FOR WHEEZING OR SHORTNESS OF BREATH   rizatriptan (MAXALT) 10 MG  tablet Take 1 tablet (10 mg total) by mouth as needed for migraine. May repeat in 2 hours if needed (Patient not taking: Reported on 07/22/2024)   No facility-administered medications prior to visit.    ROS     Objective:   BP 116/67   Pulse 87   Ht 5' 4 (1.626 m)   Wt 203 lb (92.1 kg)   BMI 34.84 kg/m   Vitals:   07/22/24 1606  BP: 116/67  Pulse: 87  Height: 5' 4 (1.626 m)  Weight: 203 lb (92.1 kg)  BMI (Calculated): 34.83    Physical Exam Vitals and nursing note reviewed.  Constitutional:      Appearance: Normal appearance.  HENT:     Head: Normocephalic.     Ears:     Comments: Bilateral bulging TMs    Nose: Congestion present.     Mouth/Throat:     Mouth: Mucous membranes are dry.  Eyes:     Pupils: Pupils are equal, round, and reactive to light.  Cardiovascular:     Rate and Rhythm: Normal rate and regular rhythm.     Pulses: Normal pulses.     Heart sounds: Normal heart sounds.  Pulmonary:     Breath sounds: Wheezing present.  Musculoskeletal:        General: Normal range of motion.     Cervical back: Normal range of motion and neck supple.  Skin:    General: Skin is warm and dry.  Neurological:     Mental Status: She is alert and oriented to person, place, and time.  Psychiatric:        Mood and Affect:  Mood normal.        Behavior: Behavior normal.      No results found for any visits on 07/22/24.  No results found for this or any previous visit (from the past 2160 hours).    Assessment & Plan:  1) Acute sinusitis - cont fluticasone  NS, levaquin  and medrol  dose pak- drink plenty of water  2) Bronchitis - medrol  dose pak   Problem List Items Addressed This Visit   None   No follow-ups on file.   Total time spent: 20 minutes  Neale Carpen, NP  07/22/2024   This document may have been prepared by [facility] Voice Recognition software and as such may include unintentional dictation errors.

## 2024-07-22 NOTE — Patient Instructions (Addendum)
 1) Steroid injection today here in office 2) Levaquin  and medrol  dose pak - drink plenty of water  to prevent possible tendon rupture as a side effect discussed with patient - she cannot tolerate Augmentin  due to vaginal candidiasis and cannot recall what she was given at urgent care so she prefers Levaquin  as her sinusitis is not resolving.   3) Follow up appt as needed, patient needs work note that she came to the office today to be seen for a sick visit

## 2024-07-22 NOTE — Telephone Encounter (Signed)
 Scheduled

## 2024-07-29 ENCOUNTER — Telehealth: Payer: Self-pay

## 2024-07-29 ENCOUNTER — Telehealth: Payer: Self-pay | Admitting: Internal Medicine

## 2024-07-29 NOTE — Telephone Encounter (Signed)
 Copied from CRM #[id number]. Topic: Clinical - Prescription Issue >> Jul 29, 2024 10:58 AM Gustabo D wrote: Semaglutide -Weight Management (WEGOVY ) 1 MG/0.5ML SOAJ- Patient wants to talk to her pcp about the prescription.

## 2024-07-29 NOTE — Telephone Encounter (Unsigned)
 Copied from CRM [id number]. Topic: Clinical - Prescription Issue >> Jul 29, 2024  4:19 PM Tiffini S wrote: Reason for CRM: Patient states that she had a missed call from the clinic for  Semaglutide -Weight Management (WEGOVY ) 1 MG/0.5ML SOAJ- called CAL twice, no answer/ please call patient back at [phone].

## 2024-08-12 ENCOUNTER — Ambulatory Visit: Payer: Self-pay

## 2024-08-12 NOTE — Telephone Encounter (Signed)
 FYI Only or Action Required?: FYI only for provider.  Patient was last seen in primary care on 07/22/2024 by Glennon Sand, NP.  Called Nurse Triage reporting Fatigue.  Symptoms began several weeks ago.  Interventions attempted: Nothing.  Symptoms are: unchanged.  Triage Disposition: See PCP Within 2 Weeks  Patient/caregiver understands and will follow disposition?: Yes    Copied from CRM [id number]. Topic: Clinical - Red Word Triage >> Aug 12, 2024  4:52 PM Wess RAMAN wrote: Red Word that prompted transfer to Nurse Triage: Felt as if she would black out and feeling nauseous. Had this feeling back in January and today is the first time it happened in a while. She was recently diagnosed with Hypertension. She stated the doctors stated she does not have diabetes. Patient was taking Semaglutide -Weight Management (WEGOVY ) 1 MG/0.5ML SOAJ but stopped in Jan 2025. Reason for Disposition  Weakness is a chronic symptom (recurrent or ongoing AND present > 4 weeks)  [1] SEVERE weakness (e.g., unable to walk or barely able to walk, requires support) AND [2] new-onset or getting worse  Answer Assessment - Initial Assessment Questions 1. DESCRIPTION: Describe how you are feeling.     Pt felt like she was going black out/ fall out/pass out 2. SEVERITY: How bad is it?  Can you stand and walk?     severe 3. ONSET: When did these symptoms begin? (e.g., hours, days, weeks, months)     today 4. CAUSE: What do you think is causing the weakness or fatigue? (e.g., not drinking enough fluids, medical problem, trouble sleeping)     Drinking fluids 5. NEW MEDICINES:  Have you started on any new medicines recently? (e.g., opioid pain medicines, benzodiazepines, muscle relaxants, antidepressants, antihistamines, neuroleptics, beta blockers)     no 6. OTHER SYMPTOMS: Do you have any other symptoms? (e.g., chest pain, fever, cough, SOB, vomiting, diarrhea, bleeding, other areas of pain)      Nausea. 7. PREGNANCY: Is there any chance you are pregnant? When was your last menstrual period?     Na  Protocols used: Weakness (Generalized) and Fatigue-A-AH

## 2024-08-13 ENCOUNTER — Telehealth (INDEPENDENT_AMBULATORY_CARE_PROVIDER_SITE_OTHER): Admitting: Internal Medicine

## 2024-08-13 ENCOUNTER — Other Ambulatory Visit (HOSPITAL_COMMUNITY): Payer: Self-pay

## 2024-08-13 ENCOUNTER — Encounter: Payer: Self-pay | Admitting: Internal Medicine

## 2024-08-13 DIAGNOSIS — I10 Essential (primary) hypertension: Secondary | ICD-10-CM | POA: Diagnosis not present

## 2024-08-13 DIAGNOSIS — R55 Syncope and collapse: Secondary | ICD-10-CM

## 2024-08-13 DIAGNOSIS — R42 Dizziness and giddiness: Secondary | ICD-10-CM

## 2024-08-13 NOTE — Patient Instructions (Signed)
 Please stop taking Losartan .  Please maintain at least 64 ounces of fluid intake in a day.  Please continue to take other medications as prescribed.  Please continue to follow low carb diet and perform moderate exercise/walking at least 150 mins/week.

## 2024-08-13 NOTE — Progress Notes (Signed)
 Virtual Visit via Video Note   Because of Destiny Petty's co-morbid illnesses, she is at least at moderate risk for complications without adequate follow up.  This format is felt to be most appropriate for this patient at this time.  All issues noted in this document were discussed and addressed.  A limited physical exam was performed with this format.      Evaluation Performed:  Follow-up visit  Date:  08/13/2024   ID:  Destiny Petty, DOB 1981-08-05, MRN [id number]  Patient Location: Home Provider Location: Office/Clinic  Participants: Patient Location of Patient: Home Location of Provider: Telehealth Consent was obtain for visit to be over via telehealth. I verified that I am speaking with the correct person using two identifiers.  PCP:  [name] [name], MD   Chief Complaint: Episode of dizziness  History of Present Illness:    Destiny Petty is a 43 y.o. female who has a video visit for complaint of episodes of dizziness.  She had an episode of dizziness yesterday while at work, when she felt almost blacking out.  She reports being sitting during the episode.  She felt warmth and nausea as well.  She has had similar episodes in the past.  Denies any episode of seizure-like activity.  The patient does not have symptoms concerning for COVID-19 infection (fever, chills, cough, or new shortness of breath).   Past Medical, Surgical, Social History, Allergies, and Medications have been Reviewed.  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 Scheduled for Cholecystectomy   GERD (gastroesophageal reflux disease)    Hemorrhoids 02/10/2015   Hiatal hernia    Hyperlipidemia 04/09/2011   Hypertension    no meds   Injury of digital nerve of right little finger 08/23/2020   Laceration of blood vessel of right little finger 08/23/2020   Laceration of finger of left hand without damage to nail    left small finger   Menorrhagia with regular cycle 04/10/2016   Migraine 01/15/2016   Overview:  Overview:  IMO Load 2016 R1.3   Muscle spasm of back 03/13/2016   Numbness and tingling in left hand 08/19/2016   Obesity    Ovarian cyst 12/18/2016   Perianal wart 02/10/2015   Stress 02/14/2016   Weight loss counseling, encounter for 03/13/2016   Past Surgical History:  Procedure Laterality Date   ABLATION     CESAREAN SECTION     CESAREAN SECTION N/A    Phreesia 05/23/2020   CHOLECYSTECTOMY     COLONOSCOPY     COLONOSCOPY WITH PROPOFOL  N/A 03/04/2024   Procedure: COLONOSCOPY WITH PROPOFOL ;  Surgeon: Cinderella Deatrice FALCON, MD;  [facility];  Service: Endoscopy;  Laterality: N/A;  7:30AM;ASA 3   DILATATION AND CURETTAGE/HYSTEROSCOPY WITH MINERVA N/A 08/31/2019   Procedure: DILATATION AND CURETTAGE (no specimen) /HYSTEROSCOPY  WITH MINERVA ENDOMETRIAL ABLATION;  Surgeon: Edsel Norleen GAILS, MD;  Location: [facility];  Service: Gynecology;  Laterality: N/A;   ESOPHAGOGASTRODUODENOSCOPY ENDOSCOPY     HEMORRHOID SURGERY N/A 05/10/2022   Procedure: HEMORRHOIDECTOMY, EXTENSIVE;  Surgeon: Mavis Anes, MD;  Location: [facility];  Service: General;  Laterality: N/A;   POLYPECTOMY  03/04/2024   Procedure: POLYPECTOMY;  Surgeon: Cinderella Deatrice FALCON, MD;  [facility];  Service: Endoscopy;;   TENDON EXPLORATION Left 08/10/2020   Procedure: LEFT SMALL FINER EXPLORATION,  REPAIR OF TENDON, ARTERY, NERVE;  Surgeon: Murrell Drivers, MD;  Location: [facility];  Service: Orthopedics;  Laterality: Left;   TUBAL LIGATION       Current Meds  Medication Sig   Albuterol -Budesonide (AIRSUPRA ) 90-80 MCG/ACT AERO Inhale 2 puffs into the lungs every 6 (six) hours as needed (Shortness of breath or wheezing).   amLODipine  (NORVASC ) 10 MG tablet Take 1 tablet (10 mg total) by mouth daily.   cetirizine  (ZYRTEC ) 10 MG tablet Take 1 tablet (10 mg total) by mouth daily.   fluticasone  (FLONASE ) 50 MCG/ACT nasal spray Place 2 sprays into both nostrils daily.   HYDROcodone -acetaminophen  (NORCO/VICODIN) 5-325 MG tablet Take 1 tablet by mouth every 6 (six) hours as needed.   ondansetron  (ZOFRAN ) 4 MG tablet Take 1 tablet (4 mg total) by mouth every 8 (eight) hours as needed for nausea or vomiting.   pantoprazole  (PROTONIX ) 40 MG tablet Take 1 tablet (40 mg total) by mouth daily.   rizatriptan  (MAXALT ) 10 MG tablet Take 1 tablet (10 mg total) by mouth as needed for migraine. May repeat in 2 hours if needed   Semaglutide -Weight Management (WEGOVY ) 1 MG/0.5ML SOAJ Inject 1 mg into the skin every 7 (seven) days.   Tiotropium Bromide -Olodaterol 2.5-2.5 MCG/ACT AERS Inhale 2 puffs into the lungs daily.   VENTOLIN  HFA 108 (90 Base) MCG/ACT inhaler INHALE 1 TO 2 PUFFS INTO THE LUNGS EVERY 6 HOURS AS NEEDED FOR WHEEZING OR SHORTNESS OF BREATH   [DISCONTINUED] losartan  (COZAAR ) 25 MG tablet Take 1 tablet (25 mg total) by mouth daily.   [DISCONTINUED] methylPREDNISolone  (MEDROL  DOSEPAK) 4 MG TBPK tablet Take as directed per package instructions     Allergies:   Bee venom, Latex, and Lisinopril   ROS:   Please see the history of present illness. All other systems reviewed and are negative.   Labs/Other Tests and Data Reviewed:    Recent Labs: 12/17/2023: B Natriuretic Peptide 17.5; Magnesium 2.0 01/07/2024: ALT 16; Hemoglobin 16.0; Platelets 332 01/15/2024: BUN 11; Creatinine, Ser  0.77; Potassium 3.8; Sodium 140   Recent Lipid Panel Lab Results  Component Value Date/Time   CHOL 180 12/01/2023 09:22 AM   TRIG 186 (H) 12/01/2023 09:22 AM   HDL 37 (L) 12/01/2023 09:22 AM   CHOLHDL 4.9 (H) 12/01/2023 09:22 AM   CHOLHDL 4.8 06/01/2020 02:18 PM   LDLCALC 110 (H) 12/01/2023 09:22 AM   LDLCALC 140 (H) 06/01/2020 02:18 PM    Wt Readings from Last 3 Encounters:  07/22/24 203 lb (92.1 kg)  06/18/24 197 lb 3.2 oz (89.4 kg)  03/04/24 188 lb 4.4 oz (85.4 kg)     Objective:    Vital Signs:  There were no vitals taken for this visit.   VITAL SIGNS:  reviewed GEN:  no acute distress EYES:  sclerae anicteric, EOMI - Extraocular Movements Intact RESPIRATORY:  normal respiratory effort, symmetric expansion NEURO:  alert and oriented x 3, no obvious focal deficit PSYCH:  normal affect  ASSESSMENT & PLAN:    Essential hypertension BP Readings from Last 1 Encounters:  07/22/24 116/67   Well-controlled with amlodipine  10 mg QD and Losartan  25 mg QD Considering episode of postural dizziness, will discontinue losartan  for now Counseled for compliance with the medications Advised DASH diet and moderate exercise/walking, at least 150 mins/week  Postural dizziness with presyncope Her episode of dizziness with nausea and warmth sensation likely due to presyncope, in the setting of hypotension/dehydration Discontinue losartan  - has recurrent symptoms since starting losartan  Advised to maintain at least 64 ounces of fluid intake in a day and eat at regular intervals Will check orthostatic vitals during office visit if she has recurrent symptoms    I discussed the assessment and treatment plan with the patient. The patient was provided an opportunity to ask questions, and all were answered. The patient agreed with the plan and demonstrated an understanding of the instructions.   The patient was advised to call back or seek an in-person evaluation if the symptoms worsen or  if the condition fails to improve as anticipated.  The above assessment and management plan was discussed with the patient. The patient verbalized understanding of and has agreed to the management plan.   Medication Adjustments/Labs and Tests Ordered: Current medicines are reviewed at length with the patient today.  Concerns regarding medicines are outlined above.   Tests Ordered: No orders of the defined types were placed in this encounter.   Medication Changes: No orders of the defined types were placed in this encounter.    Note: This dictation was prepared with Dragon dictation along with smaller phrase technology. Similar sounding words can be transcribed inadequately or may not be corrected upon review. Any transcriptional errors that result from this process are unintentional.      Disposition:  Follow up  Signed, Suzzane MARLA Blanch, MD  08/13/2024 6:53 PM     [facility]

## 2024-08-13 NOTE — Telephone Encounter (Signed)
 Pt added as a video would like to discuss her sx with you, states she gets off of work after 12

## 2024-08-13 NOTE — Assessment & Plan Note (Signed)
 Her episode of dizziness with nausea and warmth sensation likely due to presyncope, in the setting of hypotension/dehydration Discontinue losartan  - has recurrent symptoms since starting losartan  Advised to maintain at least 64 ounces of fluid intake in a day and eat at regular intervals Will check orthostatic vitals during office visit if she has recurrent symptoms

## 2024-08-13 NOTE — Assessment & Plan Note (Signed)
 BP Readings from Last 1 Encounters:  07/22/24 116/67   Well-controlled with amlodipine  10 mg QD and Losartan  25 mg QD Considering episode of postural dizziness, will discontinue losartan  for now Counseled for compliance with the medications Advised DASH diet and moderate exercise/walking, at least 150 mins/week

## 2024-08-18 ENCOUNTER — Ambulatory Visit: Payer: Self-pay

## 2024-08-18 NOTE — Telephone Encounter (Signed)
 1st attempt, no answer. Left voicemail for patient to return call to nurse triage.  Copied from CRM [id number]. Topic: Clinical - Medication Question >> Aug 18, 2024  2:26 PM Tysheama G wrote: Reason for CRM: Patient stated Dr.Patel took her the medication Losartan  Potassium 25 mg and once she stopped taking it, she started feeling faint and having bad headaches like before so she wants to get back on it or either something for Hypertension ASAP. Callback number [phone]

## 2024-08-18 NOTE — Telephone Encounter (Signed)
 FYI Only or Action Required?: pt wants .request changing medication to old BP medication losartan  potassium 25 mg  Patient was last seen in primary care on 08/13/2024 by [name] [name], MD.  Called Nurse Triage reporting Advice Only.  Symptoms began a week ago.  Interventions attempted: Nothing.  Symptoms are: unchanged.  Triage Disposition: Home Care  Patient/caregiver understands and will follow disposition?: No, wishes to speak with PCP        Reason for Disposition  [1] Systolic BP < 130 with Diastolic < 80 AND [2] taking BP medications    Will route encounter to PCP: educated pt on reducing dose: not approved by PCP therefore I can recommend she do that.  Answer Assessment - Initial Assessment Questions 1. BLOOD PRESSURE: What is your blood pressure? Did you take at least two measurements 5 minutes apart?     Pt does not take BP: pt stated she knows when her BP is high due to red face, dizziness, headaches 2. ONSET: When did you take your blood pressure?     X week 3. HOW: How did you take your blood pressure? (e.g., automatic home BP monitor, visiting nurse)     Does not take BP 4. HISTORY: Do you have a history of high blood pressure?     unsure 5. MEDICINES: Are you taking any medicines for blood pressure? Have you missed any doses recently?     no 6. OTHER SYMPTOMS: Do you have any symptoms? (e.g., blurred vision, chest pain, difficulty breathing, headache, weakness)     Blurred vision, chest pain the other day: none today, nausea, light 7. PREGNANCY: Is there any chance you are pregnant? When was your last menstrual period?     No  Pt also thinks she is a diabetic and her blood sugars drop as well.  Offered pt an appt to come for re-evaluation by a provider: pt refused stating she is going to take a 1/2 pill tonight and will not take anymore until provider orders another rx.  Informed pt I would be sharing that information with  provider.  Protocols used: Blood Pressure - High-A-AH

## 2024-08-24 ENCOUNTER — Ambulatory Visit: Payer: Self-pay | Admitting: Nurse Practitioner

## 2024-09-14 ENCOUNTER — Ambulatory Visit (INDEPENDENT_AMBULATORY_CARE_PROVIDER_SITE_OTHER): Admitting: Internal Medicine

## 2024-09-14 ENCOUNTER — Encounter: Payer: Self-pay | Admitting: Internal Medicine

## 2024-09-14 VITALS — BP 128/80 | HR 94 | Ht 64.0 in | Wt 200.6 lb

## 2024-09-14 DIAGNOSIS — I10 Essential (primary) hypertension: Secondary | ICD-10-CM | POA: Diagnosis not present

## 2024-09-14 DIAGNOSIS — R42 Dizziness and giddiness: Secondary | ICD-10-CM | POA: Diagnosis not present

## 2024-09-14 DIAGNOSIS — G43719 Chronic migraine without aura, intractable, without status migrainosus: Secondary | ICD-10-CM | POA: Diagnosis not present

## 2024-09-14 DIAGNOSIS — J309 Allergic rhinitis, unspecified: Secondary | ICD-10-CM

## 2024-09-14 MED ORDER — TIRZEPATIDE-WEIGHT MANAGEMENT 2.5 MG/0.5ML ~~LOC~~ SOAJ: 2.5000 mg | SUBCUTANEOUS | 0 refills | Status: DC

## 2024-09-14 MED ORDER — MECLIZINE HCL 25 MG PO TABS
25.0000 mg | ORAL_TABLET | Freq: Three times a day (TID) | ORAL | 0 refills | Status: DC | PRN
Start: 2024-09-14 — End: 2024-10-18

## 2024-09-14 MED ORDER — RIZATRIPTAN BENZOATE 10 MG PO TABS
10.0000 mg | ORAL_TABLET | ORAL | 2 refills | Status: DC | PRN
Start: 2024-09-14 — End: 2024-10-18

## 2024-09-14 MED ORDER — FLUTICASONE PROPIONATE 50 MCG/ACT NA SUSP: 2.0000 | Freq: Every day | NASAL | 0 refills | Status: DC

## 2024-09-14 NOTE — Patient Instructions (Addendum)
 Please take Meclizine as needed for dizziness. Please avoid sudden positional changes. Please maintain at least 64 ounces of fluid intake and eat at regular intervals.  Please start taking Zepbound as prescribed.  Please continue to take medications as prescribed.  Please continue to follow low carb diet and perform moderate exercise/walking at least 150 mins/week.

## 2024-09-14 NOTE — Assessment & Plan Note (Signed)
Well-controlled with PRN Maxalt, refilled If frequent episodes of migraine, will add topiramate - has tried it before

## 2024-09-14 NOTE — Progress Notes (Unsigned)
 Acute Office Visit  Subjective:    Patient ID: Destiny Petty, female    DOB: 09/22/1981, 43 y.o.   MRN: [id number]  Chief Complaint  Patient presents with   Nausea    Pt reports sx of nausea and feeling faint.    Hypertension    Having headaches from blood pressure.    Obesity    Would like to discuss options for wegovy /ozempic  which is better for her for insurance to cover.     HPI Patient is in today for complaint of dizziness and nausea for the last 2 weeks.  She has felt dizzy with head movements recently.  Denies any recent URTI.  Denies ear pain or discharge currently.  Her BP is WNL currently. She is taking amlodipine  10 mg QD currently.  Her losartan  was discontinued due to concern for orthostatic hypotension.  She reports having high BP when she gets anxious.  She was tolerating Wegovy  well, but has lost coverage for it.  She is trying to follow low-carb diet, but is still struggling to maintain her weight.  She is interested in [employer] program through [employer].  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 Scheduled for Cholecystectomy   GERD (gastroesophageal reflux disease)    Hemorrhoids 02/10/2015   Hiatal hernia    Hyperlipidemia 04/09/2011   Hypertension    no meds   Injury of digital nerve of right little finger 08/23/2020   Laceration of blood vessel of  right little finger 08/23/2020   Laceration of finger of left hand without damage to nail    left small finger   Menorrhagia with regular cycle 04/10/2016   Migraine 01/15/2016   Overview:  Overview:  IMO Load 2016 R1.3   Muscle spasm of back 03/13/2016   Numbness and tingling in left hand 08/19/2016   Obesity    Ovarian cyst 12/18/2016   Perianal wart 02/10/2015   Stress 02/14/2016   Weight loss counseling, encounter for 03/13/2016    Past Surgical History:  Procedure Laterality Date   ABLATION     CESAREAN SECTION     CESAREAN SECTION N/A    Phreesia 05/23/2020   CHOLECYSTECTOMY     COLONOSCOPY     COLONOSCOPY WITH PROPOFOL  N/A 03/04/2024   Procedure: COLONOSCOPY WITH PROPOFOL ;  Surgeon: Cinderella Deatrice FALCON, MD;  [facility];  Service: Endoscopy;  Laterality: N/A;  7:30AM;ASA 3   DILATATION AND CURETTAGE/HYSTEROSCOPY WITH MINERVA N/A 08/31/2019   Procedure: DILATATION AND CURETTAGE (no specimen) /HYSTEROSCOPY WITH MINERVA ENDOMETRIAL ABLATION;  Surgeon: Edsel Norleen GAILS, MD;  Location: [facility];  Service: Gynecology;  Laterality: N/A;   ESOPHAGOGASTRODUODENOSCOPY ENDOSCOPY     HEMORRHOID SURGERY N/A 05/10/2022   Procedure: HEMORRHOIDECTOMY, EXTENSIVE;  Surgeon: Mavis Anes, MD;  Location: [facility];  Service: General;  Laterality: N/A;   POLYPECTOMY  03/04/2024  Procedure: POLYPECTOMY;  Surgeon: Cinderella Deatrice FALCON, MD;  Location: [facility];  Service: Endoscopy;;   TENDON EXPLORATION Left 08/10/2020   Procedure: LEFT SMALL FINER EXPLORATION, REPAIR OF TENDON, ARTERY, NERVE;  Surgeon: Murrell Drivers, MD;  Location: [facility];  Service: Orthopedics;  Laterality: Left;   TUBAL LIGATION      Family History  Problem Relation Age of Onset   Diabetes Mother    Kidney failure Mother    Early death Mother 61       diabetes complications   Cancer Father 18       colon cancer   Depression Sister    Hypertension Sister    Anxiety disorder Sister    Other Sister         back problems   ADD / ADHD Daughter    Other Daughter        behavioral issues   ODD Daughter    Asthma Son    Cancer Maternal Grandmother        breast   Seizures Maternal Grandmother    Lupus Other    Breast cancer Maternal Aunt     Social History   Socioeconomic History   Marital status: Legally Separated    Spouse name: Not on file   Number of children: 2   Years of education: 14   Highest education level: Not on file  Occupational History   Not on file  Tobacco Use   Smoking status: Every Day    Current packs/day: 1.00    Average packs/day: 1 pack/day for 26.0 years (26.0 ttl pk-yrs)    Types: Cigarettes    Passive exposure: Never   Smokeless tobacco: Never   Tobacco comments:    1/4 ppd 05/30/21  Vaping Use   Vaping status: Never Used  Substance and Sexual Activity   Alcohol use: Yes    Comment: occ   Drug use: No   Sexual activity: Not Currently    Birth control/protection: Surgical    Comment: tubal  Other Topics Concern   Not on file  Social History Narrative   Legally separated right now   2 children: son 62 and daughter 34 lives with her      2 dogs: macho, mary jane   2 birds:    1 cats: boghgie       Enjoys: spending time with kids, outside things      Diet: eat all food groups    Caffeine: coffee and soda daily   Water : 4 cups daily      Wears seat belt   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   No weapons        Social Drivers of Health   Financial Resource Strain: High Risk (04/20/2024)   Overall Financial Resource Strain (CARDIA)    Difficulty of Paying Living Expenses: Very hard  Food Insecurity: No Food Insecurity (05/28/2024)   Hunger Vital Sign    Worried About Running Out of Food in the Last Year: Never true    Ran Out of Food in the Last Year: Never true  Transportation Needs: No Transportation Needs (05/28/2024)   PRAPARE - Administrator, Civil Service (Medical): No    Lack of  Transportation (Non-Medical): No  Physical Activity: Insufficiently Active (04/20/2024)   Exercise Vital Sign    Days of Exercise per Week: 4 days    Minutes of Exercise per Session: 30 min  Stress: Stress  Concern Present (04/20/2024)   [employer] of Occupational Health - Occupational Stress Questionnaire    Feeling of Stress : Very much  Social Connections: Moderately Isolated (04/20/2024)   Social Connection and Isolation Panel    Frequency of Communication with Friends and Family: More than three times a week    Frequency of Social Gatherings with Friends and Family: More than three times a week    Attends Religious Services: More than 4 times per year    Active Member of [employer] or Organizations: No    Attends Banker Meetings: Never    Marital Status: Separated  Intimate Partner Violence: Not At Risk (05/28/2024)   Humiliation, Afraid, Rape, and Kick questionnaire    Fear of Current or Ex-Partner: No    Emotionally Abused: No    Physically Abused: No    Sexually Abused: No    Outpatient Medications Prior to Visit  Medication Sig Dispense Refill   Albuterol -Budesonide (AIRSUPRA ) 90-80 MCG/ACT AERO Inhale 2 puffs into the lungs every 6 (six) hours as needed (Shortness of breath or wheezing). 33 g 5   amLODipine  (NORVASC ) 10 MG tablet Take 1 tablet (10 mg total) by mouth daily. 90 tablet 3   cetirizine  (ZYRTEC ) 10 MG tablet Take 1 tablet (10 mg total) by mouth daily. 30 tablet 0   HYDROcodone -acetaminophen  (NORCO/VICODIN) 5-325 MG tablet Take 1 tablet by mouth every 6 (six) hours as needed. 6 tablet 0   ondansetron  (ZOFRAN ) 4 MG tablet Take 1 tablet (4 mg total) by mouth every 8 (eight) hours as needed for nausea or vomiting. 20 tablet 0   pantoprazole  (PROTONIX ) 40 MG tablet Take 1 tablet (40 mg total) by mouth daily. 30 tablet 5   Tiotropium Bromide -Olodaterol 2.5-2.5 MCG/ACT AERS Inhale 2 puffs into the lungs daily. 4 g 5   VENTOLIN  HFA 108 (90 Base) MCG/ACT  inhaler INHALE 1 TO 2 PUFFS INTO THE LUNGS EVERY 6 HOURS AS NEEDED FOR WHEEZING OR SHORTNESS OF BREATH 54 g 2   fluticasone  (FLONASE ) 50 MCG/ACT nasal spray Place 2 sprays into both nostrils daily. 16 g 0   rizatriptan  (MAXALT ) 10 MG tablet Take 1 tablet (10 mg total) by mouth as needed for migraine. May repeat in 2 hours if needed 10 tablet 2   Semaglutide -Weight Management (WEGOVY ) 1 MG/0.5ML SOAJ Inject 1 mg into the skin every 7 (seven) days. 2 mL 2   No facility-administered medications prior to visit.    Allergies  Allergen Reactions   Bee Venom Shortness Of Breath   Latex Itching   Lisinopril Cough    Review of Systems  Constitutional:  Positive for fatigue. Negative for chills and fever.  HENT:  Positive for congestion. Negative for sore throat.   Eyes:  Negative for pain and discharge.  Respiratory:  Negative for cough and shortness of breath.   Cardiovascular:  Negative for chest pain and palpitations.  Gastrointestinal:  Positive for nausea. Negative for abdominal pain, diarrhea and vomiting.  Endocrine: Negative for polydipsia and polyuria.  Genitourinary:  Negative for dysuria and hematuria.  Musculoskeletal:  Negative for neck pain and neck stiffness.  Skin:  Negative for rash.  Neurological:  Positive for dizziness and headaches. Negative for weakness.  Psychiatric/Behavioral:  Negative for agitation and behavioral problems. The patient is nervous/anxious.        Objective:    Physical Exam Vitals reviewed.  Constitutional:      General: She is not in acute distress.    Appearance: She  is obese. She is not diaphoretic.  HENT:     Head: Normocephalic and atraumatic.     Nose: Congestion present.     Mouth/Throat:     Mouth: Mucous membranes are moist.     Pharynx: No posterior oropharyngeal erythema.  Eyes:     General: No scleral icterus.    Extraocular Movements: Extraocular movements intact.  Cardiovascular:     Rate and Rhythm: Normal rate and regular  rhythm.     Heart sounds: Normal heart sounds. No murmur heard. Pulmonary:     Breath sounds: Normal breath sounds. No wheezing or rales.  Musculoskeletal:     Cervical back: Neck supple. No tenderness.     Right lower leg: No edema.     Left lower leg: No edema.  Skin:    General: Skin is warm.     Findings: No rash.  Neurological:     General: No focal deficit present.     Mental Status: She is alert and oriented to person, place, and time.     Sensory: No sensory deficit.     Motor: No weakness.  Psychiatric:        Mood and Affect: Mood normal.        Behavior: Behavior normal.     BP 128/80 (BP Location: Left Arm)   Pulse 94   Ht 5' 4 (1.626 m)   Wt 200 lb 9.6 oz (91 kg)   SpO2 94%   BMI 34.43 kg/m  Wt Readings from Last 3 Encounters:  09/14/24 200 lb 9.6 oz (91 kg)  07/22/24 203 lb (92.1 kg)  06/18/24 197 lb 3.2 oz (89.4 kg)        Assessment & Plan:   Problem List Items Addressed This Visit       Cardiovascular and Mediastinum   Essential hypertension - Primary   BP Readings from Last 1 Encounters:  09/14/24 128/80   Well-controlled with amlodipine  10 mg QD Considering episode of postural dizziness, had discontinued losartan  for now Relaxation techniques/breathing exercises to reduce stress/anxiety Counseled for compliance with the medications Advised DASH diet and moderate exercise/walking, at least 150 mins/week      Migraine   Well-controlled with PRN Maxalt , refilled If frequent episodes of migraine, will add topiramate  - has tried it before      Relevant Medications   rizatriptan  (MAXALT ) 10 MG tablet     Respiratory   Allergic sinusitis   Mild chronic nasal congestion likely due to allergic sinusitis Continue Zyrtec  Prescribed Flonase  for nasal congestion/allergies      Relevant Medications   fluticasone  (FLONASE ) 50 MCG/ACT nasal spray     Other   Morbid obesity due to excess calories (HCC) (Chronic)   BMI Readings from Last 3  Encounters:  09/14/24 34.43 kg/m  07/22/24 34.84 kg/m  06/18/24 38.51 kg/m   Has tried low-carb diet and exercise regimen Has been on phentermine  - lost about 12 lbs in the past She is a good candidate for GLP-1 agonist Has lost coverage for Wegovy  -initial BMI: 40.66, had lost 21 lbs in the past, appeal was denied as well She is interested in Zepbound through [employer], prescription sent to [facility], plan to increase dose as tolerated - but she prefers to stay on 2.5 mg dose for now Had advised to think about bariatric surgery referral in the past   Advised to follow-up low-carb diet, do portion control and guided to use other resources to adhere to  the diet such as apps (Noom, healthi)      Relevant Medications   tirzepatide (ZEPBOUND) 2.5 MG/0.5ML injection vial   Vertigo   Recent episode of dizziness with head movements and nausea likely due to vertigo Meclizine as needed for dizziness Avoid sudden positional changes Simple vestibular exercises advised      Relevant Medications   meclizine (ANTIVERT) 25 MG tablet     Meds ordered this encounter  Medications   DISCONTD: tirzepatide (ZEPBOUND) 2.5 MG/0.5ML Pen    Sig: Inject 2.5 mg into the skin once a week.    Dispense:  2 mL    Refill:  0   meclizine (ANTIVERT) 25 MG tablet    Sig: Take 1 tablet (25 mg total) by mouth 3 (three) times daily as needed for dizziness.    Dispense:  30 tablet    Refill:  0   rizatriptan  (MAXALT ) 10 MG tablet    Sig: Take 1 tablet (10 mg total) by mouth as needed for migraine. May repeat in 2 hours if needed    Dispense:  10 tablet    Refill:  2   fluticasone  (FLONASE ) 50 MCG/ACT nasal spray    Sig: Place 2 sprays into both nostrils daily.    Dispense:  16 g    Refill:  0   tirzepatide (ZEPBOUND) 2.5 MG/0.5ML injection vial    Sig: Inject 2.5 mg into the skin once a week.    Dispense:  2 mL    Refill:  2     Tyshawn Keel MARLA Blanch, MD

## 2024-09-14 NOTE — Assessment & Plan Note (Signed)
 BP Readings from Last 1 Encounters:  09/14/24 128/80   Well-controlled with amlodipine  10 mg QD Considering episode of postural dizziness, had discontinued losartan  for now Relaxation techniques/breathing exercises to reduce stress/anxiety Counseled for compliance with the medications Advised DASH diet and moderate exercise/walking, at least 150 mins/week

## 2024-09-15 ENCOUNTER — Telehealth: Payer: Self-pay | Admitting: Pharmacy Technician

## 2024-09-15 ENCOUNTER — Other Ambulatory Visit (HOSPITAL_COMMUNITY): Payer: Self-pay

## 2024-09-15 MED ORDER — TIRZEPATIDE-WEIGHT MANAGEMENT 2.5 MG/0.5ML ~~LOC~~ SOLN: 2.5000 mg | SUBCUTANEOUS | 2 refills | Status: DC

## 2024-09-15 NOTE — Telephone Encounter (Signed)
 Pharmacy Patient Advocate Encounter   Received notification from CoverMyMeds that prior authorization for Zepbound 2.5MG /0.5ML pen-injectors is required/requested.   Insurance verification completed.   The patient is insured through [facility].   Per test claim: Weight loss dugs are not covered by her plan.

## 2024-09-15 NOTE — Telephone Encounter (Signed)
 Spoke with pt to let her know, verbalized understanding.

## 2024-09-16 NOTE — Assessment & Plan Note (Signed)
 BMI Readings from Last 3 Encounters:  09/14/24 34.43 kg/m  07/22/24 34.84 kg/m  06/18/24 38.51 kg/m   Has tried low-carb diet and exercise regimen Has been on phentermine  - lost about 12 lbs in the past She is a good candidate for GLP-1 agonist Has lost coverage for Wegovy  -initial BMI: 40.66, had lost 21 lbs in the past, appeal was denied as well She is interested in Zepbound through [employer], prescription sent to [facility], plan to increase dose as tolerated - but she prefers to stay on 2.5 mg dose for now Had advised to think about bariatric surgery referral in the past   Advised to follow-up low-carb diet, do portion control and guided to use other resources to adhere to the diet such as apps (Noom, healthi)

## 2024-09-16 NOTE — Assessment & Plan Note (Signed)
 Recent episode of dizziness with head movements and nausea likely due to vertigo Meclizine as needed for dizziness Avoid sudden positional changes Simple vestibular exercises advised

## 2024-09-16 NOTE — Assessment & Plan Note (Signed)
 Mild chronic nasal congestion likely due to allergic sinusitis Continue Zyrtec  Prescribed Flonase  for nasal congestion/allergies

## 2024-10-01 ENCOUNTER — Telehealth: Payer: Self-pay

## 2024-10-01 NOTE — Patient Outreach (Signed)
 Complex Care Management Care Guide Note  10/01/2024 Name: Destiny Petty MRN: [id number] DOB: 04/05/1981  Destiny Petty is a 43 y.o. year old female who is a primary care patient of [name] [name], MD and is actively engaged with the care management team. I reached out to Destiny Petty by phone today to assist with re-scheduling  with the RN Case Manager.  Follow up plan: Telephone appointment with complex care management team member scheduled for:  10/07/24  Shereen Gin [facility]  Population Health VBCI Assistant Direct Dial: [phone]  Fax: [phone] Website: [email or web address]

## 2024-10-07 ENCOUNTER — Other Ambulatory Visit: Payer: Self-pay

## 2024-10-07 NOTE — Patient Instructions (Signed)
 Visit Information  Ms. Ramirez-Garcia was given information about Medicaid Managed Care team care coordination services as a part of their Healthy [facility] Medicaid benefit. Manreet Kiernan Ramirez-Garcia   If you would like to schedule transportation through your Healthy [facility] plan, please call the following number at least 2 days in advance of your appointment: [phone]  For information about your ride after you set it up, call Ride Assist at [phone]. Use this number to activate a Will Call pickup, or if your transportation is late for a scheduled pickup. Use this number, too, if you need to make a change or cancel a previously scheduled reservation.  If you need transportation services right away, call [phone]. The after-hours call center is staffed 24 hours to handle ride assistance and urgent reservation requests (including discharges) 365 days a year. Urgent trips include sick visits, hospital discharge requests and life-sustaining treatment.  Call the [facility] Line at [phone], at any time, 24 hours a day, 7 days a week. If you are in danger or need immediate medical attention call 911.   Please see education materials related to Hypertension provided by MyChart link.  Care plan and visit instructions communicated with the patient verbally today. Patient agrees to receive a copy in MyChart. Active MyChart status and patient understanding of how to access instructions and care plan via MyChart confirmed with patient.     Telephone follow up appointment with Managed Medicaid care management team member scheduled for:10-19-2024 at 10:00 AM   Hendricks Her RN, BSN  [place] VBCI-Population Health RN Case Manager   Direct [phone]   Following is a copy of your plan of care:  There are no care plans that you recently modified to display for this patient.

## 2024-10-18 ENCOUNTER — Other Ambulatory Visit: Payer: Self-pay | Admitting: Internal Medicine

## 2024-10-18 ENCOUNTER — Telehealth: Payer: Self-pay | Admitting: Pharmacy Technician

## 2024-10-18 ENCOUNTER — Telehealth: Payer: Self-pay | Admitting: Internal Medicine

## 2024-10-18 ENCOUNTER — Other Ambulatory Visit (HOSPITAL_COMMUNITY): Payer: Self-pay

## 2024-10-18 ENCOUNTER — Other Ambulatory Visit: Payer: Self-pay

## 2024-10-18 DIAGNOSIS — G43719 Chronic migraine without aura, intractable, without status migrainosus: Secondary | ICD-10-CM

## 2024-10-18 DIAGNOSIS — K219 Gastro-esophageal reflux disease without esophagitis: Secondary | ICD-10-CM

## 2024-10-18 DIAGNOSIS — R110 Nausea: Secondary | ICD-10-CM

## 2024-10-18 DIAGNOSIS — R42 Dizziness and giddiness: Secondary | ICD-10-CM

## 2024-10-18 DIAGNOSIS — J309 Allergic rhinitis, unspecified: Secondary | ICD-10-CM

## 2024-10-18 DIAGNOSIS — R7989 Other specified abnormal findings of blood chemistry: Secondary | ICD-10-CM | POA: Diagnosis not present

## 2024-10-18 MED ORDER — FLUTICASONE PROPIONATE 50 MCG/ACT NA SUSP: 2.0000 | Freq: Every day | NASAL | 0 refills | Status: AC

## 2024-10-18 MED ORDER — TIRZEPATIDE-WEIGHT MANAGEMENT 2.5 MG/0.5ML ~~LOC~~ SOLN: 2.5000 mg | SUBCUTANEOUS | 0 refills | Status: AC

## 2024-10-18 MED ORDER — ONDANSETRON HCL 4 MG PO TABS: 4.0000 mg | ORAL_TABLET | Freq: Three times a day (TID) | ORAL | 1 refills | Status: AC | PRN

## 2024-10-18 MED ORDER — RIZATRIPTAN BENZOATE 10 MG PO TABS: 10.0000 mg | ORAL_TABLET | ORAL | 2 refills | Status: AC | PRN

## 2024-10-18 MED ORDER — PANTOPRAZOLE SODIUM 40 MG PO TBEC: 40.0000 mg | DELAYED_RELEASE_TABLET | Freq: Every day | ORAL | 5 refills | Status: AC

## 2024-10-18 MED ORDER — MECLIZINE HCL 25 MG PO TABS: 25.0000 mg | ORAL_TABLET | Freq: Three times a day (TID) | ORAL | 2 refills | Status: AC | PRN

## 2024-10-18 MED ORDER — ALBUTEROL SULFATE HFA 108 (90 BASE) MCG/ACT IN AERS: 1.0000 | INHALATION_SPRAY | Freq: Four times a day (QID) | RESPIRATORY_TRACT | 2 refills | Status: AC | PRN

## 2024-10-18 MED ORDER — CETIRIZINE HCL 10 MG PO TABS: 10.0000 mg | ORAL_TABLET | Freq: Every day | ORAL | 0 refills | Status: AC

## 2024-10-18 NOTE — Telephone Encounter (Signed)
 Patient came in 11/10 requesting refills on the following medications  Ventolin  HFA NORVASC  ZYRTEC    FLONASE   ANTIVERT ZOFRAN   PROTONIX  MAXALT  Tiotropium Bromide -Olodaterol  Patient was last seen 09/14/2024

## 2024-10-18 NOTE — Telephone Encounter (Signed)
 Patient advised.

## 2024-10-18 NOTE — Telephone Encounter (Signed)
 error

## 2024-10-18 NOTE — Telephone Encounter (Signed)
 Pharmacy Patient Advocate Encounter   Received notification from Onbase that prior authorization for Rizatriptan  Benzoate 10MG  tablets is required/requested.   Insurance verification completed.   The patient is insured through [facility].   Per test claim: PA required; PA submitted to above mentioned insurance via Latent Key/confirmation #/EOC [id number] Status is pending

## 2024-10-19 ENCOUNTER — Other Ambulatory Visit: Payer: Self-pay | Admitting: Internal Medicine

## 2024-10-19 ENCOUNTER — Telehealth: Payer: Self-pay

## 2024-10-19 ENCOUNTER — Other Ambulatory Visit: Payer: Self-pay

## 2024-10-19 ENCOUNTER — Ambulatory Visit: Payer: Self-pay | Admitting: Internal Medicine

## 2024-10-19 DIAGNOSIS — R7989 Other specified abnormal findings of blood chemistry: Secondary | ICD-10-CM

## 2024-10-19 LAB — LIPID PANEL
Chol/HDL Ratio: 4.3 ratio (ref 0.0–4.4)
Cholesterol, Total: 165 mg/dL (ref 100–199)
HDL: 38 mg/dL — ABNORMAL LOW (ref >39)
LDL Chol Calc (NIH): 85 mg/dL (ref 0–99)
Triglycerides: 250 mg/dL — ABNORMAL HIGH (ref 0–149)
VLDL Cholesterol Cal: 42 mg/dL — ABNORMAL HIGH (ref 5–40)

## 2024-10-19 LAB — CMP14+EGFR
ALT: 14 IU/L (ref 0–32)
AST: 18 IU/L (ref 0–40)
Albumin: 4.1 g/dL (ref 3.9–4.9)
Alkaline Phosphatase: 136 IU/L — ABNORMAL HIGH (ref 41–116)
BUN/Creatinine Ratio: 21 (ref 9–23)
BUN: 15 mg/dL (ref 6–24)
Bilirubin Total: <0.2 mg/dL (ref 0.0–1.2)
CO2: 19 mmol/L — ABNORMAL LOW (ref 20–29)
Calcium: 9.1 mg/dL (ref 8.7–10.2)
Chloride: 105 mmol/L (ref 96–106)
Creatinine, Ser: 0.73 mg/dL (ref 0.57–1.00)
Globulin, Total: 2.2 g/dL (ref 1.5–4.5)
Glucose: 73 mg/dL (ref 70–99)
Potassium: 4.2 mmol/L (ref 3.5–5.2)
Sodium: 139 mmol/L (ref 134–144)
Total Protein: 6.3 g/dL (ref 6.0–8.5)
eGFR: 103 mL/min/1.73 (ref >59)

## 2024-10-19 LAB — CBC WITH DIFFERENTIAL/PLATELET
Basophils Absolute: 0.1 x10E3/uL (ref 0.0–0.2)
Basos: 1 %
EOS (ABSOLUTE): 0.4 x10E3/uL (ref 0.0–0.4)
Eos: 5 %
Hematocrit: 43.1 % (ref 34.0–46.6)
Hemoglobin: 14.4 g/dL (ref 11.1–15.9)
Immature Grans (Abs): 0 x10E3/uL (ref 0.0–0.1)
Immature Granulocytes: 0 %
Lymphocytes Absolute: 2.4 x10E3/uL (ref 0.7–3.1)
Lymphs: 34 %
MCH: 33.2 pg — ABNORMAL HIGH (ref 26.6–33.0)
MCHC: 33.4 g/dL (ref 31.5–35.7)
MCV: 99 fL — ABNORMAL HIGH (ref 79–97)
Monocytes Absolute: 0.8 x10E3/uL (ref 0.1–0.9)
Monocytes: 12 %
Neutrophils Absolute: 3.4 x10E3/uL (ref 1.4–7.0)
Neutrophils: 48 %
Platelets: 279 x10E3/uL (ref 150–450)
RBC: 4.34 x10E6/uL (ref 3.77–5.28)
RDW: 11.9 % (ref 11.7–15.4)
WBC: 7 x10E3/uL (ref 3.4–10.8)

## 2024-10-19 LAB — HEMOGLOBIN A1C
Est. average glucose Bld gHb Est-mCnc: 100 mg/dL
Hgb A1c MFr Bld: 5.1 % (ref 4.8–5.6)

## 2024-10-19 LAB — VITAMIN D 25 HYDROXY (VIT D DEFICIENCY, FRACTURES): Vit D, 25-Hydroxy: 29 ng/mL — ABNORMAL LOW (ref 30.0–100.0)

## 2024-10-19 LAB — TSH: TSH: 5.54 u[IU]/mL — ABNORMAL HIGH (ref 0.450–4.500)

## 2024-10-19 NOTE — Patient Instructions (Signed)
 Visit Information  Destiny Petty was given information about Medicaid Managed Care team care coordination services as a part of their Healthy [facility] Medicaid benefit. Destiny Petty   If you would like to schedule transportation through your Healthy [facility] plan, please call the following number at least 2 days in advance of your appointment: [phone]  For information about your ride after you set it up, call Ride Assist at [phone]. Use this number to activate a Will Call pickup, or if your transportation is late for a scheduled pickup. Use this number, too, if you need to make a change or cancel a previously scheduled reservation.  If you need transportation services right away, call [phone]. The after-hours call center is staffed 24 hours to handle ride assistance and urgent reservation requests (including discharges) 365 days a year. Urgent trips include sick visits, hospital discharge requests and life-sustaining treatment.  Call the [facility] Line at [phone], at any time, 24 hours a day, 7 days a week. If you are in danger or need immediate medical attention call 911.   Please see education materials related to hypertension provided by MyChart link.  Care plan and visit instructions communicated with the patient verbally today. Patient agrees to receive a copy in MyChart. Active MyChart status and patient understanding of how to access instructions and care plan via MyChart confirmed with patient.     Telephone follow up appointment with Managed Medicaid care management team member scheduled for:11-19-2024 at 1:00 PM   Hendricks Her RN, BSN  [place] VBCI-Population Health RN Case Manager   Direct [phone]    Following is a copy of your plan of care:  There are no care plans that you recently modified to display for this patient.

## 2024-10-19 NOTE — Telephone Encounter (Signed)
 Pharmacy Patient Advocate Encounter  Received notification from [facility] that Prior Authorization for Rizatriptan  Benzoate 10MG  tablets has been CANCELLED due to     PA #/Case ID/Reference #: [id number]

## 2024-10-22 ENCOUNTER — Other Ambulatory Visit: Payer: Self-pay | Admitting: Internal Medicine

## 2024-10-22 DIAGNOSIS — E039 Hypothyroidism, unspecified: Secondary | ICD-10-CM | POA: Insufficient documentation

## 2024-10-22 LAB — SPECIMEN STATUS REPORT

## 2024-10-22 LAB — T4, FREE: Free T4: 0.76 ng/dL — AB (ref 0.82–1.77)

## 2024-10-22 MED ORDER — LEVOTHYROXINE SODIUM 25 MCG PO TABS: 25.0000 ug | ORAL_TABLET | Freq: Every day | ORAL | 1 refills | Status: AC

## 2024-10-27 ENCOUNTER — Other Ambulatory Visit: Payer: Self-pay | Admitting: Licensed Clinical Social Worker

## 2024-10-27 NOTE — Patient Outreach (Signed)
 Complex Care Management   Visit Note  10/27/2024  Name:  Destiny Petty MRN: [id number] DOB: 07-09-1981  Situation: Referral received for Complex Care Management related to Mental/Behavioral Health diagnosis anxiety and depression. I obtained verbal consent from Patient.  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 Scheduled for Cholecystectomy   GERD (gastroesophageal reflux disease)    Hemorrhoids 02/10/2015   Hiatal hernia    Hyperlipidemia 04/09/2011   Hypertension    no meds   Injury of digital nerve of right little finger 08/23/2020   Laceration of blood vessel of right little finger 08/23/2020   Laceration of finger of left hand without damage to nail    left small finger   Menorrhagia with regular cycle 04/10/2016   Migraine 01/15/2016   Overview:  Overview:  IMO Load 2016 R1.3   Muscle spasm of back 03/13/2016   Numbness and tingling in left hand 08/19/2016   Obesity    Ovarian cyst 12/18/2016   Perianal wart 02/10/2015   Stress 02/14/2016   Weight loss counseling, encounter for 03/13/2016    Assessment: Patient Reported Symptoms:  Cognitive Cognitive Status: Able to follow simple commands, Alert and oriented to person, place, and time, Normal speech and language skills Cognitive/Intellectual Conditions Management  [RPT]: None reported or documented in medical history or problem list   Health Maintenance Behaviors: Annual physical exam Healing Pattern: Average Health Facilitated by: Stress management, Rest  Neurological Neurological Review of Symptoms: Headaches Neurological Management Strategies: Coping strategies, Routine screening Neurological Self-Management Outcome: 4 (good)  HEENT HEENT Symptoms Reported: Sudden change or loss of vision, Eye dryness, Sore throat, Other: (Pt reports coming down with some type of flu/cold virus) HEENT Management Strategies: Routine screening, Coping strategies HEENT Self-Management Outcome: 4 (good)    Cardiovascular Cardiovascular Symptoms Reported: Swelling in legs or feet Does patient have uncontrolled Hypertension?: Yes Is patient checking Blood Pressure at home?: Yes Cardiovascular Management Strategies: Medication therapy, Routine screening Cardiovascular Self-Management Outcome: 4 (good)  Respiratory Respiratory Symptoms Reported: Wheezing, Dry cough, Productive cough Additional Respiratory Details: Asthma dx , pt reports currently feeling sick which has affecting her breathing Respiratory Management Strategies: Routine screening, Coping strategies Respiratory Self-Management Outcome: 3 (uncertain)  Endocrine Endocrine Symptoms Reported: Blurry vision, Increased thirst Is patient diabetic?: No Endocrine Self-Management Outcome: 3 (uncertain)  Gastrointestinal Gastrointestinal Symptoms Reported: No symptoms reported      Genitourinary Genitourinary Symptoms Reported: No symptoms reported    Integumentary Integumentary Symptoms Reported: No symptoms reported    Musculoskeletal Musculoskelatal Symptoms Reviewed: Weakness, Back pain Musculoskeletal Management Strategies: Coping strategies, Routine screening Musculoskeletal Self-Management Outcome: 4 (good) Musculoskeletal Comment: Heating pad use Falls in the past year?: No Number of falls in past  year: 1 or less Was there an injury with Fall?: No Fall  Risk Category Calculator: 0 Patient Fall Risk Level: Low Fall Risk    Psychosocial Psychosocial Symptoms Reported: Depression - if selected complete PHQ 2-9 Additional Psychological Details: Pt reports her sadness is related to stress from inability to manage her health care expenses , Managed Medicaid benefits were encouraged to be utilized at this time, find help referral placed today for food support on 10/27/24 and BSW referral placed. No clinical SW needs at this time as patient does NOT want talk therapy or psychiatry services. Behavioral Management Strategies: Coping strategies Behavioral Health Self-Management Outcome: 4 (good) Major Change/Loss/Stressor/Fears (CP): Resources Techniques to [employer] with Loss/Stress/Change: Diversional activities Quality of Family Relationships: involved, helpful Do you feel physically threatened by others?: No   SDOH Interventions    Flowsheet Row Patient Outreach Telephone from 10/27/2024 in [place] POPULATION HEALTH DEPARTMENT Patient Outreach Telephone from 10/19/2024 in [place] POPULATION HEALTH DEPARTMENT Patient Outreach Telephone from 10/07/2024 in [place] POPULATION HEALTH DEPARTMENT Patient Outreach Telephone from 05/28/2024 in [place] POPULATION HEALTH DEPARTMENT Patient Outreach Telephone from 04/30/2024 in [place] POPULATION HEALTH DEPARTMENT Patient Outreach Telephone from 04/20/2024 in [place] POPULATION HEALTH DEPARTMENT  SDOH Interventions        Food Insecurity Interventions [place] Resources Referral Intervention Not Indicated Intervention Not Indicated Intervention Not Indicated Intervention Not Indicated Intervention Not Indicated  Housing Interventions -- Intervention Not Indicated Intervention Not Indicated Intervention Not Indicated Intervention Not Indicated Intervention Not Indicated  Transportation Interventions -- Intervention Not Indicated  Intervention Not Indicated Intervention Not Indicated Intervention Not Indicated Intervention Not Indicated  Utilities Interventions -- Intervention Not Indicated Intervention Not Indicated Intervention Not Indicated Intervention Not Indicated Intervention Not Indicated  Alcohol Usage Interventions -- -- -- -- -- Intervention Not Indicated (Score <7)  Depression Interventions/Treatment  Patient refuses Treatment  [Patient reports that her stress is a result of her financial strain. She denies needing any behavioral health resources or referrals at this time but is agreeable to [facility] referral for [facility] resource connection. Find help referral already placed by RNCM] -- [employer] Referral Counseling -- --  Surveyor, Quantity Strain Interventions -- -- -- -- -- Intervention Not Indicated  Physical Activity Interventions -- -- -- -- -- Intervention Not Indicated  Stress Interventions Patient Declined -- -- -- -- Patient Declined  Social Connections Interventions Intervention Not Indicated -- -- -- -- Intervention Not Indicated  Health Literacy Interventions -- -- -- -- -- Intervention Not Indicated   10/27/2024    PHQ2-9 Depression Screening   Little interest or pleasure in doing things Several days  Feeling down, depressed, or hopeless Several days  PHQ-2 - Total Score 2  Trouble falling or staying asleep, or sleeping too much Several days  Feeling tired or having little energy Several days  Poor appetite or overeating  Several days  Feeling bad about yourself - or that you are a failure or have let yourself or your family down Several days  Trouble concentrating on things, such as reading the newspaper or watching television Several days  Moving or speaking so slowly that other people could have noticed.  Or the opposite - being so fidgety or restless that you have been moving around a lot more than usual Not at all  Thoughts that you would be better off dead, or hurting yourself in some way  Not at all  PHQ2-9 Total Score 7  If you checked off any problems, how difficult have these problems made it for you to do your work,  take care of things at home, or get along with other people    Depression Interventions/Treatment Patient refuses Treatment (Patient reports that her stress is a result of her financial strain. She denies needing any behavioral health resources or referrals at this time but is agreeable to [facility] referral for [facility] resource connection. Find help referral already placed by [facility])    There were no vitals filed for this visit.    Medications Reviewed Today     Reviewed by Merlynn Lyle CROME, LCSW (Social Worker) on 10/27/24 at 1455  Med List Status: <None>   Medication Order Taking? Sig Documenting Provider Last Dose Status Informant  albuterol  (VENTOLIN  HFA) 108 (90 Base) MCG/ACT inhaler [id number]  Inhale 1-2 puffs into the lungs every 6 (six) hours as needed for wheezing or shortness of breath. [name] [name], MD  Active   Albuterol -Budesonide (AIRSUPRA ) 90-80 MCG/ACT TERESE [id number]  Inhale 2 puffs into the lungs every 6 (six) hours as needed (Shortness of breath or wheezing).  Patient not taking: Reported on 10/19/2024   [name] [name], MD  Active   amLODipine  (NORVASC ) 10 MG tablet [id number]  Take 1 tablet (10 mg total) by mouth daily. [name] [name], MD  Active   cetirizine  (ZYRTEC ) 10 MG tablet [id number]  Take 1 tablet (10 mg total) by mouth daily.  Patient not taking: Reported on 10/19/2024   [name] [name], MD  Active   fluticasone  (FLONASE ) 50 MCG/ACT nasal spray [id number]  Place 2 sprays into both nostrils daily.  Patient not taking: Reported on 10/19/2024   [name] [name], MD  Active   HYDROcodone -acetaminophen  (NORCO/VICODIN) 5-325 MG tablet [id number]  Take 1 tablet by mouth every 6 (six) hours as needed.  Patient not taking: Reported on 10/19/2024   Zackowski, Scott, MD  Active   levothyroxine (SYNTHROID) 25 MCG tablet [id number]  Take 1 tablet (25  mcg total) by mouth daily. [name] [name], MD  Active   meclizine (ANTIVERT) 25 MG tablet [id number]  Take 1 tablet (25 mg total) by mouth 3 (three) times daily as needed for dizziness.  Patient not taking: Reported on 10/19/2024   [name] [name], MD  Active   ondansetron  (ZOFRAN ) 4 MG tablet [id number]  Take 1 tablet (4 mg total) by mouth every 8 (eight) hours as needed for nausea or vomiting.  Patient not taking: Reported on 10/19/2024   [name] [name], MD  Active   pantoprazole  (PROTONIX ) 40 MG tablet [id number]  Take 1 tablet (40 mg total) by mouth daily.  Patient not taking: Reported on 10/19/2024   [name] [name], MD  Active   rizatriptan  (MAXALT ) 10 MG tablet [id number]  Take 1 tablet (10 mg total) by mouth as needed for migraine. May repeat in 2 hours if needed  Patient not taking: Reported on 10/19/2024   [name] [name], MD  Active   Tiotropium Bromide -Olodaterol 2.5-2.5 MCG/ACT AERS [id number]  Inhale 2 puffs into the lungs daily.  Patient not taking: Reported on 10/19/2024   Del Orbe Polanco, Iliana, FNP  Active   tirzepatide (ZEPBOUND) 2.5 MG/0.5ML injection vial [id number]  Inject 2.5 mg into the skin once a week.  Patient not taking: Reported on 10/19/2024   [name] [name], MD  Active             Recommendation:   PCP Follow-up Continue Current Plan of Care  Follow Up Plan:   Telephone follow-up in 1 month  Lyle Merlynn, BSW, MSW, LCSW Licensed Clinical  Social Worker [employer]   [facility] [email or web address] Direct Dial: [phone]

## 2024-10-27 NOTE — Patient Instructions (Signed)
 Visit Information  Thank you for taking time to visit with me today. Please don't hesitate to contact me if I can be of assistance to you in the future!  The following coping skill education was provided for stress relief and mental health management: When your car dies or a deadline looms, how do you respond? Long-term, low-grade or acute stress takes a serious toll on your body and mind, so don't ignore feelings of constant tension. Stress is a natural part of life. However, too much stress can harm our health, especially if it continues every day. This is chronic stress and can put you at risk for heart problems like heart disease and depression. Understand what's happening inside your body and learn simple coping skills to combat the negative impacts of everyday stressors.  Types of Stress There are two types of stress: Emotional - types of emotional stress are relationship problems, pressure at work, financial worries, experiencing discrimination or having a major life change. Physical - Examples of physical stress include being sick having pain, not sleeping well, recovery from an injury or having an alcohol and drug use disorder. Fight or Flight Sudden or ongoing stress activates your nervous system and floods your bloodstream with adrenaline and cortisol, two hormones that raise blood pressure, increase heart rate and spike blood sugar. These changes pitch your body into a fight or flight response. That enabled our ancestors to outrun saber-toothed tigers, and it's helpful today for situations like dodging a car accident. But most modern chronic stressors, such as finances or a challenging relationship, keep your body in that heightened state, which hurts your health. Effects of Too Much Stress If constantly under stress, most of us  will eventually start to function less well.  Multiple studies link chronic stress to a higher risk of heart disease, stroke, depression, weight gain, memory loss  and even premature death, so it's important to recognize the warning signals. Talk to your doctor about ways to manage stress if you're experiencing any of these symptoms: Prolonged periods of poor sleep. Regular, severe headaches. Unexplained weight loss or gain. Feelings of isolation, withdrawal or worthlessness. Constant anger and irritability. Loss of interest in activities. Constant worrying or obsessive thinking. Excessive alcohol or drug use. Inability to concentrate.  10 Ways to Cope with Chronic Stress It's key to recognize stressful situations as they occur because it allows you to focus on managing how you react. We all need to know when to close our eyes and take a deep breath when we feel tension rising. Use these tips to prevent or reduce chronic stress. 1. Rebalance Work and Home All work and no play? If you're spending too much time at the office, intentionally put more dates in your calendar to enjoy time for fun, either alone or with others. 2. Get Regular Exercise Moving your body on a regular basis balances the nervous system and increases blood circulation, helping to flush out stress hormones. Even a daily 20-minute walk makes a difference. Any kind of exercise can lower stress and improve your mood ? just pick activities that you enjoy and make it a regular habit. 3. Eat Well and Limit Alcohol and Stimulants Alcohol, nicotine and caffeine may temporarily relieve stress but have negative health impacts and can make stress worse in the long run. Well-nourished bodies cope better, so start with a good breakfast, add more organic fruits and vegetables for a well-balanced diet, avoid processed foods and sugar, try herbal tea and drink more water . 4.  Connect with Supportive People Talking face to face with another person releases hormones that reduce stress. Lean on those good listeners in your life. 5. Carve Out Hobby Time Do you enjoy gardening, reading, listening to music  or some other creative pursuit? Engage in activities that bring you pleasure and joy; research shows that reduces stress by almost half and lowers your heart rate, too. 6. Practice Meditation, Stress Reduction or Yoga Relaxation techniques activate a state of restfulness that counterbalances your body's fight-or-flight hormones. Even if this also means a 10-minute break in a long day: listen to music, read, go for a walk in nature, do a hobby, take a bath or spend time with a friend. Also consider doing a mindfulness exercise or try a daily deep breathing or imagery practice. Deep Breathing Slow, calm and deep breathing can help you relax. Try these steps to focus on your breathing and repeat as needed. Find a comfortable position and close your eyes. Exhale and drop your shoulders. Breathe in through your nose; fill your lungs and then your belly. Think of relaxing your body, quieting your mind and becoming calm and peaceful. Breathe out slowly through your nose, relaxing your belly. Think of releasing tension, pain, worries or distress. Repeat steps three and four until you feel relaxed. Imagery This involves using your mind to excite the senses -- sound, vision, smell, taste and feeling. This may help ease your stress. Begin by getting comfortable and then do some slow breathing. Imagine a place you love being at. It could be somewhere from your childhood, somewhere you vacationed or just a place in your imagination. Feel how it is to be in the place you're imagining. Pay attention to the sounds, air, colors, and who is there with you. This is a place where you feel cared for and loved. All is well. You are safe. Take in all the smells, sounds, tastes and feelings. As you do, feel your body being nourished and healed. Feel the calm that surrounds you. Breathe in all the good. Breathe out any discomfort or tension. 7. Sleep Enough If you get less than seven to eight hours of sleep, your body  won't tolerate stress as well as it could. If stress keeps you up at night, address the cause, and add extra meditation into your day to make up for the lost z's. Try to get seven to nine hours of sleep each night. Make a regular bedtime schedule. Keep your room dark and cool. Try to avoid computers, TV, cell phones and tablets before bed. 8. Bond with Connections You Enjoy Go out for a coffee with a friend, chat with a neighbor, call a family member, visit with a clergy member, or even hang out with your pet. Clinical studies show that spending even a short time with a companion animal can cut anxiety levels almost in half. 9. Take a Vacation Getting away from it all can reset your stress tolerance by increasing your mental and emotional outlook, which makes you a happier, more productive person upon return. Leave your cellphone and laptop at home! 10. See a Counselor, Coach or Therapist If negative thoughts overwhelm your ability to make positive changes, it's time to seek professional help. Make an appointment today--your health and life are worth it.  Please call the Suicide and Crisis Lifeline: 988 call the [place]  National Suicide Prevention Lifeline: 270-209-6346 or TTY: 956-453-8712 TTY (418) 112-5462) to talk to a trained counselor call 1-800-273-TALK (toll free, 24 hour hotline) go to  [facility] [address] [place] [phone]) call the [facility] Line: 952-670-1619 call 911 if you are experiencing a Mental Health or Behavioral Health Crisis or need someone to talk to.  Patient verbalized understanding of Care plan and visit instructions communicated this visit  Lyle Rung, BSW, MSW, LCSW Licensed Clinical Social Worker [employer]   [facility] [email or web address] Direct Dial: [phone]

## 2024-11-01 ENCOUNTER — Other Ambulatory Visit: Payer: Self-pay

## 2024-11-01 NOTE — Patient Outreach (Signed)
 Social Drivers of Health  Community Resource and Care Coordination Visit Note   11/01/2024  Name: Destiny Petty MRN: [id number] DOB:Dec 28, 1980  Situation: Referral received for [facility] needs assessment and assistance related to Financial Strain . I obtained verbal consent from Patient.  Visit completed with Patient on the phone.   Background:   SDOH Interventions Today    Flowsheet Row Most Recent Value  SDOH Interventions   Food Insecurity Interventions Other (Comment), Community Resources Provided  [employer] a list of food banks]  Housing Interventions Other (Comment), Community Resources Provided  [referred to [employer], [place] [employer] [place], [employer] [place], [place] Family charity]  Utilities Interventions [employer] Provided, Other (Comment)  [Provided community resources]  Financial Strain Interventions Other (Comment)  [In debit due to medical issues. Referral to community program for debit]     Assessment:   Goals Addressed             This Visit's Progress    BSW Goals       Current SDOH Barriers:  Financial constraints related to debit Limited access to food  Interventions: Patient interviewed and appropriate screenings performed Referred patient to community resources  Provided patient with information about financial resources to assist with debit consolidation and financial assistance and food bank list. Advised patient to contact community resources to inquire about services to assist with food, large debit, foreclosure, and car repossession. Patient daughter receives $61 and is disabled.  Patient receives $3200 wages but she is 3 months behind on mortgage and car payment.  Bankruptcy company monthly payment would put her in ongoing crisis.            Recommendation:   call and/or follow up with food banks for food assistance Contact community resources for financial resources and debit consolidation.  Follow Up  Plan:   Telephone follow up appointment date/time:  11/15/24 at 3:30pm  Tillman Gardener, BSW [place]  [facility], [facility] Social Worker Direct Dial: [phone]  Fax: [phone] Website: [email or web address]

## 2024-11-01 NOTE — Patient Instructions (Signed)
 Visit Information  Thank you for taking time to visit with me today. Please don't hesitate to contact me if I can be of assistance to you before our next scheduled appointment.  Our next appointment is by telephone on 11/15/24 at 3:30pm Please call the care guide team at [phone] if you need to cancel or reschedule your appointment.   Following is a copy of your care plan:   Goals Addressed             This Visit's Progress    BSW Goals       Current SDOH Barriers:  Financial constraints related to debit Limited access to food  Interventions: Patient interviewed and appropriate screenings performed Referred patient to community resources  Provided patient with information about financial resources to assist with debit consolidation and financial assistance and food bank list. Advised patient to contact community resources to inquire about services to assist with food, large debit, foreclosure, and car repossession. Patient daughter receives $43 and is disabled.  Patient receives $3200 wages but she is 3 months behind on mortgage and car payment.  Bankruptcy company monthly payment would put her in ongoing crisis.            Please call 911 if you are experiencing a Mental Health or Behavioral Health Crisis or need someone to talk to.  Patient verbalized understanding of Care plan and visit instructions communicated this visit  Tillman Gardener, BSW [place]  [facility], [facility] Social Worker Direct Dial: [phone]  Fax: [phone] Website: [email or web address]

## 2024-11-15 ENCOUNTER — Other Ambulatory Visit: Payer: Self-pay

## 2024-11-15 NOTE — Patient Outreach (Signed)
 Social Drivers of Health  Community Resource and Care Coordination Visit Note   11/15/2024  Name: Destiny Petty MRN: [id number] DOB:1981-05-14  Situation: Referral received for [facility] needs assessment and assistance related to Financial Strain  Food Insecurity . I obtained verbal consent from Patient.  Visit completed with Patient on the phone.   Background:   SDOH Interventions Today    Flowsheet Row Most Recent Value  SDOH Interventions   Food Insecurity Interventions Other (Comment)  [Patient was able to receive assistance from food banks]  Housing Interventions Other (Comment)  [Patient was not able to obtain funds for mortgage assistance]  Financial Strain Interventions Other (Comment)  [Patient has appointment 12/10 with [facility] and is working with mortgage company on deferment.]     Assessment:   Goals Addressed             This Visit's Progress    BSW Goals       Current SDOH Barriers:  Financial constraints related to debit Limited access to food  Interventions: Patient interviewed and appropriate screenings performed Referred patient to community resources  Provided patient with information about financial resources to assist with debit consolidation and financial assistance and food bank list. Advised patient to follow up with [facility] for 12/10 appointment and return paperwork.  Also suggest asking CCCS for help with faxing forms to mortgage company for hardship request.           Recommendation:   follow up with CCCS and Mortgage company regarding housing needs  Follow Up Plan:   Telephone follow up appointment date/time:  11/23/24 at 3pm  Tillman Gardener, BSW [place]  [facility], [facility] Social Worker Direct Dial: [phone]  Fax: [phone] Website: [email or web address]

## 2024-11-15 NOTE — Patient Instructions (Signed)
 Visit Information  Thank you for taking time to visit with me today. Please don't hesitate to contact me if I can be of assistance to you before our next scheduled appointment.  Your next care management appointment is by telephone on 11/23/24 at 3pm   Please call the care guide team at [phone] if you need to cancel, schedule, or reschedule an appointment.   Please call 911 if you are experiencing a Mental Health or Behavioral Health Crisis or need someone to talk to.  Tillman Gardener, BSW [place]  [facility], [facility] Social Worker Direct Dial: [phone]  Fax: [phone] Website: [email or web address]

## 2024-11-18 ENCOUNTER — Emergency Department (HOSPITAL_COMMUNITY)

## 2024-11-18 ENCOUNTER — Encounter (HOSPITAL_COMMUNITY): Payer: Self-pay | Admitting: Emergency Medicine

## 2024-11-18 ENCOUNTER — Emergency Department (HOSPITAL_COMMUNITY)
Admission: EM | Admit: 2024-11-18 | Discharge: 2024-11-19 | Disposition: A | Discharge status: Home / Self Care | Attending: Emergency Medicine | Admitting: Emergency Medicine

## 2024-11-18 ENCOUNTER — Other Ambulatory Visit: Payer: Self-pay

## 2024-11-18 DIAGNOSIS — Z91040 Latex allergy status: Secondary | ICD-10-CM | POA: Insufficient documentation

## 2024-11-18 DIAGNOSIS — W208XXA Other cause of strike by thrown, projected or falling object, initial encounter: Secondary | ICD-10-CM | POA: Insufficient documentation

## 2024-11-18 DIAGNOSIS — R300 Dysuria: Secondary | ICD-10-CM | POA: Insufficient documentation

## 2024-11-18 DIAGNOSIS — S99922A Unspecified injury of left foot, initial encounter: Secondary | ICD-10-CM | POA: Diagnosis not present

## 2024-11-18 DIAGNOSIS — N3000 Acute cystitis without hematuria: Secondary | ICD-10-CM

## 2024-11-18 DIAGNOSIS — S9032XA Contusion of left foot, initial encounter: Secondary | ICD-10-CM

## 2024-11-18 NOTE — ED Triage Notes (Signed)
 Pt here with c/o L foot pain after chair fell on it. L foot swollen and pt unable to stand on it. Pt also c/o burning with urination and wants to be checked for a UTI.

## 2024-11-19 ENCOUNTER — Telehealth: Payer: Self-pay

## 2024-11-19 ENCOUNTER — Ambulatory Visit: Admitting: Internal Medicine

## 2024-11-19 LAB — URINALYSIS, ROUTINE W REFLEX MICROSCOPIC
Bacteria, UA: NONE SEEN
Bilirubin Urine: NEGATIVE
Glucose, UA: NEGATIVE mg/dL
Hgb urine dipstick: NEGATIVE
Ketones, ur: NEGATIVE mg/dL
Nitrite: NEGATIVE
Protein, ur: 30 mg/dL — AB
Specific Gravity, Urine: 1.035 — ABNORMAL HIGH (ref 1.005–1.030)
pH: 5 (ref 5.0–8.0)

## 2024-11-19 MED ORDER — HYDROCODONE-ACETAMINOPHEN 5-325 MG PO TABS
2.0000 | ORAL_TABLET | Freq: Once | ORAL | Status: AC
  Administered 2024-11-19: 2 via ORAL
  Filled 2024-11-19: qty 2

## 2024-11-19 MED ORDER — CEPHALEXIN 500 MG PO CAPS
500.0000 mg | ORAL_CAPSULE | Freq: Once | ORAL | Status: AC
  Administered 2024-11-19: 500 mg via ORAL
  Filled 2024-11-19: qty 1

## 2024-11-19 MED ORDER — HYDROCODONE-ACETAMINOPHEN 5-325 MG PO TABS: 1.0000 | ORAL_TABLET | Freq: Four times a day (QID) | ORAL | 0 refills | Status: AC | PRN

## 2024-11-19 MED ORDER — CEPHALEXIN 500 MG PO CAPS: 500.0000 mg | ORAL_CAPSULE | Freq: Three times a day (TID) | ORAL | 0 refills | Status: AC

## 2024-11-19 NOTE — ED Provider Notes (Signed)
 [place] EMERGENCY DEPARTMENT AT [facility] Provider Note   CSN: [id number] Arrival date & time: 11/18/24  2120     Patient presents with: Foot Pain   Destiny Petty is a 43 y.o. female.   Patient is a 43 year old female presenting with complaints of a left foot injury.  A chair was knocked over at her home and landed on her foot.  She complains of severe pain with swelling and inability to bear weight.  Patient also complaining of dysuria and dark urine.  She has had UTIs in the past and this feels similar.  No abdominal pain.  No fevers or chills.       Prior to Admission medications  Medication Sig Start Date End Date Taking? Authorizing Provider  albuterol  (VENTOLIN  HFA) 108 (90 Base) MCG/ACT inhaler Inhale 1-2 puffs into the lungs every 6 (six) hours as needed for wheezing or shortness of breath. 10/18/24   [name] [name], MD  Albuterol -Budesonide (AIRSUPRA ) 90-80 MCG/ACT AERO Inhale 2 puffs into the lungs every 6 (six) hours as needed (Shortness of breath or wheezing). Patient not taking: Reported on 10/19/2024 06/18/24   [name] [name], MD  amLODipine  (NORVASC ) 10 MG tablet Take 1 tablet (10 mg total) by mouth daily. 06/18/24   [name] [name], MD  cetirizine  (ZYRTEC ) 10 MG tablet Take 1 tablet (10 mg total) by mouth daily. Patient not taking: Reported on 10/19/2024 10/18/24   [name] [name], MD  fluticasone  (FLONASE ) 50 MCG/ACT nasal spray Place 2 sprays into both nostrils daily. Patient not taking: Reported on 10/19/2024 10/18/24   [name] [name], MD  HYDROcodone -acetaminophen  (NORCO/VICODIN) 5-325 MG tablet Take 1 tablet by mouth every 6 (six) hours as needed. Patient not taking: Reported on 10/19/2024 01/07/24   Zackowski, Scott, MD  levothyroxine  (SYNTHROID ) 25 MCG tablet Take 1 tablet (25 mcg total) by mouth daily. 10/22/24   [name] [name], MD  meclizine  (ANTIVERT ) 25 MG tablet Take 1 tablet (25 mg total) by mouth 3 (three) times daily as needed  for dizziness. Patient not taking: Reported on 10/19/2024 10/18/24   [name] [name], MD  ondansetron  (ZOFRAN ) 4 MG tablet Take 1 tablet (4 mg total) by mouth every 8 (eight) hours as needed for nausea or vomiting. Patient not taking: Reported on 10/19/2024 10/18/24   [name] [name], MD  pantoprazole  (PROTONIX ) 40 MG tablet Take 1 tablet (40 mg total) by mouth daily. Patient not taking: Reported on 10/19/2024 10/18/24   [name] [name], MD  rizatriptan  (MAXALT ) 10 MG tablet Take 1 tablet (10 mg total) by mouth as needed for migraine. May repeat in 2 hours if needed Patient not taking: Reported on 10/19/2024 10/18/24   [name] [name], MD  Tiotropium Bromide -Olodaterol 2.5-2.5 MCG/ACT AERS Inhale 2 puffs into the lungs daily. Patient not taking: Reported on 10/19/2024 03/21/23   Del Orbe Polanco, Iliana, FNP  tirzepatide  (ZEPBOUND ) 2.5 MG/0.5ML injection vial Inject 2.5 mg into the skin once a week. Patient not taking: Reported on 10/19/2024 10/18/24   [name] [name], MD    Allergies: Bee venom, Latex, and Lisinopril    Review of Systems  All other systems reviewed and are negative.   Updated Vital Signs BP 133/82 (BP Location: Right Arm)   Pulse 98   Temp 98.5 F (36.9 C) (Oral)   Resp 17   Ht 5' 4 (1.626 m)   Wt 90.7 kg   SpO2 98%   BMI 34.33 kg/m   Physical Exam  Vitals and nursing note reviewed.  Constitutional:      Appearance: Normal appearance.  HENT:     Head: Normocephalic.  Pulmonary:     Effort: Pulmonary effort is normal.  Musculoskeletal:     Comments: There is soft tissue swelling noted to the dorsum of the left foot.  There is exquisite tenderness to palpation.  Capillary refill is brisk to all toes and sensation is intact to all toes.  Skin:    General: Skin is warm and dry.  Neurological:     Mental Status: She is alert and oriented to person, place, and time.     (all labs ordered are listed, but only abnormal results are displayed) Labs Reviewed   URINALYSIS, ROUTINE W REFLEX MICROSCOPIC    EKG: None  Radiology: DG Foot Complete Left Result Date: 11/18/2024 EXAM: 3 OR MORE VIEW(S) XRAY OF THE LEFT FOOT 11/18/2024 10:15:00 PM COMPARISON: None available. CLINICAL HISTORY: injury FINDINGS: BONES AND JOINTS: No acute fracture. No malalignment. SOFT TISSUES: Soft tissue swelling along the dorsum of the foot. IMPRESSION: 1. No acute fracture or dislocation. 2. Dorsal soft tissue swelling of the foot. Electronically signed by: Franky Crease MD 11/18/2024 10:24 PM EST RP Workstation: [id number]     Procedures   Medications Ordered in the ED  HYDROcodone -acetaminophen  (NORCO/VICODIN) 5-325 MG per tablet 2 tablet (has no administration in time range)                                    Medical Decision Making Amount and/or Complexity of Data Reviewed Labs: ordered. Radiology: ordered.  Risk Prescription drug management.   Patient presenting here with left foot pain and swelling after a chair fell on her foot this evening.  X-rays are negative for fracture.  Patient to be placed in an Ace bandage and advised to rest, elevate, and ice.  She is also complaining of dysuria and urinalysis is consistent with a UTI.  Keflex  will be prescribed.  To return as needed for any problems.     Final diagnoses:  None    ED Discharge Orders     None          Geroldine Berg, MD 11/19/24 [phone]

## 2024-11-19 NOTE — Patient Instructions (Signed)
 Celest L Ramirez-Garcia - I am sorry I was unable to reach you today for our scheduled appointment. I work with [name], [name], MD and am calling to support your healthcare needs. Please contact me at [phone] at your earliest convenience. I look forward to speaking with you soon.   Thank you,  Hendricks Her RN, BSN  [place] [facility] VBCI-Population Health RN Case Manager   Direct [phone]

## 2024-11-19 NOTE — Discharge Instructions (Addendum)
 Begin taking Keflex  as prescribed.  Take ibuprofen  600 mg every 6 hours as needed for pain.  Take hydrocodone  as prescribed as needed for pain not relieved with ibuprofen .  Ice your foot for 20 minutes every 2 hours while awake for the next 2 days.  Elevate your foot with weightbearing as tolerated.

## 2024-11-23 ENCOUNTER — Telehealth: Payer: Self-pay

## 2024-11-23 ENCOUNTER — Other Ambulatory Visit: Payer: Self-pay

## 2024-11-23 VITALS — Ht 60.0 in | Wt 200.0 lb

## 2024-11-23 DIAGNOSIS — J4540 Moderate persistent asthma, uncomplicated: Secondary | ICD-10-CM

## 2024-11-23 NOTE — Patient Instructions (Signed)
 Visit Information  Destiny Petty was given information about Medicaid Managed Care team care coordination services as a part of their Healthy [facility] Medicaid benefit. Destiny Petty   If you would like to schedule transportation through your Healthy [facility] plan, please call the following number at least 2 days in advance of your appointment: [phone]  For information about your ride after you set it up, call Ride Assist at [phone]. Use this number to activate a Will Call pickup, or if your transportation is late for a scheduled pickup. Use this number, too, if you need to make a change or cancel a previously scheduled reservation.  If you need transportation services right away, call [phone]. The after-hours call center is staffed 24 hours to handle ride assistance and urgent reservation requests (including discharges) 365 days a year. Urgent trips include sick visits, hospital discharge requests and life-sustaining treatment.  Call the [facility] Line at [phone], at any time, 24 hours a day, 7 days a week. If you are in danger or need immediate medical attention call 911.   Please see education materials related to hypertension provided by MyChart link.  Care plan and visit instructions communicated with the patient verbally today. Patient agrees to receive a copy in MyChart. Active MyChart status and patient understanding of how to access instructions and care plan via MyChart confirmed with patient.     Telephone follow up appointment with Managed Medicaid care management team member scheduled for: 12-24-2024 at 9:00 am   Hendricks Her RN, BSN  [place] [facility] VBCI-Population Health RN Case Manager   Direct [phone]   Following is a copy of your plan of care:   Goals Addressed             This Visit's Progress    VBCI RN Care Plan: HTN       Problems:  Chronic Disease Management support and education needs related to  HTN  Goal: Over the next 90 days the Patient will attend all scheduled medical appointments: with primary care provider and specialist as evidenced by keeping all scheduled appointments        continue to work with RN Care Manager and/or Social Worker to address care management and care coordination needs related to HTN as evidenced by adherence to care management team scheduled appointments     demonstrate ongoing self health care management ability of hypertension as evidenced by 3 X weekly blood pressure readings and keeping a log, taking medications as prescribed and watching sodium intake   Update 11/23/2024  Patient did not have log with her today at work    demonstrate understanding of rationale for each prescribed medication as evidenced by explaining in own words the importance of each medication and how they help     take all medications exactly as prescribed and will call provider for medication related questions as evidenced by compliance with all medications  Update 10-19-2024 Patient is still not able to afford medications   verbalize basic understanding of HTN disease process and self health management plan as evidenced by verbal explanation, recognizing/monitoring symptoms, lifestyle modifications Patient will get all ordered lab work completed ( ordered 06-18-2024) A1C is included in this order  UPDATE 10-19-2024 Labs Completed A1C 5.1  Interventions:   Hypertension Interventions: Last practice recorded BP readings:  BP Readings from Last 3 Encounters:  09/14/24 128/80  07/22/24 116/67  06/27/24 138/84   Most recent eGFR/CrCl:  Lab Results  Component Value Date  EGFR 97 01/15/2024    No components found for: CRCL  Evaluation of current treatment plan related to hypertension self management and patient's adherence to plan as established by provider. Reports that she has been reguarly checking her BP with a few noted high readings. She states that she has been stressed at  her job recently which she states is the cause for the elevated readings. Provided education to patient re: stroke prevention, s/s of heart attack and stroke. Education and support provided Reviewed medications with patient and discussed importance of compliance. Reports compliance with the medications UPDATE 10-07-2024 Patient reports not having all medications filled due to financial issues Update 10-19-2024 Counseled on adverse effects of illicit drug and excessive alcohol use in patients with high blood pressure. Denies illicit drug and excessive alcohol use. Counseled on the importance of exercise goals with target of 150 minutes per week. Discussed plans with patient for ongoing care management follow up and provided patient with direct contact information for care management team. Patient requesting to switch from Wegovy  to Ozempic  due to side effects. RNCM will place PharmD referral to see if they can be of assistance. UPDATE 10-07-2024  Insurance has denied coverage for these weight loss meds. Patient unable to obtain  Advised patient, providing education and rationale, to monitor blood pressure daily and record, calling PCP for findings outside established parameters. RNCM reviewed with patient goal SBP<140 DBP<90.UPDATE 10-07-2024: Patient admits to not monitoring BP on a daily basis but will begin to do so Update  10-19-2024 Still not monitoring BP. Update 11-23-2024  : Patient states she is now monitoring BP daily but did not have log with her at work today  Reviewed scheduled/upcoming provider appointments including:11-19-2024 at 8:40 AM  with PCP Advised patient to discuss acute changes/elevated home BP readings with provider Provided education on prescribed diet DASH.  Discussed complications of poorly controlled blood pressure such as heart disease, stroke, circulatory complications, vision complications, kidney impairment, sexual dysfunction Screening for signs and symptoms of depression  related to chronic disease state  Assessed social determinant of health barriers Update 10-07-2024 : Referral Made to LCSW for SDOH needs and anxiety and depression  Update 10-07-2024 FindHelp referrals sent for food and rx resources UPDATE 10-19-2024 patient states she never received texts for Find Help referrals  New referrals sent  Update 11-23-2024  Patient states she is able to go to food pantries  11-23-2024  Referral made to Pharmacy for medication assistance- More affordable alteratives  Patient Self-Care Activities:  Attend all scheduled provider appointments Call pharmacy for medication refills 3-7 days in advance of running out of medications Call provider office for new concerns or questions  Perform all self care activities independently  Take medications as prescribed   Work with the pharmacist to address medication management needs and will continue to work with the clinical team to address health care and disease management related needs check blood pressure daily write blood pressure results in a log or diary take blood pressure log to all doctor appointments call doctor for signs and symptoms of high blood pressure keep all doctor appointments take medications for blood pressure exactly as prescribed begin an exercise program report new symptoms to your doctor eat more whole grains, fruits and vegetables, lean meats and healthy fats  Plan:  Telephone follow up appointment with care management team member scheduled for:  12-24-2024 at 9:00 AM

## 2024-11-23 NOTE — Patient Outreach (Signed)
 Complex Care Management   Visit Note  11/23/2024  Name:  Destiny Petty MRN: [id number] DOB: Feb 19, 1981  Situation: Referral received for Complex Care Management related to hypertension I obtained verbal consent from Patient.  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 Scheduled for Cholecystectomy   GERD (gastroesophageal reflux disease)    Hemorrhoids 02/10/2015   Hiatal hernia    Hyperlipidemia 04/09/2011   Hypertension    no meds   Injury of digital nerve of right little finger 08/23/2020   Laceration of blood vessel of right little finger 08/23/2020   Laceration of finger of left hand without damage to nail    left small finger   Menorrhagia with regular cycle 04/10/2016   Migraine 01/15/2016   Overview:  Overview:  IMO Load 2016 R1.3   Muscle spasm of back 03/13/2016   Numbness and tingling in left hand 08/19/2016   Obesity    Ovarian cyst 12/18/2016   Perianal wart 02/10/2015   Stress 02/14/2016   Weight loss counseling, encounter for 03/13/2016    Assessment: Patient Reported Symptoms:  Cognitive Cognitive Status: Normal speech and language skills, No symptoms reported Cognitive/Intellectual Conditions Management [RPT]: None reported or documented in medical history or problem list   Health Maintenance Behaviors:  Annual physical exam Healing Pattern: Average Health Facilitated by: Stress management  Neurological Neurological Review of Symptoms: Headaches, Vision changes (Patient reports due to needing glasses) Neurological Management Strategies: Coping strategies, Routine screening Neurological Self-Management Outcome: 3 (uncertain)  HEENT HEENT Symptoms Reported: Sudden change or loss of vision (post nasal drip) HEENT Management Strategies: Coping strategies HEENT Self-Management Outcome: 3 (uncertain)    Cardiovascular Cardiovascular Symptoms Reported: Swelling in legs or feet (left leg swelling due to fractured left foot) Does patient have uncontrolled Hypertension?: Yes Is patient checking Blood Pressure at home?: Yes Patient's Recent BP reading at home: Pateint did not have log with her today at work Cardiovascular Management Strategies: Coping strategies, Routine screening Weight: 200 lb (90.7 kg) Cardiovascular Self-Management Outcome: 3 (uncertain)  Respiratory Respiratory Symptoms Reported: Shortness of breath, Wheezing Other Respiratory Symptoms: here and there Additional Respiratory Details: Asthma Dx   No inhalers D/T cost Respiratory Management Strategies: Coping strategies Respiratory Self-Management Outcome: 3 (uncertain)  Endocrine Endocrine Symptoms Reported: Increased thirst Is patient diabetic?: No Endocrine Self-Management Outcome: 4 (good) Endocrine Comment: A1 C 10/2024  Gastrointestinal Gastrointestinal Symptoms Reported: Diarrhea Additional Gastrointestinal Details: Pateint staes due to nerves and being stressed out Gastrointestinal Management Strategies: Coping strategies Gastrointestinal Self-Management Outcome: 4 (good)    Genitourinary Genitourinary Symptoms Reported: No symptoms reported Additional Genitourinary Details: encouraged increased water  intake  Now at 2-3 glasses a day Genitourinary Management Strategies: Coping strategies Genitourinary  Self-Management Outcome: 4 (good) Genitourinary Comment: Recent UTI a week ago  Integumentary Integumentary  Symptoms Reported: Bruising Additional Integumentary Details: Left fractured foot Skin Management Strategies: Coping strategies, Routine screening Skin Self-Management Outcome: 4 (good)  Musculoskeletal Musculoskelatal Symptoms Reviewed: Unsteady gait, Difficulty walking Additional Musculoskeletal Details: Left fractured foot and knee pain Musculoskeletal Management Strategies: Coping strategies, Routine screening Musculoskeletal Self-Management Outcome: 4 (good) Falls in the past year?: No Number of falls in past year: 1 or less Was there an injury with Fall?: No Fall Risk Category Calculator: 0 Patient Fall Risk Level: Low Fall Risk Patient at Risk for Falls Due to: No Fall Risks Fall risk Follow up: Falls evaluation completed, Education provided, Falls prevention discussed  Psychosocial Psychosocial Symptoms Reported: Anxiety - if selected complete GAD, Depression - if selected complete PHQ 2-9, Sadness - if selected complete PHQ 2-9 Behavioral Management Strategies: Coping strategies Behavioral Health Self-Management Outcome: 3 (uncertain) Major Change/Loss/Stressor/Fears (CP): Medical condition, self, Resources, Legal concerns Behaviors When Feeling Stressed/Fearful: staying focused Techniques to Cope with Loss/Stress/Change: Diversional activities Quality of Family Relationships: involved, helpful Do you feel physically threatened by others?: No    11/23/2024    PHQ2-9 Depression Screening   Little interest or pleasure in doing things Several days  Feeling down, depressed, or hopeless Several days  PHQ-2 - Total Score 2  Trouble falling or staying asleep, or sleeping too much Several days  Feeling tired or having little energy Several days  Poor appetite or overeating  Several days  Feeling bad about yourself - or that you are a failure or have let yourself or your  family down Several days  Trouble concentrating on things, such as reading the newspaper or watching television Several days  Moving or speaking so slowly that other people could have noticed.  Or the opposite - being so fidgety or restless that you have been moving around a lot more than usual Not at all  Thoughts that you would be better off dead, or hurting yourself in some way Not at all  PHQ2-9 Total Score 7  If you checked off any problems, how difficult have these problems made it for you to do your work, take care of things at home, or get along with other people Very difficult  Depression Interventions/Treatment  (Patient continues to refuse counseling  Ok waiting to follow up with BSW)    Today's Vitals   11/23/24 1016  Weight: 200 lb (90.7 kg)  Height: 5' (1.524 m)   Pain Scale: 0-10 Pain Score: 6  Pain Type: Other (Comment) (Fractured Left foot) Pain Location: Foot Pain Orientation: Left Pain Descriptors / Indicators: Throbbing Pain Onset: On-going Patients Stated Pain Goal: 0 Pain Intervention(s): Medication (See eMAR), Elevated extremity Multiple Pain Sites: Yes  Medications Reviewed Today     Reviewed by Kay Hendricks MATSU, RN (Case Manager) on 11/23/24 at 1009  Med List Status: <None>   Medication Order Taking? Sig Documenting Provider Last Dose Status Informant  albuterol  (VENTOLIN  HFA) 108 (90 Base) MCG/ACT inhaler [id number]  Inhale 1-2 puffs into the lungs every 6 (six) hours as needed for wheezing or shortness of breath.  Patient not taking: Reported on 11/23/2024   [name] [name], MD  Consider Medication Status and Discontinue   Albuterol -Budesonide (AIRSUPRA ) 90-80 MCG/ACT AERO [id number]  Inhale 2 puffs into the lungs every 6 (six) hours as needed (Shortness of breath or wheezing).  Patient not taking: Reported on 11/23/2024   [name] [name], MD  Consider Medication Status and Discontinue   amLODipine  (NORVASC ) 10 MG tablet [id number] Yes Take 1 tablet (10  mg  total) by mouth daily. [name] [name], MD  Active   cephALEXin  (KEFLEX ) 500 MG capsule [id number] Yes Take 1 capsule (500 mg total) by mouth 3 (three) times daily. Geroldine Berg, MD  Active   cetirizine  (ZYRTEC ) 10 MG tablet [id number]  Take 1 tablet (10 mg total) by mouth daily.  Patient not taking: Reported on 11/23/2024   [name] [name], MD  Active   fluticasone  (FLONASE ) 50 MCG/ACT nasal spray [id number] Yes Place 2 sprays into both nostrils daily. [name] [name], MD  Active   HYDROcodone -acetaminophen  (NORCO/VICODIN) 5-325 MG tablet [id number] Yes Take 1-2 tablets by mouth every 6 (six) hours as needed. Geroldine Berg, MD  Active   levothyroxine  (SYNTHROID ) 25 MCG tablet [id number]  Take 1 tablet (25 mcg total) by mouth daily.  Patient not taking: Reported on 11/23/2024   [name] [name], MD  Active   meclizine  (ANTIVERT ) 25 MG tablet [id number]  Take 1 tablet (25 mg total) by mouth 3 (three) times daily as needed for dizziness.  Patient not taking: Reported on 11/23/2024   [name] [name], MD  Active   ondansetron  (ZOFRAN ) 4 MG tablet [id number]  Take 1 tablet (4 mg total) by mouth every 8 (eight) hours as needed for nausea or vomiting.  Patient not taking: Reported on 11/23/2024   [name] [name], MD  Active   pantoprazole  (PROTONIX ) 40 MG tablet [id number]  Take 1 tablet (40 mg total) by mouth daily.  Patient not taking: Reported on 11/23/2024   [name] [name], MD  Active   rizatriptan  (MAXALT ) 10 MG tablet [id number]  Take 1 tablet (10 mg total) by mouth as needed for migraine. May repeat in 2 hours if needed  Patient not taking: Reported on 11/23/2024   [name] [name], MD  Active   Tiotropium Bromide -Olodaterol 2.5-2.5 MCG/ACT AERS [id number]  Inhale 2 puffs into the lungs daily.  Patient not taking: Reported on 11/23/2024   Del Orbe Polanco, Iliana, FNP  Active   tirzepatide  (ZEPBOUND ) 2.5 MG/0.5ML injection vial [id number]  Inject 2.5 mg into the skin once a week.  Patient not taking:  Reported on 11/23/2024   [name] [name], MD  Active             Recommendation:   Continue Current Plan of Care  Follow Up Plan:   Telephone follow-up in 1 month  12-24-2024 at 9:00 AM

## 2024-11-26 ENCOUNTER — Telehealth: Payer: Self-pay

## 2024-11-26 NOTE — Patient Instructions (Signed)
 Destiny Petty - I am sorry I was unable to reach you today for our scheduled appointment. I work with [name], [name], MD and am calling to support your healthcare needs. Please contact me at [phone] at your earliest convenience. I look forward to speaking with you soon.   Thank you,  Tillman Gardener, BSW [place]  [facility], [facility] Social Worker Direct Dial: [phone]  Fax: [phone] Website: [email or web address]

## 2024-11-30 ENCOUNTER — Other Ambulatory Visit: Payer: Self-pay

## 2024-11-30 NOTE — Patient Outreach (Signed)
 Social Drivers of Health  Community Resource and Care Coordination Visit Note   11/30/2024  Name: Destiny Petty MRN: [id number] DOB:[birth year]  Situation: Referral received for [facility] needs assessment and assistance related to Housing  Financial Strain . I obtained verbal consent from Patient.  Visit completed with Parent on the phone.   Background:   SDOH Interventions Today    Flowsheet Row Most Recent Value  SDOH Interventions   Housing Interventions --  [Pt is working with [facility]. [employer] contacted mortgage company and is providing support. Pt is very grateful. Mortgage in 2 months behind. Pt has a more realistic payment plan.]  Financial Strain Interventions Other (Comment)  [Pt is working with [employer] to manage budget and debit.]     Assessment:   Goals Addressed             This Visit's Progress    BSW Goals       Current SDOH Barriers:  Financial constraints related to debit Limited access to food  Interventions: Patient interviewed and appropriate screenings performed Patient is working with Renetta at [employer] and is very pleased with the support. [facility] is working with the mortgage on the past due amount.  Patient now feels she will be able to manage. Patient is awaiting a reply from the mortgage company now that they have all the paperwork.             Recommendation:   follow up with [facility] regarding housing needs  Follow Up Plan:   Telephone follow up appointment date/time:  12/22/24 at 11am  Tillman Gardener, BSW [place]  [facility], [facility] Social Worker Direct Dial: [phone]  Fax: [phone] Website: [email or web address]

## 2024-11-30 NOTE — Patient Instructions (Signed)
 Visit Information  Thank you for taking time to visit with me today. Please don't hesitate to contact me if I can be of assistance to you before our next scheduled appointment.  Your next care management appointment is by telephone on 12/22/24 at 11am  Please call the care guide team at [phone] if you need to cancel, schedule, or reschedule an appointment.   Please call 911 if you are experiencing a Mental Health or Behavioral Health Crisis or need someone to talk to.  Tillman Gardener, BSW [place]  [facility], [facility] Social Worker Direct Dial: [phone]  Fax: [phone] Website: [email or web address]

## 2024-12-05 ENCOUNTER — Other Ambulatory Visit: Payer: Self-pay

## 2024-12-05 ENCOUNTER — Encounter (HOSPITAL_COMMUNITY): Payer: Self-pay

## 2024-12-05 ENCOUNTER — Emergency Department (HOSPITAL_COMMUNITY)
Admission: EM | Admit: 2024-12-05 | Discharge: 2024-12-05 | Disposition: A | Discharge status: Home / Self Care | Attending: Emergency Medicine | Admitting: Emergency Medicine

## 2024-12-05 ENCOUNTER — Emergency Department (HOSPITAL_COMMUNITY)

## 2024-12-05 DIAGNOSIS — M79672 Pain in left foot: Secondary | ICD-10-CM | POA: Diagnosis present

## 2024-12-05 DIAGNOSIS — M7989 Other specified soft tissue disorders: Secondary | ICD-10-CM | POA: Insufficient documentation

## 2024-12-05 DIAGNOSIS — Z79899 Other long term (current) drug therapy: Secondary | ICD-10-CM | POA: Insufficient documentation

## 2024-12-05 DIAGNOSIS — Z91040 Latex allergy status: Secondary | ICD-10-CM | POA: Insufficient documentation

## 2024-12-05 DIAGNOSIS — R229 Localized swelling, mass and lump, unspecified: Secondary | ICD-10-CM

## 2024-12-05 MED ORDER — DICLOFENAC SODIUM 75 MG PO TBEC: 75.0000 mg | DELAYED_RELEASE_TABLET | Freq: Two times a day (BID) | ORAL | 0 refills | Status: AC

## 2024-12-05 NOTE — ED Triage Notes (Addendum)
 Pt presents with ongoing L foot pain. She was seen in the ED on 12/11 after a chair fell on her foot. She had negative XR at that time. She has not had any outpatient follow up. She has not taken anything for the pain today.

## 2024-12-05 NOTE — ED Provider Notes (Signed)
 " [place] AT [facility] Provider Note   CSN: [id number] Arrival date & time: 12/05/24  1220     Patient presents with: Foot Pain   Destiny Petty is a 43 y.o. female presenting for further evaluation of her left foot which continues to be very painful and has localized swelling on the dorsum of the foot since a chair fell on her on December 11.  She was seen here at that time and plain film imaging was negative for bony injuries.  She reports the pain worsens throughout the course of the day as does the swelling.  She works a job where she has to be on her feet a lot, has elevated the foot and applied ice in the evenings but continues to have significant symptoms.  She denies weakness or numbness distal to the injury site.  She has had no treatments prior to arrival and has not yet followed up with orthopedics for this injury.   The history is provided by the patient.       Prior to Admission medications  Medication Sig Start Date End Date Taking? Authorizing Provider  diclofenac  (VOLTAREN ) 75 MG EC tablet Take 1 tablet (75 mg total) by mouth 2 (two) times daily. 12/05/24  Yes Elis Rawlinson, Mliss, PA-C  albuterol  (VENTOLIN  HFA) 108 (90 Base) MCG/ACT inhaler Inhale 1-2 puffs into the lungs every 6 (six) hours as needed for wheezing or shortness of breath. Patient not taking: Reported on 11/23/2024 10/18/24   [name] [name], MD  Albuterol -Budesonide (AIRSUPRA ) 90-80 MCG/ACT AERO Inhale 2 puffs into the lungs every 6 (six) hours as needed (Shortness of breath or wheezing). Patient not taking: Reported on 11/23/2024 06/18/24   [name] [name], MD  amLODipine  (NORVASC ) 10 MG tablet Take 1 tablet (10 mg total) by mouth daily. 06/18/24   [name] [name], MD  cephALEXin  (KEFLEX ) 500 MG capsule Take 1 capsule (500 mg total) by mouth 3 (three) times daily. 11/19/24   Geroldine Berg, MD  cetirizine  (ZYRTEC ) 10 MG tablet Take 1 tablet (10 mg total) by mouth  daily. Patient not taking: Reported on 11/23/2024 10/18/24   [name] [name], MD  fluticasone  (FLONASE ) 50 MCG/ACT nasal spray Place 2 sprays into both nostrils daily. 10/18/24   [name] [name], MD  HYDROcodone -acetaminophen  (NORCO/VICODIN) 5-325 MG tablet Take 1-2 tablets by mouth every 6 (six) hours as needed. 11/19/24   Geroldine Berg, MD  levothyroxine  (SYNTHROID ) 25 MCG tablet Take 1 tablet (25 mcg total) by mouth daily. Patient not taking: Reported on 11/23/2024 10/22/24   [name] [name], MD  meclizine  (ANTIVERT ) 25 MG tablet Take 1 tablet (25 mg total) by mouth 3 (three) times daily as needed for dizziness. Patient not taking: Reported on 11/23/2024 10/18/24   [name] [name], MD  ondansetron  (ZOFRAN ) 4 MG tablet Take 1 tablet (4 mg total) by mouth every 8 (eight) hours as needed for nausea or vomiting. Patient not taking: Reported on 11/23/2024 10/18/24   [name] [name], MD  pantoprazole  (PROTONIX ) 40 MG tablet Take 1 tablet (40 mg total) by mouth daily. Patient not taking: Reported on 11/23/2024 10/18/24   [name] [name], MD  rizatriptan  (MAXALT ) 10 MG tablet Take 1 tablet (10 mg total) by mouth as needed for migraine. May repeat in 2 hours if needed Patient not taking: Reported on 11/23/2024 10/18/24   [name] [name], MD  Tiotropium Bromide -Olodaterol 2.5-2.5 MCG/ACT AERS Inhale 2 puffs into the lungs daily. Patient not taking:  Reported on 11/23/2024 03/21/23   Del Orbe Polanco, Iliana, FNP  tirzepatide  (ZEPBOUND ) 2.5 MG/0.5ML injection vial Inject 2.5 mg into the skin once a week. Patient not taking: Reported on 11/23/2024 10/18/24   [name] [name], MD    Allergies: Bee venom, Latex, and Lisinopril    Review of Systems  Constitutional:  Negative for fever.  Musculoskeletal:  Positive for arthralgias and joint swelling. Negative for myalgias.  Skin:  Negative for color change and wound.  Neurological:  Negative for weakness and numbness.    Updated Vital Signs BP 118/66  (BP Location: Right Arm)   Pulse 85   Temp 98.4 F (36.9 C) (Oral)   Resp 18   Ht 5' (1.524 m)   Wt 90.7 kg   SpO2 98%   BMI 39.06 kg/m   Physical Exam Constitutional:      Appearance: She is well-developed.  HENT:     Head: Atraumatic.  Cardiovascular:     Comments: Pulses equal bilaterally Musculoskeletal:        General: Swelling and tenderness present.     Cervical back: Normal range of motion.     Left foot: Normal capillary refill. Swelling and bony tenderness present. No deformity. Normal pulse.     Comments: Localized tender edema dorsal midfoot.  Skin is intact, no erythema.  Distal sensation is intact.  She can flex and extend her toes but this does increase her pain at the swollen location.  No palpable or visible bony deformities.  Ankle is nontender.  Skin:    General: Skin is warm and dry.  Neurological:     Mental Status: She is alert.     Sensory: No sensory deficit.     Motor: No weakness.     Deep Tendon Reflexes: Reflexes normal.     (all labs ordered are listed, but only abnormal results are displayed) Labs Reviewed - No data to display  EKG: None  Radiology: DG Foot Complete Left Result Date: 12/05/2024 CLINICAL DATA:  Persistent pain and swelling since crush injury 12/11 EXAM: LEFT FOOT - COMPLETE 3+ VIEW COMPARISON:  Radiograph 11/18/2024 FINDINGS: No acute or evidence of healing fracture. The alignment is normal. Joint spaces are preserved. Tiny plantar calcaneal spur. No erosive or bony destructive change. Persistent soft tissue edema overlies the dorsum of the foot. IMPRESSION: Persistent soft tissue edema. No acute or healing fracture. Electronically Signed   By: Andrea Gasman M.D.   On: 12/05/2024 17:35     Procedures   Medications Ordered in the ED - No data to display                                  Medical Decision Making Patient presents for reevaluation from a crush like injury to her left foot occurring on 1211 with  persistent pain and swelling.  She has had no treatment prior to arrival, she is not taking any anti-inflammatories but has been using ice when the swelling intensifies.  Presents wearing a slipper, stating other shoes worsen her pain.  Repeat imaging was obtained to rule out occult fracture and it is negative today.  Suspect she has a soft tissue hematoma, also discussed possible tendon injury given her increased pain when she ranges her toes.  Discussed home treatment including alternating ice and heat, she was placed on anti-inflammatory and placed in a postop shoe.  She was strongly encouraged to follow-up with orthopedics  and was given a referral for this.  Amount and/or Complexity of Data Reviewed Radiology: ordered.    Details: Foot x-ray reviewed, no occult fracture, no evidence for healing injuries, there is soft tissue edema present.  Risk Prescription drug management.        Final diagnoses:  Foot pain, left  Soft tissue swelling    ED Discharge Orders          Ordered    diclofenac  (VOLTAREN ) 75 MG EC tablet  2 times daily        12/05/24 1802               Cloyde Oregel, PA-C 12/07/24 9048  "

## 2024-12-05 NOTE — Discharge Instructions (Signed)
 Your x-ray is once again negative, with no evidence of having an occult fracture.  However with the persistent swelling I suspect you may have a soft tissue injury, possibly a tendon or a ligament injury.  You have been placed in a postop shoe to help better support your foot and protect it.  I do recommend using elevation and ice is much as is comfortable to, especially after a day in your feet.  You may also intermittently apply a heating pad carefully, not more than 10 to 15 minutes several times daily.  I have prescribed you an anti-inflammatory pain reliever as well.  Call one of the orthopedists listed for further evaluation and management of this persistent pain and swelling.

## 2024-12-16 ENCOUNTER — Telehealth: Payer: Self-pay

## 2024-12-16 ENCOUNTER — Other Ambulatory Visit: Payer: Self-pay

## 2024-12-16 NOTE — Progress Notes (Signed)
" ° °  12/16/2024  Patient ID: Destiny Petty, female   DOB: Dec 26, 1980, 44 y.o.   MRN: [id number]  Attempted to reach patient regarding medication related cost issues reported by Hendricks, RN. Had scheduled visit over phone 9am 12/17/23. No answer, lvm.   I did review costs of medications through test claim at [facility] and gathered the following and want to make sure this would be comparable to current fills at apothecary. Would happy to discuss further with patient should she call back.       Lang Sieve, PharmD, BCGP Clinical Pharmacist  [phone]  "

## 2024-12-16 NOTE — Progress Notes (Unsigned)
" ° °  12/16/2024 Name: Destiny Petty MRN: [id number] DOB: October 11, 1981  No chief complaint on file.   Destiny Petty is a 44 y.o. year old female who presented for a telephone visit.   They were referred to the pharmacist by their PCP for assistance in managing medication access.    Subjective:  Care Team: Primary Care Provider: [name] [name], MD ; Next Scheduled Visit:  Future Appointments  Date Time Provider Department Pet[facility]  12/16/2024  9:00 AM RPC-PHARMACIST [facility] [address]  12/22/2024 11:00 AM Clemons, Tillman B [facility] None  12/24/2024  9:00 AM Kay Hendricks MATSU, RN [facility] None      Medication Access/Adherence  Current Pharmacy:  [facility] - [place], [place] - [address] [place] [address] Phone: [phone] Fax: [phone]  [facility] [address], [place] - [address] [place] [address] [place] [address] [place] [address] Phone: [phone] Fax: [phone]  [id number] - [place], [place] - [address] AT [facility] [address] [place] [address] Phone: [phone] Fax: [phone]  [facility] [place], [place] - [address] [phone] [place] [address] Phone: [phone] Fax: [phone]   Patient reports affordability concerns with their medications: Ongoing affordability concerns  Patient reports access/transportation concerns to their pharmacy: No  Patient reports adherence concerns with their medications:  Yes  due to cost   Medication Management:  Current adherence strategy:   Patient reports {Good Fair Poor:[phone]} adherence to medications  Patient reports the following barriers to adherence: medication costs   Recent fill dates:    Objective:  Lab Results  Component Value Date   HGBA1C 5.1 10/18/2024    Lab Results  Component Value Date   CREATININE 0.73 10/18/2024   BUN 15 10/18/2024   NA 139  10/18/2024   K 4.2 10/18/2024   CL 105 10/18/2024   CO2 19 (L) 10/18/2024    Lab Results  Component Value Date   CHOL 165 10/18/2024   HDL 38 (L) 10/18/2024   LDLCALC 85 10/18/2024   TRIG 250 (H) 10/18/2024   CHOLHDL 4.3 10/18/2024    Medications Reviewed Today   Medications were not reviewed in this encounter       Assessment/Plan:   Medication Management: - Currently strategy {sufficient/insufficient:26424} to maintain appropriate adherence to prescribed medication regimen - Suggested use of weekly pill box to organize medications - Created list of medication, indication, and administration time. Provided to patient - Discussed collaboration with local pharmacies for adherence packaging. Reviewed local pharmacies with adherence packaging options. Patient elects to ***      Follow Up Plan: ***  ***   "

## 2024-12-22 ENCOUNTER — Telehealth: Payer: Self-pay

## 2024-12-22 NOTE — Patient Instructions (Signed)
 Destiny Petty - I am sorry I was unable to reach you today for our scheduled appointment. I work with [name], [name], MD and am calling to support your healthcare needs. Please contact me at [phone] at your earliest convenience. I look forward to speaking with you soon.   Thank you,  Tillman Gardener, BSW [place]  [facility], [facility] Social Worker Direct Dial: [phone]  Fax: [phone] Website: [email or web address]

## 2024-12-24 ENCOUNTER — Telehealth: Payer: Self-pay

## 2024-12-24 NOTE — Patient Instructions (Signed)
 Destiny Petty - I am sorry I was unable to reach you today for our scheduled appointment. I work with [name], [name], MD and am calling to support your healthcare needs. Please contact me at [phone] at your earliest convenience. I look forward to speaking with you soon.   Thank you,  Hendricks Her RN, BSN  [place] [facility] RN Case Manager   Direct [phone]

## 2024-12-28 ENCOUNTER — Other Ambulatory Visit: Payer: Self-pay
